# Patient Record
Sex: Female | Born: 1950 | Race: White | Hispanic: No | Marital: Single | Smoking: Never smoker
Health system: Southern US, Community
[De-identification: ages and names within clinical notes are randomized; demographics above are authoritative.]

## PROBLEM LIST (undated history)

## (undated) DIAGNOSIS — I2089 Other forms of angina pectoris: Secondary | ICD-10-CM

## (undated) DIAGNOSIS — R1900 Intra-abdominal and pelvic swelling, mass and lump, unspecified site: Secondary | ICD-10-CM

## (undated) DIAGNOSIS — M5030 Other cervical disc degeneration, unspecified cervical region: Secondary | ICD-10-CM

## (undated) DIAGNOSIS — Z9289 Personal history of other medical treatment: Secondary | ICD-10-CM

## (undated) DIAGNOSIS — N189 Chronic kidney disease, unspecified: Secondary | ICD-10-CM

## (undated) DIAGNOSIS — F419 Anxiety disorder, unspecified: Secondary | ICD-10-CM

## (undated) DIAGNOSIS — J189 Pneumonia, unspecified organism: Secondary | ICD-10-CM

## (undated) DIAGNOSIS — Z8673 Personal history of transient ischemic attack (TIA), and cerebral infarction without residual deficits: Secondary | ICD-10-CM

## (undated) DIAGNOSIS — J45909 Unspecified asthma, uncomplicated: Secondary | ICD-10-CM

## (undated) DIAGNOSIS — R0600 Dyspnea, unspecified: Secondary | ICD-10-CM

## (undated) DIAGNOSIS — K219 Gastro-esophageal reflux disease without esophagitis: Secondary | ICD-10-CM

## (undated) DIAGNOSIS — I209 Angina pectoris, unspecified: Secondary | ICD-10-CM

## (undated) DIAGNOSIS — R112 Nausea with vomiting, unspecified: Secondary | ICD-10-CM

## (undated) DIAGNOSIS — K449 Diaphragmatic hernia without obstruction or gangrene: Secondary | ICD-10-CM

## (undated) DIAGNOSIS — F32A Depression, unspecified: Secondary | ICD-10-CM

## (undated) DIAGNOSIS — I10 Essential (primary) hypertension: Secondary | ICD-10-CM

## (undated) DIAGNOSIS — J302 Other seasonal allergic rhinitis: Secondary | ICD-10-CM

## (undated) DIAGNOSIS — J329 Chronic sinusitis, unspecified: Secondary | ICD-10-CM

## (undated) DIAGNOSIS — F329 Major depressive disorder, single episode, unspecified: Secondary | ICD-10-CM

## (undated) DIAGNOSIS — M109 Gout, unspecified: Secondary | ICD-10-CM

## (undated) DIAGNOSIS — G894 Chronic pain syndrome: Secondary | ICD-10-CM

## (undated) DIAGNOSIS — Z9889 Other specified postprocedural states: Secondary | ICD-10-CM

## (undated) DIAGNOSIS — I639 Cerebral infarction, unspecified: Secondary | ICD-10-CM

## (undated) DIAGNOSIS — T7840XA Allergy, unspecified, initial encounter: Secondary | ICD-10-CM

## (undated) DIAGNOSIS — I208 Other forms of angina pectoris: Secondary | ICD-10-CM

## (undated) DIAGNOSIS — N289 Disorder of kidney and ureter, unspecified: Secondary | ICD-10-CM

## (undated) HISTORY — PX: TUBAL LIGATION: SHX77

## (undated) HISTORY — DX: Gout, unspecified: M10.9

## (undated) HISTORY — DX: Chronic sinusitis, unspecified: J32.9

## (undated) HISTORY — DX: Depression, unspecified: F32.A

## (undated) HISTORY — DX: Gastro-esophageal reflux disease without esophagitis: K21.9

## (undated) HISTORY — PX: BREAST SURGERY: SHX581

## (undated) HISTORY — DX: Chronic kidney disease, unspecified: N18.9

## (undated) HISTORY — PX: TONSILLECTOMY: SUR1361

## (undated) HISTORY — DX: Other seasonal allergic rhinitis: J30.2

## (undated) HISTORY — DX: Allergy, unspecified, initial encounter: T78.40XA

## (undated) HISTORY — DX: Other cervical disc degeneration, unspecified cervical region: M50.30

## (undated) HISTORY — PX: TONSILLECTOMY AND ADENOIDECTOMY: SUR1326

## (undated) HISTORY — DX: Chronic pain syndrome: G89.4

## (undated) HISTORY — DX: Anxiety disorder, unspecified: F41.9

## (undated) HISTORY — DX: Unspecified asthma, uncomplicated: J45.909

## (undated) HISTORY — PX: ARM DEBRIDEMENT: SHX890

## (undated) HISTORY — DX: Essential (primary) hypertension: I10

## (undated) HISTORY — DX: Personal history of transient ischemic attack (TIA), and cerebral infarction without residual deficits: Z86.73

## (undated) HISTORY — PX: APPENDECTOMY: SHX54

## (undated) HISTORY — DX: Major depressive disorder, single episode, unspecified: F32.9

---

## 1996-05-08 DIAGNOSIS — I10 Essential (primary) hypertension: Secondary | ICD-10-CM

## 1996-05-08 DIAGNOSIS — E119 Type 2 diabetes mellitus without complications: Secondary | ICD-10-CM

## 1996-05-08 HISTORY — DX: Essential (primary) hypertension: I10

## 1996-05-08 HISTORY — DX: Type 2 diabetes mellitus without complications: E11.9

## 1999-03-21 ENCOUNTER — Other Ambulatory Visit: Admission: RE | Admit: 1999-03-21 | Discharge: 1999-03-21 | Payer: Self-pay | Admitting: Family Medicine

## 1999-04-18 ENCOUNTER — Encounter: Payer: Self-pay | Admitting: Family Medicine

## 1999-04-18 ENCOUNTER — Ambulatory Visit (HOSPITAL_COMMUNITY): Admission: RE | Admit: 1999-04-18 | Discharge: 1999-04-18 | Payer: Self-pay | Admitting: Family Medicine

## 1999-11-03 ENCOUNTER — Encounter: Payer: Self-pay | Admitting: Cardiology

## 1999-11-03 ENCOUNTER — Ambulatory Visit (HOSPITAL_COMMUNITY): Admission: RE | Admit: 1999-11-03 | Discharge: 1999-11-03 | Payer: Self-pay | Admitting: Cardiology

## 2000-08-14 ENCOUNTER — Encounter: Admission: RE | Admit: 2000-08-14 | Discharge: 2000-11-12 | Payer: Self-pay | Admitting: Cardiology

## 2000-10-12 ENCOUNTER — Encounter: Payer: Self-pay | Admitting: Cardiology

## 2000-10-12 ENCOUNTER — Encounter: Admission: RE | Admit: 2000-10-12 | Discharge: 2000-10-12 | Payer: Self-pay | Admitting: Cardiology

## 2001-01-04 ENCOUNTER — Other Ambulatory Visit: Admission: RE | Admit: 2001-01-04 | Discharge: 2001-01-04 | Payer: Self-pay | Admitting: Obstetrics and Gynecology

## 2001-07-07 ENCOUNTER — Emergency Department (HOSPITAL_COMMUNITY): Admission: EM | Admit: 2001-07-07 | Discharge: 2001-07-07 | Payer: Self-pay | Admitting: Emergency Medicine

## 2001-07-07 ENCOUNTER — Encounter: Payer: Self-pay | Admitting: Emergency Medicine

## 2002-10-17 ENCOUNTER — Emergency Department (HOSPITAL_COMMUNITY): Admission: EM | Admit: 2002-10-17 | Discharge: 2002-10-17 | Payer: Self-pay | Admitting: Emergency Medicine

## 2002-11-11 ENCOUNTER — Emergency Department (HOSPITAL_COMMUNITY): Admission: EM | Admit: 2002-11-11 | Discharge: 2002-11-11 | Payer: Self-pay | Admitting: Emergency Medicine

## 2003-07-27 ENCOUNTER — Emergency Department (HOSPITAL_COMMUNITY): Admission: AD | Admit: 2003-07-27 | Discharge: 2003-07-27 | Payer: Self-pay | Admitting: Family Medicine

## 2003-12-20 ENCOUNTER — Emergency Department (HOSPITAL_COMMUNITY): Admission: EM | Admit: 2003-12-20 | Discharge: 2003-12-20 | Payer: Self-pay | Admitting: *Deleted

## 2005-05-29 ENCOUNTER — Inpatient Hospital Stay (HOSPITAL_COMMUNITY): Admission: EM | Admit: 2005-05-29 | Discharge: 2005-06-06 | Payer: Self-pay | Admitting: Emergency Medicine

## 2005-06-08 DIAGNOSIS — Z8719 Personal history of other diseases of the digestive system: Secondary | ICD-10-CM

## 2005-06-08 HISTORY — DX: Personal history of other diseases of the digestive system: Z87.19

## 2005-06-21 ENCOUNTER — Ambulatory Visit (HOSPITAL_COMMUNITY): Admission: RE | Admit: 2005-06-21 | Discharge: 2005-06-21 | Payer: Self-pay | Admitting: Cardiology

## 2005-07-06 ENCOUNTER — Ambulatory Visit: Payer: Self-pay | Admitting: Internal Medicine

## 2005-07-07 ENCOUNTER — Ambulatory Visit: Payer: Self-pay | Admitting: *Deleted

## 2005-08-21 ENCOUNTER — Encounter: Payer: Self-pay | Admitting: Internal Medicine

## 2005-08-21 ENCOUNTER — Encounter (INDEPENDENT_AMBULATORY_CARE_PROVIDER_SITE_OTHER): Payer: Self-pay | Admitting: Internal Medicine

## 2005-08-21 ENCOUNTER — Ambulatory Visit: Payer: Self-pay | Admitting: Internal Medicine

## 2005-08-22 ENCOUNTER — Encounter (INDEPENDENT_AMBULATORY_CARE_PROVIDER_SITE_OTHER): Payer: Self-pay | Admitting: Internal Medicine

## 2005-08-22 LAB — CONVERTED CEMR LAB
BUN: 42 mg/dL
Creatinine, Ser: 2 mg/dL

## 2005-08-29 ENCOUNTER — Ambulatory Visit (HOSPITAL_COMMUNITY): Admission: RE | Admit: 2005-08-29 | Discharge: 2005-08-29 | Payer: Self-pay | Admitting: Family Medicine

## 2005-10-04 ENCOUNTER — Ambulatory Visit (HOSPITAL_COMMUNITY): Admission: RE | Admit: 2005-10-04 | Discharge: 2005-10-04 | Payer: Self-pay | Admitting: Internal Medicine

## 2005-10-04 ENCOUNTER — Ambulatory Visit: Payer: Self-pay | Admitting: Internal Medicine

## 2005-10-13 ENCOUNTER — Ambulatory Visit: Payer: Self-pay | Admitting: Internal Medicine

## 2006-02-21 ENCOUNTER — Encounter (INDEPENDENT_AMBULATORY_CARE_PROVIDER_SITE_OTHER): Payer: Self-pay | Admitting: Internal Medicine

## 2006-02-21 ENCOUNTER — Ambulatory Visit: Payer: Self-pay | Admitting: Family Medicine

## 2006-02-21 DIAGNOSIS — E1149 Type 2 diabetes mellitus with other diabetic neurological complication: Secondary | ICD-10-CM | POA: Insufficient documentation

## 2006-04-12 ENCOUNTER — Ambulatory Visit: Payer: Self-pay | Admitting: Internal Medicine

## 2006-04-23 ENCOUNTER — Ambulatory Visit: Payer: Self-pay | Admitting: Internal Medicine

## 2006-08-09 ENCOUNTER — Ambulatory Visit: Payer: Self-pay | Admitting: Internal Medicine

## 2006-09-07 ENCOUNTER — Ambulatory Visit: Payer: Self-pay | Admitting: Internal Medicine

## 2006-09-18 ENCOUNTER — Ambulatory Visit: Payer: Self-pay | Admitting: Internal Medicine

## 2006-09-25 ENCOUNTER — Ambulatory Visit: Payer: Self-pay | Admitting: Internal Medicine

## 2006-10-12 ENCOUNTER — Ambulatory Visit: Payer: Self-pay | Admitting: Internal Medicine

## 2006-10-15 ENCOUNTER — Ambulatory Visit: Payer: Self-pay | Admitting: Internal Medicine

## 2006-10-16 ENCOUNTER — Ambulatory Visit: Payer: Self-pay | Admitting: Internal Medicine

## 2006-10-26 ENCOUNTER — Ambulatory Visit: Payer: Self-pay | Admitting: Internal Medicine

## 2006-11-02 ENCOUNTER — Ambulatory Visit (HOSPITAL_COMMUNITY): Admission: RE | Admit: 2006-11-02 | Discharge: 2006-11-02 | Payer: Self-pay | Admitting: Internal Medicine

## 2006-11-20 ENCOUNTER — Encounter (INDEPENDENT_AMBULATORY_CARE_PROVIDER_SITE_OTHER): Payer: Self-pay | Admitting: Internal Medicine

## 2006-11-20 DIAGNOSIS — E119 Type 2 diabetes mellitus without complications: Secondary | ICD-10-CM | POA: Insufficient documentation

## 2006-11-20 DIAGNOSIS — J309 Allergic rhinitis, unspecified: Secondary | ICD-10-CM | POA: Insufficient documentation

## 2006-11-20 DIAGNOSIS — E785 Hyperlipidemia, unspecified: Secondary | ICD-10-CM | POA: Insufficient documentation

## 2006-11-20 DIAGNOSIS — M1990 Unspecified osteoarthritis, unspecified site: Secondary | ICD-10-CM | POA: Insufficient documentation

## 2006-11-20 DIAGNOSIS — F411 Generalized anxiety disorder: Secondary | ICD-10-CM | POA: Insufficient documentation

## 2006-11-20 DIAGNOSIS — M1710 Unilateral primary osteoarthritis, unspecified knee: Secondary | ICD-10-CM

## 2006-11-20 DIAGNOSIS — N189 Chronic kidney disease, unspecified: Secondary | ICD-10-CM | POA: Insufficient documentation

## 2006-11-20 LAB — CONVERTED CEMR LAB: Cholesterol: 195 mg/dL

## 2006-11-27 ENCOUNTER — Ambulatory Visit: Payer: Self-pay | Admitting: Internal Medicine

## 2006-11-27 LAB — CONVERTED CEMR LAB: Hgb A1c MFr Bld: 6.4 %

## 2006-12-19 ENCOUNTER — Ambulatory Visit: Payer: Self-pay | Admitting: Family Medicine

## 2007-01-08 ENCOUNTER — Ambulatory Visit: Payer: Self-pay | Admitting: Internal Medicine

## 2007-01-08 LAB — CONVERTED CEMR LAB
ALT: 14 units/L (ref 0–35)
AST: 17 units/L (ref 0–37)
Albumin: 4.9 g/dL (ref 3.5–5.2)
Alkaline Phosphatase: 48 units/L (ref 39–117)
Cholesterol: 185 mg/dL (ref 0–200)
HDL: 50 mg/dL (ref >39)
Total Protein: 7.8 g/dL (ref 6.0–8.3)
Triglycerides: 114 mg/dL (ref <150)

## 2007-01-15 ENCOUNTER — Encounter (INDEPENDENT_AMBULATORY_CARE_PROVIDER_SITE_OTHER): Payer: Self-pay | Admitting: Internal Medicine

## 2007-01-23 ENCOUNTER — Telehealth (INDEPENDENT_AMBULATORY_CARE_PROVIDER_SITE_OTHER): Payer: Self-pay | Admitting: *Deleted

## 2007-01-23 ENCOUNTER — Encounter (INDEPENDENT_AMBULATORY_CARE_PROVIDER_SITE_OTHER): Payer: Self-pay | Admitting: *Deleted

## 2007-02-04 ENCOUNTER — Telehealth (INDEPENDENT_AMBULATORY_CARE_PROVIDER_SITE_OTHER): Payer: Self-pay | Admitting: Internal Medicine

## 2007-02-05 ENCOUNTER — Emergency Department (HOSPITAL_COMMUNITY): Admission: EM | Admit: 2007-02-05 | Discharge: 2007-02-05 | Payer: Self-pay | Admitting: Emergency Medicine

## 2007-03-04 ENCOUNTER — Telehealth (INDEPENDENT_AMBULATORY_CARE_PROVIDER_SITE_OTHER): Payer: Self-pay | Admitting: *Deleted

## 2007-03-05 ENCOUNTER — Ambulatory Visit: Payer: Self-pay | Admitting: Internal Medicine

## 2007-03-05 LAB — CONVERTED CEMR LAB: Blood Glucose, Fingerstick: 92

## 2007-03-17 ENCOUNTER — Emergency Department (HOSPITAL_COMMUNITY): Admission: EM | Admit: 2007-03-17 | Discharge: 2007-03-17 | Payer: Self-pay | Admitting: Emergency Medicine

## 2007-04-16 ENCOUNTER — Ambulatory Visit: Payer: Self-pay | Admitting: Internal Medicine

## 2007-04-16 DIAGNOSIS — N390 Urinary tract infection, site not specified: Secondary | ICD-10-CM | POA: Insufficient documentation

## 2007-04-16 LAB — CONVERTED CEMR LAB
Bilirubin Urine: NEGATIVE
Blood in Urine, dipstick: NEGATIVE
Glucose, Urine, Semiquant: NEGATIVE
Ketones, urine, test strip: NEGATIVE
Urobilinogen, UA: NEGATIVE
pH: 5.5

## 2007-04-17 ENCOUNTER — Encounter (INDEPENDENT_AMBULATORY_CARE_PROVIDER_SITE_OTHER): Payer: Self-pay | Admitting: Internal Medicine

## 2007-04-24 ENCOUNTER — Encounter: Admission: RE | Admit: 2007-04-24 | Discharge: 2007-04-25 | Payer: Self-pay | Admitting: Occupational Medicine

## 2007-04-25 ENCOUNTER — Ambulatory Visit: Payer: Self-pay | Admitting: Internal Medicine

## 2007-04-25 LAB — CONVERTED CEMR LAB: Blood Glucose, Fingerstick: 112

## 2007-05-10 ENCOUNTER — Encounter: Admission: RE | Admit: 2007-05-10 | Discharge: 2007-07-09 | Payer: Self-pay | Admitting: Occupational Medicine

## 2007-05-13 ENCOUNTER — Telehealth (INDEPENDENT_AMBULATORY_CARE_PROVIDER_SITE_OTHER): Payer: Self-pay | Admitting: Nurse Practitioner

## 2007-05-17 ENCOUNTER — Encounter (INDEPENDENT_AMBULATORY_CARE_PROVIDER_SITE_OTHER): Payer: Self-pay | Admitting: Internal Medicine

## 2007-05-30 ENCOUNTER — Telehealth (INDEPENDENT_AMBULATORY_CARE_PROVIDER_SITE_OTHER): Payer: Self-pay | Admitting: Internal Medicine

## 2007-06-13 ENCOUNTER — Ambulatory Visit: Payer: Self-pay | Admitting: Internal Medicine

## 2007-06-13 DIAGNOSIS — M5380 Other specified dorsopathies, site unspecified: Secondary | ICD-10-CM | POA: Insufficient documentation

## 2007-06-13 LAB — CONVERTED CEMR LAB
Bilirubin Urine: NEGATIVE
Blood Glucose, Fingerstick: 107
Glucose, Urine, Semiquant: NEGATIVE
Hgb A1c MFr Bld: 6.1 %
WBC Urine, dipstick: NEGATIVE
pH: 5

## 2007-07-26 ENCOUNTER — Encounter (INDEPENDENT_AMBULATORY_CARE_PROVIDER_SITE_OTHER): Payer: Self-pay | Admitting: Internal Medicine

## 2007-07-26 ENCOUNTER — Ambulatory Visit: Payer: Self-pay | Admitting: Internal Medicine

## 2007-07-26 DIAGNOSIS — R5383 Other fatigue: Secondary | ICD-10-CM

## 2007-07-26 DIAGNOSIS — I10 Essential (primary) hypertension: Secondary | ICD-10-CM | POA: Insufficient documentation

## 2007-07-26 DIAGNOSIS — R5381 Other malaise: Secondary | ICD-10-CM | POA: Insufficient documentation

## 2007-07-26 LAB — CONVERTED CEMR LAB
Blood Glucose, Fingerstick: 157
Nitrite: NEGATIVE
Specific Gravity, Urine: 1.02
Urobilinogen, UA: NEGATIVE
pH: 5

## 2007-07-27 ENCOUNTER — Encounter (INDEPENDENT_AMBULATORY_CARE_PROVIDER_SITE_OTHER): Payer: Self-pay | Admitting: Internal Medicine

## 2007-07-30 LAB — CONVERTED CEMR LAB
ALT: 17 units/L (ref 0–35)
Albumin: 4.5 g/dL (ref 3.5–5.2)
Alkaline Phosphatase: 45 units/L (ref 39–117)
Calcium: 9.6 mg/dL (ref 8.4–10.5)
Chloride: 105 meq/L (ref 96–112)
Eosinophils Absolute: 0.2 10*3/uL (ref 0.0–0.7)
Lymphocytes Relative: 33 % (ref 12–46)
Lymphs Abs: 2 10*3/uL (ref 0.7–4.0)
Monocytes Relative: 8 % (ref 3–12)
Neutrophils Relative %: 56 % (ref 43–77)
Platelets: 278 10*3/uL (ref 150–400)
Potassium: 6.1 meq/L — ABNORMAL HIGH (ref 3.5–5.3)
RBC: 4.5 M/uL (ref 3.87–5.11)
RDW: 13.8 % (ref 11.5–15.5)
Total Bilirubin: 0.4 mg/dL (ref 0.3–1.2)
Total Protein: 7 g/dL (ref 6.0–8.3)

## 2007-07-31 ENCOUNTER — Ambulatory Visit: Payer: Self-pay | Admitting: Internal Medicine

## 2007-08-01 ENCOUNTER — Encounter (INDEPENDENT_AMBULATORY_CARE_PROVIDER_SITE_OTHER): Payer: Self-pay | Admitting: Internal Medicine

## 2007-08-01 LAB — CONVERTED CEMR LAB: Potassium: 5.6 meq/L — ABNORMAL HIGH (ref 3.5–5.3)

## 2007-08-05 ENCOUNTER — Encounter
Admission: RE | Admit: 2007-08-05 | Discharge: 2007-08-05 | Payer: Self-pay | Admitting: Physical Medicine and Rehabilitation

## 2007-09-17 ENCOUNTER — Ambulatory Visit: Payer: Self-pay | Admitting: Internal Medicine

## 2007-11-22 ENCOUNTER — Ambulatory Visit: Payer: Self-pay | Admitting: Internal Medicine

## 2007-11-22 DIAGNOSIS — K029 Dental caries, unspecified: Secondary | ICD-10-CM | POA: Insufficient documentation

## 2007-11-22 LAB — CONVERTED CEMR LAB: Blood Glucose, Fingerstick: 97

## 2007-12-04 ENCOUNTER — Encounter (INDEPENDENT_AMBULATORY_CARE_PROVIDER_SITE_OTHER): Payer: Self-pay | Admitting: Internal Medicine

## 2007-12-09 ENCOUNTER — Encounter (INDEPENDENT_AMBULATORY_CARE_PROVIDER_SITE_OTHER): Payer: Self-pay | Admitting: Internal Medicine

## 2008-01-29 ENCOUNTER — Ambulatory Visit (HOSPITAL_COMMUNITY): Admission: RE | Admit: 2008-01-29 | Discharge: 2008-01-29 | Payer: Self-pay | Admitting: Internal Medicine

## 2008-03-09 ENCOUNTER — Ambulatory Visit: Payer: Self-pay | Admitting: Women's Health

## 2008-08-20 ENCOUNTER — Emergency Department (HOSPITAL_COMMUNITY): Admission: EM | Admit: 2008-08-20 | Discharge: 2008-08-20 | Payer: Self-pay | Admitting: Emergency Medicine

## 2008-08-21 ENCOUNTER — Inpatient Hospital Stay (HOSPITAL_COMMUNITY): Admission: EM | Admit: 2008-08-21 | Discharge: 2008-08-26 | Payer: Self-pay | Admitting: Emergency Medicine

## 2008-08-21 ENCOUNTER — Emergency Department (HOSPITAL_COMMUNITY): Admission: EM | Admit: 2008-08-21 | Discharge: 2008-08-21 | Payer: Self-pay | Admitting: Emergency Medicine

## 2008-08-22 ENCOUNTER — Ambulatory Visit: Payer: Self-pay | Admitting: Infectious Diseases

## 2008-08-22 ENCOUNTER — Encounter (INDEPENDENT_AMBULATORY_CARE_PROVIDER_SITE_OTHER): Payer: Self-pay | Admitting: Cardiovascular Disease

## 2008-08-24 ENCOUNTER — Ambulatory Visit: Payer: Self-pay | Admitting: *Deleted

## 2008-08-24 ENCOUNTER — Encounter (INDEPENDENT_AMBULATORY_CARE_PROVIDER_SITE_OTHER): Payer: Self-pay | Admitting: Cardiovascular Disease

## 2008-10-02 ENCOUNTER — Other Ambulatory Visit: Admission: RE | Admit: 2008-10-02 | Discharge: 2008-10-02 | Payer: Self-pay | Admitting: Gynecology

## 2008-10-02 ENCOUNTER — Ambulatory Visit: Payer: Self-pay | Admitting: Women's Health

## 2008-10-02 ENCOUNTER — Encounter: Payer: Self-pay | Admitting: Women's Health

## 2009-06-28 ENCOUNTER — Ambulatory Visit: Payer: Self-pay | Admitting: Women's Health

## 2009-08-02 ENCOUNTER — Emergency Department (HOSPITAL_COMMUNITY): Admission: EM | Admit: 2009-08-02 | Discharge: 2009-08-02 | Payer: Self-pay | Admitting: Family Medicine

## 2009-08-30 ENCOUNTER — Encounter: Admission: RE | Admit: 2009-08-30 | Discharge: 2009-08-30 | Payer: Self-pay | Admitting: Gynecology

## 2009-09-03 ENCOUNTER — Encounter: Admission: RE | Admit: 2009-09-03 | Discharge: 2009-09-03 | Payer: Self-pay | Admitting: Gynecology

## 2009-10-15 ENCOUNTER — Other Ambulatory Visit: Admission: RE | Admit: 2009-10-15 | Discharge: 2009-10-15 | Payer: Self-pay | Admitting: Gynecology

## 2009-10-15 ENCOUNTER — Ambulatory Visit: Payer: Self-pay | Admitting: Women's Health

## 2010-01-17 ENCOUNTER — Ambulatory Visit: Payer: Self-pay | Admitting: Gynecology

## 2010-01-24 ENCOUNTER — Ambulatory Visit: Payer: Self-pay | Admitting: Gynecology

## 2010-03-07 ENCOUNTER — Ambulatory Visit: Payer: Self-pay | Admitting: Gynecology

## 2010-05-30 ENCOUNTER — Encounter: Payer: Self-pay | Admitting: Internal Medicine

## 2010-08-17 LAB — POCT I-STAT, CHEM 8
BUN: 21 mg/dL (ref 6–23)
Chloride: 111 mEq/L (ref 96–112)
Sodium: 139 mEq/L (ref 135–145)

## 2010-08-17 LAB — URINALYSIS, ROUTINE W REFLEX MICROSCOPIC
Bilirubin Urine: NEGATIVE
Bilirubin Urine: NEGATIVE
Hgb urine dipstick: NEGATIVE
Ketones, ur: NEGATIVE mg/dL
Nitrite: NEGATIVE
Nitrite: NEGATIVE
Specific Gravity, Urine: 1.016 (ref 1.005–1.030)
Urobilinogen, UA: 0.2 mg/dL (ref 0.0–1.0)
pH: 6.5 (ref 5.0–8.0)

## 2010-08-17 LAB — GLUCOSE, CAPILLARY
Glucose-Capillary: 113 mg/dL — ABNORMAL HIGH (ref 70–99)
Glucose-Capillary: 123 mg/dL — ABNORMAL HIGH (ref 70–99)
Glucose-Capillary: 96 mg/dL (ref 70–99)

## 2010-08-17 LAB — DIFFERENTIAL
Eosinophils Absolute: 0 10*3/uL (ref 0.0–0.7)
Eosinophils Relative: 5 % (ref 0–5)
Eosinophils Relative: 1 % (ref 0–5)
Lymphocytes Relative: 31 % (ref 12–46)
Lymphocytes Relative: 17 % (ref 12–46)
Lymphs Abs: 1.3 10*3/uL (ref 0.7–4.0)
Lymphs Abs: 1.5 10*3/uL (ref 0.7–4.0)
Lymphs Abs: 2.5 10*3/uL (ref 0.7–4.0)
Lymphs Abs: 1.8 10*3/uL (ref 0.7–4.0)
Monocytes Absolute: 0.7 10*3/uL (ref 0.1–1.0)
Monocytes Absolute: 0.8 10*3/uL (ref 0.1–1.0)
Monocytes Absolute: 0.6 10*3/uL (ref 0.1–1.0)
Monocytes Relative: 7 % (ref 3–12)
Monocytes Relative: 9 % (ref 3–12)
Neutro Abs: 9 10*3/uL — ABNORMAL HIGH (ref 1.7–7.7)
Neutro Abs: 9.4 10*3/uL — ABNORMAL HIGH (ref 1.7–7.7)
Neutro Abs: 8 10*3/uL — ABNORMAL HIGH (ref 1.7–7.7)
Neutrophils Relative %: 79 % — ABNORMAL HIGH (ref 43–77)
Neutrophils Relative %: 80 % — ABNORMAL HIGH (ref 43–77)

## 2010-08-17 LAB — COMPREHENSIVE METABOLIC PANEL
ALT: 21 U/L (ref 0–35)
AST: 22 U/L (ref 0–37)
BUN: 21 mg/dL (ref 6–23)
BUN: 27 mg/dL — ABNORMAL HIGH (ref 6–23)
CO2: 24 mEq/L (ref 19–32)
CO2: 26 mEq/L (ref 19–32)
Calcium: 9.3 mg/dL (ref 8.4–10.5)
Calcium: 9.1 mg/dL (ref 8.4–10.5)
Chloride: 110 mEq/L (ref 96–112)
Creatinine, Ser: 1.43 mg/dL — ABNORMAL HIGH (ref 0.4–1.2)
Creatinine, Ser: 1.49 mg/dL — ABNORMAL HIGH (ref 0.4–1.2)
GFR calc Af Amer: 44 mL/min — ABNORMAL LOW (ref >60)
GFR calc non Af Amer: 38 mL/min — ABNORMAL LOW (ref >60)
GFR calc non Af Amer: 36 mL/min — ABNORMAL LOW (ref >60)
Glucose, Bld: 131 mg/dL — ABNORMAL HIGH (ref 70–99)
Sodium: 140 mEq/L (ref 135–145)
Total Bilirubin: 0.6 mg/dL (ref 0.3–1.2)
Total Protein: 6.6 g/dL (ref 6.0–8.3)

## 2010-08-17 LAB — CBC
HCT: 36.6 % (ref 36.0–46.0)
HCT: 37.4 % (ref 36.0–46.0)
HCT: 37.9 % (ref 36.0–46.0)
Hemoglobin: 12.2 g/dL (ref 12.0–15.0)
Hemoglobin: 12.3 g/dL (ref 12.0–15.0)
Hemoglobin: 12.4 g/dL (ref 12.0–15.0)
Hemoglobin: 12.7 g/dL (ref 12.0–15.0)
MCHC: 32.2 g/dL (ref 30.0–36.0)
MCHC: 34.8 g/dL (ref 30.0–36.0)
MCV: 84.7 fL (ref 78.0–100.0)
MCV: 85 fL (ref 78.0–100.0)
MCV: 85.1 fL (ref 78.0–100.0)
MCV: 85.2 fL (ref 78.0–100.0)
MCV: 84.1 fL (ref 78.0–100.0)
Platelets: 263 10*3/uL (ref 150–400)
Platelets: 264 10*3/uL (ref 150–400)
RBC: 4.27 MIL/uL (ref 3.87–5.11)
RBC: 4.4 MIL/uL (ref 3.87–5.11)
RBC: 4.35 MIL/uL (ref 3.87–5.11)
RDW: 12.8 % (ref 11.5–15.5)
RDW: 13.1 % (ref 11.5–15.5)
WBC: 11.3 10*3/uL — ABNORMAL HIGH (ref 4.0–10.5)
WBC: 8 10*3/uL (ref 4.0–10.5)
WBC: 9.6 10*3/uL (ref 4.0–10.5)
WBC: 10.6 10*3/uL — ABNORMAL HIGH (ref 4.0–10.5)

## 2010-08-17 LAB — HEMOGLOBIN A1C
Hgb A1c MFr Bld: 5.8 % (ref 4.6–6.1)
Hgb A1c MFr Bld: 6.1 % (ref 4.6–6.1)

## 2010-08-17 LAB — BASIC METABOLIC PANEL
BUN: 20 mg/dL (ref 6–23)
BUN: 20 mg/dL (ref 6–23)
Calcium: 8.7 mg/dL (ref 8.4–10.5)
Chloride: 103 mEq/L (ref 96–112)
Creatinine, Ser: 1.2 mg/dL (ref 0.4–1.2)
Creatinine, Ser: 1.49 mg/dL — ABNORMAL HIGH (ref 0.4–1.2)
GFR calc non Af Amer: 36 mL/min — ABNORMAL LOW (ref >60)
GFR calc non Af Amer: 47 mL/min — ABNORMAL LOW (ref >60)
Glucose, Bld: 133 mg/dL — ABNORMAL HIGH (ref 70–99)

## 2010-08-17 LAB — URINE CULTURE: Colony Count: >=100000

## 2010-08-17 LAB — CULTURE, ROUTINE-ABSCESS

## 2010-08-17 LAB — LIPID PANEL
LDL Cholesterol: 96 mg/dL (ref 0–99)
VLDL: 17 mg/dL (ref 0–40)

## 2010-08-17 LAB — ANAEROBIC CULTURE

## 2010-09-20 NOTE — Op Note (Signed)
NAMEDONDI, BURANDT                ACCOUNT NO.:  [id number]   MEDICAL RECORD NO.:  [id number]          PATIENT TYPE:  INP   LOCATION:  5528                         FACILITY:  [facility]   PHYSICIAN:  Ardeth Sportsman, MD     DATE OF BIRTH:  08/17/1954   DATE OF PROCEDURE:  08/25/2008  DATE OF DISCHARGE:                               OPERATIVE REPORT   PRIMARY CARE PHYSICIAN:  Osvaldo Shipper. Spruill, MD   SURGEON:  Ardeth Sportsman, MD   INFECTIOUS DISEASE:  Lacretia Leigh. Ninetta Lights, MD   PREOPERATIVE DIAGNOSIS:  Left upper extremity cellulitis with fluctuance  and probable abscess.   POSTOPERATIVE DIAGNOSIS:  Left proximal arm subcutaneous abscess with  cellulitis.   PROCEDURE PERFORMED:  Incision and drainage of the left upper arm  abscess.   ANESTHESIA:  1. Moderate to deep sedation with laryngeal mask airway.  2. Local anesthetic and a field block.   SPECIMEN:  Left arm abscess for aerobic and anaerobic culture.   DRAINS:  None.   ESTIMATED BLOOD LOSS:  Less than 10 mL.   COMPLICATIONS:  None apparent.   INDICATIONS:  Ms. Prickett is a 60 year old diabetic female who was  admitted 4 days ago with left arm cellulitis and pain.  She was  initially started on IV vancomycin and Zosyn, given the prevalence of  MRSA in the community.  She initially had some __________ in the  cellulitis began to have a more fluctuant mass on the left proximal arm.  There was some question of an insect bite and though this was not well  documented.  Given the fact that despite the improvement in cellulitis,  the mass has increased in size.   Pathophysiology of abscess was discussed.  Differential diagnosis was  discussed.  Options was discussed.  Recommendations was made for  incision and drainage.  She is exquisitely tender and thus not able to  be done at the bedside, therefore we will do this with moderate to deep  sedation in the operating room.  Risks, benefits, and alternatives were  discussed.   Questions were answered.  She agreed to proceed.   OPERATIVE FINDINGS:  She had about 10 mL of frank pus and encountered a  cavity that was about 3 x 4 x 5 cm in size but no definite cystic mass.  After debridement, the wound was cleaned.  Cellulitis had mostly  resolved.  There did not seem to be any connecting pockets, seemed to be  large one single pocket.   DESCRIPTION OF THE PROCEDURE:  Informed consent was confirmed.  The  patient continued on IV vancomycin.  She underwent a deep sedation with  laryngeal mask airway without any difficulty.  Her left axilla, chest,  and proximal arm were prepped and draped in a sterile fashion.   I used an 18 gauge needle and aspirated several milliliters of frank pus  and this was sent for culture.  An incision was made over the most  fluctuant area about 1 cm and released a large pocket of pus.  I  extended the incision to about  3 cm to have a good open cavity.  Copious  irrigation was done with several 100 mL of saline with clear return.  Some necrotic fat was debrided until there was healthy tissue around.  They did not really encounter a good cyst or tumor or any other concern.  I went ahead and packed the wound with a lightly Betadine-soaked 4 x 4  gauze to fill the cavity.  Sterile dressings applied.  The patient was  extubated and sent to recovery room in stable condition.   I am about to explain the findings to the patient's better half.      Ardeth Sportsman, MD  Electronically Signed     SCG/MEDQ  D:  08/25/2008  T:  08/26/2008  Job:  [id number]   cc:   Osvaldo Shipper. Spruill, M.D.

## 2010-09-20 NOTE — Consult Note (Signed)
Dana Romero, Dana Romero                ACCOUNT NO.:  [id number]   MEDICAL RECORD NO.:  [id number]          PATIENT TYPE:  INP   LOCATION:  5528                         FACILITY:  [facility]   PHYSICIAN:  Maisie Fus A. Cornett, M.D.DATE OF BIRTH:  08/17/1954   DATE OF CONSULTATION:  08/23/2008  DATE OF DISCHARGE:                                 CONSULTATION   REQUESTING PHYSICIAN:  Dr. Rowland Lathe.   REASON FOR CONSULTATION:  Left arm cellulitis.   HISTORY OF PRESENT ILLNESS:  The patient is a 60 year old female  admitted on August 21, 2008, due to the left arm fullness and redness.  She is being worked up for gallstones but developed left arm discomfort.  She was examined, she had a significant area of cellulitis involving the  inner aspect of her left upper arm and what appeared to be a small  bite.  She is not aware of being bit by anything but it is what it  looks like, she stated.  She is admitted for IV vancomycin and Rocephin  and has had dramatic improvement of her cellulitis over the last 24-48  hours.  She has a small central area where the bite was and had some  mild fluctuance.  We were asked to see her at the request of Dr. Rowland Lathe  in consultation to see if incision and drainage would be warranted.  Denies any fever chills, nausea, vomiting or abdominal pain.  The pain  is now much improved on antibiotics, actually the discomfort she was  feeling earlier is much better.   PAST MEDICAL HISTORY:  Type 2 diabetes mellitus and hypertension.   PAST SURGICAL HISTORY:  Appendectomy.   MEDICATIONS:  1. Actos 30 mg a day.  2. Hyzaar 100/25 daily.  3. Potassium chloride 10 mEq daily.  4. Xanax for sleep.  5. Nexium 40 mg daily.   ALLERGIES:  None.   FAMILY HISTORY:  Noncontributory.   REVIEW OF SYSTEMS:  Denies fever, chills, nausea, and vomiting.  Complains of left upper arm discomfort and tenderness.  Otherwise, 15-  point review of systems is negative.   PHYSICAL EXAMINATION:  VITAL  SIGNS:  Her temperature is 96, pulse 76,  blood pressure is 170/76.  GENERAL APPEARANCE:  This is a pleasant female, in no apparent distress.  HEENT:  No evidence of jaundice.  Extraocular movements appear intact.  Oropharynx is moist.  EXTREMITIES:  Left upper extremity, in the inner aspect of her left arm,  there is a 0.2 cm area which is mildly fluctuant.  It is not tense, but  it is mildly tender and erythematous.  Around this is an infarct  extending out for about 15 cm.  There is no significant erythema in this  area.  This was demarcated where her erythema was at admission, 24 hours  ago.  She is not having lymphadenitis.  NEUROLOGIC:  She is alert and oriented.   IMPRESSION:  Left arm cellulitis with small 1-2 cm area of mild  fluctuance.   PLAN:  I would not I and D this at this point.  We will reassess  her  tomorrow to see if this area is going to continue to resolve.  If not,  she may require incision and drainage .      Thomas A. Cornett, M.D.  Electronically Signed     TAC/MEDQ  D:  08/23/2008  T:  08/24/2008  Job:  [id number]   cc:   ________

## 2010-09-23 NOTE — Discharge Summary (Signed)
NAMECARROLYN, HILMES                ACCOUNT NO.:  [id number]   MEDICAL RECORD NO.:  [id number]          PATIENT TYPE:  INP   LOCATION:  6743                         FACILITY:  [facility]   PHYSICIAN:  Osvaldo Shipper. Spruill, M.D.DATE OF BIRTH:  08/17/1954   DATE OF ADMISSION:  05/29/2005  DATE OF DISCHARGE:  06/06/2005                                 DISCHARGE SUMMARY   DISCHARGE DIAGNOSES:  1.  Acute pancreatitis.  2.  Alcohol abuse.  3.  Alcohol withdrawal.  4.  Thrombocytopenia.  5.  Non-insulin-dependent diabetes mellitus.  6.  Hypertension.  7.  Anxiety.  8.  Esophageal reflux.   Ms. Orellana is a 60 year old patient who presented initially to the emergency  department at [facility] with a complaint of abdominal pain.  The  patient was brought to the emergency department by EMS with a complaint of  abdominal pain that was getting progressively worse with vomiting.  The  patient admits to alcohol usage 2-3 days ago.  She is now unable to keep  anything down.  Evaluation in the emergency department revealed evidence of  pancreatitis.  The patient subsequently admitted for aggressive treatment of  this problem.  On May 31, 2005, the patient was still having problems  with vomiting.  This was improved with antiemetics.  The patient was noted  to be anemic and received a unit of packed cells with this improving the  hemoglobin up to 9.  On June 04, 2005, the activity was able to be  increased.  The lipase and amylase were gradually improving.  And, on  June 06, 2005, it was the opinion that the patient had received maximum  benefit from this hospitalization and could be discharged home.   DISCHARGE MEDICATIONS:  1.  Hyzaar 100/12.5 mg.  2.  Potassium one daily.  3.  Actos daily.  4.  Protonix daily.  5.  Thiamine 100 mg daily.  6.  Restoril 30 mg at bedtime.  7.  Xanax 1 mg three times a day.  8.  Claritin 10 mg daily.   Plans were also made for the patient to be  evaluated by alcohol rehab  program as an outpatient.      [name], P.A.      Osvaldo Shipper. Spruill, M.D.  Electronically Signed    HB/MEDQ  D:  07/18/2005  T:  07/19/2005  Job:  [id number]

## 2010-09-23 NOTE — Discharge Summary (Signed)
NAMEYAMIL, Dana Romero                ACCOUNT NO.:  [id number]   MEDICAL RECORD NO.:  [id number]          Romero TYPE:  INP   LOCATION:  5528                         FACILITY:  [facility]   PHYSICIAN:  Osvaldo Shipper. Spruill, M.D.DATE OF BIRTH:  08/17/1954   DATE OF ADMISSION:  08/21/2008  DATE OF DISCHARGE:  08/26/2008                               DISCHARGE SUMMARY   DISCHARGE DIAGNOSES:  1. Cellulitis of Dana arm.  2. Methicillin-resistant staph aureus.  3. Hypertension.  4. Type 2 diabetes mellitus.  5. Esophageal reflux disease.   HISTORY OF PRESENT ILLNESS:  Dana Romero is a 60 year old Romero who  presented initially to [facility] emergency department of [facility] after noting that a spider bite was expanding.  Dana Romero  complained of feeling that her arm felt heavy and her chest felt a  little tight, however, she admitted that she may have had an anxiety  attack due to Dana area of pain and Dana increase in Dana size.  Dana  Romero was evaluated and subsequently admitted.   Dana admission history and physical was completed by Dr. Algie Coffer.  Dana  daily visits were completed by Dr. Donia Guiles.  Dana Romero was  treated with IV Rocephin and a Infectious Disease consultation was  obtained.  It was their opinion that Dana Romero should be isolated and  that vancomycin should be used.   A surgical consultation was obtained with Dr. Luisa Hart.  It was his  opinion to continue IV antibiotics.  He did note, however, that 85-90%  of her erythema was improved and suggested a wait-and-see approach at  this time.   Dana Romero had left upper extremity venous and arterial duplex study.  This was negative for DVT and had a triphasic waveform.   On August 25, 2008, Dana Romero's cellulitis had decreased but now there  was some evidence of abscess and plans were made for incision and  drainage.   On August 25, 2008, Dana Romero underwent an incision and drainage with  culture and  sensitivity by Dana surgical team.  Dana Romero tolerated Dana  procedure without problem.   Plans were made in for Dana Romero to be discharged home.  Dana Romero's  antibiotic was changed to clindamycin as she was sensitive to Bactrim.  She is to followup in Dana office.  She is to change her packing and  dressing change daily.  She is to see Dr. Michaell Cowing at [facility].  She is to call for an appointment.  She is to be seen by Dr.  Shana Chute in 1 week.  She is placed on Actos 30 mg daily, Hyzaar 150/25  daily, potassium 10 mEq daily, Xanax 1 mg 3 times a day, Zetia 20 mg  daily, Nexium 40 mg daily, Restoril 30 mg at bedtime, and clindamycin  300 mg t.i.d.  Dana Romero is to notify Dana physician immediately if any  changes, problems, or concerns.      [name], P.A.      Osvaldo Shipper. Spruill, M.D.  Electronically Signed    HB/MEDQ  D:  10/25/2008  T:  10/26/2008  Job:  [id number]

## 2010-10-17 ENCOUNTER — Encounter (INDEPENDENT_AMBULATORY_CARE_PROVIDER_SITE_OTHER): Payer: 59 | Admitting: Women's Health

## 2010-10-17 ENCOUNTER — Other Ambulatory Visit (HOSPITAL_COMMUNITY)
Admission: RE | Admit: 2010-10-17 | Discharge: 2010-10-17 | Disposition: A | Discharge status: Home / Self Care | Payer: 59 | Source: Ambulatory Visit | Attending: Gynecology | Admitting: Gynecology

## 2010-10-17 ENCOUNTER — Other Ambulatory Visit: Payer: Self-pay | Admitting: Women's Health

## 2010-10-17 DIAGNOSIS — Z124 Encounter for screening for malignant neoplasm of cervix: Secondary | ICD-10-CM | POA: Insufficient documentation

## 2010-10-17 DIAGNOSIS — Z01419 Encounter for gynecological examination (general) (routine) without abnormal findings: Secondary | ICD-10-CM

## 2010-10-17 DIAGNOSIS — M810 Age-related osteoporosis without current pathological fracture: Secondary | ICD-10-CM

## 2010-12-19 ENCOUNTER — Telehealth: Payer: Self-pay | Admitting: *Deleted

## 2010-12-19 NOTE — Telephone Encounter (Signed)
Patient wanted to talk to you regarding ? Prolapse. Asks if you can call her. Thanks

## 2010-12-21 ENCOUNTER — Telehealth: Payer: Self-pay | Admitting: Women's Health

## 2010-12-22 ENCOUNTER — Telehealth: Payer: Self-pay | Admitting: Women's Health

## 2010-12-22 NOTE — Telephone Encounter (Signed)
Numerous phone calls unable to reach by phone, instructed to call if continued problems.Marland Kitchen

## 2010-12-22 NOTE — Telephone Encounter (Signed)
Left message to call.

## 2010-12-27 NOTE — Telephone Encounter (Signed)
Harriett Sine left several messages pt never returned call. She other telephone encounters

## 2011-01-11 ENCOUNTER — Inpatient Hospital Stay: Admission: RE | Admit: 2011-01-11 | Payer: Self-pay | Source: Ambulatory Visit

## 2011-01-11 ENCOUNTER — Ambulatory Visit (INDEPENDENT_AMBULATORY_CARE_PROVIDER_SITE_OTHER): Payer: 59 | Admitting: Gynecology

## 2011-01-11 DIAGNOSIS — M899 Disorder of bone, unspecified: Secondary | ICD-10-CM

## 2011-01-11 DIAGNOSIS — N958 Other specified menopausal and perimenopausal disorders: Secondary | ICD-10-CM

## 2011-01-11 DIAGNOSIS — M949 Disorder of cartilage, unspecified: Secondary | ICD-10-CM

## 2011-01-29 ENCOUNTER — Emergency Department (HOSPITAL_COMMUNITY): Payer: 59

## 2011-01-29 ENCOUNTER — Inpatient Hospital Stay (HOSPITAL_COMMUNITY)
Admission: EM | Admit: 2011-01-29 | Discharge: 2011-01-31 | DRG: 313 | Disposition: A | Discharge status: Home / Self Care | Payer: 59 | Attending: Cardiology | Admitting: Cardiology

## 2011-01-29 DIAGNOSIS — F411 Generalized anxiety disorder: Secondary | ICD-10-CM | POA: Diagnosis present

## 2011-01-29 DIAGNOSIS — K219 Gastro-esophageal reflux disease without esophagitis: Secondary | ICD-10-CM | POA: Diagnosis present

## 2011-01-29 DIAGNOSIS — F3289 Other specified depressive episodes: Secondary | ICD-10-CM | POA: Diagnosis present

## 2011-01-29 DIAGNOSIS — E119 Type 2 diabetes mellitus without complications: Secondary | ICD-10-CM | POA: Diagnosis present

## 2011-01-29 DIAGNOSIS — I129 Hypertensive chronic kidney disease with stage 1 through stage 4 chronic kidney disease, or unspecified chronic kidney disease: Secondary | ICD-10-CM | POA: Diagnosis present

## 2011-01-29 DIAGNOSIS — D638 Anemia in other chronic diseases classified elsewhere: Secondary | ICD-10-CM | POA: Diagnosis present

## 2011-01-29 DIAGNOSIS — F329 Major depressive disorder, single episode, unspecified: Secondary | ICD-10-CM | POA: Diagnosis present

## 2011-01-29 DIAGNOSIS — N189 Chronic kidney disease, unspecified: Secondary | ICD-10-CM | POA: Diagnosis present

## 2011-01-29 DIAGNOSIS — Z7982 Long term (current) use of aspirin: Secondary | ICD-10-CM

## 2011-01-29 DIAGNOSIS — I959 Hypotension, unspecified: Secondary | ICD-10-CM | POA: Diagnosis present

## 2011-01-29 DIAGNOSIS — R079 Chest pain, unspecified: Principal | ICD-10-CM | POA: Diagnosis present

## 2011-01-29 DIAGNOSIS — E7800 Pure hypercholesterolemia, unspecified: Secondary | ICD-10-CM | POA: Diagnosis present

## 2011-01-29 LAB — GLUCOSE, CAPILLARY
Glucose-Capillary: 126 mg/dL — ABNORMAL HIGH (ref 70–99)
Glucose-Capillary: 95 mg/dL (ref 70–99)

## 2011-01-29 LAB — HEMOGLOBIN A1C
Hgb A1c MFr Bld: 6.4 % — ABNORMAL HIGH (ref <5.7)
Mean Plasma Glucose: 137 mg/dL — ABNORMAL HIGH (ref <117)

## 2011-01-29 LAB — POCT I-STAT, CHEM 8
BUN: 43 mg/dL — ABNORMAL HIGH (ref 6–23)
Calcium, Ion: 1.15 mmol/L (ref 1.12–1.32)
Chloride: 103 mEq/L (ref 96–112)
Creatinine, Ser: 1.8 mg/dL — ABNORMAL HIGH (ref 0.50–1.10)
Glucose, Bld: 149 mg/dL — ABNORMAL HIGH (ref 70–99)
HCT: 37 % (ref 36.0–46.0)
Potassium: 4.6 mEq/L (ref 3.5–5.1)

## 2011-01-29 LAB — POCT I-STAT TROPONIN I: Troponin i, poc: 0 ng/mL (ref 0.00–0.08)

## 2011-01-29 LAB — APTT: aPTT: 89 seconds — ABNORMAL HIGH (ref 24–37)

## 2011-01-29 LAB — DIFFERENTIAL
Basophils Absolute: 0 10*3/uL (ref 0.0–0.1)
Eosinophils Relative: 3 % (ref 0–5)
Lymphocytes Relative: 19 % (ref 12–46)
Lymphs Abs: 1.9 10*3/uL (ref 0.7–4.0)
Neutro Abs: 6.9 10*3/uL (ref 1.7–7.7)

## 2011-01-29 LAB — PROTIME-INR
INR: 1.18 (ref 0.00–1.49)
Prothrombin Time: 15.2 seconds (ref 11.6–15.2)

## 2011-01-29 LAB — COMPREHENSIVE METABOLIC PANEL
Alkaline Phosphatase: 40 U/L (ref 39–117)
BUN: 37 mg/dL — ABNORMAL HIGH (ref 6–23)
Chloride: 102 mEq/L (ref 96–112)
Creatinine, Ser: 1.51 mg/dL — ABNORMAL HIGH (ref 0.50–1.10)
GFR calc Af Amer: 43 mL/min — ABNORMAL LOW (ref >60)
Glucose, Bld: 171 mg/dL — ABNORMAL HIGH (ref 70–99)
Potassium: 4 mEq/L (ref 3.5–5.1)
Total Bilirubin: 0.3 mg/dL (ref 0.3–1.2)
Total Protein: 6.4 g/dL (ref 6.0–8.3)

## 2011-01-29 LAB — CBC
HCT: 35 % — ABNORMAL LOW (ref 36.0–46.0)
HCT: 35.1 % — ABNORMAL LOW (ref 36.0–46.0)
Hemoglobin: 11.6 g/dL — ABNORMAL LOW (ref 12.0–15.0)
Hemoglobin: 11.7 g/dL — ABNORMAL LOW (ref 12.0–15.0)
MCHC: 33.3 g/dL (ref 30.0–36.0)
MCV: 83.5 fL (ref 78.0–100.0)
RBC: 4.19 MIL/uL (ref 3.87–5.11)
WBC: 9.8 10*3/uL (ref 4.0–10.5)
WBC: 9.2 10*3/uL (ref 4.0–10.5)

## 2011-01-29 LAB — IRON AND TIBC: Iron: 51 ug/dL (ref 42–135)

## 2011-01-29 LAB — HEPARIN LEVEL (UNFRACTIONATED): Heparin Unfractionated: 0.29 IU/mL — ABNORMAL LOW (ref 0.30–0.70)

## 2011-01-29 LAB — CK TOTAL AND CKMB (NOT AT ARMC): CK, MB: 6 ng/mL — ABNORMAL HIGH (ref 0.3–4.0)

## 2011-01-29 LAB — FERRITIN: Ferritin: 113 ng/mL (ref 10–291)

## 2011-01-30 ENCOUNTER — Inpatient Hospital Stay (HOSPITAL_COMMUNITY): Payer: 59

## 2011-01-30 LAB — BASIC METABOLIC PANEL
BUN: 38 mg/dL — ABNORMAL HIGH (ref 6–23)
Calcium: 8.9 mg/dL (ref 8.4–10.5)
GFR calc non Af Amer: 36 mL/min — ABNORMAL LOW (ref >60)
Glucose, Bld: 125 mg/dL — ABNORMAL HIGH (ref 70–99)
Sodium: 137 mEq/L (ref 135–145)

## 2011-01-30 LAB — LIPID PANEL
Cholesterol: 157 mg/dL (ref 0–200)
Total CHOL/HDL Ratio: 2.7 RATIO
Triglycerides: 62 mg/dL (ref <150)
VLDL: 12 mg/dL (ref 0–40)

## 2011-01-30 LAB — CBC
HCT: 33.6 % — ABNORMAL LOW (ref 36.0–46.0)
MCHC: 33.3 g/dL (ref 30.0–36.0)
Platelets: 211 10*3/uL (ref 150–400)
RDW: 13 % (ref 11.5–15.5)

## 2011-01-30 LAB — CARDIAC PANEL(CRET KIN+CKTOT+MB+TROPI)
CK, MB: 4.6 ng/mL — ABNORMAL HIGH (ref 0.3–4.0)
Relative Index: INVALID (ref 0.0–2.5)
Total CK: 97 U/L (ref 7–177)
Total CK: 157 U/L (ref 7–177)
Troponin I: <0.3 ng/mL (ref <0.30)

## 2011-01-30 LAB — HEPARIN LEVEL (UNFRACTIONATED): Heparin Unfractionated: <0.1 IU/mL — ABNORMAL LOW (ref 0.30–0.70)

## 2011-01-30 LAB — GLUCOSE, CAPILLARY
Glucose-Capillary: 120 mg/dL — ABNORMAL HIGH (ref 70–99)
Glucose-Capillary: 132 mg/dL — ABNORMAL HIGH (ref 70–99)
Glucose-Capillary: 154 mg/dL — ABNORMAL HIGH (ref 70–99)

## 2011-01-30 MED ORDER — TECHNETIUM TC 99M TETROFOSMIN IV KIT
10.0000 | PACK | Freq: Once | INTRAVENOUS | Status: AC | PRN
  Administered 2011-01-30: 10 via INTRAVENOUS

## 2011-01-30 MED ORDER — TECHNETIUM TC 99M TETROFOSMIN IV KIT
30.0000 | PACK | Freq: Once | INTRAVENOUS | Status: AC | PRN
  Administered 2011-01-30: 30 via INTRAVENOUS

## 2011-01-31 LAB — GLUCOSE, CAPILLARY: Glucose-Capillary: 106 mg/dL — ABNORMAL HIGH (ref 70–99)

## 2011-02-09 ENCOUNTER — Encounter: Payer: Self-pay | Admitting: Gynecology

## 2011-02-15 NOTE — Cardiovascular Report (Signed)
  NAMESHAWNIA, VIZCARRONDO                ACCOUNT NO.:  [id number]  MEDICAL RECORD NO.:  [id number]  LOCATION:  3735                         FACILITY:  [facility]  PHYSICIAN:  Eduardo Osier. Sharyn Lull, M.D. DATE OF BIRTH:  08/17/1954  DATE OF PROCEDURE: DATE OF DISCHARGE:  01/31/2011                           CARDIAC CATHETERIZATION   ADMITTING DIAGNOSES:  Chest pain, rule out myocardial infarction, hypertension, non-insulin-dependent diabetes mellitus controlled by diet, hypercholesteremia, chronic kidney disease, depression, anxiety disorder, anemia of chronic disease, mild prerenal azotemia.  DISCHARGE DIAGNOSES:  Status post chest pain, myocardial infarction ruled out, negative Lexiscan Myoview, hypertension, non-insulin- dependent diabetes mellitus controlled by diet, hypercholesteremia, chronic kidney disease improved status post mild prerenal azotemia, depression, anxiety disorder, anemia of chronic disease.  DISCHARGE HOME MEDICATIONS: 1. Enteric-coated aspirin 81 mg 1 tablet daily. 2. Cozaar 100 mg 1 tablet daily. 3. Metoprolol 25 mg 1 tablet twice daily. 4. Allegra 1 tablet daily as needed. 5. Amitriptyline 25 mg 1 tablet every evening. 6. Benadryl 25 mg 1 tablet daily at bedtime. 7. Calcium 1/2 tablet twice daily. 8. Lovaza 1 gram 1 capsule daily. 9. Magnesium oxide 1/2 tablet 4 times daily as before. 10.Multivitamin 1/2 tablet twice daily. 11.Nexium 40 mg 1 capsule daily. 12.Vitamin B complex 1 tablet daily. 13.Xanax 1 mg daily at night as before. 14.Zetia 10 mg 1 tablet every evening. 15.Zoloft 100 mg 1 tablet every day as before. 16.The patient has been advised to stop Tekturna and her Hyzaar has     been changed to Cozaar.  DIET:  Low-salt, low cholesterol, 1800 calories ADA.     Eduardo Osier. Sharyn Lull, M.D.     MNH/MEDQ  D:  01/31/2011  T:  01/31/2011  Job:  [id number]  Electronically Signed by Rinaldo Cloud M.D. on 02/15/2011 07:51:35 PM

## 2011-02-15 NOTE — Discharge Summary (Signed)
Dana Romero, Dana Romero                ACCOUNT NO.:  [id number]  MEDICAL RECORD NO.:  [id number]  LOCATION:  3735                         FACILITY:  [facility]  PHYSICIAN:  Eduardo Osier. Sharyn Lull, M.D. DATE OF BIRTH:  08/17/1954  DATE OF ADMISSION:  01/29/2011 DATE OF DISCHARGE:  01/31/2011                              DISCHARGE SUMMARY   ADMITTING DIAGNOSES: 1. Chest pain rule out myocardial infarction. 2. Hypertension. 3. Non-insulin-dependent diabetes mellitus, controlled by diet. 4. Hypercholesteremia. 5. Chronic kidney disease. 6. Mild prerenal azotemia. 7. Depression/anxiety disorder. 8. Anemia of chronic disease.  FINAL DIAGNOSES: 1. Status post chest pain, myocardial infarction ruled out, negative     Lexiscan Myoview. 2. Hypertension. 3. Non-insulin-dependent diabetes mellitus, controlled by diet. 4. Hypercholesteremia. 5. Chronic kidney disease, improved. 6. Status post mild prerenal azotemia. 7. Anemia of chronic disease. 8. Depression/anxiety disorder.  DISCHARGE HOME MEDICATIONS: 1. Enteric-coated aspirin 81 mg 1 tablet daily. 2. Cozaar 100 mg 1 tablet daily. 3. Metoprolol 25 mg 1 tablet twice daily. 4. Allegra 1 tablet daily as before. 5. Amitriptyline 25 mg 1 tablet every evening. 6. Benadryl 25 mg 1 tablet daily at bedtime. 7. Calcium over the counter 1/2 tablet twice daily as before. 8. Lovaza 1 gram 1 capsule daily. 9. Magnesium oxide 1/2 tablet 4 times daily as before. 10.Multivitamin 1/2 tablet twice daily as before. 11.Nexium 40 mg 1 capsule every morning. 12.Vitamin B complex 1 tablet daily. 13.Xanax 1 mg daily at night. 14.Zetia 10 mg 1 tablet daily. 15.Zoloft 100 mg 1 tablet daily as before.  DIET:  Low salt, low cholesterol, 1800 calories ADA diet.  The patient has been advised to monitor blood pressure and blood sugar daily.  ACTIVITY:  Increase activity slowly as tolerated.  The patient has been advised to stop Hyzaar and Tekturna.  Followup with  me in 1 week.  CONDITION AT DISCHARGE:  Stable.  BRIEF HISTORY AND HOSPITAL COURSE:  Ms. Loseke is a 60 year old white female with past medical history significant for hypertension, non- insulin-dependent diabetes mellitus controlled by diet, hypercholesteremia, chronic kidney disease stage III, depression/anxiety disorder.  She came to the ER via EMS, complaining of retrosternal chest pain, described as sharp and heaviness, radiating to jaw and grade 10/10, associated with diaphoresis.  The patient received 4 baby aspirin and 2 sublingual nitro with relief while at work.  Also, complains of occasional musculoskeletal back pain.  Denies any palpitation, lightheadedness, or syncope.  Denies shortness of breath.  Denies PND, orthopnea, or leg swelling.  Denies exertional chest pain.  Denies any recent cardiac workup.  Denies relation of chest pain to food, breathing, or movement.  PAST MEDICAL HISTORY:  As above.  PAST SURGICAL HISTORY:  She had right breast surgery x2 in the past, had appendectomy in the past, had tonsillectomy in the past, also had exploratory laparotomy for questionable tubal pregnancy in the past.  ALLERGIES:  She is allergic to STATINS.  SOCIAL HISTORY:  She is married, 2 children.  Worked as Public house manager at [employer].  No history of smoking or alcohol abuse.  FAMILY HISTORY:  Positive for coronary artery disease.  Her grandmother had MI in her 52s.  Uncle  had coronary artery disease requiring CABG in his 86s.  One brother is hypertensive.  Mother is alive.  She has atrial fibrillation.  Father died, the cause not known.  MEDICATION AT HOME:  She was on Tekturna, Hyzaar, potassium, Zetia, Nexium, amitriptyline, Xanax, Zoloft, Allegra, Benadryl, vitamin B complex, magnesium and calcium.  PHYSICAL EXAMINATION:  GENERAL:  She was alert and oriented x3 in no acute distress. VITAL SIGNS:  Blood pressure was 154/72, pulse was 84, regular. HEENT:   Conjunctivae was pink. NECK:  Supple.  No JVD, no bruit. LUNGS:  Clear to auscultation without rhonchi or rales. CARDIOVASCULAR:  S1 and S2 was normal.  There was soft systolic murmur. There was no S3 gallop. ABDOMEN:  Soft.  Bowel sounds were present, nontender. EXTREMITIES:  There is no clubbing, cyanosis or edema.  LABORATORY DATA:  Hemoglobin was 11.6, hematocrit 35, white count of 9.8 with no shift to the left.  Her BUN was 43, creatinine 1.80, glucose was 149.  Two sets of cardiac enzymes were negative.  Hemoglobin A1c was 6.4.  Repeat electrolytes, fasting blood sugar 125, BUN 38, creatinine 1.49, magnesium was 1.5.  Cholesterol was 157, LDL 86, HDL 59.  Chest x- ray showed no acute cardiopulmonary disease.  Lexiscan Myoview showed no significant reversibility, normal function with estimated ejection fraction of 71% normal wall motion and contractility.  BRIEF HOSPITAL COURSE:  The patient was admitted to telemetry unit.  MI was ruled out by serial enzymes and EKG.  The patient subsequently underwent Lexiscan Myoview which showed no evidence of reversible ischemia.  The patient had episode of hypotension due to medication. Her Nitropaste was discontinued and Lopressor dose was held last night. The patient remained asymptomatic.  Her blood pressure is stable.  The patient has been ambulating in hallway without any problems.  The patient will be discharged home today on above medications and will be followed up in my office in 1 week.     Eduardo Osier. Sharyn Lull, M.D.     MNH/MEDQ  D:  01/31/2011  T:  01/31/2011  Job:  [id number]  Electronically Signed by Rinaldo Cloud M.D. on 02/15/2011 07:51:30 PM

## 2011-02-28 ENCOUNTER — Ambulatory Visit (INDEPENDENT_AMBULATORY_CARE_PROVIDER_SITE_OTHER): Payer: 59 | Admitting: Gynecology

## 2011-02-28 ENCOUNTER — Encounter: Payer: Self-pay | Admitting: Gynecology

## 2011-02-28 VITALS — BP 140/90

## 2011-02-28 DIAGNOSIS — N8111 Cystocele, midline: Secondary | ICD-10-CM

## 2011-02-28 DIAGNOSIS — N814 Uterovaginal prolapse, unspecified: Secondary | ICD-10-CM

## 2011-02-28 NOTE — Progress Notes (Signed)
Patient presents complaining of feeling a bulge from her vagina with pressure symptoms pulling in her low back and feeling something treatment of her vagina in the shower. She does have a history of a cystocele first degree when she saw [place] in June and was monitoring her symptoms said that she feels it is getting worse. She's not having any overt urinary symptoms. Has occasional dribbling but no overt incontinence or stress type symptoms.  Exam External BUS vagina with second-degree cystocele with straining and first to second degree uterine prolapse no significant rectocele. Cervix grossly normal. Uterus normal size midline mobile adnexa without masses or tenderness rectovaginal exam confirms above  Assessment and plan: cystocele, uterine prolapse symptomatic to the patient. I discussed options with patient to include expected management, pessary, surgery to include attempted [facility] anterior repair. She's not having overt incontinence that I would suggest sling or other surgery at this time. She is being followed for hypertension borderline diabetes and renal failure that according to her they are not sure what the etiology is. Of note she just recently was hospitalized a month ago with chest pain she said that they did not think it was cardiac that she was put through a battery of tests but she is on nitroglycerin when necessary which makes me suspicious as far as a cardiac origin. I reviewed the issues of surgery and the risks given her medical issues encouraged her to consider pessary but she is not interested in trying that at this point. I told her that I want to talk to her cardiologist to get some records to get a better understanding of her underlying medical condition before pursuing any discussions for her surgery is concerned. Patient comfortable with this and she'll follow up with me once I get medical records pertaining to her underlying medical status.

## 2011-03-23 ENCOUNTER — Telehealth: Payer: Self-pay

## 2011-03-23 NOTE — Telephone Encounter (Signed)
I received note from Dr. Velvet Bathe saying "Dana Romero, tell patient we reviewed her medical records that if she is still interested in proceeding with surgery including [facility] BSO anterior repair we can start arranging this and then she would need to have medical clearance from her primary MDs. Let me know and I will thought to scheduling paperwork."     I called patient and left message for her to call me.

## 2011-03-23 NOTE — Telephone Encounter (Signed)
Pt. Called back.  She is to see her Primary Care MD today and wants to talk with him first about surgery and see what he thinks. She will let him know she needs medical clearance from him as well.  She said she will call me after talking with him and let me know what she wants to do. I gave her the direct line for the surgery scheduling office and will wait to hear from her.

## 2011-03-27 ENCOUNTER — Other Ambulatory Visit: Payer: Self-pay | Admitting: *Deleted

## 2011-03-27 NOTE — Telephone Encounter (Signed)
Left message to call in regards to  Rx requests.

## 2011-04-03 NOTE — Telephone Encounter (Signed)
Left message to call in regards to problem.

## 2011-04-06 NOTE — Telephone Encounter (Signed)
Message left to call office

## 2011-04-17 MED ORDER — ESTRADIOL 0.0375 MG/24HR TD PTTW: 1.0000 | MEDICATED_PATCH | TRANSDERMAL | Status: DC

## 2011-04-25 ENCOUNTER — Ambulatory Visit: Payer: Self-pay | Admitting: Family Medicine

## 2011-05-05 ENCOUNTER — Ambulatory Visit: Payer: Self-pay | Admitting: Family Medicine

## 2011-05-11 ENCOUNTER — Ambulatory Visit: Payer: Self-pay | Admitting: Family Medicine

## 2011-05-30 ENCOUNTER — Other Ambulatory Visit: Payer: Self-pay | Admitting: Cardiology

## 2011-06-06 ENCOUNTER — Encounter: Payer: Self-pay | Admitting: Family Medicine

## 2011-06-06 ENCOUNTER — Ambulatory Visit (INDEPENDENT_AMBULATORY_CARE_PROVIDER_SITE_OTHER): Payer: 59 | Admitting: Family Medicine

## 2011-06-06 DIAGNOSIS — F32A Depression, unspecified: Secondary | ICD-10-CM

## 2011-06-06 DIAGNOSIS — F419 Anxiety disorder, unspecified: Secondary | ICD-10-CM

## 2011-06-06 DIAGNOSIS — F411 Generalized anxiety disorder: Secondary | ICD-10-CM

## 2011-06-06 DIAGNOSIS — I10 Essential (primary) hypertension: Secondary | ICD-10-CM

## 2011-06-06 DIAGNOSIS — F329 Major depressive disorder, single episode, unspecified: Secondary | ICD-10-CM

## 2011-06-06 MED ORDER — LOSARTAN POTASSIUM 100 MG PO TABS: 100.0000 mg | ORAL_TABLET | Freq: Every day | ORAL | Status: DC

## 2011-06-06 NOTE — Progress Notes (Signed)
  Subjective:    Patient ID: Dana Romero, female    DOB: 08/17/1954, 61 y.o.   MRN: [id number]  HPI  Ms. Bartus is here today for HTN f/u. She has a history of chronic anxiety and often feels overwhelmed by "life". Has been working full- time and has to care for  Her husband who recently suffered a stroke. Has some chest discomfort with dyspnea and palpitations. Previous hospitalization In October 2012 found this to be related to anxiety.Also had to be seen at [facility] on Christmas Day for evaluation of dyspnea. She was diagnose with Pneumonia. She is compliant with medications. Significant stress related to care of husband and his medication costs. Also, GYN wants pt .to have a Hysterectomy because of Uterine Prolapse.  Ms. Molina has 4 adults children but none of them live in [place]. Two children live in [place].    Review of Systems  Constitutional: Negative.   HENT: Negative.   Respiratory: Positive for chest tightness.   Cardiovascular: Positive for palpitations.  Psychiatric/Behavioral: Positive for sleep disturbance and decreased concentration. Negative for suicidal ideas. The patient is nervous/anxious.        Objective:   Physical Exam  Constitutional: She is oriented to person, place, and time. She appears well-developed and well-nourished.  HENT:  Head: Normocephalic and atraumatic.  Cardiovascular: Normal rate, regular rhythm and normal heart sounds.  Exam reveals no friction rub.   No murmur heard. Pulmonary/Chest: Effort normal and breath sounds normal. No respiratory distress. She has no rales.  Neurological: She is alert and oriented to person, place, and time.  Skin: Skin is warm and dry.  Psychiatric: Judgment and thought content normal.       Tearful at times when discussing issues associated with husband's health.          Assessment & Plan:   1. HYPERTENSION   2. Depression   3. Chronic anxiety    Uncontrolled HTN exacerbated by  Chronic Anxiety and Depression.Pt will continue current medications and return for re-assessment in 4 weeks. Pt will see Dr. Allena Katz in February, get husband's Medicare issues set up and discuss husband's psychiatric issues/possible medication changes with his PCP.

## 2011-06-06 NOTE — Patient Instructions (Signed)
Continue current meds and return  in 1 month.

## 2011-06-07 ENCOUNTER — Encounter: Payer: Self-pay | Admitting: Family Medicine

## 2011-07-04 ENCOUNTER — Encounter: Payer: Self-pay | Admitting: Family Medicine

## 2011-07-04 ENCOUNTER — Ambulatory Visit (INDEPENDENT_AMBULATORY_CARE_PROVIDER_SITE_OTHER): Payer: 59 | Admitting: Family Medicine

## 2011-07-04 VITALS — BP 173/91 | HR 84 | Temp 97.7°F | Resp 16 | Ht 64.0 in | Wt 167.8 lb

## 2011-07-04 DIAGNOSIS — F341 Dysthymic disorder: Secondary | ICD-10-CM

## 2011-07-04 DIAGNOSIS — Z636 Dependent relative needing care at home: Secondary | ICD-10-CM | POA: Insufficient documentation

## 2011-07-04 DIAGNOSIS — Z6379 Other stressful life events affecting family and household: Secondary | ICD-10-CM

## 2011-07-04 DIAGNOSIS — F419 Anxiety disorder, unspecified: Secondary | ICD-10-CM

## 2011-07-04 DIAGNOSIS — Z7189 Other specified counseling: Secondary | ICD-10-CM

## 2011-07-04 DIAGNOSIS — F32A Depression, unspecified: Secondary | ICD-10-CM

## 2011-07-04 HISTORY — DX: Dependent relative needing care at home: Z63.6

## 2011-07-04 MED ORDER — EZETIMIBE 10 MG PO TABS: 10.0000 mg | ORAL_TABLET | Freq: Every day | ORAL | Status: DC

## 2011-07-04 MED ORDER — ESOMEPRAZOLE MAGNESIUM 40 MG PO CPDR: 40.0000 mg | DELAYED_RELEASE_CAPSULE | Freq: Every day | ORAL | Status: DC

## 2011-07-04 MED ORDER — OMEGA-3-ACID ETHYL ESTERS 1 G PO CAPS: 2.0000 g | ORAL_CAPSULE | Freq: Two times a day (BID) | ORAL | Status: DC

## 2011-07-04 NOTE — Patient Instructions (Signed)
Canker Sores  Canker sores are painful, open sores on the inside of the mouth and cheek. They may be white or yellow. The sores usually heal in 1 to 2 weeks. Women are more likely than men to have recurrent canker sores. CAUSES The cause of canker sores is not well understood. More than one cause is likely. Canker sores do not appear to be caused by certain types of germs (viruses or bacteria). Canker sores may be caused by:  An allergic reaction to certain foods.   Digestive problems.   Not having enough vitamin B12, folic acid, and iron.   Female sex hormones. Sores may come only during certain phases of a menstrual cycle. Often, there is improvement during pregnancy.   Genetics. Some people seem to inherit canker sore problems.  Emotional stress and injuries to the mouth may trigger outbreaks, but not cause them.  DIAGNOSIS Canker sores are diagnosed by exam.  TREATMENT  Patients who have frequent bouts of canker sores may have cultures taken of the sores, blood tests, or allergy tests. This helps determine if their sores are caused by a poor diet, an allergy, or some other preventable or treatable disease.   Vitamins may prevent recurrences or reduce the severity of canker sores in people with poor nutrition.   Numbing ointments can relieve pain. These are available in drug stores without a prescription.   Anti-inflammatory steroid mouth rinses or gels may be prescribed by your caregiver for severe sores.   Oral steroids may be prescribed if you have severe, recurrent canker sores. These strong medicines can cause many side effects and should be used only under the close direction of a dentist or physician.   Mouth rinses containing the antibiotic medicine may be prescribed. They may lessen symptoms and speed healing.  Healing usually happens in about 1 or 2 weeks with or without treatment. Certain antibiotic mouth rinses given to pregnant women and young children can permanently  stain teeth. Talk to your caregiver about your treatment. HOME CARE INSTRUCTIONS   Avoid foods that cause canker sores for you.   Avoid citrus juices, spicy or salty foods, and coffee until the sores are healed.   Use a soft-bristled toothbrush.   Chew your food carefully to avoid biting your cheek.   Apply topical numbing medicine to the sore to help relieve pain.   Apply a thin paste of baking soda and water to the sore to help heal the sore.   Only use mouth rinses or medicines for pain or discomfort as directed by your caregiver.  SEEK MEDICAL CARE IF:   Your symptoms are not better in 1 week.   Your sores are still present after 2 weeks.   Your sores are very painful.   You have trouble breathing or swallowing.   Your sores come back frequently.  Document Released: 08/19/2010 Document Revised: 01/04/2011 Document Reviewed: 08/19/2010 [employer] Patient Information 2012 [employer]. 

## 2011-07-04 NOTE — Progress Notes (Signed)
Subjective:    Patient ID: Dana Romero, female    DOB: 08/17/1954, 61 y.o.   MRN: [id number]  HPI   This lovely 61 y.o woman returns for f/u re: Chronic Anxiety and Depression which is stable;   she is under tremendous stress at home as primary caregiver for husband who had CVA several  months ago and has cognitive problems/personality changes as a result of it. His physician recently  started him on Zoloft and she has some information about resources in the community that could be helpful. She is seeing Dr. Allena Katz who has changed BP medication to  get BP better controlled. She c/o episodes of  lightheadedness and low blood sugar especially when she has not eaten for more than 4-5 hours. 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    Review of Systems  As per HPI     Objective:   Physical Exam  Nursing note reviewed. Constitutional: She is oriented to person, place, and time. She appears well-developed and well-nourished.  HENT:  Head: Normocephalic and atraumatic.  Eyes: EOM are normal. No scleral icterus.  Cardiovascular: Normal rate.   Pulmonary/Chest: Effort normal. No respiratory distress.  Neurological: She is alert and oriented to person, place, and time. No cranial nerve deficit.  Psychiatric: She has a normal mood and affect. Her behavior is normal. Judgment and thought content normal.       Pt is visibly anxious but this lessens as the encounter progresses    (Duration of visit: Face-to face time- 45 minutes)      Assessment & Plan:   1. Anxiety and depression         Pt will continue to explore what community resources are                                                              available to assist her in the care of her  husband (he has appt                                                       with his PCP next month)                                          2. Caregiver stress                        As above; no medication changes for the pt at this time.  3. Encounter for medication review and counseling        Medication RFs: Esomeprazole, Ezetimibe, and  Generic Lovaza  (90 days with 3 RFs)

## 2011-07-06 ENCOUNTER — Other Ambulatory Visit: Payer: Self-pay | Admitting: Physician Assistant

## 2011-07-17 ENCOUNTER — Other Ambulatory Visit: Payer: Self-pay | Admitting: Family Medicine

## 2011-08-18 ENCOUNTER — Other Ambulatory Visit: Payer: Self-pay | Admitting: Physician Assistant

## 2011-08-22 ENCOUNTER — Telehealth: Payer: Self-pay

## 2011-08-22 NOTE — Telephone Encounter (Signed)
?    Looks like patient is on Xanax?  Pls advise.

## 2011-08-22 NOTE — Telephone Encounter (Signed)
[facility] is requestiong a rx refill on pt pt clonzapam if questions please contact [phone]

## 2011-08-23 NOTE — Telephone Encounter (Signed)
Chart pulled to [place] 

## 2011-08-23 NOTE — Telephone Encounter (Signed)
Pt has active prescription for Alprazolam 1 mg BID so deny the clonazepam please.

## 2011-08-24 NOTE — Telephone Encounter (Signed)
Spoke with pharmacy and informed them that rx for clonazepam was denied.  Pharmacy stated that they asked for refill on tonazepam 7.5mg  not clonazepam.

## 2011-08-24 NOTE — Telephone Encounter (Signed)
Please pull chart. I do not see either of these medications ordered under epic.  Hriday Stai

## 2011-08-25 NOTE — Telephone Encounter (Signed)
Chart pulled to [place] 

## 2011-08-26 MED ORDER — TEMAZEPAM 30 MG PO CAPS: 30.0000 mg | ORAL_CAPSULE | Freq: Every evening | ORAL | Status: DC | PRN

## 2011-08-26 NOTE — Telephone Encounter (Signed)
Faxed in RX

## 2011-08-26 NOTE — Telephone Encounter (Signed)
Please fax in Rx for temazepam as ordered.

## 2011-09-16 ENCOUNTER — Other Ambulatory Visit: Payer: Self-pay | Admitting: Family Medicine

## 2011-09-26 ENCOUNTER — Other Ambulatory Visit: Payer: Self-pay | Admitting: Physician Assistant

## 2011-09-28 ENCOUNTER — Other Ambulatory Visit: Payer: Self-pay | Admitting: Physician Assistant

## 2011-10-11 ENCOUNTER — Other Ambulatory Visit: Payer: Self-pay | Admitting: Physician Assistant

## 2011-10-12 ENCOUNTER — Other Ambulatory Visit: Payer: Self-pay | Admitting: Women's Health

## 2011-10-16 ENCOUNTER — Other Ambulatory Visit: Payer: Self-pay | Admitting: Physician Assistant

## 2011-10-18 ENCOUNTER — Encounter: Payer: Self-pay | Admitting: Women's Health

## 2011-10-18 ENCOUNTER — Ambulatory Visit (INDEPENDENT_AMBULATORY_CARE_PROVIDER_SITE_OTHER): Payer: 59 | Admitting: Women's Health

## 2011-10-18 VITALS — BP 170/90 | Ht 65.5 in | Wt 167.0 lb

## 2011-10-18 DIAGNOSIS — Z79890 Hormone replacement therapy (postmenopausal): Secondary | ICD-10-CM

## 2011-10-18 DIAGNOSIS — Z01419 Encounter for gynecological examination (general) (routine) without abnormal findings: Secondary | ICD-10-CM

## 2011-10-18 MED ORDER — MEDROXYPROGESTERONE ACETATE 2.5 MG PO TABS: 2.5000 mg | ORAL_TABLET | Freq: Every day | ORAL | Status: DC

## 2011-10-18 MED ORDER — ESTRADIOL 0.06 MG/24HR TD PTWK: 1.0000 | MEDICATED_PATCH | TRANSDERMAL | Status: DC

## 2011-10-18 NOTE — Progress Notes (Signed)
Dana Romero 08/17/1954 [id number]    History:    The patient presents for annual exam. Postmenopausal on HRT Climara 0.06 patch weekly and Provera 2.5 daily. Has occasional spotting 1-2 days one time per year. History of normal mammograms, history of a benign breast cyst in 2012. History of normal Paps. Normal DEXA in 2012 T score -1.0 at left femoral neck. Has not had a colonoscopy, primary care does not want her to have.   Past medical history, past surgical history, family history and social history were all reviewed and documented in the EPIC chart. Diabetes, hypertension, diagnosed  89. Chronic kidney disease. Works as an Public house manager at [employer]. Husband disabled-stroke.   ROS:  A  ROS was performed and pertinent positives and negatives are included in the history.  Exam:  Filed Vitals:   10/18/11 0951  BP: 170/90    General appearance:  Normal Head/Neck:  Normal, without cervical or supraclavicular adenopathy. Thyroid:  Symmetrical, normal in size, without palpable masses or nodularity. Respiratory  Effort:  Normal  Auscultation:  Clear without wheezing or rhonchi Cardiovascular  Auscultation:  Regular rate, without rubs, murmurs or gallops  Edema/varicosities:  Not grossly evident Abdominal  Soft,nontender, without masses, guarding or rebound.  Liver/spleen:  No organomegaly noted  Hernia:  None appreciated  Skin  Inspection:  Grossly normal  Palpation:  Grossly normal Neurologic/psychiatric  Orientation:  Normal with appropriate conversation.  Mood/affect:  Normal  Genitourinary    Breasts: Examined lying and sitting.     Right: Without masses, retractions, discharge or axillary adenopathy.     Left: Without masses, retractions, discharge or axillary adenopathy.   Inguinal/mons:  Normal without inguinal adenopathy  External genitalia:  Normal  BUS/Urethra/Skene's glands:  Normal  Bladder:  Normal  Vagina:  +2 cystocele   Cervix:  Normal  Uterus:  +2 cystocele ,   Midline and mobile  Adnexa/parametria:     Rt: Without masses or tenderness.   Lt: Without masses or tenderness.  Anus and perineum: Normal  Digital rectal exam: Normal sphincter tone without palpated masses or tenderness  Assessment/Plan:  61 y.o. M. WF G4 P2 for annual exam.   Postmenopausal on HRT + 2 cystocele/symptomatic Diabetes/hypertension/chronic kidney disease/environmental allergies primary care labs and meds Normal bone density 2012 T score -1 and femoral neck  Plan: Home Hemoccult card given. No Pap done new screening guidelines reviewed. Has discussed hysterectomy with repair with Dr. Audie Box and is ready to schedule. Will get scheduled in September, has had approval from cardiologist, we'll get renewed. Appointment with Dr. Audie Box prior to surgery. States at times feels that she does not empty her bladder thoroughly, and has occasional incontinence. HRT reviewed, reviewed slight risk for blood clots, strokes, breast cancer. States when she has come off she does not feel well nor does she sleep well. Prescription for Climara 0.06 patch weekly and Provera 2.5 daily reviewed. Instructed to call if any spotting. SBE's, continue annual mammogram, increase exercise, calcium rich diet, vitamin D 1000 daily encouraged. Encouraged to discuss Pneumovax vaccine with primary care.    Harrington Challenger WHNP, 1:30 PM 10/18/2011

## 2011-10-18 NOTE — Patient Instructions (Signed)
Prolapse  Prolapse means the falling down, bulging, dropping, or drooping of a body part. Organs that commonly prolapse include the rectum, small intestine, bladder, urethra, vagina (birth canal), uterus (womb), and cervix. Prolapse occurs when the ligaments and muscle tissue around the rectum, bladder, and uterus are damaged or weakened.  CAUSES  This happens especially with:  Childbirth. Some women feel pelvic pressure or have trouble holding their urine right after childbirth, because of stretching and tearing of pelvic tissues. This generally gets better with time and the feeling usually goes away, but it may return with aging.   Chronic heavy lifting.   Aging.   Menopause, with loss of estrogen production weakening the pelvic ligaments and muscles.   Past pelvic surgery.   Obesity.   Chronic constipation.   Chronic cough.  Prolapse may affect a single organ, or several organs may prolapse at the same time. The front wall of the vagina holds up the bladder. The back wall holds up part of the lower intestine, or rectum. The uterus fills a spot in the middle. All these organs can be involved when the ligaments and muscles around the vagina relax too much. This often gets worse when women stop producing estrogen (menopause). SYMPTOMS  Uncontrolled loss of urine (incontinence) with cough, sneeze, straining, and exercise.   More force may be required to have a bowel movement, due to trapping of the stool.   When part of an organ bulges through the opening of the vagina, there is sometimes a feeling of heaviness or pressure. It may feel as though something is falling out. This sensation increases with coughing or bearing down.   If the organs protrude through the opening of the vagina and rub against the clothing, there may be soreness, ulcers, infection, pain, and bleeding.   Lower back pain.   Pushing in the upper or lower part of the vagina, to pass urine or have a bowel movement.     Problems having sexual intercourse.   Being unable to insert a tampon or applicator.  DIAGNOSIS  Usually, a physical exam is all that is needed to identify the problem. During the examination, you may be asked to cough and strain while lying down, sitting up, and standing up. Your caregiver will determine if more testing is required, such as bladder function tests. Some diagnoses are:  Cystocele: Bulging and falling of the bladder into the top of the vagina.   Rectocele: Part of the rectum bulging into the vagina.   Prolapse of the uterus: The uterus falls or drops into the vagina.   Enterocele: Bulging of the top of the vagina, after a hysterectomy (uterus removal), with the small intestine bulging into the vagina. A hernia in the top of the vagina.   Urethrocele: The urethra (urine carrying tube) bulging into the vagina.  TREATMENT  In most cases, prolapse needs to be treated only if it produces symptoms. If the symptoms are interfering with your usual daily or sexual activities, treatment may be necessary. The following are some measures that may be used to treat prolapse.  Estrogen may help elderly women with mild prolapse.   Kegel exercises may help mild cases of prolapse, by strengthening and tightening the muscles of the pelvic floor.   Pessaries are used in women who choose not to, or are unable to, have surgery. A pessary is a doughnut-shaped piece of plastic or rubber that is put into the vagina to keep the organs in place. This device must   be fitted by your caregiver. Your caregiver will also explain how to care for yourself with the pessary. If it works well for you, this may be the only treatment required.   Surgery is often the only form of treatment for more severe prolapses. There are different types of surgery available. You should discuss what the best procedure is for you. If the uterus is prolapsed, it may be removed (hysterectomy) as part of the surgical treatment.  Your caregiver will discuss the risks and benefits with you.   Uterine-vaginal suspension (surgery to hold up the organs) may be used, especially if you want to maintain your fertility.  No form of treatment is guaranteed to correct the prolapse or relieve the symptoms. HOME CARE INSTRUCTIONS   Wear a sanitary pad or absorbent product if you have incontinence of urine.   Avoid heavy lifting and straining with exercise and work.   Take over-the-counter pain medicine for minor discomfort.   Try taking estrogen or using estrogen vaginal cream.   Try Kegel exercises or use a pessary, before deciding to have surgery.   Do Kegel exercises after having a baby.  SEEK MEDICAL CARE IF:   Your symptoms interfere with your daily activities.   You need medicine to help with the discomfort.   You need to be fitted with a pessary.   You notice bleeding from the vagina.   You think you have ulcers or you notice ulcers on the cervix.   You have an oral temperature above 102 F (38.9 C).   You develop pain or blood with urination.   You have bleeding with a bowel movement.   The symptoms are interfering with your sex life.   You have urinary incontinence that interferes with your daily activities.   You lose urine with sexual intercourse.   You have a chronic cough.   You have chronic constipation.  Document Released: 10/29/2002 Document Revised: 04/13/2011 Document Reviewed: 05/09/2009 [facility] Patient Information 2012 [place], [place].Health Recommendations for Postmenopausal Women Based on the Results of the Women's Health Initiative [facility]) and Other Studies The WHI is a major 15-year research program to address the most common causes of death, disability and poor quality of life in postmenopausal women. Some of these causes are heart disease, cancer, bone loss (osteoporosis) and others. Taking into account all of the findings from [facility] and other studies, here are bottom-line health  recommendations for women: CARDIOVASCULAR DISEASE Heart Disease: A heart attack is a medical emergency. Know the signs and symptoms of a heart attack. Hormone therapy should not be used to prevent heart disease. In women with heart disease, hormone therapy should not be used to prevent further disease. Hormone therapy increases the risk of blood clots. Below are things women can do to reduce their risk for heart disease.   Do not smoke. If you smoke, quit. Women who smoke are 2 to 6 times more likely to suffer a heart attack than non-smoking women.   Aim for a healthy weight. Being overweight causes many preventable deaths. Eat a healthy and balanced diet and drink an adequate amount of liquids.   Get moving. Make a commitment to be more physically active. Aim for 30 minutes of activity on most, if not all days of the week.   Eat for heart health. Choose a diet that is low in saturated fat, trans fat, and cholesterol. Include whole grains, vegetables, and fruits. Read the labels on the food container before buying it.   Know  your numbers. Ask your caregiver to check your blood pressure, cholesterol (total, HDL, LDL, triglycerides) and blood glucose. Work with your caregiver to improve any numbers that are not normal.   High blood pressure. Limit or stop your table salt intake (try salt substitute and food seasonings), avoid salty foods and drinks. Read the labels on the food container before buying it. Avoid becoming overweight by eating well and exercising.  STROKE  Stroke is a medical emergency. Stroke can be the result of a blood clot in the blood vessel in the brain or by a brain hemorrhage (bleeding). Know the signs and symptoms of a stroke. To lower the risk of developing a stroke:  Avoid fatty foods.   Quit smoking.   Control your diabetes, blood pressure, and irregular heart rate.  THROMBOPHLIBITIS (BLOOD CLOT) OF THE LEG  Hormone treatment is a big cause of developing blood clots in  the leg. Becoming overweight and leading a stationary lifestyle also may contribute to developing blood clots. Controlling your diet and exercising will help lower the risk of developing blood clots. CANCER SCREENING  Breast Cancer: Women should take steps to reduce their risk of breast cancer. This includes having regular mammograms, monthly self breast exams and regular breast exams by your caregiver. Have a mammogram every one to two years if you are 50 to 62 years old. Have a mammogram annually if you are 12 years old or older depending on your risk factors. Women who are high risk for breast cancer may need more frequent mammograms. There are tests available (testing the genes in your body) if you have family history of breast cancer called BRCA 1 and 2. These tests can help determine the risks of developing breast cancer.   Intestinal or Stomach Cancer: Women should talk to their caregiver about when to start screening, what tests and how often they should be done, and the benefits and risks of doing these tests. Tests to consider are a rectal exam, fecal occult blood, sigmoidoscopy, colononoscoby, barium enema and upper GI series of the stomach. Depending on the age, you may want to get a medical and family history of colon cancer. Women who are high risk may need to be screened at an earlier age and more often.   Cervical Cancer: A Pap test of the cervix should be done every year and every 3 years when there has been three straight years of a normal Pap test. Women with an abnormal Pap test should be screened more often or have a cervical biopsy depending on your caregiver's recommendation.   Uterine Cancer: If you have vaginal bleeding after you are in the menopause, it should be evaluated by your caregiver.   Ovarian cancer: There are no reliable tests available to screen for ovarian cancer at this time except for yearly pelvic exams.   Lung Cancer: Yearly chest X-rays can detect lung cancer  and should be done on high risk women, such as cigarette smokers and women with chronic lung disease (emphysemia).   Skin Cancer: A complete body skin exam should be done at your yearly examination. Avoid overexposure to the sun and ultraviolet light lamps. Use a strong sun block cream when in the sun. All of these things are important in lowering the risk of skin cancer.  MENOPAUSE Menopause Symptoms: Hormone therapy products are effective for treating symptoms associated with menopause:  Moderate to severe hot flashes.   Night sweats.   Mood swings.   Headaches.   Tiredness.  Loss of sex drive.   Insomnia.   Other symptoms.  However, hormone therapy products carry serious risks, especially in older women. Women who use or are thinking about using estrogen or estrogen with progestin treatments should discuss that with their caregiver. Your caregiver will know if the benefits outweigh the risks. The Food and Drug Administration (FDA) has concluded that hormone therapy should be used only at the lowest doses and for the shortest amount of time to reach treatment goals. It is not known at what doses there may be less risk of serious side effects. There are other treatments such as herbal medication (not controlled or regulated by the FDA), group therapy, counseling and acupuncture that may be helpful. OSTEOPOROSIS Protecting Against Bone Loss and Preventing Fracture: If hormone therapy is used for prevention of bone loss (osteoporosis), the risks for bone loss must outweigh the risk of the therapy. Women considering taking hormone therapy for bone loss should ask their health care providers about other medications (fosamax and boniva) that are considered safe and effective for preventing bone loss and bone fractures. To guard against bone loss or fractures, it is recommended that women should take at least 1000-1500 mg of calcium and 400-800 IU of vitamin D daily in divided doses. Smoking and  excessive alcohol intake increases the risk of osteoporosis. Eat foods rich in calcium and vitamin D and do weight bearing exercises several times a week as your caregiver suggests. DIABETES Diabetes Melitus: Women with Type I or Type 2 diabetes should keep their diabetes in control with diet, exercise and medication. Avoid too many sweets, starchy and fatty foods. Being overweight can affect your diabetes. COGNITION AND MEMORY Cognition and Memory: Menopausal hormone therapy is not recommended for the prevention of cognitive disorders such as Alzheimer's disease or memory loss. WHI found that women treated with hormone therapy have a greater risk of developing dementia.  DEPRESSION  Depression may occur at any age, but is common in elderly women. The reasons may be because of physical, medical, social (loneliness), financial and/or economic problems and needs. Becoming involved with church, volunteer or social groups, seeking treatment for any physical or medical problems is recommended. Also, look into getting professional advice for any economic or financial problems. ACCIDENTS  Accidents are common and can be serious in the elderly woman. Prepare your house to prevent accidents. Eliminate throw rugs, use hip protectors, place hand bars in the bath, shower and toilet areas. Avoid wearing high heel shoes and walking on wet, snowy and icy areas. Stop driving if you have vision, hearing problems or are unsteady with you movements and reflexes. RHEUMATOID ARTHRITIS Rheumatoid arthritis causes pain, swelling and stiffness of your bone joints. It can limit many of your activities. Over-the-counter medications may help, but prescription medications may be necessary. Talk with your caregiver about this. Exercise (walking, water aerobics), good posture, using splints on painful joints, warm baths or applying warm compresses to stiff joints and cold compresses to painful joints may be helpful. Smoking and  excessive drinking may worsen the symptoms of arthritis. Seek help from a physical therapist if the arthritis is becoming a problem with your daily activities. IMMUNIZATIONS  Several immunizations are important to have during your senior years, including:   Tetanus and a diptheria shot booster every 10 years.   Influenza every year before the flu season begins.   Pneumonia vaccine.   Shingles vaccine.   Others as indicated (example: H1N1 vaccine).  Document Released: 06/16/2005 Document Revised: 04/13/2011  Document Reviewed: 02/10/2008 [facility] Patient Information 2012 [place], [place].

## 2011-10-19 ENCOUNTER — Telehealth: Payer: Self-pay | Admitting: Gynecology

## 2011-10-19 NOTE — Telephone Encounter (Signed)
Patient informed that I received order for surgery for her but also see that she requests mid Sept. I do not have that schedule yet but should have it mid July and she can expect call from me then.  I told her I will go ahead and check on her insurance benefits and send her a financial letter to give her an idea regarding benefits and if any pre-payment amount due to [facility].

## 2011-10-22 ENCOUNTER — Other Ambulatory Visit: Payer: Self-pay | Admitting: Family Medicine

## 2011-10-23 ENCOUNTER — Encounter: Payer: Self-pay | Admitting: Gynecology

## 2011-10-29 ENCOUNTER — Other Ambulatory Visit: Payer: Self-pay | Admitting: Women's Health

## 2011-10-30 ENCOUNTER — Telehealth: Payer: Self-pay | Admitting: Gynecology

## 2011-10-30 NOTE — Telephone Encounter (Signed)
Patient left me a message that she has decided she cannot wait until Sept for surgery as she originally planned. There are some changes occurring where she is employed and she has concerns regarding insurance changing.    I called her back and left message for her to call me and let me know when she would like to schedule. I can probably schedule her in the next few weeks.

## 2011-10-30 NOTE — Telephone Encounter (Signed)
Patient called me back.  She is ready to schedule surgery as soon as I can and knows that will be around July 15 or later. I reminded her that Dr. Audie Box wants her to get medical clearance from her primary care MD.  She should go ahead and call and see what they will need her to do to obtain clearance.  I spoke with her about insurance benefits and I am going to check with administration regarding possible financial arrangements for her.  I will call her back tomorrow.

## 2011-11-01 ENCOUNTER — Encounter: Payer: Self-pay | Admitting: Family Medicine

## 2011-11-01 ENCOUNTER — Ambulatory Visit (INDEPENDENT_AMBULATORY_CARE_PROVIDER_SITE_OTHER): Payer: 59 | Admitting: Family Medicine

## 2011-11-01 VITALS — BP 157/71 | HR 71 | Temp 97.8°F | Resp 16 | Ht 64.5 in | Wt 165.6 lb

## 2011-11-01 DIAGNOSIS — Z13220 Encounter for screening for lipoid disorders: Secondary | ICD-10-CM

## 2011-11-01 DIAGNOSIS — D649 Anemia, unspecified: Secondary | ICD-10-CM

## 2011-11-01 DIAGNOSIS — R002 Palpitations: Secondary | ICD-10-CM

## 2011-11-01 DIAGNOSIS — Z0000 Encounter for general adult medical examination without abnormal findings: Secondary | ICD-10-CM

## 2011-11-01 DIAGNOSIS — J019 Acute sinusitis, unspecified: Secondary | ICD-10-CM

## 2011-11-01 LAB — CBC WITH DIFFERENTIAL/PLATELET
Basophils Relative: 0 % (ref 0–1)
Eosinophils Absolute: 0.3 10*3/uL (ref 0.0–0.7)
Eosinophils Relative: 4 % (ref 0–5)
HCT: 35.9 % — ABNORMAL LOW (ref 36.0–46.0)
Hemoglobin: 11.8 g/dL — ABNORMAL LOW (ref 12.0–15.0)
MCH: 28.1 pg (ref 26.0–34.0)
MCHC: 32.9 g/dL (ref 30.0–36.0)
MCV: 85.5 fL (ref 78.0–100.0)
Monocytes Absolute: 0.6 10*3/uL (ref 0.1–1.0)
Monocytes Relative: 8 % (ref 3–12)
Neutro Abs: 4.9 10*3/uL (ref 1.7–7.7)

## 2011-11-01 LAB — POCT URINALYSIS DIPSTICK
Bilirubin, UA: NEGATIVE
Blood, UA: NEGATIVE
Ketones, UA: NEGATIVE
Protein, UA: NEGATIVE
pH, UA: 5.5

## 2011-11-01 LAB — COMPREHENSIVE METABOLIC PANEL
BUN: 26 mg/dL — ABNORMAL HIGH (ref 6–23)
CO2: 28 mEq/L (ref 19–32)
Calcium: 9 mg/dL (ref 8.4–10.5)
Chloride: 102 mEq/L (ref 96–112)
Creat: 1.29 mg/dL — ABNORMAL HIGH (ref 0.50–1.10)

## 2011-11-01 LAB — LIPID PANEL
Cholesterol: 205 mg/dL — ABNORMAL HIGH (ref 0–200)
HDL: 57 mg/dL (ref >39)
Total CHOL/HDL Ratio: 3.6 Ratio
Triglycerides: 85 mg/dL (ref <150)

## 2011-11-01 MED ORDER — CEFUROXIME AXETIL 500 MG PO TABS: 500.0000 mg | ORAL_TABLET | Freq: Two times a day (BID) | ORAL | Status: AC

## 2011-11-01 NOTE — Patient Instructions (Addendum)
Sinusitis Sinuses are air pockets within the bones of your face. The growth of bacteria within a sinus leads to infection. The infection prevents the sinuses from draining. This infection is called sinusitis. SYMPTOMS  There will be different areas of pain depending on which sinuses have become infected.  The maxillary sinuses often produce pain beneath the eyes.   Frontal sinusitis may cause pain in the middle of the forehead and above the eyes.  Other problems (symptoms) include:  Toothaches.   Colored, pus-like (purulent) drainage from the nose.   Swelling, warmth, and tenderness over the sinus areas may be signs of infection.  TREATMENT  Sinusitis is most often determined by an exam.X-rays may be taken. If x-rays have been taken, make sure you obtain your results or find out how you are to obtain them. Your caregiver may give you medications (antibiotics). These are medications that will help kill the bacteria causing the infection. You may also be given a medication (decongestant) that helps to reduce sinus swelling.  HOME CARE INSTRUCTIONS   Only take over-the-counter or prescription medicines for pain, discomfort, or fever as directed by your caregiver.   Drink extra fluids. Fluids help thin the mucus so your sinuses can drain more easily.   Applying either moist heat or ice packs to the sinus areas may help relieve discomfort.   Use saline nasal sprays to help moisten your sinuses. The sprays can be found at your local drugstore.  SEEK IMMEDIATE MEDICAL CARE IF:  You have a fever.   You have increasing pain, severe headaches, or toothache.   You have nausea, vomiting, or drowsiness.   You develop unusual swelling around the face or trouble seeing.  MAKE SURE YOU:   Understand these instructions.   Will watch your condition.   Will get help right away if you are not doing well or get worse.  Document Released: 04/24/2005 Document Revised: 04/13/2011 Document Reviewed:  11/21/2006 [facility] Patient Information 2012 [place], [place]    .Keeping You Healthy  Get These Tests  Blood Pressure- Have your blood pressure checked by your healthcare provider at least once a year.  Normal blood pressure is 120/80.  Weight- Have your body mass index (BMI) calculated to screen for obesity.  BMI is a measure of body fat based on height and weight.  You can calculate your own BMI at https://www.west-esparza.[email or web address]  Cholesterol- Have your cholesterol checked every year.  Diabetes- Have your blood sugar checked every year if you have high blood pressure, high cholesterol, a family history of diabetes or if you are overweight.  Pap Smear- Have a pap smear every 1 to 3 years if you have been sexually active.  If you are older than 65 and recent pap smears have been normal you may not need additional pap smears.  In addition, if you have had a hysterectomy  For benign disease additional pap smears are not necessary.  Mammogram-Yearly mammograms are essential for early detection of breast cancer  Screening for Colon Cancer- Colonoscopy starting at age 75. Screening may begin sooner depending on your family history and other health conditions.  Follow up colonoscopy as directed by your Gastroenterologist.  Screening for Osteoporosis- Screening begins at age 62 with bone density scanning, sooner if you are at higher risk for developing Osteoporosis.  Get these medicines  Calcium with Vitamin D- Your body requires 1200-1500 mg of Calcium a day and [id number] IU of Vitamin D a day.  You can only absorb 500 mg of  Calcium at a time therefore Calcium must be taken in 2 or 3 separate doses throughout the day.  Hormones- Hormone therapy has been associated with increased risk for certain cancers and heart disease.  Talk to your healthcare provider about if you need relief from menopausal symptoms.  Aspirin- Ask your healthcare provider about taking Aspirin to prevent Heart Disease and  Stroke.  Get these Immuniztions  Flu shot- Every fall  Pneumonia shot- Once after the age of 80; if you are younger ask your healthcare provider if you need a pneumonia shot.  Tetanus- Every ten years.  Zostavax- Once after the age of 40 to prevent shingles.  Take these steps  Don't smoke- Your healthcare provider can help you quit. For tips on how to quit, ask your healthcare provider or go to www.smokefree.gov or call 1-800 QUIT-NOW.  Be physically active- Exercise 5 days a week for a minimum of 30 minutes.  If you are not already physically active, start slow and gradually work up to 30 minutes of moderate physical activity.  Try walking, dancing, bike riding, swimming, etc.  Eat a healthy diet- Eat a variety of healthy foods such as fruits, vegetables, whole grains, low fat milk, low fat cheeses, yogurt, lean meats, chicken, fish, eggs, dried beans, tofu, etc.  For more information go to www.thenutritionsource.org  Dental visit- Brush and floss teeth twice daily; visit your dentist twice a year.  Eye exam- Visit your Optometrist or Ophthalmologist yearly.  Drink alcohol in moderation- Limit alcohol intake to one drink or less a day.  Never drink and drive.  Depression- Your emotional health is as important as your physical health.  If you're feeling down or losing interest in things you normally enjoy, please talk to your healthcare provider.  Seat Belts- can save your life; always wear one  Smoke/Carbon Monoxide detectors- These detectors need to be installed on the appropriate level of your home.  Replace batteries at least once a year.  Violence- If anyone is threatening or hurting you, please tell your healthcare provider.  Living Will/ Health care power of attorney- Discuss with your healthcare provider and family.

## 2011-11-01 NOTE — Progress Notes (Signed)
Subjective:    Patient ID: Dana Romero, female    DOB: 08/17/1954, 61 y.o.   MRN: [id number]  HPI  This 61 y.o. Cauc female is here for CPE but also pre-operative clearance for planned TAH with  repair of cystocele by GYN (date unknown). Pt is also under care of Dr. Allena Katz (Nephrologist)- last visit  April 2013 with next visit in August.  She has sweating and chills and thinks she has sinus infection with  green nasal discharge. Evaluation in past at [facility]; pt was advised to start allergy  injections but that is on hold. She has chronic Anxiety and Depression , currently on medications.    Last Romero/ GYN exam:  June 2013 (normal)   Last MMG: April 2012 (abnormal)   DEXA: per pt- done/ normal   Colonoscopy: per pt- "Dr. Does not want me to have one because of kidney function")    Review of Systems  Constitutional: Positive for chills, diaphoresis and fatigue.  HENT: Positive for ear pain, congestion, facial swelling, rhinorrhea, mouth sores, dental problem, voice change, sinus pressure and tinnitus.   Eyes: Positive for photophobia, redness and itching. Negative for visual disturbance.  Respiratory: Positive for cough and wheezing. Negative for chest tightness and shortness of breath.   Cardiovascular: Positive for palpitations and leg swelling. Negative for chest pain.  Gastrointestinal: Positive for nausea and constipation. Negative for vomiting, abdominal pain and blood in stool.  Genitourinary:       Reviewed at GYN  Musculoskeletal: Positive for myalgias, joint swelling and arthralgias. Negative for back pain and gait problem.  Skin: Negative.   Neurological: Positive for dizziness, syncope, light-headedness and headaches.  Hematological: Bruises/bleeds easily.  Psychiatric/Behavioral: Positive for dysphoric mood, decreased concentration and agitation. The patient is nervous/anxious.        Objective:   Physical Exam  Nursing note and vitals  reviewed. Constitutional: She is oriented to person, place, and time. She appears well-developed and well-nourished. No distress.  HENT:  Head: Normocephalic and atraumatic.  Right Ear: External ear normal.  Left Ear: External ear normal.  Nose: Nose normal.  Mouth/Throat: Oropharynx is clear and moist.  Eyes: Conjunctivae and EOM are normal. Pupils are equal, round, and reactive to light. No scleral icterus.  Neck: Normal range of motion. Neck supple. No JVD present.  Cardiovascular: Normal rate, regular rhythm, normal heart sounds and intact distal pulses.  Exam reveals no gallop and no friction rub.   No murmur heard. Pulmonary/Chest: Effort normal and breath sounds normal. No respiratory distress. She has no wheezes.  Abdominal: Soft. Bowel sounds are normal. She exhibits no mass. There is no tenderness. There is no guarding.  Musculoskeletal: Normal range of motion. She exhibits no edema.  Lymphadenopathy:    She has no cervical adenopathy.  Neurological: She is alert and oriented to person, place, and time. She has normal reflexes. No cranial nerve deficit. She exhibits normal muscle tone. Coordination normal.  Skin: Skin is warm. No rash noted. No erythema.  Psychiatric: Her behavior is normal. Thought content normal.       Flat affect    ECG: NSR; no ST-TW changes, no ectopy  Results for orders placed in visit on 11/01/11  POCT URINALYSIS DIPSTICK      Component Value Range   Color, UA yellow     Clarity, UA clear     Glucose, UA neg     Bilirubin, UA neg     Ketones, UA neg  Spec Grav, UA 1.020     Blood, UA neg     pH, UA 5.5     Protein, UA neg     Urobilinogen, UA 0.2     Nitrite, UA neg     Leukocytes, UA Trace          Assessment & Plan:   1. Routine general medical examination at a health care facility  EKG 12-Lead, Comprehensive metabolic panel  2. Palpitations  TSH, T4, Free  3. Sinusitis, acute  RX: Cefuroxime 500 mg 1 tablet bid x 10 days  4.  Anemia - chronic and due to kidney disease CBC with Differential Follow-up with Dr. Allena Katz for further adjustment of BP meds  5. Screening for hyperlipidemia  Lipid panel   Pt was hospitalized in Sept 2012 by Dr. Sharyn Lull for Chest Pain rule out MI; enzymes and ECG were negative. Lexiscan Myoview showed no evidence of reversible ischemia.

## 2011-11-07 NOTE — Progress Notes (Signed)
Quick Note:  Please call pt and advise that the following labs are abnormal... Your blood sugar is elevated and has been for several years. Nutrition modifications will help improve this number so that you can remain off medication in an attempt to control your Diabetes. Reducing portion sizes and increasing physical activity can help. Also Cinnamon has been shown to help with glucose metabolism. Kidney function is stable even slightly better than in September.  You are to be congratulated for losing 40 pounds; another 10 pounds could make a tremendous difference in your overall health and the labs numbers. Lipids are still above goal on current medications (Zetia and Lovaza). Again, look at changes that you can make in your nutrition. Thyroid tests are normal. Your blood counts are stable, about the same as 9 months ago.  Copy to pt. ______

## 2011-11-11 ENCOUNTER — Other Ambulatory Visit: Payer: Self-pay | Admitting: Women's Health

## 2011-11-13 ENCOUNTER — Telehealth: Payer: Self-pay | Admitting: Gynecology

## 2011-11-13 NOTE — Telephone Encounter (Deleted)
Patient said she only uses the Vivelle Dot .[phone] occasionally. She supplements her Climara 0.06 patch when symptoms get really bad. Usually only a couple of times a month.

## 2011-11-13 NOTE — Telephone Encounter (Signed)
Patient said she uses the Vivelle Dot 0.375 mg patch a few times a month to supplement her Climara 0.06 mg patch when symptoms get bad.

## 2011-11-13 NOTE — Telephone Encounter (Addendum)
Patient had asked [place] to work with her finances as she cannot afford her surgery pre-payment.  Patient had said that her insurance company would reimburse her up to the amount of her deductible after she turns in the bills.  Patient had been asked earlier to get this in writing for [place].  However, she tells me that HR said that they have never done that before because they cannot guarantee [place] payment as once the check is issued to her she is responsible for paying her bill.  I told patient that we did understand that all that is being requested is a confirmation of this benefit in writing.  She will go back and talk with them and get it in writing. Said she will call me back.

## 2011-11-13 NOTE — Telephone Encounter (Signed)
Patient's chart shows her on Climara 0.6 mg patch once weekly. Refill above was requested. OK?

## 2011-11-13 NOTE — Telephone Encounter (Signed)
Please call and check to see if she is on Climara 0.06 patch weekly. Okay for refill on that she had her annual exam in June. If she prefers the Vivelle Dot 0.0375 twice weekly, that's fine if prefers over Climara. Marland Kitchen

## 2011-11-13 NOTE — Telephone Encounter (Signed)
Okay to refill, please call patient and say best to stay with only one product.

## 2011-11-20 ENCOUNTER — Other Ambulatory Visit: Payer: Self-pay | Admitting: Family Medicine

## 2011-11-23 ENCOUNTER — Other Ambulatory Visit: Payer: Self-pay | Admitting: Physician Assistant

## 2011-11-24 ENCOUNTER — Other Ambulatory Visit: Payer: Self-pay

## 2011-11-24 ENCOUNTER — Other Ambulatory Visit: Payer: Self-pay | Admitting: *Deleted

## 2011-11-24 MED ORDER — LOSARTAN POTASSIUM 100 MG PO TABS: 100.0000 mg | ORAL_TABLET | Freq: Every day | ORAL | Status: DC

## 2011-12-03 ENCOUNTER — Other Ambulatory Visit: Payer: Self-pay | Admitting: Physician Assistant

## 2011-12-04 ENCOUNTER — Other Ambulatory Visit: Payer: Self-pay | Admitting: *Deleted

## 2011-12-13 ENCOUNTER — Other Ambulatory Visit: Payer: Self-pay

## 2011-12-13 MED ORDER — SERTRALINE HCL 100 MG PO TABS: 100.0000 mg | ORAL_TABLET | Freq: Every day | ORAL | Status: DC

## 2011-12-19 ENCOUNTER — Other Ambulatory Visit: Payer: Self-pay | Admitting: Physician Assistant

## 2011-12-19 NOTE — Telephone Encounter (Signed)
Ok x 4 months   

## 2011-12-28 ENCOUNTER — Other Ambulatory Visit: Payer: Self-pay | Admitting: Physician Assistant

## 2011-12-30 ENCOUNTER — Other Ambulatory Visit: Payer: Self-pay

## 2011-12-30 MED ORDER — METHOCARBAMOL 500 MG PO TABS: 500.0000 mg | ORAL_TABLET | ORAL | Status: DC

## 2012-01-12 ENCOUNTER — Encounter: Payer: Self-pay | Admitting: Family Medicine

## 2012-01-12 ENCOUNTER — Ambulatory Visit (INDEPENDENT_AMBULATORY_CARE_PROVIDER_SITE_OTHER): Payer: 59 | Admitting: Physician Assistant

## 2012-01-12 VITALS — BP 138/82 | HR 69 | Temp 97.7°F | Resp 16 | Ht 64.0 in | Wt 167.0 lb

## 2012-01-12 DIAGNOSIS — Z111 Encounter for screening for respiratory tuberculosis: Secondary | ICD-10-CM

## 2012-01-12 NOTE — Progress Notes (Signed)
Patient ID: Dana Romero MRN: [id number], DOB: 08/17/1954, 61 y.o. Date of Encounter: 01/12/2012, 5:24 PM  Primary Physician: Dow Adolph, MD  Chief Complaint: PPD placement  HPI: 61 y.o. year old female here for PPD. No prior positive PPD. Asymptomatic. Needs for work. Works for [employer]. Going to transitioning into home care. Asymptomatic.    Has had a PPD before. Not had one in the past 6 weeks. Never had a positive reaction. Never had BCG vaccine. Never had tuberculosis. Never been in contact with someone with tuberculosis. Has not been advised by a doctor to not have a TB skin test.  Past Medical History  Diagnosis Date  . Hypertension 1998  . Chronic kidney disease     PER PT  . Depression   . Sinusitis, chronic   . Anxiety   . Diabetes mellitus 1998    off meds since 2009 or 2010     Home Meds: Prior to Admission medications   Medication Sig Start Date End Date Taking? Authorizing Provider  ALPRAZolam Prudy Feeler) 1 MG tablet TAKE ONE TABLET BY MOUTH IN THE MORNING AND ONE-HALF TABLET IN THE EVENING AS NEEDED FOR ANXIETY 12/28/11  Yes Heather M Marte, PA-C  amitriptyline (ELAVIL) 25 MG tablet Take 25 mg by mouth at bedtime.     Yes Historical Provider, MD  carvedilol (COREG) 25 MG tablet Take 25 mg by mouth 2 (two) times daily with a meal.   Yes Historical Provider, MD  colchicine 0.6 MG tablet Take 0.6 mg by mouth daily.   Yes Historical Provider, MD  diphenhydrAMINE (BENADRYL) 25 mg capsule Take 25 mg by mouth at bedtime.   Yes Historical Provider, MD  EPINEPHrine (EPI-PEN) 0.3 mg/0.3 mL DEVI Inject 0.3 mg into the muscle once.     Yes Historical Provider, MD  esomeprazole (NEXIUM) 40 MG capsule Take 1 capsule (40 mg total) by mouth daily before breakfast. 07/04/11  Yes Maurice March, MD  estradiol (CLIMARA) 0.06 MG/24HR Place 1 patch onto the skin once a week. 10/18/11  Yes Harrington Challenger, NP  fexofenadine-pseudoephedrine (ALLEGRA-D) 60-120 MG per tablet  Take 1 tablet by mouth daily.   Yes Historical Provider, MD  furosemide (LASIX) 20 MG tablet Take 20 mg by mouth 2 (two) times daily.   Yes Historical Provider, MD  hydrALAZINE (APRESOLINE) 50 MG tablet Take 50 mg by mouth 3 (three) times daily.   Yes Historical Provider, MD  losartan (COZAAR) 100 MG tablet TAKE ONE TABLET BY MOUTH EVERY DAY 12/19/11  Yes Morrell Riddle, PA-C  Losartan Potassium-HCTZ (HYZAAR PO) Take by mouth daily.     Yes Historical Provider, MD  medroxyPROGESTERone (PROVERA) 2.5 MG tablet Take 1 tablet (2.5 mg total) by mouth daily. 10/18/11  Yes Harrington Challenger, NP  omega-3 acid ethyl esters (LOVAZA) 1 G capsule Take 2 capsules (2 g total) by mouth 2 (two) times daily. 07/04/11  Yes Maurice March, MD  OVER THE COUNTER MEDICATION Garlic 3000mg  q am, 2000mg  q pm, 2000mg  qhs    Yes Historical Provider, MD  promethazine (PHENERGAN) 25 MG tablet Take 25 mg by mouth every 6 (six) hours as needed.   Yes Historical Provider, MD  sertraline (ZOLOFT) 100 MG tablet Take 1 tablet (100 mg total) by mouth daily. 12/13/11  Yes Chelle S Jeffery, PA-C  temazepam (RESTORIL) 30 MG capsule TAKE ONE CAPSULE BY MOUTH AT BEDTIME AS NEEDED. 12/03/11  Yes Morrell Riddle, PA-C  VIVELLE-DOT 0.0375 MG/24HR APPLY ONE [facility]  TOPICALLY TWICE A WEEK 11/11/11  Yes Harrington Challenger, NP  ZETONNA 37 MCG/ACT AERS USE ONCE DAILY PER INSTRUCTIONS. 12/19/11  Yes Tawnya Pujol M Keedan Sample, PA-C  ezetimibe (ZETIA) 10 MG tablet Take 1 tablet (10 mg total) by mouth daily. 07/04/11   Maurice March, MD  fluticasone [facility]) 50 MCG/ACT nasal spray Place 2 sprays into the nose daily.    Historical Provider, MD  methocarbamol (ROBAXIN) 500 MG tablet Take 1 tablet (500 mg total) by mouth 1 day or 1 dose. 12/30/11   Chelle S Jeffery, PA-C  methocarbamol (ROBAXIN) 750 MG tablet Take 750 mg by mouth daily.    Historical Provider, MD    Allergies:  Allergies  Allergen Reactions  . Amlodipine Besylate     REACTION: intolerance  . Atorvastatin       REACTION: intolerance  . Ezetimibe-Simvastatin     REACTION: intolerance  . Losartan Potassium-Hctz   . Niacin     REACTION: intolerance  . Rosuvastatin     REACTION: intolerance  . Toprol Xl (Metoprolol Succinate) Other (See Comments)    Makes her feel weak  . Latex Rash    History   Social History  . Marital Status: Divorced    Spouse Name: N/A    Number of Children: N/A  . Years of Education: N/A   Occupational History  . nurse tech    Social History Main Topics  . Smoking status: Never Smoker   . Smokeless tobacco: Never Used  . Alcohol Use: No  . Drug Use: No  . Sexually Active: Not Currently -- Female partner(s)   Other Topics Concern  . Not on file   Social History Narrative  . No narrative on file     Review of Systems: Constitutional: negative for chills, fever, night sweats, weight changes, or fatigue  HEENT: negative for vision changes, hearing loss, or epistaxis Cardiovascular: negative for chest pain or palpitations Respiratory: negative for hemoptysis, wheezing, shortness of breath, or cough Abdominal: negative for abdominal pain, nausea, vomiting, diarrhea, or constipation Dermatological: negative for rash Neurologic: negative for headache, dizziness, or syncope All other systems reviewed and are otherwise negative with the exception to those above and in the HPI.   Physical Exam: Blood pressure 138/82, pulse 69, temperature 97.7 F (36.5 C), temperature source Oral, resp. rate 16, height 5\' 4"  (1.626 m), weight 167 lb (75.751 kg), SpO2 98.00%., Body mass index is 28.67 kg/(m^2). General: Well developed, well nourished, in no acute distress. Head: Normocephalic, atraumatic, eyes without discharge, sclera non-icteric, nares are without discharge.   Neck: Supple. No thyromegaly. Full ROM. No lymphadenopathy. Lungs: Clear bilaterally to auscultation without wheezes, rales, or rhonchi. Breathing is unlabored. Heart: RRR with S1 S2. No murmurs,  rubs, or gallops appreciated. Msk:  Strength and tone normal for age. Extremities/Skin: Warm and dry. No clubbing or cyanosis. No edema. No rashes or suspicious lesions. Neuro: Alert and oriented X 3. Moves all extremities spontaneously. Gait is normal. CNII-XII grossly in tact. Psych:  Responds to questions appropriately with a normal affect.     ASSESSMENT AND PLAN:  61 y.o. year old female here for PPD. -PPD placed -RTC 48-72 hours for reading  Signed, Eula Listen, PA-C 01/12/2012 5:24 PM

## 2012-01-12 NOTE — Progress Notes (Signed)
PPD Placement note  Dana Romero, 61 y.o. female is here today for placement of PPD test  Reason for PPD test: employment   1. Has the patient ever had a TB Skin Test?: yes  2. Has the patient had a TB Skin Test in the past 6 weeks? no  If so, Date of Test September never had test done  3. Has the patient ever had a positive reaction? no  If yes, were you treated with INH? no   4. Has the patient ever had a BCG vaccine? no  5. Has the patient ever had Tuberculosis? no  6. Has the patient been in recent contact with anyone known or suspected of having     active TB disease?: no      Date of exposure (if applicable):n/a      Name of person they were exposed to (if applicable): n/a  Patient's Country of origin?: n/a  7. Has the patient ever been advised by a healthcare provider not to have a TB skin test? no   Verified in allergy area and with patient that the patient is not allergic to the products PPD is made of (Phenol or Tween). No:   Is patient taking any oral or IV steroid medication now or have they taken it in the last month? no  PPD placed on 01/12/2012 at 5:30 pm.  Patient advised to return for reading within 48-72 hours.

## 2012-01-15 ENCOUNTER — Encounter (INDEPENDENT_AMBULATORY_CARE_PROVIDER_SITE_OTHER): Payer: 59 | Admitting: Internal Medicine

## 2012-01-15 ENCOUNTER — Ambulatory Visit: Payer: 59 | Admitting: Gynecology

## 2012-01-15 DIAGNOSIS — Z111 Encounter for screening for respiratory tuberculosis: Secondary | ICD-10-CM

## 2012-01-15 LAB — TB SKIN TEST: TB Skin Test: NEGATIVE

## 2012-01-17 ENCOUNTER — Telehealth: Payer: Self-pay | Admitting: Gynecology

## 2012-01-17 ENCOUNTER — Ambulatory Visit (INDEPENDENT_AMBULATORY_CARE_PROVIDER_SITE_OTHER): Payer: 59 | Admitting: Family Medicine

## 2012-01-17 ENCOUNTER — Encounter: Payer: Self-pay | Admitting: Family Medicine

## 2012-01-17 VITALS — BP 140/90 | HR 83 | Temp 98.1°F | Resp 16 | Ht 64.5 in | Wt 167.2 lb

## 2012-01-17 DIAGNOSIS — F411 Generalized anxiety disorder: Secondary | ICD-10-CM

## 2012-01-17 DIAGNOSIS — F419 Anxiety disorder, unspecified: Secondary | ICD-10-CM

## 2012-01-17 DIAGNOSIS — E119 Type 2 diabetes mellitus without complications: Secondary | ICD-10-CM

## 2012-01-17 DIAGNOSIS — Z23 Encounter for immunization: Secondary | ICD-10-CM

## 2012-01-17 DIAGNOSIS — I10 Essential (primary) hypertension: Secondary | ICD-10-CM

## 2012-01-17 DIAGNOSIS — R410 Disorientation, unspecified: Secondary | ICD-10-CM

## 2012-01-17 LAB — POCT GLYCOSYLATED HEMOGLOBIN (HGB A1C): Hemoglobin A1C: 6.1

## 2012-01-17 NOTE — Progress Notes (Signed)
  Subjective:    Patient ID: Dana Romero, female    DOB: 08/17/1954, 61 y.o.   MRN: [id number]  HPI  This 61 y.o. Cauc female has HTN, DM-diet controlled, chronic renal insufficiency (sees Dr. Allena Katz)  and chronic anxiety and stress. She has concern today regarding mental and cognitive difficulties-  notes problems with concentration and organization in her home and work life. She notes declining   performance at work. She feels "overwhelmed". Taking meds for anxiety/ depression and insomnia.   She also has a headache over her left eye that has interrupted her sleep and has not fully resolved with   OTC analgesic. She attributes this to sinus problems; she is taking her meds as prescribed.  After seeing Dr. Allena Katz last month, she had to change her nutrition to correct some electrolyte   abnormalities (high K+ and low Magnesium). BP at home: 130-140/80s.   Re: DM (diet-controlled)- FSBS 110-140; has had symptomatic hypoglycemia with sugars down to 50s.   Pt also having menopausal symptoms which are being treated by GYN; she is using 2 different kinds of   patches as well as taking Provera tablet.   Review of Systems As per HPI>     Objective:   Physical Exam  Nursing note and vitals reviewed. Constitutional: She is oriented to person, place, and time. She appears well-developed and well-nourished. No distress.  HENT:  Head: Normocephalic and atraumatic.  Right Ear: Hearing, external ear and ear canal normal. Tympanic membrane is scarred. Tympanic membrane is not erythematous. No middle ear effusion.  Left Ear: Hearing, external ear and ear canal normal. Tympanic membrane is scarred. Tympanic membrane is not erythematous.  No middle ear effusion.  Nose: Nose normal. No nasal deformity or septal deviation. Right sinus exhibits no maxillary sinus tenderness and no frontal sinus tenderness. Left sinus exhibits no maxillary sinus tenderness and no frontal sinus tenderness.  Mouth/Throat:  Uvula is midline and mucous membranes are normal. No oral lesions. Posterior oropharyngeal erythema present. No oropharyngeal exudate.  Eyes: Conjunctivae normal and EOM are normal. Right eye exhibits no discharge. Left eye exhibits no discharge. No scleral icterus.  Neck: Neck supple. No thyromegaly present.  Cardiovascular: Normal rate and regular rhythm.   Pulmonary/Chest: Effort normal and breath sounds normal. No respiratory distress.  Lymphadenopathy:    She has no cervical adenopathy.  Neurological: She is alert and oriented to person, place, and time. No cranial nerve deficit.  Skin: Skin is warm and dry. No rash noted. No erythema.  Psychiatric: Her behavior is normal. Judgment and thought content normal.       Pt is mildly anxious.    Results for orders placed in visit on 01/17/12  POCT GLYCOSYLATED HEMOGLOBIN (HGB A1C)      Component Value Range   Hemoglobin A1C 6.1           Assessment & Plan:   1. Anxiety  Continue current medications Focus on stress reduction techniques and strategies.  2. Type II or unspecified type diabetes mellitus without mention of complication, not stated as uncontrolled  POCT glycosylated hemoglobin (Hb A1C)  3. Mental confusion -stress induced but consider medication side effects   4. Essential hypertension, benign  Flu vaccine greater than or equal to 3yo preservative free IM

## 2012-01-17 NOTE — Patient Instructions (Addendum)
Stress Management Stress is a state of physical or mental tension that often results from changes in your life or normal routine. Some common causes of stress are:  Death of a loved one.   Injuries or severe illnesses.   Getting fired or changing jobs.   Moving into a new home.  Other causes may be:  Sexual problems.   Business or financial losses.   Taking on a large debt.   Regular conflict with someone at home or at work.   Constant tiredness from lack of sleep.  It is not just bad things that are stressful. It may be stressful to:  Win the lottery.   Get married.   Buy a new car.  The amount of stress that can be easily tolerated varies from person to person. Changes generally cause stress, regardless of the types of change. Too much stress can affect your health. It may lead to physical or emotional problems. Too little stress (boredom) may also become stressful. SUGGESTIONS TO REDUCE STRESS:  Talk things over with your family and friends. It often is helpful to share your concerns and worries. If you feel your problem is serious, you may want to get help from a professional counselor.   Consider your problems one at a time instead of lumping them all together. Trying to take care of everything at once may seem impossible. List all the things you need to do and then start with the most important one. Set a goal to accomplish 2 or 3 things each day. If you expect to do too many in a single day you will naturally fail, causing you to feel even more stressed.   Do not use alcohol or drugs to relieve stress. Although you may feel better for a short time, they do not remove the problems that caused the stress. They can also be habit forming.   Exercise regularly - at least 3 times per week. Physical exercise can help to relieve that "uptight" feeling and will relax you.   The shortest distance between despair and hope is often a good night's sleep.   Go to bed and get up on  time allowing yourself time for appointments without being rushed.   Take a short "time-out" period from any stressful situation that occurs during the day. Close your eyes and take some deep breaths. Starting with the muscles in your face, tense them, hold it for a few seconds, then relax. Repeat this with the muscles in your neck, shoulders, hand, stomach, back and legs.   Take good care of yourself. Eat a balanced diet and get plenty of rest.   Schedule time for having fun. Take a break from your daily routine to relax.  HOME CARE INSTRUCTIONS   Call if you feel overwhelmed by your problems and feel you can no longer manage them on your own.   Return immediately if you feel like hurting yourself or someone else.  Document Released: 10/18/2000 Document Revised: 04/13/2011 Document Reviewed: 06/10/2007 [facility] Patient Information 2012 [place], [place].    Shingles Vaccine What You Need to Know WHAT IS SHINGLES?  Shingles is a painful skin rash, often with blisters. It is also called Herpes Zoster or just Zoster.   A shingles rash usually appears on one side of the face or body and lasts from 2 to 4 weeks. Its main symptom is pain, which can be quite severe. Other symptoms of shingles can include fever, headache, chills, and upset stomach. Very rarely, a  shingles infection can lead to pneumonia, hearing problems, blindness, brain inflammation (encephalitis), or death.   For about 1 person in 5, severe pain can continue even after the rash clears up. This is called post-herpetic neuralgia.   Shingles is caused by the Varicella Zoster virus. This is the same virus that causes chickenpox. Only someone who has had a case of chickenpox or rarely, has gotten chickenpox vaccine, can get shingles. The virus stays in your body. It can reappear many years later to cause a case of shingles.   You cannot catch shingles from another person with shingles. However, a person who has never had chickenpox  (or chickenpox vaccine) could get chickenpox from someone with shingles. This is not very common.   Shingles is far more common in people 75 and older than in younger people. It is also more common in people whose immune systems are weakened because of a disease such as cancer or drugs such as steroids or chemotherapy.   At least 1 million people get shingles per year in the [place].  SHINGLES VACCINE  A vaccine for shingles was licensed in 2006. In clinical trials, the vaccine reduced the risk of shingles by 50%. It can also reduce the pain in people who still get shingles after being vaccinated.   A single dose of shingles vaccine is recommended for adults 42 years of age and older.  SOME PEOPLE SHOULD NOT GET SHINGLES VACCINE OR SHOULD WAIT A person should not get shingles vaccine if he or she:  Has ever had a life-threatening allergic reaction to gelatin, the antibiotic neomycin, or any other component of shingles vaccine. Tell your caregiver if you have any severe allergies.   Has a weakened immune system because of current:   AIDS or another disease that affects the immune system.   Treatment with drugs that affect the immune system, such as prolonged use of high-dose steroids.   Cancer treatment, such as radiation or chemotherapy.   Cancer affecting the bone marrow or lymphatic system, such as leukemia or lymphoma.   Is pregnant, or might be pregnant. Women should not become pregnant until at least 4 weeks after getting shingles vaccine.  Someone with a minor illness, such as a cold, may be vaccinated. Anyone with a moderate or severe acute illness should usually wait until he or she recovers before getting the vaccine. This includes anyone with a temperature of 101.3 F (38 C) or higher. WHAT ARE THE RISKS FROM SHINGLES VACCINE?  A vaccine, like any medicine, could possibly cause serious problems, such as severe allergic reactions. However, the risk of a vaccine causing  serious harm, or death, is extremely small.   No serious problems have been identified with shingles vaccine.  Mild Problems  Redness, soreness, swelling, or itching at the site of the injection (about 1 person in 3).   Headache (about 1 person in 70).  Like all vaccines, shingles vaccine is being closely monitored for unusual or severe problems. WHAT IF THERE IS A MODERATE OR SEVERE REACTION? What should I look for? Any unusual condition, such as a severe allergic reaction or a high fever. If a severe allergic reaction occurred, it would be within a few minutes to an hour after the shot. Signs of a serious allergic reaction can include difficulty breathing, weakness, hoarseness or wheezing, a fast heartbeat, hives, dizziness, paleness, or swelling of the throat. What should I do?  Call your caregiver, or get the person to a caregiver right  away.   Tell the caregiver what happened, the date and time it happened, and when the vaccination was given.   Ask the caregiver to report the reaction by filing a Vaccine Adverse Event Reporting System (VAERS) form. Or, you can file this report through the VAERS web site at www.vaers.LAgents.no or by calling 1-(517)302-8991.  VAERS does not provide medical advice. HOW CAN I LEARN MORE?  Ask your caregiver. He or she can give you the vaccine package insert or suggest other sources of information.   Contact the Centers for Disease Control and Prevention (CDC):   Call (903)424-0899 (1-800-CDC-INFO).   Visit the CDC website at PicCapture.uy  CDC Shingles Vaccine VIS (02/11/08) Document Released: 02/19/2006 Document Revised: 04/13/2011 Document Reviewed: 02/11/2008 [facility] Patient Information 2012 [place], [place].

## 2012-01-18 NOTE — Telephone Encounter (Signed)
No encounter; error

## 2012-01-30 ENCOUNTER — Telehealth: Payer: Self-pay | Admitting: Gynecology

## 2012-01-30 ENCOUNTER — Ambulatory Visit: Payer: 59 | Admitting: Gynecology

## 2012-01-30 NOTE — Telephone Encounter (Signed)
I contacted patient today regarding no show for her scheduled appt at 12 00pm and to see if patient had any further financial questions regarding an elective surgery pending.ph

## 2012-02-01 ENCOUNTER — Other Ambulatory Visit: Payer: Self-pay

## 2012-02-01 ENCOUNTER — Other Ambulatory Visit: Payer: Self-pay | Admitting: Physician Assistant

## 2012-02-01 MED ORDER — ALPRAZOLAM 1 MG PO TABS: ORAL_TABLET | ORAL | Status: DC

## 2012-03-04 ENCOUNTER — Other Ambulatory Visit: Payer: Self-pay | Admitting: Physician Assistant

## 2012-03-04 ENCOUNTER — Other Ambulatory Visit: Payer: Self-pay | Admitting: *Deleted

## 2012-04-01 ENCOUNTER — Other Ambulatory Visit: Payer: Self-pay | Admitting: Physician Assistant

## 2012-04-07 ENCOUNTER — Other Ambulatory Visit: Payer: Self-pay | Admitting: Physician Assistant

## 2012-04-09 NOTE — Telephone Encounter (Signed)
I authorized refill via phone for Alprazolam 1 mg tab  #45 with 1 RF (sig is unchanged) to pt's pharmacy.

## 2012-05-02 ENCOUNTER — Other Ambulatory Visit: Payer: Self-pay | Admitting: Physician Assistant

## 2012-05-08 DIAGNOSIS — Z0271 Encounter for disability determination: Secondary | ICD-10-CM

## 2012-05-09 ENCOUNTER — Ambulatory Visit (INDEPENDENT_AMBULATORY_CARE_PROVIDER_SITE_OTHER): Payer: 59 | Admitting: Family Medicine

## 2012-05-09 ENCOUNTER — Encounter: Payer: Self-pay | Admitting: Family Medicine

## 2012-05-09 VITALS — BP 173/89 | HR 86 | Temp 97.8°F | Resp 18 | Ht 65.0 in | Wt 162.0 lb

## 2012-05-09 DIAGNOSIS — J3489 Other specified disorders of nose and nasal sinuses: Secondary | ICD-10-CM

## 2012-05-09 DIAGNOSIS — R42 Dizziness and giddiness: Secondary | ICD-10-CM

## 2012-05-09 DIAGNOSIS — F419 Anxiety disorder, unspecified: Secondary | ICD-10-CM

## 2012-05-09 DIAGNOSIS — F411 Generalized anxiety disorder: Secondary | ICD-10-CM

## 2012-05-09 DIAGNOSIS — E119 Type 2 diabetes mellitus without complications: Secondary | ICD-10-CM

## 2012-05-09 DIAGNOSIS — E7800 Pure hypercholesterolemia, unspecified: Secondary | ICD-10-CM

## 2012-05-09 DIAGNOSIS — J309 Allergic rhinitis, unspecified: Secondary | ICD-10-CM | POA: Insufficient documentation

## 2012-05-09 DIAGNOSIS — I10 Essential (primary) hypertension: Secondary | ICD-10-CM

## 2012-05-09 LAB — BASIC METABOLIC PANEL
CO2: 31 mEq/L (ref 19–32)
Calcium: 9.2 mg/dL (ref 8.4–10.5)
Chloride: 103 mEq/L (ref 96–112)
Glucose, Bld: 117 mg/dL — ABNORMAL HIGH (ref 70–99)
Potassium: 5.3 mEq/L (ref 3.5–5.3)
Sodium: 144 mEq/L (ref 135–145)

## 2012-05-09 LAB — POCT GLYCOSYLATED HEMOGLOBIN (HGB A1C): Hemoglobin A1C: 6.4

## 2012-05-09 MED ORDER — HYDRALAZINE HCL 50 MG PO TABS: 50.0000 mg | ORAL_TABLET | Freq: Two times a day (BID) | ORAL | Status: DC

## 2012-05-09 MED ORDER — FUROSEMIDE 20 MG PO TABS: 20.0000 mg | ORAL_TABLET | Freq: Two times a day (BID) | ORAL | Status: DC

## 2012-05-09 MED ORDER — TEMAZEPAM 30 MG PO CAPS: 30.0000 mg | ORAL_CAPSULE | Freq: Every evening | ORAL | Status: DC | PRN

## 2012-05-09 MED ORDER — CEFUROXIME AXETIL 500 MG PO TABS: 500.0000 mg | ORAL_TABLET | Freq: Two times a day (BID) | ORAL | Status: DC

## 2012-05-09 MED ORDER — PROMETHAZINE HCL 25 MG PO TABS: 25.0000 mg | ORAL_TABLET | Freq: Four times a day (QID) | ORAL | Status: DC | PRN

## 2012-05-09 MED ORDER — LOSARTAN POTASSIUM 100 MG PO TABS: 100.0000 mg | ORAL_TABLET | Freq: Every day | ORAL | Status: DC

## 2012-05-09 MED ORDER — SERTRALINE HCL 100 MG PO TABS: 100.0000 mg | ORAL_TABLET | Freq: Every day | ORAL | Status: DC

## 2012-05-09 MED ORDER — CARVEDILOL 25 MG PO TABS: 25.0000 mg | ORAL_TABLET | Freq: Two times a day (BID) | ORAL | Status: DC

## 2012-05-09 NOTE — Patient Instructions (Addendum)
I have refilled all BP medications; Cefuroxime 500 mg 1 tablet twice a day has been prescribed for sinusitis. If this problem persists,  CT of your sinuses is the next step. Try to make an appt with your dentist in the next few weeks, if possible. Diet control of Diabetes is still working; see if you can improve your diet and reduce stress.

## 2012-05-09 NOTE — Progress Notes (Signed)
S:  This 62 y.o. Cauc female is here today for follow-up and medication refills.  She "feels bad" and is having recurrent low-grade fevers. Pt reports resp infection 2 days before Thanksgiving. Seen in [place], [place] at medical facility day before Thanksgiving- treated w/ steroid injection. Now w/ HA, dizziness, green nasal d/c and R otalgia. Thinks that this problem maybe related to chronic dental issues involving teeth in maxilla. Amoxicillin is ineffective in clearing infections.  She has diet-controlled DM but has been over-indulging in good foods during the Holidays. She has been stressed with loss of job (under questionable circumstances which have been resolved) and birth of 1st grandchild. She went to [facility] at the time of his birth (he was 3 weeks premature) and stayed at the home while parents returned to work. 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  ROS: negative for chills, facial swelling visual disturbances, n/v/d, epistaxis, CP or tightness, SOB or DOE, palpitations, orthopnea, polyuria or polydipsia, numbness, weakness or syncope.   O:  Filed Vitals:   05/09/12 1320  BP: 173/89  Pulse: 86  Temp: 97.8 F (36.6 C)  Resp: 18   GEN: In NAD; WN,WD.  HEENT: [place]/AT. EOMI w/ clear conj/sclerae. EACs normal. TMs w/ good light reflex, no effusions. Post ph mildly erythematous w/o lesions. Frontal and maxillary sinuses tender w/ percussion. NECK: Supple w/o LAN or TMG. Tender R posterior neck muscles. COR: RRR. LUNGS: Normal resp rate and effort. SKIN: W7D; pale but this is pt's normal color. NEURO: A&O x 3; Cns intact. Nonfocal.  Results for orders placed in visit on 05/09/12  POCT GLYCOSYLATED HEMOGLOBIN (HGB A1C)      Component Value Range   Hemoglobin A1C 6.4      A/P: 1. Dizziness - suspect related to sinusitis   2. Sinus pain - sinusitis  + dental issues RX: Cefuroxime 500 mg 1 tab bid x 14 days. Recommend sch w/ Dentist soon. Consider CT (sinuses) if symptoms persist  3. HTN (hypertension) - continue current medications and reduce stress Basic metabolic panel, Magnesium, Vitamin D, 25-hydroxy  4. Type II or unspecified type diabetes mellitus without mention of complication, not stated as uncontrolled - stable POCT glycosylated hemoglobin (Hb A1C) Resume healthy nutrition and minimize stress  5. Chronic anxiety    6. Hypercholesteremia  LDL Cholesterol, Direct

## 2012-05-10 ENCOUNTER — Encounter: Payer: Self-pay | Admitting: Radiology

## 2012-05-10 NOTE — Progress Notes (Signed)
Quick Note:  Please contact pt and advise that the following labs are abnormal...  Kidney function show shows a slight decline from Creatinine= 1.29 in June 2013 to Creatinine= 1.39 on 1/2/ 2014. You can share these results with Dr. Allena Katz at your next visit with him. LDL ("bad") cholesterol is still above normal but this is not a fasting value. Vitamin D level is good.  Magnesium is a little low so continue Magnesium tablets.  Copy to pt. ______

## 2012-05-30 ENCOUNTER — Other Ambulatory Visit: Payer: Self-pay | Admitting: Family Medicine

## 2012-06-10 ENCOUNTER — Other Ambulatory Visit: Payer: Self-pay | Admitting: Physician Assistant

## 2012-06-10 NOTE — Telephone Encounter (Signed)
Please pull paper chart.  

## 2012-06-11 NOTE — Telephone Encounter (Signed)
Chart pulled to PA pool at nurses station [phone]

## 2012-07-02 ENCOUNTER — Other Ambulatory Visit: Payer: Self-pay | Admitting: Physician Assistant

## 2012-07-03 ENCOUNTER — Other Ambulatory Visit: Payer: Self-pay | Admitting: Radiology

## 2012-07-08 ENCOUNTER — Other Ambulatory Visit: Payer: Self-pay | Admitting: Physician Assistant

## 2012-07-08 ENCOUNTER — Other Ambulatory Visit: Payer: Self-pay | Admitting: Family Medicine

## 2012-07-20 ENCOUNTER — Telehealth: Payer: Self-pay | Admitting: *Deleted

## 2012-07-20 NOTE — Telephone Encounter (Signed)
[facility] ON [address] REQUESTING REFILLS ON MAG OXIDE 400MG  1 BID LAST FILLED 03/26/12 AND KLOR-CON M10 1 QD LAST FILLED 02/01/12.

## 2012-07-21 MED ORDER — MAGNESIUM OXIDE 400 MG PO TABS: 400.0000 mg | ORAL_TABLET | Freq: Two times a day (BID) | ORAL | Status: DC

## 2012-07-21 MED ORDER — POTASSIUM CHLORIDE ER 10 MEQ PO TBCR: EXTENDED_RELEASE_TABLET | ORAL | Status: DC

## 2012-07-21 NOTE — Telephone Encounter (Signed)
Requested meds e-prescribed to [facility]. Message to pharmacy- "pt needs office visit".

## 2012-07-22 ENCOUNTER — Other Ambulatory Visit: Payer: Self-pay | Admitting: Physician Assistant

## 2012-08-08 ENCOUNTER — Ambulatory Visit: Payer: 59 | Admitting: Family Medicine

## 2012-08-10 ENCOUNTER — Other Ambulatory Visit: Payer: Self-pay | Admitting: Family Medicine

## 2012-08-10 ENCOUNTER — Other Ambulatory Visit: Payer: Self-pay | Admitting: Physician Assistant

## 2012-08-10 NOTE — Telephone Encounter (Signed)
Needs OV per last RFs from Dr. Audria Nine

## 2012-08-11 ENCOUNTER — Other Ambulatory Visit: Payer: Self-pay | Admitting: Family Medicine

## 2012-08-29 ENCOUNTER — Other Ambulatory Visit: Payer: Self-pay | Admitting: Family Medicine

## 2012-09-03 ENCOUNTER — Ambulatory Visit: Payer: Self-pay | Admitting: Family Medicine

## 2012-09-03 ENCOUNTER — Encounter: Payer: Self-pay | Admitting: Family Medicine

## 2012-09-03 VITALS — BP 132/80 | HR 92 | Temp 97.8°F | Resp 16 | Ht 65.0 in | Wt 172.0 lb

## 2012-09-03 DIAGNOSIS — F418 Other specified anxiety disorders: Secondary | ICD-10-CM

## 2012-09-03 DIAGNOSIS — Z760 Encounter for issue of repeat prescription: Secondary | ICD-10-CM

## 2012-09-03 DIAGNOSIS — I10 Essential (primary) hypertension: Secondary | ICD-10-CM

## 2012-09-03 DIAGNOSIS — F341 Dysthymic disorder: Secondary | ICD-10-CM

## 2012-09-03 MED ORDER — METHOCARBAMOL 500 MG PO TABS: 500.0000 mg | ORAL_TABLET | Freq: Every evening | ORAL | Status: DC | PRN

## 2012-09-03 MED ORDER — SERTRALINE HCL 100 MG PO TABS: ORAL_TABLET | ORAL | Status: DC

## 2012-09-03 MED ORDER — TRIAMCINOLONE ACETONIDE(NASAL) 55 MCG/ACT NA INHA: 2.0000 | Freq: Every day | NASAL | Status: DC

## 2012-09-03 MED ORDER — PROMETHAZINE HCL 25 MG PO TABS: 25.0000 mg | ORAL_TABLET | Freq: Four times a day (QID) | ORAL | Status: DC | PRN

## 2012-09-03 MED ORDER — ALPRAZOLAM 1 MG PO TABS: ORAL_TABLET | ORAL | Status: DC

## 2012-09-03 NOTE — Progress Notes (Signed)
S:  This 62 y.o. Cauc female has chronic depression w/ anxiety and insomnia. She has been severely stressed since December 2013 due to job-related issues. She is currently unemployed and facing financial difficulties,having to withdraw funds  from her retirement account; unemployment benefits were limited to  19 weeks. She is now seeking new employment but struggling w/ lack of motivation as well as fatigue. She is pursuing some legal recourse but feels like brain is "cluttered" and concentration and focus are lacking. She has changed how she is taking Alprazolam; she takes 1 tablet at midday and 1 tab in evening to try to improve sleep hygiene. She takes Temazepam as needed 2-3 times a week if at all. She does take Amitriptyline 25 mg hs but still sleeps only a few hours. Pt does not voices suicidal ideation.  Re: HTN- since leaving her prior job, her BP is much improved. She admits reducing Carvedilol and Hydralazine to 1 tablet each daily. Losartan dose is maintained at 1 tablet daily; pt continues to see Dr. Allena Katz routinely.  Pt denies diaphoresis, vision changes, CP or tightness, palpitations, SOB or DOE, edema, change of skin or hair, muscle cramps, HA, dizziness, weakness or syncope.  ROS: As per HPI; otherwise noncontributory.  O:  Filed Vitals:   09/03/12 1321  BP: 132/80  Pulse: 92  Temp: 97.8 F (36.6 C)  Resp: 16   GEN: In NAD; WN,WD. HENT; [place]/AT; EOMI w/ clear conj and sclerae. EACs/ nose and oroph clear. COR: RRR. LUNGS: Normal resp rate and effort. SKIN: W&D; mild pallor. NEURO: A&O x 3; CNs intact. Mentation- calm demeanor w/ flat affect; good eye contact, coherent speech and thought pattern. Judgement is normal.  A/P: Depression with anxiety- Increase Sertraline to 150 mg daily; continue Alprazolam 1 mg twice a day, Amitriptyline 25 mg hs and Temazepam prn.  HTN (hypertension)- continue to monitor and follow-up w/ Dr. Allena Katz as scheduled.  Issue of repeat  prescriptions  Meds ordered this encounter  Medications  . triamcinolone (NASACORT) 55 MCG/ACT nasal inhaler    Sig: Place 2 sprays into the nose daily.  . methocarbamol (ROBAXIN) 500 MG tablet    Sig: Take 1 tablet (500 mg total) by mouth at bedtime as needed.    Dispense:  30 tablet    Refill:  1  . sertraline (ZOLOFT) 100 MG tablet    Sig: Take 1 1/2 tablets every day as directed.    Dispense:  45 tablet    Refill:  3  . promethazine (PHENERGAN) 25 MG tablet    Sig: Take 1 tablet (25 mg total) by mouth every 6 (six) hours as needed for nausea.    Dispense:  30 tablet    Refill:  1  . ALPRAZolam (XANAX) 1 MG tablet    Sig: Take 1 tablet at midday and 1 tablet in the evening.    Dispense:  60 tablet    Refill:  1     RTC in 6 weeks; contact office sooner with any questions or concerns.

## 2012-09-03 NOTE — Patient Instructions (Addendum)
I have increased Sertraline 100 mg to 1 1/2 tablets every day for a total daily dose = 150 mg. Continue other medications as we discussed and I will see you again in 6 weeks.

## 2012-09-04 ENCOUNTER — Telehealth: Payer: Self-pay

## 2012-09-04 NOTE — Telephone Encounter (Signed)
PT STATES SHE HAD SEEN DR [facility] AND FORGOT TO ASK FOR A WRITTEN PRESCRIPTION FOR HER NEXIUM WITH A 90 DAY SUPPLY AND 3 REFILLS. THAT IS THE WAY HER INSURANCE WILL COVER IT. PLEASE CALL [phone] WHEN READY AND SHE WILL PICK UP

## 2012-09-04 NOTE — Telephone Encounter (Signed)
It was sent in for #90 with refills in Feb, tried to call her, no answer will try again.

## 2012-10-17 ENCOUNTER — Ambulatory Visit: Payer: Self-pay | Admitting: Family Medicine

## 2012-10-17 ENCOUNTER — Other Ambulatory Visit: Payer: Self-pay

## 2012-10-17 MED ORDER — POTASSIUM CHLORIDE ER 10 MEQ PO TBCR: EXTENDED_RELEASE_TABLET | ORAL | Status: DC

## 2012-10-22 ENCOUNTER — Ambulatory Visit: Payer: Self-pay | Admitting: Family Medicine

## 2012-11-04 ENCOUNTER — Other Ambulatory Visit: Payer: Self-pay

## 2012-11-04 DIAGNOSIS — Z79890 Hormone replacement therapy (postmenopausal): Secondary | ICD-10-CM

## 2012-11-04 MED ORDER — ESTRADIOL 0.06 MG/24HR TD PTWK: 1.0000 | MEDICATED_PATCH | TRANSDERMAL | Status: DC

## 2012-11-08 ENCOUNTER — Other Ambulatory Visit: Payer: Self-pay | Admitting: Family Medicine

## 2012-11-12 ENCOUNTER — Telehealth: Payer: Self-pay

## 2012-11-12 ENCOUNTER — Other Ambulatory Visit: Payer: Self-pay | Admitting: Family Medicine

## 2012-11-12 MED ORDER — ALPRAZOLAM 1 MG PO TABS: ORAL_TABLET | ORAL | Status: DC

## 2012-11-12 MED ORDER — METHOCARBAMOL 500 MG PO TABS: 500.0000 mg | ORAL_TABLET | Freq: Every evening | ORAL | Status: DC | PRN

## 2012-11-12 NOTE — Telephone Encounter (Signed)
Pt would like a refill on alprazolam and mathocarbamol. She states that she has an appt on July 17th, 2014. [facility]acy: [facility] [address] [phone]

## 2012-11-12 NOTE — Telephone Encounter (Signed)
[facility] on [address] called to let us know that they cannot send Rx's electronically. They called requesting to get a verbal on the Rx's the patient requested. For: alprazolam and mathocarbamol  Please advise. \  [facility]: [phone]

## 2012-11-12 NOTE — Telephone Encounter (Signed)
Alprazolam and Methocarbamol phoned to pt's pharmacy.

## 2012-11-13 NOTE — Telephone Encounter (Signed)
Alprazolam phoned to pt's pharmacy. 

## 2012-11-14 ENCOUNTER — Encounter: Payer: Self-pay | Admitting: Women's Health

## 2012-11-19 ENCOUNTER — Ambulatory Visit: Payer: Self-pay | Admitting: Family Medicine

## 2012-11-21 ENCOUNTER — Ambulatory Visit: Payer: Self-pay | Admitting: Family Medicine

## 2012-12-02 ENCOUNTER — Telehealth: Payer: Self-pay

## 2012-12-02 NOTE — Telephone Encounter (Signed)
Pt states she needs estradol patch called to  [facility].   Best phone for pt is [phone]

## 2012-12-02 NOTE — Telephone Encounter (Signed)
Does not look like we have been providing this, do you want to prescribe?

## 2012-12-04 ENCOUNTER — Other Ambulatory Visit: Payer: Self-pay | Admitting: Family Medicine

## 2012-12-04 DIAGNOSIS — Z79890 Hormone replacement therapy (postmenopausal): Secondary | ICD-10-CM

## 2012-12-04 MED ORDER — ESTRADIOL 0.06 MG/24HR TD PTWK: 1.0000 | MEDICATED_PATCH | TRANSDERMAL | Status: DC

## 2012-12-04 NOTE — Telephone Encounter (Signed)
Patient has appt with you on Friday, using last patch now. Please advise.

## 2012-12-04 NOTE — Telephone Encounter (Signed)
Pt has been getting this from Maryelizabeth Rowan, NP who did authorize refills for pt in June 2014. Pt was advised by that provider that she needed to sch annual exam.

## 2012-12-04 NOTE — Telephone Encounter (Signed)
I refilled patches; pt will still need to schedule GYN appt for exam.

## 2012-12-04 NOTE — Telephone Encounter (Signed)
Patient advised.

## 2012-12-06 ENCOUNTER — Ambulatory Visit: Payer: Self-pay | Admitting: Family Medicine

## 2012-12-09 ENCOUNTER — Other Ambulatory Visit: Payer: Self-pay

## 2012-12-09 MED ORDER — PROMETHAZINE HCL 25 MG PO TABS: 25.0000 mg | ORAL_TABLET | Freq: Four times a day (QID) | ORAL | Status: DC | PRN

## 2012-12-11 NOTE — Telephone Encounter (Signed)
LMOM to advise pt

## 2012-12-17 ENCOUNTER — Encounter: Payer: Self-pay | Admitting: Family Medicine

## 2012-12-17 ENCOUNTER — Ambulatory Visit (INDEPENDENT_AMBULATORY_CARE_PROVIDER_SITE_OTHER): Payer: Self-pay | Admitting: Family Medicine

## 2012-12-17 VITALS — BP 130/76 | HR 68 | Temp 98.2°F | Resp 16 | Ht 64.0 in | Wt 171.6 lb

## 2012-12-17 DIAGNOSIS — Z8742 Personal history of other diseases of the female genital tract: Secondary | ICD-10-CM

## 2012-12-17 DIAGNOSIS — Z79890 Hormone replacement therapy (postmenopausal): Secondary | ICD-10-CM

## 2012-12-17 DIAGNOSIS — I10 Essential (primary) hypertension: Secondary | ICD-10-CM

## 2012-12-17 DIAGNOSIS — E663 Overweight: Secondary | ICD-10-CM

## 2012-12-17 DIAGNOSIS — Z760 Encounter for issue of repeat prescription: Secondary | ICD-10-CM

## 2012-12-17 DIAGNOSIS — J309 Allergic rhinitis, unspecified: Secondary | ICD-10-CM

## 2012-12-17 MED ORDER — ESOMEPRAZOLE MAGNESIUM 40 MG PO CPDR: 40.0000 mg | DELAYED_RELEASE_CAPSULE | Freq: Every day | ORAL | Status: DC

## 2012-12-17 MED ORDER — AMITRIPTYLINE HCL 25 MG PO TABS: 25.0000 mg | ORAL_TABLET | Freq: Every day | ORAL | Status: DC

## 2012-12-17 MED ORDER — TEMAZEPAM 30 MG PO CAPS: 30.0000 mg | ORAL_CAPSULE | Freq: Every evening | ORAL | Status: DC | PRN

## 2012-12-17 MED ORDER — ALPRAZOLAM 1 MG PO TABS: ORAL_TABLET | ORAL | Status: DC

## 2012-12-17 MED ORDER — ESTRADIOL 0.06 MG/24HR TD PTWK: 1.0000 | MEDICATED_PATCH | TRANSDERMAL | Status: DC

## 2012-12-17 MED ORDER — OMEGA-3-ACID ETHYL ESTERS 1 G PO CAPS: 2.0000 g | ORAL_CAPSULE | Freq: Two times a day (BID) | ORAL | Status: DC

## 2012-12-17 MED ORDER — SERTRALINE HCL 100 MG PO TABS: ORAL_TABLET | ORAL | Status: DC

## 2012-12-17 MED ORDER — LOSARTAN POTASSIUM 100 MG PO TABS: 100.0000 mg | ORAL_TABLET | Freq: Every day | ORAL | Status: DC

## 2012-12-17 MED ORDER — FEXOFENADINE-PSEUDOEPHED ER 60-120 MG PO TB12: 1.0000 | ORAL_TABLET | Freq: Every day | ORAL | Status: DC

## 2012-12-17 MED ORDER — MEDROXYPROGESTERONE ACETATE 2.5 MG PO TABS: 2.5000 mg | ORAL_TABLET | Freq: Every day | ORAL | Status: DC

## 2012-12-17 NOTE — Progress Notes (Signed)
S:  This 62 y.o. Cauc female has HTN, Type II DM- diet controlled and lipid disorder (suspect Metabolic Syndrome), chronic anxiety and depression and allergic rhinitis. She has missed several appts but did explain today that she has started a new job at end of June 2014 and did not want to be absent from work so soon after starting. 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 She thinks her health has improved overall with this change; her BP is more stable and controlled.  Pt was seeing another physician for weight loss medication (Phentermine). She states that he wanted her to switch her primary care to his office so she will not be returning to his care though the medication did seem to help curb appetite. She reports no adverse effects w/ this medication. (I advised her that I do not prescribe this medication for weight loss).  Pt needs hormones refilled; she was previously advised to sch GYN appt; however, she would like to referred to another GYN for second opinion. GYwho diagnosed uterine prolapse has mentioned surgery but is not willing to proceed with surgical procedure. Pt would like a definitive procedure instead of temporizing with medication; she states that symptoms are worsening. She would like to be referred to different GYN early next year but will continue current medications for now.  Chronic anxiety and depression are stable; husband chronic health issues continue to be a concern. Pt has severe degenerative hip disease and needs THR but this is not in plans for near future given his hx of CVA and overall disabled condition. He is fairly independent at home so that pt can work full time. Her sleep hygiene is fair. She awakens several times at night and glances at clock to check the time. She can get back to sleep w/o much difficulty.  PMHx, Soc Hx and Problem List reviewed.  ROS: As per HPI; otherwise  noncontributory.  O: Filed Vitals:   12/17/12 1622  BP: 130/76  Pulse: 68  Temp: 98.2 F (36.8 C)  Resp: 16   GEN: In NAD; WN,WD. Appears tired. HENT: [place]/AT; EOMI w/ clear conj/ sclerae. Otherwise unremarkable. COR: RRR. No edema. LUNGS: Normal resp rate and effort. SKIN: W&D; erythema w/o pallor. MS: MAEs; no c/c/e. NEURO: A&Ox 3; CNs intact, Nonfocal. PSYCH: Flat affect; pleasant and calm w/o agitation or lability. Speech pattern and thought content normal. Judgement sound.   A/P: HTN (hypertension)  Postmenopausal HRT (hormone replacement therapy) - Plan: estradiol (CLIMARA) 0.06 MG/24HR, medroxyPROGESTERone (PROVERA) 2.5 MG tablet  Hx of uterine prolapse- Will refer to GYN in early 2015 for second opinion and definitive treatment.  Allergic rhinitis, cause unspecified- Continue current medications.  Overweight (BMI 25.0-29.9)- Pt to discontinue use of Phentemine.  Medication refill  Meds ordered this encounter  Medications  . DISCONTD: phentermine 37.5 MG capsule    Sig: Take 37.5 mg by mouth every morning.  Marland Kitchen estradiol (CLIMARA) 0.06 MG/24HR    Sig: Place 1 patch onto the skin once a week.    Dispense:  4 patch    Refill:  3  . fexofenadine-pseudoephedrine (ALLEGRA-D) 60-120 MG per tablet    Sig: Take 1 tablet by mouth daily.    Dispense:  30 tablet    Refill:  5  . amitriptyline (ELAVIL) 25 MG tablet    Sig: Take 1 tablet (25 mg total) by mouth at bedtime.    Dispense:  30 tablet  Refill:  5  . esomeprazole (NEXIUM) 40 MG capsule    Sig: Take 1 capsule (40 mg total) by mouth daily before breakfast.    Dispense:  90 capsule    Refill:  3  . ALPRAZolam (XANAX) 1 MG tablet    Sig: TAKE ONE TABLET BY MOUTH AT MID-DAY, AND ONE TABLET IN THE EVENING    Dispense:  60 tablet    Refill:  1    Do not fill before Sept 9, 2014.  . omega-3 acid ethyl esters (LOVAZA) 1 G capsule    Sig: Take 2 capsules (2 g total) by mouth 2 (two) times daily.    Dispense:  360  capsule    Refill:  3  . losartan (COZAAR) 100 MG tablet    Sig: Take 1 tablet (100 mg total) by mouth daily.    Dispense:  30 tablet    Refill:  5  . medroxyPROGESTERone (PROVERA) 2.5 MG tablet    Sig: Take 1 tablet (2.5 mg total) by mouth daily.    Dispense:  90 tablet    Refill:  1  . temazepam (RESTORIL) 30 MG capsule    Sig: Take 1 capsule (30 mg total) by mouth at bedtime as needed for sleep.    Dispense:  30 capsule    Refill:  1  . sertraline (ZOLOFT) 100 MG tablet    Sig: Take 1 1/2 tablets every day as directed.    Dispense:  45 tablet    Refill:  5

## 2013-01-12 ENCOUNTER — Other Ambulatory Visit: Payer: Self-pay | Admitting: Family Medicine

## 2013-01-13 NOTE — Telephone Encounter (Signed)
I called the pharmacy and advised that pt was given printed RX for Alprazolam last month with note that it could not be filled before 01/14/13. Pharmacy will call pt and remind her that she has printed RX.

## 2013-02-22 ENCOUNTER — Other Ambulatory Visit: Payer: Self-pay | Admitting: Family Medicine

## 2013-03-06 ENCOUNTER — Other Ambulatory Visit: Payer: Self-pay | Admitting: Family Medicine

## 2013-03-08 NOTE — Telephone Encounter (Signed)
Alprazolam refill phoned to pt's pharmacy (#60 w/ 1 refill).

## 2013-03-09 ENCOUNTER — Other Ambulatory Visit: Payer: Self-pay | Admitting: Family Medicine

## 2013-03-11 ENCOUNTER — Ambulatory Visit: Payer: Self-pay

## 2013-03-13 ENCOUNTER — Other Ambulatory Visit: Payer: Self-pay

## 2013-03-18 NOTE — Telephone Encounter (Signed)
Alprazolam was phoned to pt's pharmacy on 03/06/13.

## 2013-04-01 ENCOUNTER — Other Ambulatory Visit: Payer: Self-pay | Admitting: Family Medicine

## 2013-04-01 NOTE — Telephone Encounter (Signed)
I don't see a diagnosis in the record for this medication.  Please get details.

## 2013-04-02 NOTE — Telephone Encounter (Signed)
Pt called back and stated that the phenergan was first Rxd by Dr Allena Katz, her kidney specialist, for her chronic kidney disease which causes her to be nauseous quite often. Dr Allena Katz prefers that her medications all be Rxd by the same MD and this is why Dr Audria Nine Rxd it for her at her last OV. Pt has f/up appt scheduled for 05/13/13.

## 2013-04-02 NOTE — Telephone Encounter (Signed)
LMOM to CB to get details.

## 2013-04-08 ENCOUNTER — Other Ambulatory Visit: Payer: Self-pay | Admitting: Family Medicine

## 2013-05-05 ENCOUNTER — Other Ambulatory Visit: Payer: Self-pay | Admitting: Physician Assistant

## 2013-05-05 ENCOUNTER — Other Ambulatory Visit: Payer: Self-pay | Admitting: Family Medicine

## 2013-05-06 NOTE — Telephone Encounter (Signed)
Pt has appt sch for 05/13/13.

## 2013-05-06 NOTE — Telephone Encounter (Signed)
Alprazolam refill (#60 w/ no refills) phoned to pt's pharmacy. 

## 2013-05-12 ENCOUNTER — Other Ambulatory Visit: Payer: Self-pay | Admitting: Family Medicine

## 2013-05-13 ENCOUNTER — Ambulatory Visit: Payer: Self-pay | Admitting: Family Medicine

## 2013-05-13 ENCOUNTER — Encounter: Payer: Self-pay | Admitting: Family Medicine

## 2013-05-13 VITALS — BP 130/90 | HR 86 | Temp 98.9°F | Resp 16 | Ht 64.5 in | Wt 174.0 lb

## 2013-05-13 DIAGNOSIS — H9209 Otalgia, unspecified ear: Secondary | ICD-10-CM

## 2013-05-13 DIAGNOSIS — E785 Hyperlipidemia, unspecified: Secondary | ICD-10-CM

## 2013-05-13 DIAGNOSIS — N189 Chronic kidney disease, unspecified: Secondary | ICD-10-CM

## 2013-05-13 DIAGNOSIS — F411 Generalized anxiety disorder: Secondary | ICD-10-CM

## 2013-05-13 DIAGNOSIS — H9201 Otalgia, right ear: Secondary | ICD-10-CM

## 2013-05-13 DIAGNOSIS — E119 Type 2 diabetes mellitus without complications: Secondary | ICD-10-CM

## 2013-05-13 DIAGNOSIS — I10 Essential (primary) hypertension: Secondary | ICD-10-CM

## 2013-05-13 LAB — POCT GLYCOSYLATED HEMOGLOBIN (HGB A1C): Hemoglobin A1C: 5.9

## 2013-05-13 MED ORDER — PROMETHAZINE HCL 25 MG PO TABS: 25.0000 mg | ORAL_TABLET | Freq: Four times a day (QID) | ORAL | Status: DC | PRN

## 2013-05-13 MED ORDER — ESOMEPRAZOLE MAGNESIUM 40 MG PO CPDR: 40.0000 mg | DELAYED_RELEASE_CAPSULE | Freq: Every day | ORAL | Status: DC

## 2013-05-13 MED ORDER — CEFUROXIME AXETIL 500 MG PO TABS: 500.0000 mg | ORAL_TABLET | Freq: Two times a day (BID) | ORAL | Status: DC

## 2013-05-13 MED ORDER — SERTRALINE HCL 100 MG PO TABS: ORAL_TABLET | ORAL | Status: DC

## 2013-05-13 MED ORDER — OMEGA-3-ACID ETHYL ESTERS 1 G PO CAPS: 2.0000 g | ORAL_CAPSULE | Freq: Two times a day (BID) | ORAL | Status: DC

## 2013-05-13 MED ORDER — ALPRAZOLAM 1 MG PO TABS: ORAL_TABLET | ORAL | Status: DC

## 2013-05-14 ENCOUNTER — Encounter: Payer: Self-pay | Admitting: Family Medicine

## 2013-05-14 LAB — COMPLETE METABOLIC PANEL WITH GFR
ALK PHOS: 45 U/L (ref 39–117)
ALT: 14 U/L (ref 0–35)
AST: 17 U/L (ref 0–37)
Albumin: 4.3 g/dL (ref 3.5–5.2)
BILIRUBIN TOTAL: 0.3 mg/dL (ref 0.3–1.2)
BUN: 18 mg/dL (ref 6–23)
CO2: 28 mEq/L (ref 19–32)
Calcium: 9.5 mg/dL (ref 8.4–10.5)
Chloride: 102 mEq/L (ref 96–112)
Creat: 1.02 mg/dL (ref 0.50–1.10)
GFR, EST NON AFRICAN AMERICAN: 60 mL/min
GFR, Est African American: 70 mL/min
Glucose, Bld: 106 mg/dL — ABNORMAL HIGH (ref 70–99)
Potassium: 5 mEq/L (ref 3.5–5.3)
Sodium: 139 mEq/L (ref 135–145)
Total Protein: 7 g/dL (ref 6.0–8.3)

## 2013-05-14 LAB — LIPID PANEL
CHOL/HDL RATIO: 4.4 ratio
Cholesterol: 212 mg/dL — ABNORMAL HIGH (ref 0–200)
HDL: 48 mg/dL (ref >39)
LDL Cholesterol: 113 mg/dL — ABNORMAL HIGH (ref 0–99)
Triglycerides: 255 mg/dL — ABNORMAL HIGH (ref <150)
VLDL: 51 mg/dL — ABNORMAL HIGH (ref 0–40)

## 2013-05-14 NOTE — Progress Notes (Addendum)
S:  This 63 y.o. Cauc female is here for DM follow-up; this medical problem is diet-controlled. She does not do FSBS. Weight is up 2 lbs since April 2014. Appetite is fair; pt denies diaphoresis, polydipsia or polyphagia. Renal impairment related to this problem as well as chronic HTN.  Medication compliance with antihypertensives is excellent. Pt denies CP or tightness, palpitations, SOB or DOE, cough, numbness, weakness, HA, dizziness or syncope.  Pt is still stressed w/ work issues and financial stressors. Alprazolam enables her to rest well at night. No reports of oversedation, somnulence, agitation or behavior changes. Pt has hx of chronic depression but is not in counseling. She reports being "really down" in Oct into November. She had reduced Sertraline dose to stretch medication. She is back on full dose now.   Pt c/o R ear pain, swelling and redness, especially behind ear. Bone behind ear is tender and neck feels slightly swollen. Mild sore throat on right side w/ low grade fever but no chills. No rhinorrhea, epistaxis, dysphagia or cough. Limited exposure to ill persons.  Patient Active Problem List   Diagnosis Date Noted  . Allergic rhinitis 05/09/2012  . Caregiver stress 07/04/2011  . DENTAL CARIES 11/22/2007  . HYPERTENSION 07/26/2007  . FATIGUE 07/26/2007  . Diabetes Mellitus- diet controlled ( 40 lbs. weight loss) 11/20/2006    Class: History of  . HYPERLIPIDEMIA 11/20/2006  . ANXIETY STATE NOS 11/20/2006  . RENAL INSUFFICIENCY, CHRONIC 11/20/2006  . 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 DIABETIC PERIPHERAL NEUROPATHY 02/21/2006  . PANCREATITIS, HX OF 06/08/2005   PMHx, Soc and Fam Hx reviewed.  Medications reconciled.  ROS: As per HPI.  O: Filed Vitals:   05/13/13 1628  BP: 130/90  Pulse: 86  Temp: 98.9 F (37.2 C)  Resp: 16   GEN: In NAD: WN,WD. HEENT: [place]/AT; EOMI w/ clear conj/sclerae. Nares clear. Post ph erythematous- R>L w/o exudate, lesions or  masses. L ear- normal canal w/ dull TM. R ear- ext ear ertyhematous and warm to touch; mild erythema with dull TM.  NECK: R posterior node immediately behind ear slightly enlarged and tender. No anterior cervical or occipital nodes palpable. LUNGS: CTA; no wheezes or rhonchi. COR: RRR w/ normal S1 and S2. SKIN: W&D; intact w/o diaphoresis. Pallor noted.  See DM Foot exam. NEURO: A&O x 3; CNs intact. Nonfocal. PSYCH: Anxious and talkatve. No inappropriate behavior.  A1c= 5.9%   A/P: Type II or unspecified type diabetes mellitus without mention of complication, not stated as uncontrolled -  Diet controlled. No changes in management. Plan: HM Diabetes Foot Exam, POCT glycosylated hemoglobin (Hb A1C)  RENAL INSUFFICIENCY, CHRONIC - Plan: COMPLETE METABOLIC PANEL WITH GFR  HYPERTENSION - Stable; continue current medications. Plan: COMPLETE METABOLIC PANEL WITH GFR  HYPERLIPIDEMIA - Plan: Lipid panel  ANXIETY STATE NOS- Continue Sertraline 150 mg daily and Alprazolam hs prn for sleep.  Otalgia of right ear- RX: Cefuroxime 500 mg 1 tablet bid x 10 days.  Meds ordered this encounter  Medications  . omega-3 acid ethyl esters (LOVAZA) 1 G capsule    Sig: Take 2 capsules (2 g total) by mouth 2 (two) times daily.    Dispense:  360 capsule    Refill:  3  . esomeprazole (NEXIUM) 40 MG capsule    Sig: Take 1 capsule (40 mg total) by mouth daily before breakfast.    Dispense:  90 capsule    Refill:  3  . sertraline (ZOLOFT) 100 MG tablet  Sig: TAKE ONE & ONE-HALF TABLETS BY MOUTH ONCE DAILY AS DIRECTED    Dispense:  45 tablet    Refill:  5  . promethazine (PHENERGAN) 25 MG tablet    Sig: Take 1 tablet (25 mg total) by mouth every 6 (six) hours as needed.    Dispense:  30 tablet    Refill:  2  . cefUROXime (CEFTIN) 500 MG tablet    Sig: Take 1 tablet (500 mg total) by mouth 2 (two) times daily with a meal.    Dispense:  20 tablet    Refill:  0  . ALPRAZolam (XANAX) 1 MG tablet     Sig: TAKE 1 TABLET BY MOUTH AT MID-DAY AND 1 TABLET IN THE EVENING    Dispense:  60 tablet    Refill:  1    Do not fill before Jun 06, 2013; 60 tabs needs to last 30 days.

## 2013-05-29 ENCOUNTER — Telehealth: Payer: Self-pay

## 2013-05-29 ENCOUNTER — Other Ambulatory Visit: Payer: Self-pay | Admitting: Physician Assistant

## 2013-05-29 NOTE — Telephone Encounter (Signed)
Dr. Audria NineMcPherson:  Patient called to let you know that she finished her antibiotics and she thinks she needs something else because se is not feeling better.  Please advise [phone]

## 2013-05-30 MED ORDER — CEFUROXIME AXETIL 500 MG PO TABS: 500.0000 mg | ORAL_TABLET | Freq: Two times a day (BID) | ORAL | Status: DC

## 2013-05-30 NOTE — Telephone Encounter (Signed)
I have refilled the same antibiotic for an additional week. If not better after taking it, make appt to see me. If sudden worsing, she needs to be seen at [facility] this weekend.

## 2013-05-30 NOTE — Telephone Encounter (Signed)
Climara refilled until CPE visit.

## 2013-05-30 NOTE — Telephone Encounter (Signed)
Called her left detailed message asking her to call me back, if she has questions.

## 2013-05-30 NOTE — Telephone Encounter (Signed)
Can we RF this until you asked pt to RTC for CPE in 4 mos?

## 2013-07-02 ENCOUNTER — Other Ambulatory Visit: Payer: Self-pay | Admitting: Family Medicine

## 2013-07-02 NOTE — Telephone Encounter (Signed)
Temazepam refill #30 w/ 1 RF phoned to pt's pharmacy.

## 2013-07-11 ENCOUNTER — Telehealth: Payer: Self-pay | Admitting: Radiology

## 2013-07-11 DIAGNOSIS — Z8719 Personal history of other diseases of the digestive system: Secondary | ICD-10-CM

## 2013-07-11 NOTE — Telephone Encounter (Signed)
I have placed a referral as requested by pt; please advise her.

## 2013-07-11 NOTE — Telephone Encounter (Signed)
Patient states her teeth are chipping/breaking off  and her gums are bleeding she [facility] the number there is [phone] , pt call back number is  [phone]. Please advise, patient states she must have the referral before she goes there.

## 2013-07-14 NOTE — Telephone Encounter (Signed)
Advised pt

## 2013-07-23 ENCOUNTER — Other Ambulatory Visit: Payer: Self-pay | Admitting: Family Medicine

## 2013-07-31 ENCOUNTER — Ambulatory Visit (INDEPENDENT_AMBULATORY_CARE_PROVIDER_SITE_OTHER): Payer: Self-pay | Admitting: Family Medicine

## 2013-07-31 VITALS — BP 160/95 | HR 85 | Temp 97.8°F | Resp 18 | Ht 64.5 in | Wt 174.6 lb

## 2013-07-31 DIAGNOSIS — K0889 Other specified disorders of teeth and supporting structures: Secondary | ICD-10-CM

## 2013-07-31 DIAGNOSIS — I10 Essential (primary) hypertension: Secondary | ICD-10-CM

## 2013-07-31 DIAGNOSIS — K029 Dental caries, unspecified: Secondary | ICD-10-CM

## 2013-07-31 DIAGNOSIS — K089 Disorder of teeth and supporting structures, unspecified: Secondary | ICD-10-CM

## 2013-07-31 MED ORDER — PENICILLIN V POTASSIUM 500 MG PO TABS: 500.0000 mg | ORAL_TABLET | Freq: Three times a day (TID) | ORAL | Status: DC

## 2013-07-31 NOTE — Progress Notes (Signed)
Chief Complaint:  Chief Complaint  Patient presents with  . Dental Pain    Top set of teeth have "crumbled" over the last 6 weeks, feel like she might have some type of infection    HPI: Dana Romero is a 63 y.o. female who is here for dental pain and possible infection. She has had low grade fevers and chills. She does not take tylenol and has been taking ibuprofen. She was started in January and/or February on a round of antibiotics and improved, then called back and it seemed all the infection was coming back into her gums.  T 99-100. Her "teeth are crumbling" She was not able to get in to see Dr Audria NineMcPherson for re-eval  She is in the process of getting referral to dental school for dentures and she and her have been working on this  She is diabetic, has HTN and chronic kidney disease.   Past Medical History  Diagnosis Date  . Hypertension 1998  . Chronic kidney disease     PER PT  . Depression   . Sinusitis, chronic   . Anxiety   . Diabetes mellitus 1998    off meds since 2009 or 2010   Past Surgical History  Procedure Laterality Date  . Appendectomy    . Breast surgery      breast cyst  . Tonsillectomy    . Tubal ligation      post partum  . Tonsillectomy and adenoidectomy     History   Social History  . Marital Status: Divorced    Spouse Name: N/A    Number of Children: N/A  . Years of Education: N/A   Occupational History  . nurse tech    Social History Main Topics  . Smoking status: Never Smoker   . Smokeless tobacco: Never Used  . Alcohol Use: No  . Drug Use: No  . Sexual Activity: Not Currently    Partners: Male   Other Topics Concern  . None   Social History Narrative  . None   Family History  Problem Relation Age of Onset  . Hypertension Mother   . Depression Mother   . Diabetes Maternal Aunt   . Breast cancer Maternal Aunt   . Diabetes Maternal Uncle   . Hypertension Maternal Uncle   . Diabetes Maternal Grandmother   .  Hypertension Maternal Grandmother   . Breast cancer Maternal Grandmother   . Heart attack Maternal Grandmother   . Hypertension Maternal Uncle   . Hypertension Brother   . Stroke Other    Allergies  Allergen Reactions  . Amlodipine Besylate     REACTION: intolerance  . Atorvastatin     REACTION: intolerance  . Ezetimibe-Simvastatin     REACTION: intolerance  . Losartan Potassium-Hctz   . Niacin     REACTION: intolerance  . Rosuvastatin     REACTION: intolerance  . Toprol Xl [Metoprolol Succinate] Other (See Comments)    Makes her feel weak  . Latex Rash   Prior to Admission medications   Medication Sig Start Date End Date Taking? Authorizing Provider  ALPRAZolam Prudy Feeler(XANAX) 1 MG tablet TAKE 1 TABLET BY MOUTH AT MID-DAY AND 1 TABLET IN THE EVENING 05/13/13  Yes Maurice MarchBarbara B McPherson, MD  amitriptyline (ELAVIL) 25 MG tablet Take 1 tablet (25 mg total) by mouth at bedtime. 12/17/12  Yes Maurice MarchBarbara B McPherson, MD  carvedilol (COREG) 25 MG tablet TAKE ONE TABLET BY MOUTH TWICE DAILY WITH  A MEAL 07/23/13  Yes Maurice March, MD  colchicine 0.6 MG tablet Take 0.6 mg by mouth daily.   Yes Historical Provider, MD  diphenhydrAMINE (BENADRYL) 25 mg capsule Take 25 mg by mouth at bedtime.   Yes Historical Provider, MD  EPINEPHrine (EPI-PEN) 0.3 mg/0.3 mL DEVI Inject 0.3 mg into the muscle once.     Yes Historical Provider, MD  esomeprazole (NEXIUM) 40 MG capsule Take 1 capsule (40 mg total) by mouth daily before breakfast. 05/13/13  Yes Maurice March, MD  estradiol [facility]) 0.06 MG/24HR APPLY ONE PATCH TOPICALLY ONCE A WEEK 05/29/13  Yes Maurice March, MD  fexofenadine-pseudoephedrine (ALLEGRA-D) 60-120 MG per tablet Take 1 tablet by mouth daily. 12/17/12  Yes Maurice March, MD  furosemide (LASIX) 20 MG tablet Take 1 tablet (20 mg total) by mouth 2 (two) times daily. 05/09/12  Yes Maurice March, MD  losartan (COZAAR) 100 MG tablet Take 1 tablet (100 mg total) by mouth daily.  12/17/12  Yes Maurice March, MD  magnesium oxide (MAG-OX) 400 MG tablet Take 1 tablet (400 mg total) by mouth 2 (two) times daily. 07/21/12  Yes Maurice March, MD  medroxyPROGESTERone (PROVERA) 2.5 MG tablet Take 1 tablet (2.5 mg total) by mouth daily. 12/17/12  Yes Maurice March, MD  methocarbamol (ROBAXIN) 500 MG tablet TAKE ONE TABLET BY MOUTH AT BEDTIME AS NEEDED 07/23/13  Yes Maurice March, MD  omega-3 acid ethyl esters (LOVAZA) 1 G capsule Take 2 capsules (2 g total) by mouth 2 (two) times daily. 05/13/13  Yes Maurice March, MD  OVER THE COUNTER MEDICATION Garlic 3000mg  q am, 2000mg  q pm, 2000mg  qhs    Yes Historical Provider, MD  potassium chloride (KLOR-CON 10) 10 MEQ tablet Take 1 tablet by mouth daily. 10/17/12  Yes Maurice March, MD  promethazine (PHENERGAN) 25 MG tablet Take 1 tablet (25 mg total) by mouth every 6 (six) hours as needed. 05/13/13  Yes Maurice March, MD  sertraline (ZOLOFT) 100 MG tablet TAKE ONE & ONE-HALF TABLETS BY MOUTH ONCE DAILY AS DIRECTED 05/13/13  Yes Maurice March, MD  temazepam (RESTORIL) 30 MG capsule TAKE ONE CAPSULE BY MOUTH AT BEDTIME AS NEEDED FOR SLEEP 07/02/13  Yes Maurice March, MD  triamcinolone (NASACORT) 55 MCG/ACT nasal inhaler Place 2 sprays into the nose daily. 09/03/12  Yes Maurice March, MD     ROS: The patient denies night sweats, unintentional weight loss, chest pain, palpitations, wheezing, dyspnea on exertion, nausea, vomiting, abdominal pain, dysuria, hematuria, melena, numbness, weakness, or tingling.   All other systems have been reviewed and were otherwise negative with the exception of those mentioned in the HPI and as above.    PHYSICAL EXAM: Filed Vitals:   07/31/13 1838  BP: 160/95  Pulse: 85  Temp: 97.8 F (36.6 C)  Resp: 18   Filed Vitals:   07/31/13 1838  Height: 5' 4.5" (1.638 m)  Weight: 174 lb 9.6 oz (79.198 kg)   Body mass index is 29.52 kg/(m^2).  General: Alert,  no acute distress, anxious caucasian female HEENT:  Normocephalic, atraumatic, oropharynx patent. EOMI, PERRLA + dental caries. + erythematous upper gums, minimal gingivitis in appearance.TM nl Cardiovascular:  Regular rate and rhythm, no rubs murmurs or gallops.  No Carotid bruits, radial pulse intact. No pedal edema.  Respiratory: Clear to auscultation bilaterally.  No wheezes, rales, or rhonchi.  No cyanosis, no use of accessory musculature GI: No organomegaly, abdomen  is soft and non-tender, positive bowel sounds.  No masses. Skin: No rashes. Neurologic: Facial musculature symmetric. Psychiatric: Patient is appropriate throughout our interaction. Lymphatic: No cervical lymphadenopathy Musculoskeletal: Gait intact.   LABS: Results for orders placed in visit on 05/13/13  COMPLETE METABOLIC PANEL WITH GFR      Result Value Ref Range   Sodium 139  135 - 145 mEq/L   Potassium 5.0  3.5 - 5.3 mEq/L   Chloride 102  96 - 112 mEq/L   CO2 28  19 - 32 mEq/L   Glucose, Bld 106 (*) 70 - 99 mg/dL   BUN 18  6 - 23 mg/dL   Creat 5.28  4.13 - 2.44 mg/dL   Total Bilirubin 0.3  0.3 - 1.2 mg/dL   Alkaline Phosphatase 45  39 - 117 U/L   AST 17  0 - 37 U/L   ALT 14  0 - 35 U/L   Total Protein 7.0  6.0 - 8.3 g/dL   Albumin 4.3  3.5 - 5.2 g/dL   Calcium 9.5  8.4 - 01.0 mg/dL   GFR, Est African American 70     GFR, Est Non African American 60    LIPID PANEL      Result Value Ref Range   Cholesterol 212 (*) 0 - 200 mg/dL   Triglycerides 272 (*) <150 mg/dL   HDL 48  >53 mg/dL   Total CHOL/HDL Ratio 4.4     VLDL 51 (*) 0 - 40 mg/dL   LDL Cholesterol 664 (*) 0 - 99 mg/dL  POCT GLYCOSYLATED HEMOGLOBIN (HGB A1C)      Result Value Ref Range   Hemoglobin A1C 5.9       EKG/XRAY:   Primary read interpreted by Dr. Conley Rolls at [facility].   ASSESSMENT/PLAN: Encounter Diagnoses  Name Primary?  . Dental caries Yes  . Tooth pain   . HTN (hypertension)    Go home and take BP meds since she has not had her  evening dose Rx PCN 500 mg TID for possible gum infection F/u prn  Gross sideeffects, risk and benefits, and alternatives of medications d/w patient. Patient is aware that all medications have potential sideeffects and we are unable to predict every sideeffect or drug-drug interaction that may occur.  Hamilton Capri PHUONG, DO 07/31/2013 7:47 PM

## 2013-08-05 ENCOUNTER — Other Ambulatory Visit: Payer: Self-pay | Admitting: Family Medicine

## 2013-08-05 NOTE — Telephone Encounter (Signed)
Alprazolam refill phoned to pt's pharmacy. She has an OV scheduled at end of April.

## 2013-08-11 ENCOUNTER — Other Ambulatory Visit: Payer: Self-pay | Admitting: Physician Assistant

## 2013-08-14 ENCOUNTER — Other Ambulatory Visit: Payer: Self-pay

## 2013-09-04 ENCOUNTER — Ambulatory Visit (INDEPENDENT_AMBULATORY_CARE_PROVIDER_SITE_OTHER): Payer: Self-pay | Admitting: Family Medicine

## 2013-09-04 ENCOUNTER — Ambulatory Visit: Payer: Self-pay

## 2013-09-04 ENCOUNTER — Encounter: Payer: Self-pay | Admitting: Family Medicine

## 2013-09-04 VITALS — BP 150/98 | HR 85 | Temp 98.2°F | Resp 18 | Ht 64.5 in | Wt 176.0 lb

## 2013-09-04 DIAGNOSIS — I10 Essential (primary) hypertension: Secondary | ICD-10-CM

## 2013-09-04 DIAGNOSIS — R0989 Other specified symptoms and signs involving the circulatory and respiratory systems: Secondary | ICD-10-CM

## 2013-09-04 DIAGNOSIS — R05 Cough: Secondary | ICD-10-CM

## 2013-09-04 DIAGNOSIS — K029 Dental caries, unspecified: Secondary | ICD-10-CM

## 2013-09-04 DIAGNOSIS — R0600 Dyspnea, unspecified: Secondary | ICD-10-CM

## 2013-09-04 DIAGNOSIS — R059 Cough, unspecified: Secondary | ICD-10-CM

## 2013-09-04 DIAGNOSIS — R0609 Other forms of dyspnea: Secondary | ICD-10-CM

## 2013-09-04 MED ORDER — LOSARTAN POTASSIUM 100 MG PO TABS: 100.0000 mg | ORAL_TABLET | Freq: Every day | ORAL | Status: DC

## 2013-09-04 MED ORDER — CEFTRIAXONE SODIUM 1 G IJ SOLR
500.0000 mg | Freq: Once | INTRAMUSCULAR | Status: AC
  Administered 2013-09-04: 500 mg via INTRAMUSCULAR

## 2013-09-04 MED ORDER — EPINEPHRINE 0.3 MG/0.3ML IJ SOAJ: 0.3000 mg | Freq: Once | INTRAMUSCULAR | Status: DC

## 2013-09-04 MED ORDER — METHYLPREDNISOLONE ACETATE 80 MG/ML IJ SUSP
120.0000 mg | Freq: Once | INTRAMUSCULAR | Status: AC
  Administered 2013-09-04: 120 mg via INTRAMUSCULAR

## 2013-09-04 MED ORDER — CEFUROXIME AXETIL 500 MG PO TABS: 500.0000 mg | ORAL_TABLET | Freq: Two times a day (BID) | ORAL | Status: DC

## 2013-09-04 NOTE — Progress Notes (Signed)
Subjective:    Patient ID: Dana Romero, female    DOB: 08/17/1954, 63 y.o.   MRN: [id number]  Diabetes Hypoglycemia symptoms include dizziness, headaches and nervousness/anxiousness. Associated symptoms include fatigue. Pertinent negatives for diabetes include no chest pain and no weakness.  Cough Associated symptoms include chills, a fever, headaches, a sore throat and shortness of breath. Pertinent negatives include no chest pain or wheezing.   S:  This 63 y.o. Cauc female is here for DM follow-up; She takes no oral agents but controls disease with nutrition modification. Dr. Allena KatzPatel , her nephrologist, has advised her that she probably has more hypoglycemic episodes related to abnormal glucose metabolism. Recently her sugars have been low due to poor intake (dental caries and broken teeth). She is consuming soups and smoothies.  She c/o fever to 101, NP cough w/ dyspnea and pain in L flank. Onset 2 nights ago. Also has R ear pain, sore throat and fatigue. Ibuprofen relieved back pain. She reports last time she felt like this, she had pneumonia.  HTN- Pt is compliant w/ medication w/o adverse effects. She reports mild ankle edema; it is not present today. Prior to 2 days ago, she had no CP or tightness, palpitations, SOB or cough, orthopnea, HA, dizziness or syncope.  Patient Active Problem List   Diagnosis Date Noted  . Allergic rhinitis 05/09/2012  . Caregiver stress 07/04/2011  . DENTAL CARIES 11/22/2007  . HYPERTENSION 07/26/2007  . FATIGUE 07/26/2007  . Diabetes Mellitus- diet controlled ( 40 lbs. weight loss) 11/20/2006    Class: History of  . HYPERLIPIDEMIA 11/20/2006  . ANXIETY STATE NOS 11/20/2006  . RENAL INSUFFICIENCY, CHRONIC 11/20/2006  . 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 DIABETIC PERIPHERAL NEUROPATHY 02/21/2006  . PANCREATITIS, HX OF 06/08/2005    PMHX, Surg Hx, Soc and Fam HX reviewed.  MEDICATIONS reconciled.   Review of Systems    Constitutional: Positive for fever, chills, appetite change and fatigue. Negative for diaphoresis.  HENT: Positive for dental problem, sore throat and voice change. Negative for trouble swallowing.   Eyes: Negative.   Respiratory: Positive for cough, chest tightness and shortness of breath. Negative for wheezing.   Cardiovascular: Negative for chest pain and palpitations.  Gastrointestinal: Positive for nausea. Negative for vomiting and abdominal pain.  Skin: Negative.   Neurological: Positive for dizziness and headaches. Negative for syncope, weakness and numbness.  Psychiatric/Behavioral: The patient is nervous/anxious.        Tearful when discussing problem with dentition and no app[facility] (has no insurance)      Objective:   Physical Exam  Constitutional: She is oriented to person, place, and time. She appears well-developed and well-nourished. She does not appear ill.  HENT:  Head: Normocephalic and atraumatic.  Right Ear: Hearing, external ear and ear canal normal. Tympanic membrane is not erythematous and not retracted. A middle ear effusion is present.  Left Ear: Hearing and external ear normal. Tympanic membrane is scarred.  Nose: Nose normal. No nasal deformity or septal deviation.  Mouth/Throat: Uvula is midline. Mucous membranes are not pale, dry and not cyanotic. No oral lesions. Abnormal dentition. Dental caries present. Posterior oropharyngeal erythema present. No oropharyngeal exudate.  Eyes: EOM and lids are normal. Right conjunctiva is injected. Left conjunctiva is injected. No scleral icterus.  Pulmonary/Chest: No respiratory distress. She has decreased breath sounds in the right lower field and the left lower field. She has no wheezes. She has no rhonchi. She has no  rales.  Neurological: She is alert and oriented to person, place, and time. No cranial nerve deficit or sensory deficit. She exhibits normal muscle tone. Coordination and gait normal.  Skin:  Skin is warm, dry and intact. No ecchymosis and no rash noted. She is not diaphoretic. There is erythema. No cyanosis. No pallor.  Psychiatric: Her speech is normal and behavior is normal. Judgment and thought content normal. Her mood appears anxious. Her affect is not inappropriate. Cognition and memory are normal. She exhibits a depressed mood.   [facility] reading (PRIMARY) by  Dr. Audria NineMcPherson: CXR- Heart size is normal. Possible patchy infiltrate behind heart. No pleural effusion, mass or adenopathy.     Assessment & Plan:  Cough - Pt has hx of bronchitis (possibly asthmatic bronchitis).Plan: DG Chest 2 View, cefTRIAXone (ROCEPHIN) injection 500 mg, methylPREDNISolone acetate (DEPO-MEDROL) injection 120 mg  Dyspnea - Plan: DG Chest 2 View, cefTRIAXone (ROCEPHIN) injection 500 mg, methylPREDNISolone acetate (DEPO-MEDROL) injection 120 mg  HYPERTENSION- Stable; pt reports normal readings w/ ambulatory monitoring.  DENTAL CARIES- Pt [facility]; I will send a note to our referral coordinator to see if she can follow-up on this issue.  Meds ordered this encounter  Medications  . cefTRIAXone (ROCEPHIN) injection 500 mg    Sig:     Order Specific Question:  Antibiotic Indication:    Answer:  Other Indication (list below)    Order Specific Question:  Other Indication:    Answer:  Flank pain with fever, NP cough and SOB.  Marland Kitchen. methylPREDNISolone acetate (DEPO-MEDROL) injection 120 mg    Sig:   . EPINEPHrine (EPI-PEN) 0.3 mg/0.3 mL SOAJ injection    Sig: Inject 0.3 mLs (0.3 mg total) into the muscle once.    Dispense:  1 Device    Refill:  3  . cefUROXime (CEFTIN) 500 MG tablet    Sig: Take 1 tablet (500 mg total) by mouth 2 (two) times daily with a meal.    Dispense:  14 tablet    Refill:  0  . losartan (COZAAR) 100 MG tablet    Sig: Take 1 tablet (100 mg total) by mouth daily.    Dispense:  30 tablet    Refill:  5   Pt instructed to go to ED if symptoms worsen  overnight. Otherwise, she will return here tomorrow for re-evaluation. She understands.

## 2013-09-05 ENCOUNTER — Ambulatory Visit (INDEPENDENT_AMBULATORY_CARE_PROVIDER_SITE_OTHER): Payer: Self-pay | Admitting: Family Medicine

## 2013-09-05 ENCOUNTER — Encounter: Payer: Self-pay | Admitting: Family Medicine

## 2013-09-05 VITALS — BP 120/80 | HR 82 | Temp 98.1°F | Resp 16 | Ht 64.5 in | Wt 172.5 lb

## 2013-09-05 DIAGNOSIS — R059 Cough, unspecified: Secondary | ICD-10-CM

## 2013-09-05 DIAGNOSIS — J309 Allergic rhinitis, unspecified: Secondary | ICD-10-CM

## 2013-09-05 DIAGNOSIS — R05 Cough: Secondary | ICD-10-CM

## 2013-09-05 DIAGNOSIS — J302 Other seasonal allergic rhinitis: Secondary | ICD-10-CM

## 2013-09-05 DIAGNOSIS — J9801 Acute bronchospasm: Secondary | ICD-10-CM

## 2013-09-05 MED ORDER — ALBUTEROL SULFATE HFA 108 (90 BASE) MCG/ACT IN AERS: 2.0000 | INHALATION_SPRAY | Freq: Four times a day (QID) | RESPIRATORY_TRACT | Status: DC | PRN

## 2013-09-05 MED ORDER — ALBUTEROL SULFATE (2.5 MG/3ML) 0.083% IN NEBU
2.5000 mg | INHALATION_SOLUTION | Freq: Once | RESPIRATORY_TRACT | Status: AC
  Administered 2014-05-15: 2.5 mg via RESPIRATORY_TRACT

## 2013-09-05 NOTE — Patient Instructions (Signed)
Shortness of Breath Shortness of breath means you have trouble breathing. Shortness of breath may indicate that you have a medical problem. You should seek immediate medical care for shortness of breath. CAUSES   Not enough oxygen in the air (as with high altitudes or a smoke-filled room).  Short-term (acute) lung disease, including:  Infections, such as pneumonia.  Fluid in the lungs, such as heart failure.  A blood clot in the lungs (pulmonary embolism).  Long-term (chronic) lung diseases.  Heart disease (heart attack, angina, heart failure, and others).  Low red blood cells (anemia).  Poor physical fitness. This can cause shortness of breath when you exercise.  Chest or back injuries or stiffness.  Being overweight.  Smoking.  Anxiety. This can make you feel like you are not getting enough air. DIAGNOSIS  Serious medical problems can usually be found during your physical exam. Tests may also be done to determine why you are having shortness of breath. Tests may include:  Chest X-rays.  Lung function tests.  Blood tests.  Electrocardiography.  Exercise testing.  Echocardiography.  Imaging scans. Your caregiver may not be able to find a cause for your shortness of breath after your exam. In this case, it is important to have a follow-up exam with your caregiver as directed.  TREATMENT  Treatment for shortness of breath depends on the cause of your symptoms and can vary greatly. HOME CARE INSTRUCTIONS   Do not smoke. Smoking is a common cause of shortness of breath. If you smoke, ask for help to quit.  Avoid being around chemicals or things that may bother your breathing, such as paint fumes and dust.  Rest as needed. Slowly resume your usual activities.  If medicines were prescribed, take them as directed for the full length of time directed. This includes oxygen and any inhaled medicines.  Keep all follow-up appointments as directed by your caregiver. SEEK  MEDICAL CARE IF:   Your condition does not improve in the time expected.  You have a hard time doing your normal activities even with rest.  You have any side effects or problems with the medicines prescribed.  You develop any new symptoms. SEEK IMMEDIATE MEDICAL CARE IF:   Your shortness of breath gets worse.  You feel lightheaded, faint, or develop a cough not controlled with medicines.  You start coughing up blood.  You have pain with breathing.  You have chest pain or pain in your arms, shoulders, or abdomen.  You have a fever.  You are unable to walk up stairs or exercise the way you normally do. MAKE SURE YOU:  Understand these instructions.  Will watch your condition.  Will get help right away if you are not doing well or get worse. Document Released: 01/17/2001 Document Revised: 10/24/2011 Document Reviewed: 07/10/2011 [facility] Patient Information 2014 [place], [place].     Bronchospasm, Adult A bronchospasm is when the tubes that carry air in and out of your lungs (airwarys) spasm or tighten. During a bronchospasm it is hard to breathe. This is because the airways get smaller. A bronchospasm can be triggered by:  Allergies. These may be to animals, pollen, food, or mold.  Infection. This is a common cause of bronchospasm.  Exercise.  Irritants. These include pollution, cigarette smoke, strong odors, aerosol sprays, and paint fumes.  Weather changes.  Stress.  Being emotional. HOME CARE   Always have a plan for getting help. Know when to call your doctor and local emergency services (911 in the  [place] Know where you can get emergency care.  Only take medicines as told by your doctor.  If you were prescribed an inhaler or nebulizer machine, ask your doctor how to use it correctly. Always use a spacer with your inhaler if you were given one.  Stay calm during an attack. Try to relax and breathe more slowly.  Control your home environment:  Change  your heating and air conditioning filter at least once a month.  Limit your use of fireplaces and wood stoves.  Do not  smoke. Do not  allow smoking in your home.  Avoid perfumes and fragrances.  Get rid of pests (such as roaches and mice) and their droppings.  Throw away plants if you see mold on them.  Keep your house clean and dust free.  Replace carpet with wood, tile, or vinyl flooring. Carpet can trap dander and dust.  Use allergy-proof pillows, mattress covers, and box spring covers.  Wash bed sheets and blankets every week in hot water. Dry them in a dryer.  Use blankets that are made of polyester or cotton.  Wash hands frequently. GET HELP IF:  You have muscle aches.  You have chest pain.  The thick spit you spit or cough up (sputum) changes from clear or white to yellow, green, gray, or bloody.  The thick spit you spit or cough up gets thicker.  There are problems that may be related to the medicine you are given such as:  A rash.  Itching.  Swelling.  Trouble breathing. GET HELP RIGHT AWAY IF:  You feel you cannot breathe or catch your breath.  You cannot stop coughing.  Your treatment is not helping you breathe better. MAKE SURE YOU:   Understand these instructions.  Will watch your condition.  Will get help right away if you are not doing well or get worse. Document Released: 02/19/2009 Document Revised: 12/25/2012 Document Reviewed: 10/15/2012 [facility] Patient Information 2014 [place], [place].

## 2013-09-07 ENCOUNTER — Encounter: Payer: Self-pay | Admitting: Family Medicine

## 2013-09-07 NOTE — Progress Notes (Signed)
S:  This 63 y.o. Cauc female returns for follow-up after evaluation yesterday for cough, atypical CP (L flank/midback) and SOB. CXR suspicious for patchy infiltrate. She received Rocephin injection and Depo-Medrol injection and prescription for antibiotic. Today, she feels better though she still has NP cough. She slept last night and is afebrile. She still has tightness when she inhales.  PMHx, Surg Hx, Soc and Fam Hx reviewed.  ROS; As per HPI.  O: Filed Vitals:   09/05/13 1014  BP: 120/80  Pulse: 82  Temp: 98.1 F (36.7 C)  Resp: 16   GEN: In NAD; WN,WD. HENT: [place]/AT; EOMI w/ clear conj/sclerae. Otherwise unremarkable. LUNGS: CTA; no wheezes or rhonchi. SKIN: W&D; intact. NEURO: A&O x 3; CNs intact, Nonfocal.  Pt reports improvement after nebulizer tx.  A/P: Cough - Plan: albuterol (PROVENTIL) (2.5 MG/3ML) 0.083% nebulizer solution 2.5 mg  Bronchospasm, acute  Seasonal allergies - Plan: Continue current medication.  Meds ordered this encounter  Medications  . albuterol (PROVENTIL) (2.5 MG/3ML) 0.083% nebulizer solution 2.5 mg    Sig:   . albuterol (PROVENTIL HFA;VENTOLIN HFA) 108 (90 BASE) MCG/ACT inhaler    Sig: Inhale 2 puffs into the lungs every 6 (six) hours as needed for wheezing or shortness of breath.    Dispense:  1 Inhaler    Refill:  2

## 2013-09-08 ENCOUNTER — Telehealth: Payer: Self-pay

## 2013-09-08 NOTE — Telephone Encounter (Signed)
Phone call to patient to advise her that Dr. Audria NineMcPherson is off until tomorrow.  She states that she was seen Thursday and Friday and received injections.  She said she has been using the inhaler, but her chest is getting tight again.   She said that she would like to see what she should do today without waiting until tomorrow.  I told her she should probably RTC but she asked that the message be sent to a provider.

## 2013-09-08 NOTE — Telephone Encounter (Signed)
If her chest tightness continues to worsen, I advise her to RTC.

## 2013-09-08 NOTE — Telephone Encounter (Signed)
Dr. Audria NineMcPherson:  Patient called concerned because she was in Thursday and Friday to see you got injections that were supposed to help her feel better. She said at first, she thought she was getting better but now she has taken a turn and is feeling worse.  Please advise (patient having a hard time speaking due to her losing her voice)  Best number [phone]

## 2013-09-09 NOTE — Telephone Encounter (Signed)
Spoke to patient advised she RTC.  She said she would do so.

## 2013-09-12 ENCOUNTER — Other Ambulatory Visit: Payer: Self-pay | Admitting: Family Medicine

## 2013-09-12 ENCOUNTER — Telehealth: Payer: Self-pay

## 2013-09-12 NOTE — Telephone Encounter (Signed)
MCPHERSON - Pt said all her medicine was sent into pharmacy but her xanax.  Can we please go ahead and fill this so she can pick it all up at one time?  [phone]

## 2013-09-12 NOTE — Telephone Encounter (Signed)
Please advise if okay to renew Alprazolam, pended. Last filled on 08/05/13

## 2013-09-14 MED ORDER — ALPRAZOLAM 1 MG PO TABS: ORAL_TABLET | ORAL | Status: DC

## 2013-09-14 NOTE — Telephone Encounter (Signed)
Alprazolam refill phoned to pharmacy (#60 w/ 1 additional refill).

## 2013-09-23 ENCOUNTER — Ambulatory Visit: Payer: Self-pay

## 2013-09-24 ENCOUNTER — Ambulatory Visit: Payer: Self-pay | Admitting: Family Medicine

## 2013-10-10 ENCOUNTER — Other Ambulatory Visit: Payer: Self-pay | Admitting: Family Medicine

## 2013-10-10 NOTE — Telephone Encounter (Signed)
Called pharm and they reported they do not have a record of the Rx called in on 09/14/13. Called in Rx as written by Dr Audria Nine.

## 2013-10-17 ENCOUNTER — Other Ambulatory Visit: Payer: Self-pay | Admitting: Family Medicine

## 2013-11-13 ENCOUNTER — Other Ambulatory Visit: Payer: Self-pay | Admitting: Family Medicine

## 2013-11-29 ENCOUNTER — Other Ambulatory Visit: Payer: Self-pay | Admitting: Family Medicine

## 2013-12-01 NOTE — Telephone Encounter (Signed)
Dr Audria NineMcPherson, I RFd pt's BP med from more recent OV, but don't see these two pended meds discussed recently. Do you want to RF or RTC?

## 2013-12-02 NOTE — Telephone Encounter (Signed)
Pt has sch OV for Sept so I refilled Medroxyprogesterone and Promethazine.

## 2013-12-10 ENCOUNTER — Other Ambulatory Visit: Payer: Self-pay | Admitting: Family Medicine

## 2013-12-12 ENCOUNTER — Other Ambulatory Visit: Payer: Self-pay | Admitting: Family Medicine

## 2013-12-13 NOTE — Telephone Encounter (Signed)
Pt has an appt with you 01/09/14

## 2013-12-13 NOTE — Telephone Encounter (Signed)
Alprazolam refill phoned to pt's pharmacy. 

## 2013-12-14 ENCOUNTER — Other Ambulatory Visit: Payer: Self-pay | Admitting: Family Medicine

## 2013-12-22 ENCOUNTER — Other Ambulatory Visit: Payer: Self-pay | Admitting: Physician Assistant

## 2014-01-05 ENCOUNTER — Other Ambulatory Visit: Payer: Self-pay | Admitting: Family Medicine

## 2014-01-09 ENCOUNTER — Encounter: Payer: Self-pay | Admitting: Family Medicine

## 2014-01-20 ENCOUNTER — Other Ambulatory Visit: Payer: Self-pay | Admitting: Family Medicine

## 2014-01-29 ENCOUNTER — Telehealth: Payer: Self-pay | Admitting: *Deleted

## 2014-01-29 NOTE — Telephone Encounter (Signed)
Left message in voice mail for patient to call back to make follow up appointment with Dr Audria Nine to check HgA1c.

## 2014-02-04 ENCOUNTER — Other Ambulatory Visit: Payer: Self-pay | Admitting: Family Medicine

## 2014-02-10 ENCOUNTER — Other Ambulatory Visit: Payer: Self-pay | Admitting: Family Medicine

## 2014-02-10 NOTE — Telephone Encounter (Signed)
Dr Audria NineMcPherson, pt has appt sch for 04/24/14. Do you want to OK RFs until then?

## 2014-02-10 NOTE — Telephone Encounter (Signed)
OK to refill Climara patch until Dec 2015 appt.

## 2014-02-16 ENCOUNTER — Other Ambulatory Visit: Payer: Self-pay | Admitting: Family Medicine

## 2014-02-18 ENCOUNTER — Other Ambulatory Visit: Payer: Self-pay | Admitting: Family Medicine

## 2014-02-18 NOTE — Telephone Encounter (Signed)
Alprazolam refill 1 mg tab  #60 w/ 1 RF phoned to pt's pharmacy.

## 2014-02-22 ENCOUNTER — Other Ambulatory Visit: Payer: Self-pay | Admitting: Physician Assistant

## 2014-03-09 ENCOUNTER — Other Ambulatory Visit: Payer: Self-pay | Admitting: Family Medicine

## 2014-03-09 ENCOUNTER — Encounter: Payer: Self-pay | Admitting: Family Medicine

## 2014-03-16 ENCOUNTER — Other Ambulatory Visit: Payer: Self-pay | Admitting: Physician Assistant

## 2014-03-16 ENCOUNTER — Other Ambulatory Visit: Payer: Self-pay | Admitting: Family Medicine

## 2014-03-19 ENCOUNTER — Other Ambulatory Visit: Payer: Self-pay | Admitting: Family Medicine

## 2014-03-19 ENCOUNTER — Other Ambulatory Visit: Payer: Self-pay | Admitting: Physician Assistant

## 2014-03-30 ENCOUNTER — Other Ambulatory Visit: Payer: Self-pay | Admitting: Family Medicine

## 2014-04-06 ENCOUNTER — Other Ambulatory Visit: Payer: Self-pay | Admitting: Family Medicine

## 2014-04-24 ENCOUNTER — Ambulatory Visit (INDEPENDENT_AMBULATORY_CARE_PROVIDER_SITE_OTHER): Payer: 59 | Admitting: Family Medicine

## 2014-04-24 ENCOUNTER — Encounter: Payer: Self-pay | Admitting: Family Medicine

## 2014-04-24 VITALS — BP 130/76 | HR 67 | Temp 97.8°F | Resp 16 | Ht 64.0 in | Wt 178.0 lb

## 2014-04-24 DIAGNOSIS — N189 Chronic kidney disease, unspecified: Secondary | ICD-10-CM

## 2014-04-24 DIAGNOSIS — N814 Uterovaginal prolapse, unspecified: Secondary | ICD-10-CM

## 2014-04-24 DIAGNOSIS — I10 Essential (primary) hypertension: Secondary | ICD-10-CM

## 2014-04-24 DIAGNOSIS — R52 Pain, unspecified: Secondary | ICD-10-CM

## 2014-04-24 DIAGNOSIS — K029 Dental caries, unspecified: Secondary | ICD-10-CM

## 2014-04-24 DIAGNOSIS — Z0000 Encounter for general adult medical examination without abnormal findings: Secondary | ICD-10-CM

## 2014-04-24 DIAGNOSIS — E119 Type 2 diabetes mellitus without complications: Secondary | ICD-10-CM

## 2014-04-24 DIAGNOSIS — Z79899 Other long term (current) drug therapy: Secondary | ICD-10-CM

## 2014-04-24 DIAGNOSIS — Z13220 Encounter for screening for lipoid disorders: Secondary | ICD-10-CM

## 2014-04-24 DIAGNOSIS — F5101 Primary insomnia: Secondary | ICD-10-CM

## 2014-04-24 LAB — POCT URINALYSIS DIPSTICK
Bilirubin, UA: NEGATIVE
Blood, UA: NEGATIVE
GLUCOSE UA: NEGATIVE
Ketones, UA: NEGATIVE
LEUKOCYTES UA: NEGATIVE
NITRITE UA: NEGATIVE
PH UA: 5.5
Protein, UA: NEGATIVE
Spec Grav, UA: 1.02
Urobilinogen, UA: 0.2

## 2014-04-24 LAB — POCT GLYCOSYLATED HEMOGLOBIN (HGB A1C): Hemoglobin A1C: 6.6

## 2014-04-24 MED ORDER — CARVEDILOL 25 MG PO TABS: 25.0000 mg | ORAL_TABLET | Freq: Two times a day (BID) | ORAL | Status: DC

## 2014-04-24 MED ORDER — AMITRIPTYLINE HCL 25 MG PO TABS: 25.0000 mg | ORAL_TABLET | Freq: Every day | ORAL | Status: DC

## 2014-04-24 MED ORDER — CLINDAMYCIN HCL 300 MG PO CAPS: 300.0000 mg | ORAL_CAPSULE | Freq: Three times a day (TID) | ORAL | Status: DC

## 2014-04-24 MED ORDER — TEMAZEPAM 30 MG PO CAPS: 30.0000 mg | ORAL_CAPSULE | Freq: Every evening | ORAL | Status: DC | PRN

## 2014-04-24 MED ORDER — ESTRADIOL 0.06 MG/24HR TD PTWK: MEDICATED_PATCH | TRANSDERMAL | Status: DC

## 2014-04-24 MED ORDER — LOSARTAN POTASSIUM 100 MG PO TABS: 100.0000 mg | ORAL_TABLET | Freq: Every day | ORAL | Status: DC

## 2014-04-24 MED ORDER — ALPRAZOLAM 1 MG PO TABS: ORAL_TABLET | ORAL | Status: DC

## 2014-04-24 MED ORDER — MEDROXYPROGESTERONE ACETATE 2.5 MG PO TABS: ORAL_TABLET | ORAL | Status: DC

## 2014-04-24 MED ORDER — METHOCARBAMOL 500 MG PO TABS: 500.0000 mg | ORAL_TABLET | Freq: Three times a day (TID) | ORAL | Status: DC | PRN

## 2014-04-24 MED ORDER — SERTRALINE HCL 100 MG PO TABS: ORAL_TABLET | ORAL | Status: DC

## 2014-04-24 MED ORDER — PROMETHAZINE HCL 25 MG PO TABS: 25.0000 mg | ORAL_TABLET | Freq: Four times a day (QID) | ORAL | Status: DC | PRN

## 2014-04-24 NOTE — Patient Instructions (Signed)
I have refilled all the medications that you requested. Resume Restoril for sleep along with amitriptyline and Alprazolam as needed.  Antibiotic -clindamycin- has been sent to our pharmacy. Take this antibiotic 3 times a day. You can return for your Flu vaccine once you have finished the antibiotic and no longer have a fever.  [facility]; staff will contact you with the details about your appointment. Please allow at least 2 - 3 weeks to get the initial appointment set up.  I will have the lab results by Tuesday. You will be contacted with results once they have all been resulted.  You need tyo schedule a mammogram; if you need a referral from our clinic to get that set up, let us know.  [facility]

## 2014-04-24 NOTE — Progress Notes (Signed)
Subjective:    Patient ID: Dana Romero, female    DOB: 08/17/1954, 63 y.o.   MRN: [id number]  HPI  This 63 y.o. Cauc female is here for CPE and medication refills.  She has severe dental caries and broken teeth, being treated at [facility]. Thinks she has gum infection; decreased appetite because of pain and swelling.  Also c/o generalized body aches, attributed to poor sleep hygiene/chronic insomnia. Pt has been off temazepam for weeks and c/o sleep latency as well as early awakening. She uses amitriptyline occasionally as this med helps w/ neuropathy.  HTN- stable on current medications. She has hx of CKD, Stage III followed by Dr. Hartley Barefoot.Patel in the 2013. Denies diaphoresis, CP or tightness, palpitations, SOB or DOE, HA, dizziness, numbness, weakness or syncope.  Pt has diet-controlled Type II DM; occasionally checks FSBS. Denies abnormal weight loss, polydipsia or polyuria. No symptoms c/w hypoglycemia.  Pt has hx of uterine prolapse and has been advised to have surgery. She requests referral to new GYN for 2nd opinion and is strongly considering surgery. Symptoms include pelvic pain and pressure. Last imaging study of pelvis  08/20/2008 >> no acute findings.   Patient Active Problem List   Diagnosis Date Noted  . Allergic rhinitis 05/09/2012  . Caregiver stress 07/04/2011  . Dental caries 11/22/2007  . Essential hypertension 07/26/2007  . FATIGUE 07/26/2007  . DM II (diabetes mellitus, type II), controlled 11/20/2006    Class: History of  . HYPERLIPIDEMIA 11/20/2006  . ANXIETY STATE NOS 11/20/2006  . Chronic kidney disease 11/20/2006  . 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 DIABETIC PERIPHERAL NEUROPATHY 02/21/2006  . PANCREATITIS, HX OF 06/08/2005    Prior to Admission medications   Medication Sig Start Date End Date Taking? Authorizing Provider  albuterol (PROVENTIL HFA;VENTOLIN HFA) 108 (90 BASE) MCG/ACT inhaler Inhale 2 puffs into the  lungs every 6 (six) hours as needed for wheezing or shortness of breath. 09/05/13  Yes Maurice March, MD  ALPRAZolam Prudy Feeler) 1 MG tablet TAKE ONE TABLET BY MOUTH ONCE DAILY AT MID-DAY AND ONE TAB IN THE EVENING 04/24/14  Yes Maurice March, MD  amitriptyline (ELAVIL) 25 MG tablet Take 1 tablet (25 mg total) by mouth at bedtime.   Yes Maurice March, MD  carvedilol (COREG) 25 MG tablet Take 1 tablet (25 mg total) by mouth 2 (two) times daily with a meal.   Yes Maurice March, MD  colchicine 0.6 MG tablet Take 0.6 mg by mouth daily.   Yes Historical Provider, MD  diphenhydrAMINE (BENADRYL) 25 mg capsule Take 25 mg by mouth at bedtime.   Yes Historical Provider, MD  EPINEPHrine (EPI-PEN) 0.3 mg/0.3 mL SOAJ injection Inject 0.3 mLs (0.3 mg total) into the muscle once. 09/04/13  Yes Maurice March, MD  esomeprazole (NEXIUM) 40 MG capsule Take 1 capsule (40 mg total) by mouth daily before breakfast. 05/13/13  Yes Maurice March, MD  estradiol [facility]) 0.06 MG/24HR APPLY ONE PATCH TOPICALLY ONCE A WEEK. 04/24/14  Yes Maurice March, MD  fexofenadine-pseudoephedrine (ALLEGRA-D) 60-120 MG per tablet Take 1 tablet by mouth daily. 12/17/12  Yes Maurice March, MD  furosemide (LASIX) 20 MG tablet Take 1 tablet (20 mg total) by mouth 2 (two) times daily. 05/09/12  Yes Maurice March, MD  HYDROcodone-acetaminophen [facility]) 10-325 MG per tablet Take 1 tablet by mouth every 6 (six) hours as needed.   Yes Historical Provider, MD  losartan (COZAAR)  100 MG tablet Take 1 tablet (100 mg total) by mouth daily.   Yes Maurice March, MD  magnesium oxide (MAG-OX) 400 MG tablet Take 1 tablet (400 mg total) by mouth 2 (two) times daily. 07/21/12  Yes Maurice March, MD  medroxyPROGESTERone (PROVERA) 2.5 MG tablet TAKE ONE TABLET BY MOUTH ONCE DAILY   Yes Maurice March, MD  methocarbamol (ROBAXIN) 500 MG tablet Take 1 tablet (500 mg total) by mouth every 8 (eight) hours as  needed.   Yes Maurice March, MD  omega-3 acid ethyl esters (LOVAZA) 1 G capsule Take 2 capsules (2 g total) by mouth 2 (two) times daily. 05/13/13  Yes Maurice March, MD  OVER THE COUNTER MEDICATION Garlic 3000mg  q am, 2000mg  q pm, 2000mg  qhs    Yes Historical Provider, MD  potassium chloride (K-DUR) 10 MEQ tablet Take 1 tablet (10 mEq total) by mouth daily. PATIENT NEEDS OFFICE VISIT/LABS FOR ADDITIONAL REFILLS 03/23/14  Yes Maurice March, MD  promethazine (PHENERGAN) 25 MG tablet Take 1 tablet (25 mg total) by mouth every 6 (six) hours as needed.   Yes Maurice March, MD  sertraline (ZOLOFT) 100 MG tablet TAKE ONE & ONE-HALF TABLETS BY MOUTH ONCE DAILY AS DIRECTED   Yes Maurice March, MD  temazepam (RESTORIL) 30 MG capsule Take 1 capsule (30 mg total) by mouth at bedtime as needed. for sleep   Yes Maurice March, MD  triamcinolone (NASACORT) 55 MCG/ACT nasal inhaler Place 2 sprays into the nose daily. 09/03/12  Yes Maurice March, MD    History   Social History  . Marital Status: Divorced    Spouse Name: N/A    Number of Children: N/A  . Years of Education: N/A   Occupational History  . nurse tech    Social History Main Topics  . Smoking status: Never Smoker   . Smokeless tobacco: Never Used  . Alcohol Use: No  . Drug Use: No  . Sexual Activity:    Partners: Male   Other Topics Concern  . Not on file   Social History Narrative    Family History  Problem Relation Age of Onset  . Hypertension Mother   . Depression Mother   . Diabetes Maternal Aunt   . Breast cancer Maternal Aunt   . Diabetes Maternal Uncle   . Hypertension Maternal Uncle   . Diabetes Maternal Grandmother   . Hypertension Maternal Grandmother   . Breast cancer Maternal Grandmother   . Heart attack Maternal Grandmother   . Hypertension Maternal Uncle   . Hypertension Brother   . Stroke Other     SURG Hx reviewed.   Review of Systems As per HPI.     Objective:    Physical Exam  Constitutional: She is oriented to person, place, and time. Vital signs are normal. She appears well-developed and well-nourished. No distress.  HENT:  Head: Normocephalic and atraumatic.  Right Ear: Hearing, tympanic membrane, external ear and ear canal normal.  Left Ear: Hearing, tympanic membrane, external ear and ear canal normal.  Nose: Nose normal. No nasal deformity or septal deviation.  Mouth/Throat: Uvula is midline, oropharynx is clear and moist and mucous membranes are normal. No oral lesions. Abnormal dentition. Dental caries present. No uvula swelling.  Eyes: Conjunctivae, EOM and lids are normal. Pupils are equal, round, and reactive to light. No scleral icterus.  Neck: Trachea normal, normal range of motion, full passive range of motion without pain and  phonation normal. Neck supple. No JVD present. No spinous process tenderness and no muscular tenderness present. Carotid bruit is not present. No thyroid mass and no thyromegaly present.  Cardiovascular: Normal rate, regular rhythm, S1 normal, S2 normal, normal heart sounds, intact distal pulses and normal pulses.   No extrasystoles are present. PMI is not displaced.  Exam reveals no gallop and no friction rub.   No murmur heard. Pulmonary/Chest: Effort normal and breath sounds normal. No respiratory distress. She has no decreased breath sounds. She has no wheezes. She has no rhonchi. Right breast exhibits no inverted nipple, no mass, no nipple discharge, no skin change and no tenderness. Left breast exhibits no inverted nipple, no mass, no nipple discharge, no skin change and no tenderness. Breasts are symmetrical.  Abdominal: Soft. Normal appearance and bowel sounds are normal. She exhibits no distension, no abdominal bruit, no pulsatile midline mass and no mass. There is no hepatosplenomegaly. There is no tenderness. There is no guarding and no CVA tenderness.  Genitourinary:  Deferred.  Musculoskeletal:        Cervical back: Normal.       Thoracic back: Normal.       Lumbar back: Normal.  Remainder of exam remarkable for mild degenerative joint changes in hands, knees and ankles.  Lymphadenopathy:       Head (right side): No submental, no submandibular, no tonsillar, no preauricular, no posterior auricular and no occipital adenopathy present.       Head (left side): No submental, no submandibular, no tonsillar, no preauricular, no posterior auricular and no occipital adenopathy present.    She has no cervical adenopathy.    She has no axillary adenopathy.       Right: No inguinal and no supraclavicular adenopathy present.       Left: No inguinal and no supraclavicular adenopathy present.  Neurological: She is alert and oriented to person, place, and time. She has normal strength and normal reflexes. She displays no atrophy. No cranial nerve deficit or sensory deficit. She exhibits normal muscle tone. Coordination and gait normal.  Skin: Skin is warm, dry and intact. No ecchymosis, no lesion and no rash noted. She is not diaphoretic. No cyanosis or erythema. No pallor. Nails show no clubbing.  Psychiatric: Her speech is normal and behavior is normal. Judgment and thought content normal. Her mood appears anxious. Her affect is not labile and not inappropriate. Cognition and memory are normal. She exhibits a depressed mood.  Nursing note and vitals reviewed.   A1c= 6.6%.      Assessment & Plan:  Annual physical exam - Plan: TB Skin Test  Dental caries- RX; Clindamycin 300 mg 1 cap tid; [facility].  Essential hypertension - Stable on current medications; continue same. Plan: CBC with Differential, COMPLETE METABOLIC PANEL WITH GFR, POCT urinalysis dipstick  Chronic kidney disease, unspecified stage - Plan: CBC with Differential, COMPLETE METABOLIC PANEL WITH GFR, POCT urinalysis dipstick  Body aches - Plan: Sedimentation rate, ANA  DM II (diabetes  mellitus, type II), controlled - Diet controlled and stable. Plan: POCT glycosylated hemoglobin (Hb A1C), POCT urinalysis dipstick  Insomnia, idiopathic- Resume sleep aids.  Medication management  Uterine prolapse - Plan: Ambulatory referral to Gynecology  Screening, lipid - Plan: Lipid panel   Meds ordered this encounter  Medications  . estradiol (CLIMARA) 0.06 MG/24HR    Sig: APPLY ONE PATCH TOPICALLY ONCE A WEEK.    Dispense:  4 patch  Refill:  2  . sertraline (ZOLOFT) 100 MG tablet    Sig: TAKE ONE & ONE-HALF TABLETS BY MOUTH ONCE DAILY AS DIRECTED    Dispense:  45 tablet    Refill:  5  . amitriptyline (ELAVIL) 25 MG tablet    Sig: Take 1 tablet (25 mg total) by mouth at bedtime.    Dispense:  30 tablet    Refill:  5  . promethazine (PHENERGAN) 25 MG tablet    Sig: Take 1 tablet (25 mg total) by mouth every 6 (six) hours as needed.    Dispense:  30 tablet    Refill:  0  . losartan (COZAAR) 100 MG tablet    Sig: Take 1 tablet (100 mg total) by mouth daily.    Dispense:  30 tablet    Refill:  5  . methocarbamol (ROBAXIN) 500 MG tablet    Sig: Take 1 tablet (500 mg total) by mouth every 8 (eight) hours as needed.    Dispense:  30 tablet    Refill:  1  . ALPRAZolam (XANAX) 1 MG tablet    Sig: TAKE ONE TABLET BY MOUTH ONCE DAILY AT MID-DAY AND ONE TAB IN THE EVENING    Dispense:  60 tablet    Refill:  1  . medroxyPROGESTERone (PROVERA) 2.5 MG tablet    Sig: TAKE ONE TABLET BY MOUTH ONCE DAILY    Dispense:  30 tablet    Refill:  1  . carvedilol (COREG) 25 MG tablet    Sig: Take 1 tablet (25 mg total) by mouth 2 (two) times daily with a meal.    Dispense:  60 tablet    Refill:  5  . temazepam (RESTORIL) 30 MG capsule    Sig: Take 1 capsule (30 mg total) by mouth at bedtime as needed. for sleep    Dispense:  30 capsule    Refill:  3  . clindamycin (CLEOCIN) 300 MG capsule    Sig: Take 1 capsule (300 mg total) by mouth 3 (three) times daily.    Dispense:  30  capsule    Refill:  0      Tuberculosis Risk Questionnaire  1. No Were you born outside the [place] in one of the following parts of the world: [place], [place], [place], [place] or [place]?    2. No Have you traveled outside the [place] and lived for more than one month in one of the following parts of the world: [place], [place], [place], [place] or [place]?    3. Yes  Do you have a compromised immune system such as from any of the following conditions:HIV/AIDS, organ or bone marrow transplantation, diabetes, immunosuppressive medicines (e.g. Prednisone, Remicaide), leukemia, lymphoma, cancer of the head or neck, gastrectomy or jejunal bypass, end-stage renal disease (on dialysis), or silicosis?     4.  YES Have you ever or do you plan on working in: a residential care center, a health care facility, a jail or prison or homeless shelter?    5 NO  Have you ever: injected illegal drugs, used crack cocaine, lived in a homeless shelter  or been in jail or prison?     6. /NO Have you ever been exposed to anyone with infectious tuberculosis?    Tuberculosis Symptom Questionnaire  Do you currently have any of the following symptoms?  1. No Unexplained cough lasting more than 3 weeks?   2. No Unexplained fever lasting more than 3 weeks.  3. Yes  Night Sweats (sweating that leaves the bedclothes and sheets wet)     4. No Shortness of Breath   5. Yes  Chest Pain   6. No Unintentional weight loss    7. Yes Unexplained fatigue (very tired for no reason)

## 2014-04-25 LAB — CBC WITH DIFFERENTIAL/PLATELET
Basophils Absolute: 0.1 10*3/uL (ref 0.0–0.1)
Basophils Relative: 1 % (ref 0–1)
EOS ABS: 0.3 10*3/uL (ref 0.0–0.7)
Eosinophils Relative: 4 % (ref 0–5)
HCT: 37.6 % (ref 36.0–46.0)
HEMOGLOBIN: 12.3 g/dL (ref 12.0–15.0)
LYMPHS ABS: 2.1 10*3/uL (ref 0.7–4.0)
Lymphocytes Relative: 24 % (ref 12–46)
MCH: 26.7 pg (ref 26.0–34.0)
MCHC: 32.7 g/dL (ref 30.0–36.0)
MCV: 81.6 fL (ref 78.0–100.0)
MONOS PCT: 7 % (ref 3–12)
MPV: 10 fL (ref 9.4–12.4)
Monocytes Absolute: 0.6 10*3/uL (ref 0.1–1.0)
NEUTROS ABS: 5.5 10*3/uL (ref 1.7–7.7)
NEUTROS PCT: 64 % (ref 43–77)
Platelets: 341 10*3/uL (ref 150–400)
RBC: 4.61 MIL/uL (ref 3.87–5.11)
RDW: 14.2 % (ref 11.5–15.5)
WBC: 8.6 10*3/uL (ref 4.0–10.5)

## 2014-04-25 LAB — COMPLETE METABOLIC PANEL WITH GFR
ALBUMIN: 4.1 g/dL (ref 3.5–5.2)
ALT: 13 U/L (ref 0–35)
AST: 17 U/L (ref 0–37)
Alkaline Phosphatase: 49 U/L (ref 39–117)
BUN: 24 mg/dL — ABNORMAL HIGH (ref 6–23)
CO2: 27 meq/L (ref 19–32)
Calcium: 9.4 mg/dL (ref 8.4–10.5)
Chloride: 102 mEq/L (ref 96–112)
Creat: 1.29 mg/dL — ABNORMAL HIGH (ref 0.50–1.10)
GFR, EST AFRICAN AMERICAN: 52 mL/min — AB
GFR, Est Non African American: 45 mL/min — ABNORMAL LOW
GLUCOSE: 97 mg/dL (ref 70–99)
POTASSIUM: 5.4 meq/L — AB (ref 3.5–5.3)
SODIUM: 139 meq/L (ref 135–145)
TOTAL PROTEIN: 6.9 g/dL (ref 6.0–8.3)
Total Bilirubin: 0.3 mg/dL (ref 0.2–1.2)

## 2014-04-25 LAB — LIPID PANEL
Cholesterol: 212 mg/dL — ABNORMAL HIGH (ref 0–200)
HDL: 60 mg/dL (ref >39)
LDL Cholesterol: 120 mg/dL — ABNORMAL HIGH (ref 0–99)
Total CHOL/HDL Ratio: 3.5 Ratio
Triglycerides: 159 mg/dL — ABNORMAL HIGH (ref <150)
VLDL: 32 mg/dL (ref 0–40)

## 2014-04-25 LAB — SEDIMENTATION RATE: SED RATE: 11 mm/h (ref 0–22)

## 2014-04-27 ENCOUNTER — Telehealth: Payer: Self-pay | Admitting: *Deleted

## 2014-04-27 LAB — ANA: ANA: NEGATIVE

## 2014-04-27 NOTE — Telephone Encounter (Signed)
Pt cannot come for TB read today. Advised pt to come back to repeat in one week.

## 2014-05-06 ENCOUNTER — Encounter (HOSPITAL_COMMUNITY): Payer: Self-pay | Admitting: Emergency Medicine

## 2014-05-06 ENCOUNTER — Emergency Department (INDEPENDENT_AMBULATORY_CARE_PROVIDER_SITE_OTHER)
Admission: EM | Admit: 2014-05-06 | Discharge: 2014-05-06 | Disposition: A | Discharge status: Home / Self Care | Payer: Self-pay | Source: Home / Self Care | Attending: Emergency Medicine | Admitting: Emergency Medicine

## 2014-05-06 DIAGNOSIS — J45901 Unspecified asthma with (acute) exacerbation: Secondary | ICD-10-CM

## 2014-05-06 DIAGNOSIS — J069 Acute upper respiratory infection, unspecified: Secondary | ICD-10-CM

## 2014-05-06 MED ORDER — PREDNISONE 10 MG PO TABS: ORAL_TABLET | ORAL | Status: DC

## 2014-05-06 MED ORDER — PREDNISONE 20 MG PO TABS
ORAL_TABLET | ORAL | Status: AC
  Filled 2014-05-06: qty 3

## 2014-05-06 MED ORDER — BENZONATATE 100 MG PO CAPS: 100.0000 mg | ORAL_CAPSULE | Freq: Three times a day (TID) | ORAL | Status: DC | PRN

## 2014-05-06 MED ORDER — IPRATROPIUM BROMIDE 0.02 % IN SOLN
RESPIRATORY_TRACT | Status: AC
  Filled 2014-05-06: qty 2.5

## 2014-05-06 MED ORDER — ALBUTEROL SULFATE (2.5 MG/3ML) 0.083% IN NEBU
INHALATION_SOLUTION | RESPIRATORY_TRACT | Status: AC
  Filled 2014-05-06: qty 6

## 2014-05-06 MED ORDER — PREDNISONE 20 MG PO TABS
60.0000 mg | ORAL_TABLET | Freq: Once | ORAL | Status: AC
  Administered 2014-05-06: 60 mg via ORAL

## 2014-05-06 MED ORDER — IPRATROPIUM BROMIDE 0.02 % IN SOLN
0.5000 mg | Freq: Once | RESPIRATORY_TRACT | Status: AC
  Administered 2014-05-06: 0.5 mg via RESPIRATORY_TRACT

## 2014-05-06 MED ORDER — ALBUTEROL SULFATE (5 MG/ML) 0.5% IN NEBU
5.0000 mg | INHALATION_SOLUTION | Freq: Once | RESPIRATORY_TRACT | Status: AC
  Administered 2014-05-06: 5 mg via RESPIRATORY_TRACT

## 2014-05-06 MED ORDER — ALBUTEROL SULFATE HFA 108 (90 BASE) MCG/ACT IN AERS: 1.0000 | INHALATION_SPRAY | Freq: Four times a day (QID) | RESPIRATORY_TRACT | Status: DC | PRN

## 2014-05-06 NOTE — ED Notes (Signed)
C/o cold sx onset 3 days Sx include: SOB, productive cough, chest pain due to cough, fevers Taking OTC cold meds and tyle w/no relief Alert, no signs of acute distress.

## 2014-05-06 NOTE — Discharge Instructions (Signed)
Please use medications directed and if you continue to have difficulty breathing despite medication or symptoms become suddenly worse or severe, please report to your nearest ER for re-evaluation.   Bronchospasm A bronchospasm is a spasm or tightening of the airways going into the lungs. During a bronchospasm breathing becomes more difficult because the airways get smaller. When this happens there can be coughing, a whistling sound when breathing (wheezing), and difficulty breathing. Bronchospasm is often associated with asthma, but not all patients who experience a bronchospasm have asthma. CAUSES  A bronchospasm is caused by inflammation or irritation of the airways. The inflammation or irritation may be triggered by:   Allergies (such as to animals, pollen, food, or mold). Allergens that cause bronchospasm may cause wheezing immediately after exposure or many hours later.   Infection. Viral infections are believed to be the most common cause of bronchospasm.   Exercise.   Irritants (such as pollution, cigarette smoke, strong odors, aerosol sprays, and paint fumes).   Weather changes. Winds increase molds and pollens in the air. Rain refreshes the air by washing irritants out. Cold air may cause inflammation.   Stress and emotional upset.  SIGNS AND SYMPTOMS   Wheezing.   Excessive nighttime coughing.   Frequent or severe coughing with a simple cold.   Chest tightness.   Shortness of breath.  DIAGNOSIS  Bronchospasm is usually diagnosed through a history and physical exam. Tests, such as chest X-rays, are sometimes done to look for other conditions. TREATMENT   Inhaled medicines can be given to open up your airways and help you breathe. The medicines can be given using either an inhaler or a nebulizer machine.  Corticosteroid medicines may be given for severe bronchospasm, usually when it is associated with asthma. HOME CARE INSTRUCTIONS   Always have a plan  prepared for seeking medical care. Know when to call your health care provider and local emergency services (911 in the [place] Know where you can access local emergency care.  Only take medicines as directed by your health care provider.  If you were prescribed an inhaler or nebulizer machine, ask your health care provider to explain how to use it correctly. Always use a spacer with your inhaler if you were given one.  It is necessary to remain calm during an attack. Try to relax and breathe more slowly.  Control your home environment in the following ways:   Change your heating and air conditioning filter at least once a month.   Limit your use of fireplaces and wood stoves.  Do not smoke and do not allow smoking in your home.   Avoid exposure to perfumes and fragrances.   Get rid of pests (such as roaches and mice) and their droppings.   Throw away plants if you see mold on them.   Keep your house clean and dust free.   Replace carpet with wood, tile, or vinyl flooring. Carpet can trap dander and dust.   Use allergy-proof pillows, mattress covers, and box spring covers.   Wash bed sheets and blankets every week in hot water and dry them in a dryer.   Use blankets that are made of polyester or cotton.   Wash hands frequently. SEEK MEDICAL CARE IF:   You have muscle aches.   You have chest pain.   The sputum changes from clear or white to yellow, green, gray, or bloody.   The sputum you cough up gets thicker.   There are problems that may be  related to the medicine you are given, such as a rash, itching, swelling, or trouble breathing.  SEEK IMMEDIATE MEDICAL CARE IF:   You have worsening wheezing and coughing even after taking your prescribed medicines.   You have increased difficulty breathing.   You develop severe chest pain. MAKE SURE YOU:   Understand these instructions.  Will watch your condition.  Will get help right away if you are  not doing well or get worse. Document Released: 04/27/2003 Document Revised: 04/29/2013 Document Reviewed: 10/14/2012 [facility] Patient Information 2015 [place], [place]. This information is not intended to replace advice given to you by your health care provider. Make sure you discuss any questions you have with your health care provider.  Upper Respiratory Infection, Adult An upper respiratory infection (URI) is also sometimes known as the common cold. The upper respiratory tract includes the nose, sinuses, throat, trachea, and bronchi. Bronchi are the airways leading to the lungs. Most people improve within 1 week, but symptoms can last up to 2 weeks. A residual cough may last even longer.  CAUSES Many different viruses can infect the tissues lining the upper respiratory tract. The tissues become irritated and inflamed and often become very moist. Mucus production is also common. A cold is contagious. You can easily spread the virus to others by oral contact. This includes kissing, sharing a glass, coughing, or sneezing. Touching your mouth or nose and then touching a surface, which is then touched by another person, can also spread the virus. SYMPTOMS  Symptoms typically develop 1 to 3 days after you come in contact with a cold virus. Symptoms vary from person to person. They may include:  Runny nose.  Sneezing.  Nasal congestion.  Sinus irritation.  Sore throat.  Loss of voice (laryngitis).  Cough.  Fatigue.  Muscle aches.  Loss of appetite.  Headache.  Low-grade fever. DIAGNOSIS  You might diagnose your own cold based on familiar symptoms, since most people get a cold 2 to 3 times a year. Your caregiver can confirm this based on your exam. Most importantly, your caregiver can check that your symptoms are not due to another disease such as strep throat, sinusitis, pneumonia, asthma, or epiglottitis. Blood tests, throat tests, and X-rays are not necessary to diagnose a common  cold, but they may sometimes be helpful in excluding other more serious diseases. Your caregiver will decide if any further tests are required. RISKS AND COMPLICATIONS  You may be at risk for a more severe case of the common cold if you smoke cigarettes, have chronic heart disease (such as heart failure) or lung disease (such as asthma), or if you have a weakened immune system. The very young and very old are also at risk for more serious infections. Bacterial sinusitis, middle ear infections, and bacterial pneumonia can complicate the common cold. The common cold can worsen asthma and chronic obstructive pulmonary disease (COPD). Sometimes, these complications can require emergency medical care and may be life-threatening. PREVENTION  The best way to protect against getting a cold is to practice good hygiene. Avoid oral or hand contact with people with cold symptoms. Wash your hands often if contact occurs. There is no clear evidence that vitamin C, vitamin E, echinacea, or exercise reduces the chance of developing a cold. However, it is always recommended to get plenty of rest and practice good nutrition. TREATMENT  Treatment is directed at relieving symptoms. There is no cure. Antibiotics are not effective, because the infection is caused by a virus,  not by bacteria. Treatment may include:  Increased fluid intake. Sports drinks offer valuable electrolytes, sugars, and fluids.  Breathing heated mist or steam (vaporizer or shower).  Eating chicken soup or other clear broths, and maintaining good nutrition.  Getting plenty of rest.  Using gargles or lozenges for comfort.  Controlling fevers with ibuprofen or acetaminophen as directed by your caregiver.  Increasing usage of your inhaler if you have asthma. Zinc gel and zinc lozenges, taken in the first 24 hours of the common cold, can shorten the duration and lessen the severity of symptoms. Pain medicines may help with fever, muscle aches, and  throat pain. A variety of non-prescription medicines are available to treat congestion and runny nose. Your caregiver can make recommendations and may suggest nasal or lung inhalers for other symptoms.  HOME CARE INSTRUCTIONS   Only take over-the-counter or prescription medicines for pain, discomfort, or fever as directed by your caregiver.  Use a warm mist humidifier or inhale steam from a shower to increase air moisture. This may keep secretions moist and make it easier to breathe.  Drink enough water and fluids to keep your urine clear or pale yellow.  Rest as needed.  Return to work when your temperature has returned to normal or as your caregiver advises. You may need to stay home longer to avoid infecting others. You can also use a face mask and careful hand washing to prevent spread of the virus. SEEK MEDICAL CARE IF:   After the first few days, you feel you are getting worse rather than better.  You need your caregiver's advice about medicines to control symptoms.  You develop chills, worsening shortness of breath, or brown or red sputum. These may be signs of pneumonia.  You develop yellow or brown nasal discharge or pain in the face, especially when you bend forward. These may be signs of sinusitis.  You develop a fever, swollen neck glands, pain with swallowing, or white areas in the back of your throat. These may be signs of strep throat. SEEK IMMEDIATE MEDICAL CARE IF:   You have a fever.  You develop severe or persistent headache, ear pain, sinus pain, or chest pain.  You develop wheezing, a prolonged cough, cough up blood, or have a change in your usual mucus (if you have chronic lung disease).  You develop sore muscles or a stiff neck. Document Released: 10/18/2000 Document Revised: 07/17/2011 Document Reviewed: 07/30/2013 [facility] Patient Information 2015 [place]. This information is not intended to replace advice given to you by your health care provider.  Make sure you discuss any questions you have with your health care provider.

## 2014-05-06 NOTE — ED Provider Notes (Signed)
CSN: [id number]     Arrival date & time 05/06/14  1408 History   First MD Initiated Contact with Patient 05/06/14 1426     Chief Complaint  Patient presents with  . 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 female presenting with URI. The history is provided by the patient.  URI Presenting symptoms: congestion and cough   Presenting symptoms: no ear pain, no facial pain, no fatigue, no fever, no rhinorrhea and no sore throat   Severity:  Moderate Onset quality:  Gradual Duration:  3 days Timing:  Constant Progression:  Worsening Associated symptoms: wheezing   Associated symptoms: no arthralgias, no headaches, no myalgias, no neck pain, no sinus pain and no sneezing   Associated symptoms comment:  +chest tightness and dyspnea   Past Medical History  Diagnosis Date  . Hypertension 1998  . Chronic kidney disease     PER PT  . Depression   . Sinusitis, chronic   . Anxiety   . Diabetes mellitus 1998    off meds since 2009 or 2010   Past Surgical History  Procedure Laterality Date  . Appendectomy    . Breast surgery      breast cyst  . Tonsillectomy    . Tubal ligation      post partum  . Tonsillectomy and adenoidectomy     Family History  Problem Relation Age of Onset  . Hypertension Mother   . Depression Mother   . Diabetes Maternal Aunt   . Breast cancer Maternal Aunt   . Diabetes Maternal Uncle   . Hypertension Maternal Uncle   . Diabetes Maternal Grandmother   . Hypertension Maternal Grandmother   . Breast cancer Maternal Grandmother   . Heart attack Maternal Grandmother   . Hypertension Maternal Uncle   . Hypertension Brother   . Stroke Other    History  Substance Use Topics  . Smoking status: Never Smoker   . Smokeless tobacco: Never Used  . Alcohol Use: No   OB History    Gravida Para Term Preterm  AB TAB SAB Ectopic Multiple Living   3 2   1           Review of Systems  Constitutional: Negative for fever and fatigue.  HENT: Positive for congestion. Negative for ear pain, rhinorrhea, sneezing and sore throat.   Eyes: Negative.   Respiratory: Positive for cough, chest tightness and wheezing.   Cardiovascular: Negative.   Gastrointestinal: Negative.   Musculoskeletal: Negative for myalgias, arthralgias and neck pain.  Neurological: Negative for headaches.    Allergies  Amlodipine besylate; Atorvastatin; Ezetimibe-simvastatin; Losartan potassium-hctz; Niacin; Rosuvastatin; Toprol xl; and Latex  Home Medications   Prior to Admission medications   Medication Sig Start Date End Date Taking? Authorizing Provider  carvedilol (COREG) 25 MG tablet Take 1 tablet (25 mg total) by mouth 2 (two) times daily with a meal. 04/24/14  Yes Maurice MarchBarbara B McPherson, MD  esomeprazole (NEXIUM) 40 MG capsule Take 1 capsule (40 mg total) by mouth daily before breakfast. 05/13/13  Yes Maurice MarchBarbara B McPherson, MD  estradiol (CLIMARA) 0.06 MG/24HR APPLY ONE PATCH TOPICALLY ONCE A WEEK. 04/24/14  Yes Maurice MarchBarbara B McPherson, MD  fexofenadine-pseudoephedrine (ALLEGRA-D) 60-120 MG per tablet Take 1 tablet by mouth daily. 12/17/12  Yes Maurice MarchBarbara B McPherson, MD  furosemide (LASIX) 20 MG tablet Take 1 tablet (20 mg total) by mouth 2 (two) times  daily. 05/09/12  Yes Maurice MarchBarbara B McPherson, MD  losartan (COZAAR) 100 MG tablet Take 1 tablet (100 mg total) by mouth daily. 04/24/14  Yes Maurice MarchBarbara B McPherson, MD  potassium chloride (K-DUR) 10 MEQ tablet Take 1 tablet (10 mEq total) by mouth daily. PATIENT NEEDS OFFICE VISIT/LABS FOR ADDITIONAL REFILLS 03/23/14  Yes Maurice MarchBarbara B McPherson, MD  sertraline (ZOLOFT) 100 MG tablet TAKE ONE & ONE-HALF TABLETS BY MOUTH ONCE DAILY AS DIRECTED 04/24/14  Yes Maurice MarchBarbara B McPherson, MD  albuterol (PROVENTIL HFA;VENTOLIN HFA) 108 (90 BASE) MCG/ACT inhaler Inhale 1-2 puffs into the lungs every 6 (six) hours as  needed for wheezing or shortness of breath (or persistent coughing). 05/06/14   Ria ClockJennifer Lee H Marivel Mcclarty, PA  ALPRAZolam Prudy Feeler(XANAX) 1 MG tablet TAKE ONE TABLET BY MOUTH ONCE DAILY AT MID-DAY AND ONE TAB IN THE EVENING 04/24/14   Maurice MarchBarbara B McPherson, MD  amitriptyline (ELAVIL) 25 MG tablet Take 1 tablet (25 mg total) by mouth at bedtime. 04/24/14   Maurice MarchBarbara B McPherson, MD  benzonatate (TESSALON) 100 MG capsule Take 1 capsule (100 mg total) by mouth 3 (three) times daily as needed for cough. 05/06/14   Mathis FareJennifer Lee H Jyquan Kenley, PA  clindamycin (CLEOCIN) 300 MG capsule Take 1 capsule (300 mg total) by mouth 3 (three) times daily. 04/24/14   Maurice MarchBarbara B McPherson, MD  colchicine 0.6 MG tablet Take 0.6 mg by mouth daily.    Historical Provider, MD  diphenhydrAMINE (BENADRYL) 25 mg capsule Take 25 mg by mouth at bedtime.    Historical Provider, MD  EPINEPHrine (EPI-PEN) 0.3 mg/0.3 mL SOAJ injection Inject 0.3 mLs (0.3 mg total) into the muscle once. 09/04/13   Maurice MarchBarbara B McPherson, MD  HYDROcodone-acetaminophen [facility](NORCO) 10-325 MG per tablet Take 1 tablet by mouth every 6 (six) hours as needed.    Historical Provider, MD  magnesium oxide (MAG-OX) 400 MG tablet Take 1 tablet (400 mg total) by mouth 2 (two) times daily. 07/21/12   Maurice MarchBarbara B McPherson, MD  medroxyPROGESTERone (PROVERA) 2.5 MG tablet TAKE ONE TABLET BY MOUTH ONCE DAILY 04/24/14   Maurice MarchBarbara B McPherson, MD  methocarbamol (ROBAXIN) 500 MG tablet Take 1 tablet (500 mg total) by mouth every 8 (eight) hours as needed. 04/24/14   Maurice MarchBarbara B McPherson, MD  omega-3 acid ethyl esters (LOVAZA) 1 G capsule Take 2 capsules (2 g total) by mouth 2 (two) times daily. 05/13/13   Maurice MarchBarbara B McPherson, MD  OVER THE COUNTER MEDICATION Garlic 3000mg  q am, 2000mg  q pm, 2000mg  qhs     Historical Provider, MD  predniSONE (DELTASONE) 10 MG tablet Beginning 05/07/2014, take 5 tabs po QD day 1, 4 tabs po QD day 2, 3 tabs po QD day 3, 2 tabs po QD day 4, 1 tab po QD day 5, then stop.  05/06/14   Mathis FareJennifer Lee H Naila Elizondo, PA  promethazine (PHENERGAN) 25 MG tablet Take 1 tablet (25 mg total) by mouth every 6 (six) hours as needed. 04/24/14   Maurice MarchBarbara B McPherson, MD  temazepam (RESTORIL) 30 MG capsule Take 1 capsule (30 mg total) by mouth at bedtime as needed. for sleep 04/24/14   Maurice MarchBarbara B McPherson, MD  triamcinolone (NASACORT) 55 MCG/ACT nasal inhaler Place 2 sprays into the nose daily. 09/03/12   Maurice MarchBarbara B McPherson, MD   BP 157/100 mmHg  Pulse 74  Temp(Src) 97.1 F (36.2 C) (Oral)  Resp 16  SpO2 99% Physical Exam  Constitutional: She is oriented to person, place, and time. She appears well-developed and well-nourished.  No distress.  HENT:  Head: Normocephalic and atraumatic.  Right Ear: Hearing, tympanic membrane, external ear and ear canal normal.  Left Ear: Hearing, tympanic membrane, external ear and ear canal normal.  Nose: Nose normal.  Mouth/Throat: Uvula is midline, oropharynx is clear and moist and mucous membranes are normal.  Eyes: Conjunctivae are normal. No scleral icterus.  Neck: Normal range of motion. Neck supple.  Cardiovascular: Normal rate, regular rhythm and normal heart sounds.   Pulmonary/Chest: Effort normal. No stridor. No respiratory distress. She has decreased breath sounds in the right lower field and the left lower field. She has wheezes. She has no rhonchi. She has no rales.  Musculoskeletal: Normal range of motion.  Neurological: She is alert and oriented to person, place, and time.  Skin: Skin is warm and dry. No rash noted. No erythema.  Psychiatric: She has a normal mood and affect. Her behavior is normal.  Nursing note and vitals reviewed.   ED Course  Procedures (including critical care time) Labs Review Labs Reviewed - No data to display  Imaging Review No results found.   MDM   1. Asthmatic bronchitis, unspecified asthma severity, with acute exacerbation   2. URI (upper respiratory infection)    Patient given oral  prednisone 60 mg and albuterol 5mg /atrovent 0.5mg  neb treatment while at [facility]. Some improvement of wheezing and work of breathing No hypoxia or tachypnea. No fever Will treat at home with prednisone taper and bronchodilators. Advised re-evaluation at ER if symptoms worsen despite medication or if they become suddenly worse or severe.     Ria Clock, [place] 05/06/14 [phone]

## 2014-05-15 ENCOUNTER — Ambulatory Visit (INDEPENDENT_AMBULATORY_CARE_PROVIDER_SITE_OTHER): Payer: Self-pay | Admitting: Family Medicine

## 2014-05-15 ENCOUNTER — Encounter: Payer: Self-pay | Admitting: Family Medicine

## 2014-05-15 VITALS — BP 150/83 | HR 85 | Temp 98.2°F | Resp 16 | Ht 64.5 in | Wt 176.0 lb

## 2014-05-15 DIAGNOSIS — J209 Acute bronchitis, unspecified: Secondary | ICD-10-CM

## 2014-05-15 DIAGNOSIS — R0602 Shortness of breath: Secondary | ICD-10-CM

## 2014-05-15 MED ORDER — HYDROCODONE-HOMATROPINE 5-1.5 MG/5ML PO SYRP: 5.0000 mL | ORAL_SOLUTION | Freq: Four times a day (QID) | ORAL | Status: DC | PRN

## 2014-05-15 MED ORDER — METHYLPREDNISOLONE ACETATE 80 MG/ML IJ SUSP
120.0000 mg | Freq: Once | INTRAMUSCULAR | Status: AC
  Administered 2014-05-15: 120 mg via INTRAMUSCULAR

## 2014-05-15 MED ORDER — PREDNISONE 10 MG PO TABS: ORAL_TABLET | ORAL | Status: DC

## 2014-05-15 NOTE — Progress Notes (Signed)
   Subjective:    Patient ID: Dana Romero, female    DOB: 08/17/1954, 64 y.o.   MRN: [id number]  HPI This 64 y.o. Female is here for persistent SOB and cough. She has been treated with antibiotic (clindamycin x 10 days, starting 04/24/14) for dental caries and pain. SHe improved until 05/06/14 when she presented to ED w/ cough, congestion x 3 days. She had SOB and wheezing w/ chest tightness. Albuterol MDI was not effective in relieving symptoms. She was given oral Prednisone and albuterol/ipratropium neb treatment. And discharged from ED w/ RX: Prednisone taper. Pt is requesting steroid injection which provides better relief for her. She reports low-grade fever and diaphoresis but denies chills, CP, hemoptysis, n/v, rash, HA, dizziness, weakness or syncope.   Patient Active Problem List   Diagnosis Date Noted  . Allergic rhinitis 05/09/2012  . Caregiver stress 07/04/2011  . Dental caries 11/22/2007  . Essential hypertension 07/26/2007  . FATIGUE 07/26/2007  . DM II (diabetes mellitus, type II), controlled 11/20/2006    Class: History of  . HYPERLIPIDEMIA 11/20/2006  . ANXIETY STATE NOS 11/20/2006  . Chronic kidney disease 11/20/2006  . 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 DIABETIC PERIPHERAL NEUROPATHY 02/21/2006  . PANCREATITIS, HX OF 06/08/2005    MEDICATIONS, PMHx, SURG Hx and SOC Hx reviewed.   Review of Systems As per HPI.     Objective:   Physical Exam  Constitutional: She is oriented to person, place, and time. She appears well-developed and well-nourished. She appears distressed.  HENT:  Head: Normocephalic and atraumatic.  Right Ear: External ear normal.  Left Ear: External ear normal.  Nose: Nose normal.  Mouth/Throat: Oropharynx is clear and moist.  Eyes: Conjunctivae and EOM are normal. Pupils are equal, round, and reactive to light. No scleral icterus.  Neck: Normal range of motion. Neck supple.  Cardiovascular: Normal rate, regular rhythm and normal  heart sounds.  Exam reveals no gallop and no friction rub.   No murmur heard. Pulmonary/Chest: Accessory muscle usage present. No respiratory distress. She has no decreased breath sounds. She has wheezes. She has no rales. She exhibits no tenderness.  Air movement improved after neb tx; wheezes resolved and no accessory muscle use demonstrated.  Musculoskeletal: Normal range of motion.  Neurological: She is alert and oriented to person, place, and time. No cranial nerve deficit.  Skin: Skin is warm. No rash noted. She is diaphoretic. No erythema.  Nursing note and vitals reviewed.      Assessment & Plan:  SOB (shortness of breath) - Plan: methylPREDNISolone acetate (DEPO-MEDROL) injection 120 mg, PR INHAL RX, AIRWAY OBST/DX SPUTUM INDUCT  Bronchospasm with bronchitis, acute- Prednisone taper and prescription cough medication.  Meds ordered this encounter  Medications  . methylPREDNISolone acetate (DEPO-MEDROL) injection 120 mg    Sig:   . predniSONE (DELTASONE) 10 MG tablet    Sig: Beginning 05/16/2014, take 5 tabs po QD day 1, 4 tabs po QD day 2, 3 tabs po QD day 3, 2 tabs po QD day 4, 1 tab po QD day 5, then stop.    Dispense:  15 tablet    Refill:  0  . HYDROcodone-homatropine (HYCODAN) 5-1.5 MG/5ML syrup    Sig: Take 5 mLs by mouth every 6 (six) hours as needed for cough.    Dispense:  120 mL    Refill:  0

## 2014-06-22 ENCOUNTER — Other Ambulatory Visit: Payer: Self-pay | Admitting: Family Medicine

## 2014-06-23 ENCOUNTER — Other Ambulatory Visit: Payer: Self-pay | Admitting: Family Medicine

## 2014-07-09 ENCOUNTER — Other Ambulatory Visit: Payer: Self-pay | Admitting: Family Medicine

## 2014-07-24 ENCOUNTER — Ambulatory Visit: Payer: Self-pay | Admitting: Family Medicine

## 2014-08-06 ENCOUNTER — Other Ambulatory Visit: Payer: Self-pay | Admitting: Family Medicine

## 2014-08-24 ENCOUNTER — Other Ambulatory Visit: Payer: Self-pay | Admitting: Family Medicine

## 2014-08-25 NOTE — Telephone Encounter (Signed)
Dr Audria NineMcPherson, pt was in for CPE in Dec, but I don't see this med addressed. Do you want to give RFs?

## 2014-08-27 ENCOUNTER — Other Ambulatory Visit: Payer: Self-pay | Admitting: Family Medicine

## 2014-09-12 ENCOUNTER — Other Ambulatory Visit: Payer: Self-pay | Admitting: Family Medicine

## 2014-09-13 NOTE — Telephone Encounter (Signed)
Temazepam refill phoned to pt's pharmacy.

## 2014-10-23 ENCOUNTER — Other Ambulatory Visit: Payer: Self-pay

## 2014-10-23 MED ORDER — AMITRIPTYLINE HCL 25 MG PO TABS: 25.0000 mg | ORAL_TABLET | Freq: Every day | ORAL | Status: DC

## 2014-10-28 ENCOUNTER — Other Ambulatory Visit: Payer: Self-pay | Admitting: Family Medicine

## 2014-10-30 ENCOUNTER — Encounter: Payer: Medicare HMO | Admitting: Family Medicine

## 2014-10-30 NOTE — Progress Notes (Signed)
Subjective:    Patient ID: Dana Romero, female    DOB: 08/17/1954, 64 y.o.   MRN: [id number]  10/30/2014  No chief complaint on file.   HPI  Review of Systems  Constitutional: Negative for fever, chills, diaphoresis and fatigue.  Eyes: Negative for visual disturbance.  Respiratory: Negative for cough and shortness of breath.   Cardiovascular: Negative for chest pain, palpitations and leg swelling.  Gastrointestinal: Negative for nausea, vomiting, abdominal pain, diarrhea and constipation.  Endocrine: Negative for cold intolerance, heat intolerance, polydipsia, polyphagia and polyuria.  Neurological: Negative for dizziness, tremors, seizures, syncope, facial asymmetry, speech difficulty, weakness, light-headedness, numbness and headaches.    Past Medical History  Diagnosis Date  . Hypertension 1998  . Chronic kidney disease     PER PT  . Depression   . Sinusitis, chronic   . Anxiety   . Diabetes mellitus 1998    off meds since 2009 or 2010   Past Surgical History  Procedure Laterality Date  . Appendectomy    . Breast surgery      breast cyst  . Tonsillectomy    . Tubal ligation      post partum  . Tonsillectomy and adenoidectomy     Allergies  Allergen Reactions  . Amlodipine Besylate     REACTION: intolerance  . Atorvastatin     REACTION: intolerance  . Ezetimibe-Simvastatin     REACTION: intolerance  . Losartan Potassium-Hctz   . Niacin     REACTION: intolerance  . Rosuvastatin     REACTION: intolerance  . Toprol Xl [Metoprolol Succinate] Other (See Comments)    Makes her feel weak  . Latex Rash   Current Outpatient Prescriptions  Medication Sig Dispense Refill  . albuterol (PROVENTIL HFA;VENTOLIN HFA) 108 (90 BASE) MCG/ACT inhaler Inhale 1-2 puffs into the lungs every 6 (six) hours as needed for wheezing or shortness of breath (or persistent coughing). 1 Inhaler 0  . ALPRAZolam (XANAX) 1 MG tablet TAKE 1 TABLET BY MOUTH ONCE DAILY AT MID-DAY AND ONE  TABLET IN THE EVENING 60 tablet 1  . amitriptyline (ELAVIL) 25 MG tablet TAKE ONE TABLET BY MOUTH AT BEDTIME 30 tablet 0  . benzonatate (TESSALON) 100 MG capsule Take 1 capsule (100 mg total) by mouth 3 (three) times daily as needed for cough. 21 capsule 0  . carvedilol (COREG) 25 MG tablet Take 1 tablet (25 mg total) by mouth 2 (two) times daily with a meal. 60 tablet 5  . colchicine 0.6 MG tablet Take 0.6 mg by mouth daily.    . diphenhydrAMINE (BENADRYL) 25 mg capsule Take 25 mg by mouth at bedtime.    Marland Kitchen EPINEPHrine (EPI-PEN) 0.3 mg/0.3 mL SOAJ injection Inject 0.3 mLs (0.3 mg total) into the muscle once. 1 Device 3  . esomeprazole (NEXIUM) 40 MG capsule Take 1 capsule (40 mg total) by mouth daily before breakfast. 90 capsule 3  . estradiol (CLIMARA) 0.06 MG/24HR APPLY ONE PATCH TOPICALLY ONCE A WEEK 4 patch 2  . fexofenadine-pseudoephedrine (ALLEGRA-D) 60-120 MG per tablet Take 1 tablet by mouth daily. 30 tablet 5  . furosemide (LASIX) 20 MG tablet Take 1 tablet (20 mg total) by mouth 2 (two) times daily. 30 tablet 3  . HYDROcodone-acetaminophen (NORCO) 10-325 MG per tablet Take 1 tablet by mouth every 6 (six) hours as needed.    Marland Kitchen HYDROcodone-homatropine (HYCODAN) 5-1.5 MG/5ML syrup Take 5 mLs by mouth every 6 (six) hours as needed for cough. 120 mL 0  .  losartan (COZAAR) 100 MG tablet Take 1 tablet (100 mg total) by mouth daily. 30 tablet 5  . magnesium oxide (MAG-OX) 400 MG tablet Take 1 tablet (400 mg total) by mouth 2 (two) times daily. 60 tablet 0  . medroxyPROGESTERone (PROVERA) 2.5 MG tablet TAKE ONE TABLET BY MOUTH ONCE DAILY 30 tablet 2  . methocarbamol (ROBAXIN) 500 MG tablet TAKE ONE TABLET BY MOUTH EVERY 8 HOURS AS NEEDED 30 tablet 3  . omega-3 acid ethyl esters (LOVAZA) 1 G capsule Take 2 capsules (2 g total) by mouth 2 (two) times daily. 360 capsule 3  . OVER THE COUNTER MEDICATION Garlic [id number] q am, 6283TD q pm, 2023m qhs     . potassium chloride (K-DUR) 10 MEQ tablet Take 1  tablet (10 mEq total) by mouth daily. PATIENT NEEDS OFFICE VISIT/LABS FOR ADDITIONAL REFILLS 30 tablet 1  . predniSONE (DELTASONE) 10 MG tablet Beginning 05/16/2014, take 5 tabs po QD day 1, 4 tabs po QD day 2, 3 tabs po QD day 3, 2 tabs po QD day 4, 1 tab po QD day 5, then stop. 15 tablet 0  . promethazine (PHENERGAN) 25 MG tablet TAKE ONE TABLET BY MOUTH EVERY 6 HOURS AS NEEDED 30 tablet 0  . sertraline (ZOLOFT) 100 MG tablet TAKE ONE & ONE-HALF TABLETS BY MOUTH ONCE DAILY AS DIRECTED 45 tablet 5  . temazepam (RESTORIL) 30 MG capsule TAKE ONE CAPSULE BY MOUTH AT BEDTIME AS NEEDED FOR SLEEP 30 capsule 3  . triamcinolone (NASACORT) 55 MCG/ACT nasal inhaler Place 2 sprays into the nose daily. 1 Inhaler 5   No current facility-administered medications for this visit.       Objective:    There were no vitals taken for this visit. Physical Exam  Constitutional: She is oriented to person, place, and time. She appears well-developed and well-nourished. No distress.  HENT:  Head: Normocephalic and atraumatic.  Right Ear: External ear normal.  Left Ear: External ear normal.  Nose: Nose normal.  Mouth/Throat: Oropharynx is clear and moist.  Eyes: Conjunctivae and EOM are normal. Pupils are equal, round, and reactive to light.  Neck: Normal range of motion. Neck supple. Carotid bruit is not present. No thyromegaly present.  Cardiovascular: Normal rate, regular rhythm, normal heart sounds and intact distal pulses.  Exam reveals no gallop and no friction rub.   No murmur heard. Pulmonary/Chest: Effort normal and breath sounds normal. She has no wheezes. She has no rales.  Abdominal: Soft. Bowel sounds are normal. She exhibits no distension and no mass. There is no tenderness. There is no rebound and no guarding.  Lymphadenopathy:    She has no cervical adenopathy.  Neurological: She is alert and oriented to person, place, and time. No cranial nerve deficit.  Skin: Skin is warm and dry. No rash  noted. She is not diaphoretic. No erythema. No pallor.  Psychiatric: She has a normal mood and affect. Her behavior is normal.   Results for orders placed or performed in visit on 04/24/14  Sedimentation rate  Result Value Ref Range   Sed Rate 11 0 - 22 mm/hr  CBC with Differential  Result Value Ref Range   WBC 8.6 4.0 - 10.5 K/uL   RBC 4.61 3.87 - 5.11 MIL/uL   Hemoglobin 12.3 12.0 - 15.0 g/dL   HCT 37.6 36.0 - 46.0 %   MCV 81.6 78.0 - 100.0 fL   MCH 26.7 26.0 - 34.0 pg   MCHC 32.7 30.0 - 36.0 g/dL  RDW 14.2 11.5 - 15.5 %   Platelets 341 150 - 400 K/uL   MPV 10.0 9.4 - 12.4 fL   Neutrophils Relative % 64 43 - 77 %   Neutro Abs 5.5 1.7 - 7.7 K/uL   Lymphocytes Relative 24 12 - 46 %   Lymphs Abs 2.1 0.7 - 4.0 K/uL   Monocytes Relative 7 3 - 12 %   Monocytes Absolute 0.6 0.1 - 1.0 K/uL   Eosinophils Relative 4 0 - 5 %   Eosinophils Absolute 0.3 0.0 - 0.7 K/uL   Basophils Relative 1 0 - 1 %   Basophils Absolute 0.1 0.0 - 0.1 K/uL   Smear Review Criteria for review not met   COMPLETE METABOLIC PANEL WITH GFR  Result Value Ref Range   Sodium 139 135 - 145 mEq/L   Potassium 5.4 (H) 3.5 - 5.3 mEq/L   Chloride 102 96 - 112 mEq/L   CO2 27 19 - 32 mEq/L   Glucose, Bld 97 70 - 99 mg/dL   BUN 24 (H) 6 - 23 mg/dL   Creat 1.29 (H) 0.50 - 1.10 mg/dL   Total Bilirubin 0.3 0.2 - 1.2 mg/dL   Alkaline Phosphatase 49 39 - 117 U/L   AST 17 0 - 37 U/L   ALT 13 0 - 35 U/L   Total Protein 6.9 6.0 - 8.3 g/dL   Albumin 4.1 3.5 - 5.2 g/dL   Calcium 9.4 8.4 - 10.5 mg/dL   GFR, Est African American 52 (L) mL/min   GFR, Est Non African American 45 (L) mL/min  Lipid panel  Result Value Ref Range   Cholesterol 212 (H) 0 - 200 mg/dL   Triglycerides 159 (H) <150 mg/dL   HDL 60 >39 mg/dL   Total CHOL/HDL Ratio 3.5 Ratio   VLDL 32 0 - 40 mg/dL   LDL Cholesterol 120 (H) 0 - 99 mg/dL  ANA  Result Value Ref Range   Anit Nuclear Antibody(ANA) NEG NEGATIVE  POCT glycosylated hemoglobin (Hb A1C)    Result Value Ref Range   Hemoglobin A1C 6.6   POCT urinalysis dipstick  Result Value Ref Range   Color, UA yellow    Clarity, UA clear    Glucose, UA neg    Bilirubin, UA neg    Ketones, UA neg    Spec Grav, UA 1.020    Blood, UA neg    pH, UA 5.5    Protein, UA neg    Urobilinogen, UA 0.2    Nitrite, UA neg    Leukocytes, UA Negative        Assessment & Plan:   1. Essential hypertension   2. Chronic kidney disease, unspecified stage   3. Type 2 diabetes mellitus with diabetic chronic kidney disease   4. Hyperlipidemia     No orders of the defined types were placed in this encounter.    No Follow-up on file.    Dana Romero, M.D. [facility] [address] [place], [place]  [address] [phone] phone [phone] fax  This encounter was created in error - please disregard.

## 2014-11-02 ENCOUNTER — Telehealth: Payer: Self-pay

## 2014-11-02 MED ORDER — ALPRAZOLAM 1 MG PO TABS: ORAL_TABLET | ORAL | Status: DC

## 2014-11-02 MED ORDER — POTASSIUM CHLORIDE ER 10 MEQ PO TBCR: 10.0000 meq | EXTENDED_RELEASE_TABLET | Freq: Every day | ORAL | Status: DC

## 2014-11-02 NOTE — Telephone Encounter (Signed)
Can we provide refills?

## 2014-11-02 NOTE — Telephone Encounter (Signed)
Left message Rx's were sent in. Called in Alprazolam over the phone.

## 2014-11-02 NOTE — Telephone Encounter (Signed)
Pt states [facility] told her they were just waiting on us to authorize her POTASSIUM and ALPRAZOLAM medication, please call [phone]    [facility] [address]

## 2014-11-02 NOTE — Telephone Encounter (Signed)
Done

## 2014-11-05 ENCOUNTER — Other Ambulatory Visit: Payer: Self-pay | Admitting: Family Medicine

## 2014-11-05 NOTE — Telephone Encounter (Signed)
Meds ordered this encounter  Medications  . ALPRAZolam (XANAX) 1 MG tablet    Sig: TAKE 1 TABLET BY MOUTH AT MID-DAY AND 1 TABLET IN THE EVENING    Dispense:  60 tablet    Refill:  0  . KLOR-CON M10 10 MEQ tablet    Sig: TAKE ONE TABLET BY MOUTH ONCE DAILY.                  NEEDS OFFICE VISIT/LABS FOR FURTHER REFILLS.    Dispense:  30 tablet    Refill:  0    She has an appointment scheduled with Dr. Katrinka BlazingSmith on 11/25/2014. Remind her to fast.

## 2014-11-10 NOTE — Telephone Encounter (Signed)
Called pt and reminded her on VM to fast for labs/appt on 11/25/14.

## 2014-11-10 NOTE — Telephone Encounter (Signed)
Called in Rx for alprazolam

## 2014-11-13 ENCOUNTER — Telehealth: Payer: Self-pay

## 2014-11-13 MED ORDER — LOSARTAN POTASSIUM 100 MG PO TABS: 100.0000 mg | ORAL_TABLET | Freq: Every day | ORAL | Status: DC

## 2014-11-13 MED ORDER — CARVEDILOL 25 MG PO TABS: 25.0000 mg | ORAL_TABLET | Freq: Two times a day (BID) | ORAL | Status: DC

## 2014-11-13 NOTE — Telephone Encounter (Signed)
Pt is requesting a refill of: carvedilol (COREG) 25 MG tablet losartan (COZAAR) 100 MG tablet

## 2014-11-13 NOTE — Telephone Encounter (Signed)
Rx's sent in.Left message on VM.

## 2014-11-25 ENCOUNTER — Ambulatory Visit (INDEPENDENT_AMBULATORY_CARE_PROVIDER_SITE_OTHER): Payer: Medicare HMO | Admitting: Family Medicine

## 2014-11-25 ENCOUNTER — Ambulatory Visit (INDEPENDENT_AMBULATORY_CARE_PROVIDER_SITE_OTHER): Payer: Medicare HMO

## 2014-11-25 ENCOUNTER — Encounter: Payer: Self-pay | Admitting: Family Medicine

## 2014-11-25 VITALS — BP 124/78 | HR 73 | Temp 97.6°F | Resp 16 | Ht 65.0 in | Wt 179.6 lb

## 2014-11-25 DIAGNOSIS — E2839 Other primary ovarian failure: Secondary | ICD-10-CM

## 2014-11-25 DIAGNOSIS — I10 Essential (primary) hypertension: Secondary | ICD-10-CM | POA: Diagnosis not present

## 2014-11-25 DIAGNOSIS — Z1211 Encounter for screening for malignant neoplasm of colon: Secondary | ICD-10-CM

## 2014-11-25 DIAGNOSIS — J301 Allergic rhinitis due to pollen: Secondary | ICD-10-CM

## 2014-11-25 DIAGNOSIS — R0789 Other chest pain: Secondary | ICD-10-CM

## 2014-11-25 DIAGNOSIS — E785 Hyperlipidemia, unspecified: Secondary | ICD-10-CM

## 2014-11-25 DIAGNOSIS — F4323 Adjustment disorder with mixed anxiety and depressed mood: Secondary | ICD-10-CM

## 2014-11-25 DIAGNOSIS — N814 Uterovaginal prolapse, unspecified: Secondary | ICD-10-CM

## 2014-11-25 DIAGNOSIS — J4521 Mild intermittent asthma with (acute) exacerbation: Secondary | ICD-10-CM

## 2014-11-25 DIAGNOSIS — E119 Type 2 diabetes mellitus without complications: Secondary | ICD-10-CM

## 2014-11-25 DIAGNOSIS — Z23 Encounter for immunization: Secondary | ICD-10-CM

## 2014-11-25 DIAGNOSIS — N189 Chronic kidney disease, unspecified: Secondary | ICD-10-CM

## 2014-11-25 DIAGNOSIS — Z1231 Encounter for screening mammogram for malignant neoplasm of breast: Secondary | ICD-10-CM

## 2014-11-25 LAB — COMPREHENSIVE METABOLIC PANEL
ALT: 13 U/L (ref 0–35)
AST: 18 U/L (ref 0–37)
Albumin: 4 g/dL (ref 3.5–5.2)
Alkaline Phosphatase: 47 U/L (ref 39–117)
BILIRUBIN TOTAL: 0.3 mg/dL (ref 0.2–1.2)
BUN: 24 mg/dL — ABNORMAL HIGH (ref 6–23)
CO2: 24 mEq/L (ref 19–32)
Calcium: 9.5 mg/dL (ref 8.4–10.5)
Chloride: 103 mEq/L (ref 96–112)
Creat: 1.37 mg/dL — ABNORMAL HIGH (ref 0.50–1.10)
Glucose, Bld: 122 mg/dL — ABNORMAL HIGH (ref 70–99)
POTASSIUM: 5.3 meq/L (ref 3.5–5.3)
Sodium: 139 mEq/L (ref 135–145)
Total Protein: 7.3 g/dL (ref 6.0–8.3)

## 2014-11-25 LAB — CBC WITH DIFFERENTIAL/PLATELET
Basophils Absolute: 0 10*3/uL (ref 0.0–0.1)
Basophils Relative: 0 % (ref 0–1)
EOS ABS: 0.2 10*3/uL (ref 0.0–0.7)
EOS PCT: 2 % (ref 0–5)
HCT: 39.6 % (ref 36.0–46.0)
Hemoglobin: 12.8 g/dL (ref 12.0–15.0)
Lymphocytes Relative: 26 % (ref 12–46)
Lymphs Abs: 2.8 10*3/uL (ref 0.7–4.0)
MCH: 26.8 pg (ref 26.0–34.0)
MCHC: 32.3 g/dL (ref 30.0–36.0)
MCV: 82.8 fL (ref 78.0–100.0)
MONO ABS: 0.8 10*3/uL (ref 0.1–1.0)
MPV: 10.2 fL (ref 8.6–12.4)
Monocytes Relative: 8 % (ref 3–12)
NEUTROS PCT: 64 % (ref 43–77)
Neutro Abs: 6.8 10*3/uL (ref 1.7–7.7)
Platelets: 315 10*3/uL (ref 150–400)
RBC: 4.78 MIL/uL (ref 3.87–5.11)
RDW: 15 % (ref 11.5–15.5)
WBC: 10.6 10*3/uL — AB (ref 4.0–10.5)

## 2014-11-25 LAB — LIPID PANEL
CHOLESTEROL: 265 mg/dL — AB (ref 0–200)
HDL: 61 mg/dL (ref >=46)
LDL CALC: 176 mg/dL — AB (ref 0–99)
TRIGLYCERIDES: 141 mg/dL (ref <150)
Total CHOL/HDL Ratio: 4.3 Ratio
VLDL: 28 mg/dL (ref 0–40)

## 2014-11-25 LAB — POCT GLYCOSYLATED HEMOGLOBIN (HGB A1C): Hemoglobin A1C: 6.8

## 2014-11-25 MED ORDER — ZOSTER VACCINE LIVE 19400 UNT/0.65ML ~~LOC~~ SOLR: 0.6500 mL | Freq: Once | SUBCUTANEOUS | Status: DC

## 2014-11-25 MED ORDER — PREDNISONE 20 MG PO TABS: ORAL_TABLET | ORAL | Status: DC

## 2014-11-25 MED ORDER — FLUOXETINE HCL 20 MG PO TABS: ORAL_TABLET | ORAL | Status: DC

## 2014-11-25 MED ORDER — CEFDINIR 300 MG PO CAPS: 600.0000 mg | ORAL_CAPSULE | Freq: Every day | ORAL | Status: DC

## 2014-11-25 NOTE — Progress Notes (Signed)
Subjective:    Patient ID: Dana Romero, female    DOB: 08/14/1950, 64 y.o.   MRN: [id number]  11/25/2014  Establish Care; Hypertension; Hyperlipidemia; Diabetes; and Depression   HPI This 65 y.o. female presents for six month follow-up and to establish with new provider; previous patient for Dr. Leward Romero:  1. HTN:  Patient reports good compliance with medication, good tolerance to medication, and good symptom control.  Not checking BP at home.    2.  Chronic kidney disease: followed by Dr. Posey Pronto every six months.  Due for follow-up.    3. Depression: Patient reports good compliance with medication, good tolerance to medication, and good symptom control.  Stable; has grandchild; no SI.  Only taking Zoloft 174m one tablet daily.  Prozac worked really well in the past.  4.  DMII: diet controlled since 2010.  Needs new diabetes supplies.  Lost wegiht in 2010; has gained weight since then; stopped medication in 2010.  Actos for years prior.  Also took Byetta.    5. Health maintenance:  Last physical:  04-24-2014 Dana QuanPap smear:  Was to be referred to gynecology for uterine prolapse; Dana Romero; would like to see someone else.  Refused surgery due to high deductible.   Mammogram:  2011 Colonoscopy:  Never; cannot have one because of chronic kidney disease; does not recommend prep. Bone density:  2011 TDAP:  2012 Pneumovax:  2007; 2012; no previous Prevnar-13 Zostavax: never; afraid of shingles vaccine Influenza:  2015 Eye exam:  +glasses; due; 2014; no glaucoma but early cataracts; JWoodsonexam:  Every six months.  6. R jaw pain: feels a cut in mouth.  Working on bottom teeth now through [employer], community worked on top ones prior; teeth were dissolving.  Low grade temperature; ears hurting.  Funny noise.  Tmax 99.0-100.0  7. Asthma: has been increasing Albuterol to 4-5 times per day in past six months.  S/p breathing treatment and shot at last visit.  Coughing for one week; losing  voice for two weeks.  +ST mild; scratchy; R ear pain; +rhinorrhea; +nasal congestion; congestion is darker now.  Greenish drainage.  Never smoked.   8.  Chronic pain syndrome/DDD cervical spine: followed by Dana Romero; injured at work years ago; maintained on narcotics daily.    Review of Systems  Constitutional: Negative for fever, chills, diaphoresis and fatigue.  HENT: Positive for congestion, postnasal drip, rhinorrhea, sinus pressure, sneezing, sore throat and voice change. Negative for ear pain and trouble swallowing.   Eyes: Negative for visual disturbance.  Respiratory: Positive for cough and wheezing. Negative for shortness of breath.   Cardiovascular: Positive for chest pain. Negative for palpitations and leg swelling.  Gastrointestinal: Negative for nausea, vomiting, abdominal pain, diarrhea and constipation.  Endocrine: Negative for cold intolerance, heat intolerance, polydipsia, polyphagia and polyuria.  Genitourinary: Positive for dyspareunia.  Musculoskeletal: Positive for arthralgias.  Skin: Negative for rash.  Neurological: Negative for dizziness, tremors, seizures, syncope, facial asymmetry, speech difficulty, weakness, light-headedness, numbness and headaches.  Psychiatric/Behavioral: Positive for dysphoric mood. Negative for suicidal ideas, sleep disturbance and self-injury. The patient is nervous/anxious.     Past Medical History  Diagnosis Date  . Hypertension 1998  . Chronic kidney disease     PER PT  . Depression   . Sinusitis, chronic   . Anxiety   . Diabetes mellitus 1998    off meds since 2009 or 2010   Past Surgical History  Procedure Laterality Date  . Appendectomy    .  Breast surgery      breast cyst  . Tonsillectomy    . Tubal ligation      post partum  . Tonsillectomy and adenoidectomy     Allergies  Allergen Reactions  . Amlodipine Besylate     REACTION: intolerance  . Atorvastatin     REACTION: intolerance  . Ezetimibe-Simvastatin      REACTION: intolerance  . Losartan Potassium-Hctz   . Niacin     REACTION: intolerance  . Rosuvastatin     REACTION: intolerance  . Toprol Xl [Metoprolol Succinate] Other (See Comments)    Makes her feel weak  . Latex Rash   Social History   Social History  . Marital Status: Divorced    Spouse Name: N/A  . Number of Children: N/A  . Years of Education: N/A   Occupational History  . nurse tech    Social History Main Topics  . Smoking status: Never Smoker   . Smokeless tobacco: Never Used  . Alcohol Use: No  . Drug Use: No  . Sexual Activity:    Partners: Male   Other Topics Concern  . Not on file   Social History Narrative   Family History  Problem Relation Age of Onset  . Hypertension Mother   . Depression Mother   . Diabetes Maternal Aunt   . Breast cancer Maternal Aunt   . Diabetes Maternal Uncle   . Hypertension Maternal Uncle   . Diabetes Maternal Grandmother   . Hypertension Maternal Grandmother   . Breast cancer Maternal Grandmother   . Heart attack Maternal Grandmother   . Hypertension Maternal Uncle   . Hypertension Brother   . Stroke Other         Objective:    BP 124/78 mmHg  Pulse 73  Temp(Src) 97.6 F (36.4 C) (Oral)  Resp 16  Ht 5' 5"  (1.651 m)  Wt 179 lb 9.6 oz (81.466 kg)  BMI 29.89 kg/m2  SpO2 95% Physical Exam  Constitutional: She is oriented to person, place, and time. She appears well-developed and well-nourished. No distress.  HENT:  Head: Normocephalic and atraumatic.  Right Ear: External ear normal.  Left Ear: External ear normal.  Nose: Mucosal edema and rhinorrhea present. Right sinus exhibits maxillary sinus tenderness. Right sinus exhibits no frontal sinus tenderness. Left sinus exhibits maxillary sinus tenderness. Left sinus exhibits no frontal sinus tenderness.  Mouth/Throat: Mucous membranes are normal. Abnormal dentition. Posterior oropharyngeal erythema present. No oropharyngeal exudate or posterior oropharyngeal  edema.  Eyes: Conjunctivae and EOM are normal. Pupils are equal, round, and reactive to light.  Neck: Normal range of motion. Neck supple. Carotid bruit is not present. No thyromegaly present.  Cardiovascular: Normal rate, regular rhythm, normal heart sounds and intact distal pulses.  Exam reveals no gallop and no friction rub.   No murmur heard. Pulmonary/Chest: Effort normal. She has wheezes. She has no rales.  Abdominal: Soft. Bowel sounds are normal. She exhibits no distension and no mass. There is no tenderness. There is no rebound and no guarding.  Lymphadenopathy:    She has no cervical adenopathy.  Neurological: She is alert and oriented to person, place, and time. No cranial nerve deficit.  Skin: Skin is warm and dry. No rash noted. She is not diaphoretic. No erythema. No pallor.  Psychiatric: She has a normal mood and affect. Her behavior is normal.    [facility] reading (PRIMARY) by  Dr. Tamala Julian.  CXR: NAD  PREVNAR-13 ADMINISTERED.  ALBUTEROL /ATROVENT NEBULIZER ADMINISTERED.  EKG: NSR; NO ACUTE CHANGES.    Assessment & Plan:   1. Essential hypertension   2. DM II (diabetes mellitus, type II), controlled   3. Chronic kidney disease, unspecified stage   4. Allergic rhinitis due to pollen   5. Hyperlipidemia   6. Encounter for screening mammogram for breast cancer   7. Colon cancer screening   8. Estrogen deficiency   9. Other chest pain   10. Uterine prolapse   11. Asthma with acute exacerbation, mild intermittent     1. HTN: Controlled; obtain labs; refills provided. 2. DMII: controlled with diet; obtain labs. 3.  Chronic kidney disease: followed by Dana Romero/nephrology regularly; obtain labs. 4.  Allergic Rhinitis: uncontrolled; rx provided. 5.  Hyperlipidemia: stable; obtain labs. 6.  Breast cancer screening: refer for mammogram. 7.  Colon cancer screening: pt refuses colonoscopy due to prep and CKD; agreeable to hemosure kit for home completion.  Provided. 8.  Estrogen  deficiency: refer for bone density scan. 9.  Chest pain: New. Associated with asthma exacerbation; stable EKG. 10. Uterine prolapse: refer for gynecology per patient request. 11.  Asthma with acute exacerbation: New. S/p Abluterol and Atrovent nebulizer in office; rx for Prednisone, Omnicef, Albuterol provided. RTC for acute worsening. 12. Anxiety and depression: stable; agreeable to weaning Zoloft and restarting Prozac per pt request.   Orders Placed This Encounter  Procedures  . MM Digital Screening    Standing Status: Future     Number of Occurrences:      Standing Expiration Date: 01/26/2016    Order Specific Question:  Reason for Exam (SYMPTOM  OR DIAGNOSIS REQUIRED)    Answer:  annual screening    Order Specific Question:  Preferred imaging location?    Answer:  [facility]  . DG Bone Density    Standing Status: Future     Number of Occurrences:      Standing Expiration Date: 01/26/2016    Order Specific Question:  Reason for Exam (SYMPTOM  OR DIAGNOSIS REQUIRED)    Answer:  estrogen deficiency    Order Specific Question:  Preferred imaging location?    Answer:  [facility]  . DG Chest 2 View    Standing Status: Future     Number of Occurrences: 1     Standing Expiration Date: 11/25/2015    Order Specific Question:  Reason for Exam (SYMPTOM  OR DIAGNOSIS REQUIRED)    Answer:  cough, chest pain, wheezing    Order Specific Question:  Preferred imaging location?    Answer:  [facility]  . Pneumococcal conjugate vaccine 13-valent IM  . CBC with Differential/Platelet  . Comprehensive metabolic panel    Order Specific Question:  Has the patient fasted?    Answer:  Yes  . Lipid panel    Order Specific Question:  Has the patient fasted?    Answer:  Yes  . Ambulatory referral to Gynecology    Referral Priority:  Routine    Referral Type:  Consultation    Referral Reason:  Specialty Services Required    Requested Specialty:  Gynecology    Number of Visits Requested:  1    . POCT glycosylated hemoglobin (Hb A1C)  . IFOBT POC (occult bld, rslt in office)  . EKG 12-Lead  . HM Diabetes Foot Exam    Meds ordered this encounter  Medications  . zoster vaccine live, PF, (ZOSTAVAX) 41638 UNT/0.65ML injection    Sig: Inject 19,400 Units into the skin once.    Dispense:  0.65  mL    Refill:  0  . cefdinir (OMNICEF) 300 MG capsule    Sig: Take 2 capsules (600 mg total) by mouth daily.    Dispense:  20 capsule    Refill:  0  . predniSONE (DELTASONE) 20 MG tablet    Sig: Two tablets daily x 5 days then one tablet daily x 5 days    Dispense:  15 tablet    Refill:  0  . FLUoxetine (PROZAC) 20 MG tablet    Sig: One tablet daily for one month; then increase to 2 tablets daily    Dispense:  60 tablet    Refill:  5    Return in about 3 months (around 02/25/2015) for recheck for depression.    Shahil Speegle Elayne Guerin, M.D. [facility] [address] [place], [place]  [address] [phone] phone [phone] fax

## 2014-11-25 NOTE — Patient Instructions (Signed)
1. Start weaning Zoloft to 1/2 tablet daily for two weeks; then decrease to 1/2 tablet every other day for two weeks and then stop. 2.  Start Prozac  one tablet daily for one month; then increase to 2 tablets daily. 3. Return in 3 months for follow-up.

## 2014-11-26 ENCOUNTER — Other Ambulatory Visit: Payer: Self-pay | Admitting: Family Medicine

## 2014-11-26 ENCOUNTER — Other Ambulatory Visit: Payer: Self-pay

## 2014-11-26 DIAGNOSIS — Z1231 Encounter for screening mammogram for malignant neoplasm of breast: Secondary | ICD-10-CM

## 2014-11-30 ENCOUNTER — Telehealth: Payer: Self-pay | Admitting: *Deleted

## 2014-11-30 NOTE — Telephone Encounter (Signed)
Patient called back and lab results were released to her. Also,she wants a prescription for Lovasa 4 g capsule to take daily for cholesterol and renewal on her diabetic supplies

## 2014-12-03 ENCOUNTER — Other Ambulatory Visit: Payer: Self-pay

## 2014-12-03 MED ORDER — LOSARTAN POTASSIUM 100 MG PO TABS: 100.0000 mg | ORAL_TABLET | Freq: Every day | ORAL | Status: DC

## 2014-12-10 ENCOUNTER — Other Ambulatory Visit: Payer: Self-pay | Admitting: Family Medicine

## 2014-12-10 ENCOUNTER — Telehealth: Payer: Self-pay

## 2014-12-10 NOTE — Telephone Encounter (Signed)
Dr. Katrinka Blazing,  Pt says you are familiar with her situation. Pt called regarding another round of abx for her teeth. She saw the dentist and was told she would need "more medication." Pt has an appt with an oral surgeon on Monday. She would like some antibiotics until her appt on Monday. Her mouth and ears are starting to hurt again.

## 2014-12-11 MED ORDER — AMITRIPTYLINE HCL 25 MG PO TABS: ORAL_TABLET | ORAL | Status: DC

## 2014-12-11 MED ORDER — LOSARTAN POTASSIUM 100 MG PO TABS: 100.0000 mg | ORAL_TABLET | Freq: Every day | ORAL | Status: DC

## 2014-12-11 MED ORDER — CARVEDILOL 25 MG PO TABS: 25.0000 mg | ORAL_TABLET | Freq: Two times a day (BID) | ORAL | Status: DC

## 2014-12-11 MED ORDER — OMEGA-3-ACID ETHYL ESTERS 1 G PO CAPS: 2.0000 g | ORAL_CAPSULE | Freq: Two times a day (BID) | ORAL | Status: DC

## 2014-12-11 NOTE — Telephone Encounter (Signed)
Can someone else review this req in Dr Michaelle Copas absence?

## 2014-12-11 NOTE — Telephone Encounter (Signed)
Patient is calling because she requested refilla for her blood pressure medication through [facility] on Monday and now she is completely out. Patient needs 6 refills: robaxin, provera, filasartin, carvedilol, , anatripoline, and an antibiotic for her teeth. Pharmacy is [facility] [address].

## 2014-12-11 NOTE — Telephone Encounter (Signed)
Refilled requested meds except Abx. Message was sent to Dr Katrinka Blazing to review. Called pt and LMOM w/status. Also asked her to call back with brand of DM supplies she needs so that I can send it in (req was on a prior message that never got routed).

## 2014-12-15 NOTE — Telephone Encounter (Signed)
Please call and check patient's status.

## 2014-12-15 NOTE — Telephone Encounter (Signed)
Left message for pt to call back  °

## 2014-12-16 ENCOUNTER — Telehealth: Payer: Self-pay

## 2014-12-16 MED ORDER — ALBUTEROL SULFATE HFA 108 (90 BASE) MCG/ACT IN AERS: 1.0000 | INHALATION_SPRAY | Freq: Four times a day (QID) | RESPIRATORY_TRACT | Status: DC | PRN

## 2014-12-16 NOTE — Telephone Encounter (Signed)
Rx sent 

## 2014-12-16 NOTE — Telephone Encounter (Signed)
Pt forgot to put in a request for her inhaler

## 2014-12-29 ENCOUNTER — Telehealth: Payer: Self-pay

## 2014-12-29 NOTE — Telephone Encounter (Signed)
PA completed for estradiol on covermymeds. Pending.

## 2014-12-30 ENCOUNTER — Other Ambulatory Visit: Payer: Self-pay | Admitting: Physician Assistant

## 2014-12-30 NOTE — Telephone Encounter (Signed)
Please call in refill of Xanax as approved. 

## 2014-12-30 NOTE — Telephone Encounter (Signed)
PA approved through 05/08/15. Notified pharm. 

## 2014-12-31 ENCOUNTER — Other Ambulatory Visit: Payer: Self-pay | Admitting: Family Medicine

## 2014-12-31 MED ORDER — BLOOD GLUCOSE MONITOR KIT: PACK | Status: DC

## 2014-12-31 MED ORDER — LANCETS MISC: Status: DC

## 2014-12-31 MED ORDER — GLUCOSE BLOOD VI STRP: ORAL_STRIP | Status: DC

## 2014-12-31 NOTE — Telephone Encounter (Signed)
Called in.

## 2015-01-03 ENCOUNTER — Telehealth: Payer: Self-pay | Admitting: Family Medicine

## 2015-01-03 NOTE — Telephone Encounter (Signed)
lmom to call us back to see if she had gotten all her DM supplies plus her monitor please pass this to [facility] if she call back on Monday

## 2015-01-04 ENCOUNTER — Telehealth: Payer: Self-pay | Admitting: Physician Assistant

## 2015-01-04 ENCOUNTER — Emergency Department (HOSPITAL_COMMUNITY): Payer: Medicare HMO

## 2015-01-04 ENCOUNTER — Encounter (HOSPITAL_COMMUNITY): Payer: Self-pay

## 2015-01-04 ENCOUNTER — Emergency Department (HOSPITAL_COMMUNITY)
Admission: EM | Admit: 2015-01-04 | Discharge: 2015-01-04 | Disposition: A | Discharge status: Home / Self Care | Payer: Medicare HMO | Attending: Emergency Medicine | Admitting: Emergency Medicine

## 2015-01-04 DIAGNOSIS — Z8659 Personal history of other mental and behavioral disorders: Secondary | ICD-10-CM | POA: Diagnosis not present

## 2015-01-04 DIAGNOSIS — Z8739 Personal history of other diseases of the musculoskeletal system and connective tissue: Secondary | ICD-10-CM | POA: Insufficient documentation

## 2015-01-04 DIAGNOSIS — Y92410 Unspecified street and highway as the place of occurrence of the external cause: Secondary | ICD-10-CM | POA: Insufficient documentation

## 2015-01-04 DIAGNOSIS — G894 Chronic pain syndrome: Secondary | ICD-10-CM | POA: Diagnosis not present

## 2015-01-04 DIAGNOSIS — S39012A Strain of muscle, fascia and tendon of lower back, initial encounter: Secondary | ICD-10-CM | POA: Diagnosis not present

## 2015-01-04 DIAGNOSIS — N189 Chronic kidney disease, unspecified: Secondary | ICD-10-CM | POA: Insufficient documentation

## 2015-01-04 DIAGNOSIS — Z91040 Latex allergy status: Secondary | ICD-10-CM | POA: Diagnosis not present

## 2015-01-04 DIAGNOSIS — E119 Type 2 diabetes mellitus without complications: Secondary | ICD-10-CM | POA: Diagnosis not present

## 2015-01-04 DIAGNOSIS — Y9389 Activity, other specified: Secondary | ICD-10-CM | POA: Diagnosis not present

## 2015-01-04 DIAGNOSIS — Y998 Other external cause status: Secondary | ICD-10-CM | POA: Insufficient documentation

## 2015-01-04 DIAGNOSIS — I129 Hypertensive chronic kidney disease with stage 1 through stage 4 chronic kidney disease, or unspecified chronic kidney disease: Secondary | ICD-10-CM | POA: Diagnosis not present

## 2015-01-04 DIAGNOSIS — J45909 Unspecified asthma, uncomplicated: Secondary | ICD-10-CM | POA: Diagnosis not present

## 2015-01-04 DIAGNOSIS — S161XXA Strain of muscle, fascia and tendon at neck level, initial encounter: Secondary | ICD-10-CM | POA: Diagnosis not present

## 2015-01-04 DIAGNOSIS — Z8719 Personal history of other diseases of the digestive system: Secondary | ICD-10-CM | POA: Insufficient documentation

## 2015-01-04 DIAGNOSIS — S199XXA Unspecified injury of neck, initial encounter: Secondary | ICD-10-CM | POA: Diagnosis present

## 2015-01-04 MED ORDER — DIAZEPAM 5 MG PO TABS: 5.0000 mg | ORAL_TABLET | Freq: Three times a day (TID) | ORAL | Status: DC | PRN

## 2015-01-04 MED ORDER — DIAZEPAM 5 MG PO TABS
5.0000 mg | ORAL_TABLET | Freq: Once | ORAL | Status: AC
  Administered 2015-01-04: 5 mg via ORAL
  Filled 2015-01-04: qty 1

## 2015-01-04 MED ORDER — NAPROXEN 500 MG PO TABS: 500.0000 mg | ORAL_TABLET | Freq: Two times a day (BID) | ORAL | Status: DC

## 2015-01-04 MED ORDER — HYDROCODONE-ACETAMINOPHEN 5-325 MG PO TABS
1.0000 | ORAL_TABLET | Freq: Once | ORAL | Status: AC
  Administered 2015-01-04: 1 via ORAL
  Filled 2015-01-04: qty 1

## 2015-01-04 MED ORDER — HYDROCODONE-ACETAMINOPHEN 5-325 MG PO TABS: 1.0000 | ORAL_TABLET | Freq: Four times a day (QID) | ORAL | Status: DC | PRN

## 2015-01-04 NOTE — ED Notes (Signed)
Bed: WA02 Expected date:  Expected time:  Means of arrival:  Comments: Hold for ems

## 2015-01-04 NOTE — ED Notes (Signed)
Pt ambulates with on assist continues to c/o of dizziness.

## 2015-01-04 NOTE — ED Notes (Signed)
C collar removed per provider

## 2015-01-04 NOTE — Telephone Encounter (Signed)
Please call [facility].  Ask them for the most cost effective glucometer they carry. Ask them to dispense that one.

## 2015-01-04 NOTE — ED Notes (Signed)
Attempt to ambulate pt. Pt c/o dizziness on standing with sensation of "passing out" Provider aware.
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
## 2015-01-04 NOTE — ED Notes (Signed)
Pt alert  x4 respirations easy non labored Mae randomly

## 2015-01-04 NOTE — ED Notes (Signed)
Patient transported to X-ray 

## 2015-01-05 ENCOUNTER — Telehealth: Payer: Self-pay

## 2015-01-05 ENCOUNTER — Encounter: Payer: Self-pay | Admitting: Family Medicine

## 2015-01-05 MED ORDER — IPRATROPIUM BROMIDE 0.02 % IN SOLN: 0.5000 mg | Freq: Once | RESPIRATORY_TRACT | Status: DC

## 2015-01-05 MED ORDER — PROMETHAZINE HCL 25 MG PO TABS: 25.0000 mg | ORAL_TABLET | Freq: Four times a day (QID) | ORAL | Status: DC | PRN

## 2015-01-05 MED ORDER — ALBUTEROL SULFATE (2.5 MG/3ML) 0.083% IN NEBU: 2.5000 mg | INHALATION_SOLUTION | Freq: Once | RESPIRATORY_TRACT | Status: DC

## 2015-01-05 NOTE — Addendum Note (Signed)
Addended by: Ethelda Chick on: 01/05/2015 11:09 AM   Modules accepted: Orders

## 2015-01-05 NOTE — Telephone Encounter (Signed)
Rx sent. Pt notified on voicemail. 

## 2015-01-05 NOTE — Telephone Encounter (Signed)
Called [facility] was put on hold for 5 mins so I hung up.
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
## 2015-01-05 NOTE — Telephone Encounter (Signed)
Pt is needing a refill on her phenegan sent to [address]

## 2015-01-06 MED ORDER — FREESTYLE SYSTEM KIT: 1.0000 | PACK | Status: DC | PRN

## 2015-01-06 NOTE — Telephone Encounter (Signed)
Patient called to give an update on the pending order for diabetic supplies. She said [facility] on [address] is now saying that they do not have a record of any order placed for the meter, the strips, nothing.  She requests a call back.  Please advise.  Thank you.  CB#: [phone]

## 2015-01-06 NOTE — Telephone Encounter (Signed)
Left message for pt to call back  °

## 2015-01-06 NOTE — Telephone Encounter (Signed)
Spoke with pt, she states the pharmacy tod her its the [facility]. Will send in Rx to her pharmacy.

## 2015-01-07 ENCOUNTER — Encounter: Payer: Self-pay | Admitting: Family Medicine

## 2015-01-07 ENCOUNTER — Ambulatory Visit (INDEPENDENT_AMBULATORY_CARE_PROVIDER_SITE_OTHER): Payer: Medicare HMO | Admitting: Family Medicine

## 2015-01-07 ENCOUNTER — Ambulatory Visit (INDEPENDENT_AMBULATORY_CARE_PROVIDER_SITE_OTHER): Payer: Medicare HMO

## 2015-01-07 ENCOUNTER — Inpatient Hospital Stay (HOSPITAL_COMMUNITY): Admission: RE | Admit: 2015-01-07 | Payer: Self-pay | Source: Ambulatory Visit

## 2015-01-07 VITALS — BP 142/86 | HR 78 | Temp 98.5°F | Resp 16 | Ht 65.0 in | Wt 180.0 lb

## 2015-01-07 DIAGNOSIS — R112 Nausea with vomiting, unspecified: Secondary | ICD-10-CM

## 2015-01-07 DIAGNOSIS — M25512 Pain in left shoulder: Secondary | ICD-10-CM

## 2015-01-07 DIAGNOSIS — R42 Dizziness and giddiness: Secondary | ICD-10-CM

## 2015-01-07 DIAGNOSIS — S0093XA Contusion of unspecified part of head, initial encounter: Secondary | ICD-10-CM

## 2015-01-07 DIAGNOSIS — K219 Gastro-esophageal reflux disease without esophagitis: Secondary | ICD-10-CM

## 2015-01-07 DIAGNOSIS — S39012A Strain of muscle, fascia and tendon of lower back, initial encounter: Secondary | ICD-10-CM | POA: Diagnosis not present

## 2015-01-07 DIAGNOSIS — S161XXA Strain of muscle, fascia and tendon at neck level, initial encounter: Secondary | ICD-10-CM

## 2015-01-07 DIAGNOSIS — E875 Hyperkalemia: Secondary | ICD-10-CM

## 2015-01-07 LAB — POCT CBC
GRANULOCYTE PERCENT: 60.1 % (ref 37–80)
HEMATOCRIT: 36.7 % — AB (ref 37.7–47.9)
HEMOGLOBIN: 11.8 g/dL — AB (ref 12.2–16.2)
LYMPH, POC: 2.6 (ref 0.6–3.4)
MCH, POC: 25.8 pg — AB (ref 27–31.2)
MCHC: 32 g/dL (ref 31.8–35.4)
MCV: 80.6 fL (ref 80–97)
MID (cbc): 0.6 (ref 0–0.9)
MPV: 7.3 fL (ref 0–99.8)
PLATELET COUNT, POC: 342 10*3/uL (ref 142–424)
POC GRANULOCYTE: 4.8 (ref 2–6.9)
POC LYMPH %: 32.7 % (ref 10–50)
POC MID %: 7.2 %M (ref 0–12)
RBC: 4.55 M/uL (ref 4.04–5.48)
RDW, POC: 15 %
WBC: 8 10*3/uL (ref 4.6–10.2)

## 2015-01-07 LAB — GLUCOSE, POCT (MANUAL RESULT ENTRY): POC GLUCOSE: 84 mg/dL (ref 70–99)

## 2015-01-07 MED ORDER — ESOMEPRAZOLE MAGNESIUM 40 MG PO CPDR: 40.0000 mg | DELAYED_RELEASE_CAPSULE | Freq: Every day | ORAL | Status: DC

## 2015-01-07 MED ORDER — EPINEPHRINE 0.3 MG/0.3ML IJ SOAJ: 0.3000 mg | Freq: Once | INTRAMUSCULAR | Status: AC

## 2015-01-07 MED ORDER — POTASSIUM CHLORIDE CRYS ER 10 MEQ PO TBCR: 10.0000 meq | EXTENDED_RELEASE_TABLET | Freq: Once | ORAL | Status: DC

## 2015-01-07 MED ORDER — ALBUTEROL SULFATE (2.5 MG/3ML) 0.083% IN NEBU: 2.5000 mg | INHALATION_SOLUTION | Freq: Four times a day (QID) | RESPIRATORY_TRACT | Status: DC | PRN

## 2015-01-07 MED ORDER — PROMETHAZINE HCL 25 MG PO TABS: 25.0000 mg | ORAL_TABLET | Freq: Four times a day (QID) | ORAL | Status: DC | PRN

## 2015-01-07 NOTE — Patient Instructions (Signed)
Go to [facility] register at the Emergency Department for OUTPATIENT CT. DO NOT REGISTER AS ED PATIENT.
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
## 2015-01-08 ENCOUNTER — Inpatient Hospital Stay (HOSPITAL_COMMUNITY): Admission: RE | Admit: 2015-01-08 | Payer: Self-pay | Source: Ambulatory Visit

## 2015-01-08 ENCOUNTER — Ambulatory Visit (HOSPITAL_COMMUNITY)
Admission: RE | Admit: 2015-01-08 | Discharge: 2015-01-08 | Disposition: A | Discharge status: Home / Self Care | Payer: Medicare HMO | Source: Ambulatory Visit | Attending: Family Medicine | Admitting: Family Medicine

## 2015-01-08 DIAGNOSIS — R51 Headache: Secondary | ICD-10-CM | POA: Diagnosis not present

## 2015-01-08 DIAGNOSIS — R42 Dizziness and giddiness: Secondary | ICD-10-CM | POA: Diagnosis present

## 2015-01-08 DIAGNOSIS — I7389 Other specified peripheral vascular diseases: Secondary | ICD-10-CM | POA: Diagnosis not present

## 2015-01-08 DIAGNOSIS — S0093XA Contusion of unspecified part of head, initial encounter: Secondary | ICD-10-CM

## 2015-01-08 DIAGNOSIS — R112 Nausea with vomiting, unspecified: Secondary | ICD-10-CM

## 2015-01-08 LAB — COMPREHENSIVE METABOLIC PANEL
ALK PHOS: 46 U/L (ref 33–130)
ALT: 13 U/L (ref 6–29)
AST: 15 U/L (ref 10–35)
Albumin: 4.1 g/dL (ref 3.6–5.1)
BUN: 36 mg/dL — AB (ref 7–25)
CHLORIDE: 102 mmol/L (ref 98–110)
CO2: 21 mmol/L (ref 20–31)
CREATININE: 1.6 mg/dL — AB (ref 0.50–0.99)
Calcium: 9.4 mg/dL (ref 8.6–10.4)
GLUCOSE: 82 mg/dL (ref 65–99)
POTASSIUM: 5.8 mmol/L — AB (ref 3.5–5.3)
SODIUM: 140 mmol/L (ref 135–146)
Total Bilirubin: 0.2 mg/dL (ref 0.2–1.2)
Total Protein: 6.7 g/dL (ref 6.1–8.1)

## 2015-01-08 NOTE — Addendum Note (Signed)
Addended by: Eddie Candle on: 01/08/2015 11:21 AM   Modules accepted: Orders

## 2015-01-09 NOTE — Addendum Note (Signed)
Addended by: Ethelda Chick on: 01/09/2015 01:16 PM   Modules accepted: Orders

## 2015-01-12 ENCOUNTER — Other Ambulatory Visit: Payer: Self-pay

## 2015-01-12 MED ORDER — GLUCOSE BLOOD VI STRP: ORAL_STRIP | Status: DC

## 2015-01-12 MED ORDER — LANCETS MISC: Status: DC

## 2015-01-12 MED ORDER — FREESTYLE SYSTEM KIT: PACK | Status: DC

## 2015-02-03 ENCOUNTER — Telehealth: Payer: Self-pay

## 2015-02-03 NOTE — Telephone Encounter (Signed)
Waiting on payment of $18.75 from [facility] [employer]. Faxed letter over on 02/03/15.

## 2015-02-15 DIAGNOSIS — Z79891 Long term (current) use of opiate analgesic: Secondary | ICD-10-CM | POA: Diagnosis not present

## 2015-02-15 DIAGNOSIS — M50322 Other cervical disc degeneration at C5-C6 level: Secondary | ICD-10-CM | POA: Diagnosis not present

## 2015-02-15 DIAGNOSIS — M542 Cervicalgia: Secondary | ICD-10-CM | POA: Diagnosis not present

## 2015-02-15 DIAGNOSIS — G894 Chronic pain syndrome: Secondary | ICD-10-CM | POA: Diagnosis not present

## 2015-02-16 NOTE — Telephone Encounter (Signed)
RECEIVED PAYMENT FROM [employer] WILL FAX OVER RECORDS.

## 2015-02-18 ENCOUNTER — Other Ambulatory Visit: Payer: Self-pay | Admitting: Family Medicine

## 2015-02-18 NOTE — Telephone Encounter (Signed)
Please call in or fax in refill of Xanax as approved.

## 2015-02-19 NOTE — Telephone Encounter (Signed)
Faxed

## 2015-02-22 DIAGNOSIS — Z0271 Encounter for disability determination: Secondary | ICD-10-CM

## 2015-02-24 ENCOUNTER — Telehealth: Payer: Self-pay | Admitting: Family Medicine

## 2015-02-24 ENCOUNTER — Ambulatory Visit: Payer: Medicare HMO | Admitting: Family Medicine

## 2015-02-24 ENCOUNTER — Other Ambulatory Visit: Payer: Self-pay

## 2015-02-24 DIAGNOSIS — J4522 Mild intermittent asthma with status asthmaticus: Secondary | ICD-10-CM

## 2015-02-24 MED ORDER — ESTRADIOL 0.06 MG/24HR TD PTWK: MEDICATED_PATCH | TRANSDERMAL | Status: DC

## 2015-02-24 NOTE — Telephone Encounter (Signed)
Patient request for a nebulizer. Patient received the breathing treatment but not the machine. Patient stated the machine had to be mailed. Patient have not heard anything else about this. Please call patient [phone].

## 2015-02-25 NOTE — Telephone Encounter (Signed)
Yes. Please order a nebulizer machine with tubing for patient.

## 2015-02-25 NOTE — Telephone Encounter (Signed)
I do not see an order for this but i can handle this if you want to Rx.

## 2015-02-26 ENCOUNTER — Ambulatory Visit: Payer: Medicare HMO | Admitting: Family Medicine

## 2015-02-26 DIAGNOSIS — J4522 Mild intermittent asthma with status asthmaticus: Secondary | ICD-10-CM | POA: Diagnosis not present

## 2015-02-26 NOTE — Telephone Encounter (Signed)
Order placed and [employer] notified.

## 2015-03-04 ENCOUNTER — Telehealth: Payer: Self-pay

## 2015-03-04 NOTE — Telephone Encounter (Signed)
Patient is calling because the pharmacy never received a refill for xanax. I informed patient that we will contact the pharmacy.

## 2015-03-04 NOTE — Telephone Encounter (Signed)
Pt called and reported that pharm stated that we denied this. Had operator tell pt that it was faxed to pharm on 10/14, and that I will call it in again now. Done.

## 2015-03-05 NOTE — Telephone Encounter (Signed)
This was done, see notes under 10/13 message.

## 2015-03-09 NOTE — Telephone Encounter (Signed)
Received a fax for "Confirmation of Order" that Dr Katrinka BlazingSmith needs to sign. I will put it in her box.

## 2015-03-11 ENCOUNTER — Other Ambulatory Visit: Payer: Self-pay | Admitting: Family Medicine

## 2015-03-11 NOTE — Telephone Encounter (Signed)
Please call in refill of Xanax as approved. 

## 2015-03-12 NOTE — Telephone Encounter (Signed)
Faxed

## 2015-03-25 DIAGNOSIS — H43391 Other vitreous opacities, right eye: Secondary | ICD-10-CM | POA: Diagnosis not present

## 2015-03-25 DIAGNOSIS — H524 Presbyopia: Secondary | ICD-10-CM | POA: Diagnosis not present

## 2015-03-25 DIAGNOSIS — H52203 Unspecified astigmatism, bilateral: Secondary | ICD-10-CM | POA: Diagnosis not present

## 2015-03-25 DIAGNOSIS — H5203 Hypermetropia, bilateral: Secondary | ICD-10-CM | POA: Diagnosis not present

## 2015-03-25 DIAGNOSIS — H2513 Age-related nuclear cataract, bilateral: Secondary | ICD-10-CM | POA: Diagnosis not present

## 2015-03-25 NOTE — Telephone Encounter (Signed)
Form signed.

## 2015-03-29 DIAGNOSIS — J4522 Mild intermittent asthma with status asthmaticus: Secondary | ICD-10-CM | POA: Diagnosis not present

## 2015-03-31 ENCOUNTER — Telehealth: Payer: Self-pay

## 2015-03-31 MED ORDER — BLOOD GLUCOSE METER KIT: PACK | Status: DC

## 2015-03-31 NOTE — Telephone Encounter (Signed)
[facility] in [place] is calling for a new prescriptions request. Patient needs a prescription for a meter with lancetts and testing strips.    Pharmacy phone: [phone]

## 2015-03-31 NOTE — Telephone Encounter (Signed)
Meter sent

## 2015-04-05 ENCOUNTER — Telehealth: Payer: Self-pay

## 2015-04-05 NOTE — Telephone Encounter (Signed)
Christine from [facility] called agin to request a refill of blood glucose meter kit and supplies in behalf of this pt.   [place]

## 2015-04-06 MED ORDER — LANCETS MISC: Status: DC

## 2015-04-06 MED ORDER — GLUCOSE BLOOD VI STRP: ORAL_STRIP | Status: AC

## 2015-04-06 NOTE — Telephone Encounter (Signed)
The meter kit had been sent in previously, but not strips and lancets separately. I sent in Rxs for both.

## 2015-04-07 ENCOUNTER — Other Ambulatory Visit: Payer: Self-pay

## 2015-04-07 DIAGNOSIS — R69 Illness, unspecified: Secondary | ICD-10-CM | POA: Diagnosis not present

## 2015-04-07 MED ORDER — ALCOHOL SWABS PADS: MEDICATED_PAD | Status: DC

## 2015-04-07 MED ORDER — LANCING DEVICE MISC: Status: DC

## 2015-04-12 ENCOUNTER — Encounter: Payer: Self-pay | Admitting: Family Medicine

## 2015-04-12 ENCOUNTER — Ambulatory Visit (INDEPENDENT_AMBULATORY_CARE_PROVIDER_SITE_OTHER): Payer: Medicare HMO | Admitting: Family Medicine

## 2015-04-12 VITALS — BP 128/78 | HR 72 | Temp 97.6°F | Resp 16 | Ht 65.0 in | Wt 179.2 lb

## 2015-04-12 DIAGNOSIS — E785 Hyperlipidemia, unspecified: Secondary | ICD-10-CM

## 2015-04-12 DIAGNOSIS — E1142 Type 2 diabetes mellitus with diabetic polyneuropathy: Secondary | ICD-10-CM

## 2015-04-12 DIAGNOSIS — Z23 Encounter for immunization: Secondary | ICD-10-CM | POA: Diagnosis not present

## 2015-04-12 DIAGNOSIS — N182 Chronic kidney disease, stage 2 (mild): Secondary | ICD-10-CM

## 2015-04-12 DIAGNOSIS — I10 Essential (primary) hypertension: Secondary | ICD-10-CM | POA: Diagnosis not present

## 2015-04-12 DIAGNOSIS — R1011 Right upper quadrant pain: Secondary | ICD-10-CM

## 2015-04-12 DIAGNOSIS — Z111 Encounter for screening for respiratory tuberculosis: Secondary | ICD-10-CM

## 2015-04-12 DIAGNOSIS — R1112 Projectile vomiting: Secondary | ICD-10-CM | POA: Diagnosis not present

## 2015-04-12 LAB — COMPREHENSIVE METABOLIC PANEL
ALBUMIN: 4.1 g/dL (ref 3.6–5.1)
ALK PHOS: 45 U/L (ref 33–130)
ALT: 14 U/L (ref 6–29)
AST: 17 U/L (ref 10–35)
BILIRUBIN TOTAL: 0.3 mg/dL (ref 0.2–1.2)
BUN: 22 mg/dL (ref 7–25)
CALCIUM: 8.8 mg/dL (ref 8.6–10.4)
CO2: 28 mmol/L (ref 20–31)
Chloride: 104 mmol/L (ref 98–110)
Creat: 1.34 mg/dL — ABNORMAL HIGH (ref 0.50–0.99)
Glucose, Bld: 102 mg/dL — ABNORMAL HIGH (ref 65–99)
POTASSIUM: 5.6 mmol/L — AB (ref 3.5–5.3)
Sodium: 137 mmol/L (ref 135–146)
TOTAL PROTEIN: 6.5 g/dL (ref 6.1–8.1)

## 2015-04-12 LAB — LIPID PANEL
CHOLESTEROL: 228 mg/dL — AB (ref 125–200)
HDL: 50 mg/dL (ref >=46)
LDL Cholesterol: 138 mg/dL — ABNORMAL HIGH (ref <130)
TRIGLYCERIDES: 201 mg/dL — AB (ref <150)
Total CHOL/HDL Ratio: 4.6 Ratio (ref <=5.0)
VLDL: 40 mg/dL — AB (ref <30)

## 2015-04-12 LAB — CBC WITH DIFFERENTIAL/PLATELET
BASOS ABS: 0.1 10*3/uL (ref 0.0–0.1)
Basophils Relative: 1 % (ref 0–1)
Eosinophils Absolute: 0.3 10*3/uL (ref 0.0–0.7)
Eosinophils Relative: 5 % (ref 0–5)
HEMATOCRIT: 37 % (ref 36.0–46.0)
HEMOGLOBIN: 12.1 g/dL (ref 12.0–15.0)
Lymphocytes Relative: 36 % (ref 12–46)
Lymphs Abs: 2.1 10*3/uL (ref 0.7–4.0)
MCH: 26.7 pg (ref 26.0–34.0)
MCHC: 32.7 g/dL (ref 30.0–36.0)
MCV: 81.7 fL (ref 78.0–100.0)
MONO ABS: 0.5 10*3/uL (ref 0.1–1.0)
MONOS PCT: 9 % (ref 3–12)
MPV: 10 fL (ref 8.6–12.4)
NEUTROS PCT: 49 % (ref 43–77)
Neutro Abs: 2.9 10*3/uL (ref 1.7–7.7)
Platelets: 333 10*3/uL (ref 150–400)
RBC: 4.53 MIL/uL (ref 3.87–5.11)
RDW: 15.4 % (ref 11.5–15.5)
WBC: 5.9 10*3/uL (ref 4.0–10.5)

## 2015-04-12 LAB — HEMOGLOBIN A1C
HEMOGLOBIN A1C: 7.3 % — AB (ref <5.7)
MEAN PLASMA GLUCOSE: 163 mg/dL — AB (ref <117)

## 2015-04-12 MED ORDER — BUDESONIDE-FORMOTEROL FUMARATE 80-4.5 MCG/ACT IN AERO: 2.0000 | INHALATION_SPRAY | Freq: Two times a day (BID) | RESPIRATORY_TRACT | Status: DC

## 2015-04-12 MED ORDER — PROMETHAZINE HCL 12.5 MG PO TABS: 12.5000 mg | ORAL_TABLET | Freq: Three times a day (TID) | ORAL | Status: DC | PRN

## 2015-04-12 MED ORDER — FLUOXETINE HCL 20 MG PO TABS: 60.0000 mg | ORAL_TABLET | Freq: Every day | ORAL | Status: DC

## 2015-04-12 NOTE — Patient Instructions (Addendum)
1.  Start taking Allegra-D one tablet every monring 2.  Start taking Nasacort nasal spray once daily. 3.  Only use Methocarbamol/Robaxin muscle relaxer as needed and not daily. 4. Increase Prozac to three tablets daily.

## 2015-04-12 NOTE — Progress Notes (Signed)
  Tuberculosis Risk Questionnaire  1. No Were you born outside the [place] in one of the following parts of the world: [place], [place], [place], [place] or [place]?    2. No Have you traveled outside the [place] and lived for more than one month in one of the following parts of the world: [place], [place], [place], [place] or [place]?    3. Yes diabetes Do you have a compromised immune system such as from any of the following conditions:HIV/AIDS, organ or bone marrow transplantation, diabetes, immunosuppressive medicines (e.g. Prednisone, Remicaide), leukemia, lymphoma, cancer of the head or neck, gastrectomy or jejunal bypass, end-stage renal disease (on dialysis), or silicosis?     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    5. No Have you ever: injected illegal drugs, used crack cocaine, lived in a homeless shelter  or been in jail or prison?     6. No Have you ever been exposed to anyone with infectious tuberculosis?    Tuberculosis Symptom Questionnaire  Do you currently have any of the following symptoms?  1. No Unexplained cough lasting more than 3 weeks?   2. No Unexplained fever lasting more than 3 weeks.   3. No Night Sweats (sweating that leaves the bedclothes and sheets wet)     4. No Shortness of Breath   5. No Chest Pain   6. No Unintentional weight loss    7. No Unexplained fatigue (very tired for no reason)
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
## 2015-04-14 ENCOUNTER — Ambulatory Visit (INDEPENDENT_AMBULATORY_CARE_PROVIDER_SITE_OTHER): Payer: Medicare HMO

## 2015-04-14 ENCOUNTER — Telehealth: Payer: Self-pay

## 2015-04-14 DIAGNOSIS — Z111 Encounter for screening for respiratory tuberculosis: Secondary | ICD-10-CM

## 2015-04-14 LAB — TB SKIN TEST
Induration: 0 mm
TB SKIN TEST: NEGATIVE

## 2015-04-14 NOTE — Progress Notes (Signed)
   Subjective:    Patient ID: Dana Romero, female    DOB: 01-18-1951, 64 y.o.   MRN: [id number]  HPI Pt. Was here for a PPD reading. Results negative. Induration 0.200mm. Patient was given a copy of her results.     Review of Systems     Objective:   Physical Exam        Assessment & Plan:

## 2015-04-14 NOTE — Telephone Encounter (Signed)
Pt stopped in and she left a medication box for Dr Katrinka BlazingSmith. I put the box in Dr Boston ScientificSmith's mailbox. She can be reached @ [phone]. Thank you

## 2015-04-16 ENCOUNTER — Telehealth: Payer: Self-pay | Admitting: *Deleted

## 2015-04-16 ENCOUNTER — Other Ambulatory Visit: Payer: Self-pay

## 2015-04-16 MED ORDER — TEMAZEPAM 30 MG PO CAPS: ORAL_CAPSULE | ORAL | Status: DC

## 2015-04-16 NOTE — Telephone Encounter (Signed)
Please call in Temazepam as approved. 

## 2015-04-16 NOTE — Telephone Encounter (Signed)
Pharm reqs RF of temazepam.

## 2015-04-16 NOTE — Telephone Encounter (Signed)
Fax[facility]. Left message on patient's voice-mail.

## 2015-04-19 NOTE — Telephone Encounter (Signed)
Called in.

## 2015-04-28 ENCOUNTER — Other Ambulatory Visit: Payer: Self-pay | Admitting: Family Medicine

## 2015-04-28 ENCOUNTER — Ambulatory Visit
Admission: RE | Admit: 2015-04-28 | Discharge: 2015-04-28 | Disposition: A | Discharge status: Home / Self Care | Payer: Medicare HMO | Source: Ambulatory Visit | Attending: Family Medicine | Admitting: Family Medicine

## 2015-04-28 DIAGNOSIS — R1011 Right upper quadrant pain: Secondary | ICD-10-CM

## 2015-04-28 DIAGNOSIS — R1112 Projectile vomiting: Secondary | ICD-10-CM

## 2015-04-28 DIAGNOSIS — J4522 Mild intermittent asthma with status asthmaticus: Secondary | ICD-10-CM | POA: Diagnosis not present

## 2015-05-10 ENCOUNTER — Other Ambulatory Visit: Payer: Self-pay | Admitting: Family Medicine

## 2015-05-11 ENCOUNTER — Telehealth: Payer: Self-pay

## 2015-05-11 DIAGNOSIS — R1011 Right upper quadrant pain: Secondary | ICD-10-CM

## 2015-05-11 DIAGNOSIS — R1112 Projectile vomiting: Secondary | ICD-10-CM

## 2015-05-11 MED ORDER — SITAGLIPTIN PHOSPHATE 50 MG PO TABS: 50.0000 mg | ORAL_TABLET | Freq: Every day | ORAL | Status: DC

## 2015-05-11 NOTE — Telephone Encounter (Signed)
Pt states she had a Ultra Sound done and really need to know results. Please call [phone]

## 2015-05-11 NOTE — Telephone Encounter (Signed)
Called and lmom to cb.   Results are as follows: No gallstones but sonographer thought d/t pain on exam - could represent early gallbladder infection. Fatty infiltration of liver  Kidneys normal Aorta normal It was hard to see the pancreas but from what they could see, it was normal.  If she is still having problems with RUQ abdominal pain, would recommend sending to general surgeon to see if she needs to have her gallbladder removed.

## 2015-05-11 NOTE — Addendum Note (Signed)
Addended by: Ethelda ChickSMITH, Waylyn Tenbrink M on: 05/11/2015 03:51 PM   Modules accepted: Orders

## 2015-05-11 NOTE — Telephone Encounter (Signed)
How is her nausea how many of Promethazine is she taking?

## 2015-05-12 NOTE — Telephone Encounter (Signed)
Dr Katrinka BlazingSmith, it looks like you Rx this fairly regularly for pt, and discussed at last OV. Do you want to give RFs?

## 2015-05-13 NOTE — Telephone Encounter (Signed)
R11.12 (ICD-10-CM) - Projectile vomiting with nausea    R10.11 (ICD-10-CM) - Abdominal pain, RUQ       Is it possible to get her in as soon as possible.

## 2015-05-13 NOTE — Telephone Encounter (Signed)
Spoke to pt, she is more nauseated and her pain is worse. I gave her the results from [facility]. She agreed to go to the general surgeon for evaluation. She is taking one Promethazine per day.

## 2015-05-14 ENCOUNTER — Other Ambulatory Visit: Payer: Self-pay

## 2015-05-14 DIAGNOSIS — R1112 Projectile vomiting: Secondary | ICD-10-CM

## 2015-05-14 DIAGNOSIS — R1011 Right upper quadrant pain: Secondary | ICD-10-CM

## 2015-05-18 ENCOUNTER — Ambulatory Visit (HOSPITAL_COMMUNITY): Payer: Medicare HMO

## 2015-05-26 MED ORDER — ESTRADIOL 0.075 MG/24HR TD PTTW: 1.0000 | MEDICATED_PATCH | TRANSDERMAL | Status: DC

## 2015-05-26 NOTE — Telephone Encounter (Signed)
Call --- I sent in rx for [place] patch as patient requested.  Please remind patient to have her mammogram performed; this is very important if she is going to continue hormone replacement therapy.

## 2015-05-26 NOTE — Telephone Encounter (Signed)
Called pt and advised message from provider on their voicemail.  

## 2015-05-27 DIAGNOSIS — K828 Other specified diseases of gallbladder: Secondary | ICD-10-CM | POA: Diagnosis not present

## 2015-05-29 DIAGNOSIS — J4522 Mild intermittent asthma with status asthmaticus: Secondary | ICD-10-CM | POA: Diagnosis not present

## 2015-06-07 ENCOUNTER — Other Ambulatory Visit: Payer: Self-pay | Admitting: Family Medicine

## 2015-06-14 ENCOUNTER — Encounter (HOSPITAL_COMMUNITY)
Admission: RE | Admit: 2015-06-14 | Discharge: 2015-06-14 | Disposition: A | Discharge status: Home / Self Care | Payer: Medicare HMO | Source: Ambulatory Visit | Attending: Family Medicine | Admitting: Family Medicine

## 2015-06-14 DIAGNOSIS — R1112 Projectile vomiting: Secondary | ICD-10-CM | POA: Diagnosis not present

## 2015-06-14 DIAGNOSIS — R1011 Right upper quadrant pain: Secondary | ICD-10-CM | POA: Diagnosis not present

## 2015-06-14 MED ORDER — SINCALIDE 5 MCG IJ SOLR
0.0200 ug/kg | Freq: Once | INTRAMUSCULAR | Status: AC
  Administered 2015-06-14: 1.6 ug via INTRAVENOUS

## 2015-06-14 MED ORDER — TECHNETIUM TC 99M MEBROFENIN IV KIT
7.9000 | PACK | Freq: Once | INTRAVENOUS | Status: AC | PRN
Start: 2015-06-14 — End: 2015-06-14
  Administered 2015-06-14: 8 via INTRAVENOUS

## 2015-06-15 ENCOUNTER — Other Ambulatory Visit: Payer: Self-pay | Admitting: Family Medicine

## 2015-06-15 DIAGNOSIS — R112 Nausea with vomiting, unspecified: Secondary | ICD-10-CM

## 2015-06-21 ENCOUNTER — Telehealth: Payer: Self-pay

## 2015-06-21 NOTE — Telephone Encounter (Signed)
PA completed on covermymeds for promethazine. Pt has intermittent episodes of nausea/vomiting due to chronic kidney disease and has been using promethazine since 05/09/12 to control sxs. PA Pending.

## 2015-06-24 NOTE — Telephone Encounter (Signed)
PA approved through 05/07/16. Notified pharm.  

## 2015-06-25 DIAGNOSIS — Z79891 Long term (current) use of opiate analgesic: Secondary | ICD-10-CM | POA: Diagnosis not present

## 2015-06-25 DIAGNOSIS — R69 Illness, unspecified: Secondary | ICD-10-CM | POA: Diagnosis not present

## 2015-06-25 DIAGNOSIS — M542 Cervicalgia: Secondary | ICD-10-CM | POA: Diagnosis not present

## 2015-06-25 DIAGNOSIS — M50322 Other cervical disc degeneration at C5-C6 level: Secondary | ICD-10-CM | POA: Diagnosis not present

## 2015-06-25 DIAGNOSIS — G894 Chronic pain syndrome: Secondary | ICD-10-CM | POA: Diagnosis not present

## 2015-06-29 DIAGNOSIS — J4522 Mild intermittent asthma with status asthmaticus: Secondary | ICD-10-CM | POA: Diagnosis not present

## 2015-07-02 ENCOUNTER — Other Ambulatory Visit (HOSPITAL_COMMUNITY): Payer: Self-pay | Admitting: General Surgery

## 2015-07-02 DIAGNOSIS — K30 Functional dyspepsia: Secondary | ICD-10-CM | POA: Diagnosis not present

## 2015-07-02 DIAGNOSIS — K828 Other specified diseases of gallbladder: Secondary | ICD-10-CM | POA: Diagnosis not present

## 2015-07-12 ENCOUNTER — Other Ambulatory Visit: Payer: Self-pay | Admitting: Family Medicine

## 2015-07-19 ENCOUNTER — Encounter (HOSPITAL_COMMUNITY): Payer: Self-pay

## 2015-07-19 ENCOUNTER — Encounter: Payer: Medicare HMO | Admitting: Family Medicine

## 2015-07-19 ENCOUNTER — Ambulatory Visit (HOSPITAL_COMMUNITY)
Admission: RE | Admit: 2015-07-19 | Discharge: 2015-07-19 | Disposition: A | Discharge status: Home / Self Care | Payer: Medicare HMO | Source: Ambulatory Visit | Attending: General Surgery | Admitting: General Surgery

## 2015-07-19 DIAGNOSIS — K828 Other specified diseases of gallbladder: Secondary | ICD-10-CM | POA: Diagnosis not present

## 2015-07-19 DIAGNOSIS — R140 Abdominal distension (gaseous): Secondary | ICD-10-CM | POA: Diagnosis not present

## 2015-07-19 MED ORDER — TECHNETIUM TC 99M SULFUR COLLOID
2.0000 | Freq: Once | INTRAVENOUS | Status: AC | PRN
  Administered 2015-07-19: 2 via ORAL

## 2015-07-20 ENCOUNTER — Other Ambulatory Visit: Payer: Self-pay | Admitting: Family Medicine

## 2015-07-20 NOTE — Telephone Encounter (Signed)
1. Please call in refill of Xanax as approved.  2.  Refill of Phenergan not approved; I [facility] month due to nausea; has she seen the GI specialist yet regarding nausea?

## 2015-07-21 ENCOUNTER — Telehealth: Payer: Self-pay | Admitting: Family Medicine

## 2015-07-21 NOTE — Telephone Encounter (Signed)
Rx faxed

## 2015-07-21 NOTE — Telephone Encounter (Signed)
Faxed

## 2015-07-26 ENCOUNTER — Other Ambulatory Visit: Payer: Self-pay | Admitting: Family Medicine

## 2015-07-27 DIAGNOSIS — J4522 Mild intermittent asthma with status asthmaticus: Secondary | ICD-10-CM | POA: Diagnosis not present

## 2015-07-30 ENCOUNTER — Other Ambulatory Visit: Payer: Self-pay | Admitting: Family Medicine

## 2015-07-30 ENCOUNTER — Ambulatory Visit: Payer: Self-pay | Admitting: General Surgery

## 2015-07-30 NOTE — Telephone Encounter (Signed)
Please call in refill of Restoril/Temazepam as approved.

## 2015-07-30 NOTE — Telephone Encounter (Signed)
Called in.

## 2015-07-31 NOTE — Telephone Encounter (Signed)
What pharmacy would she like her prescription sent to?

## 2015-08-02 NOTE — Telephone Encounter (Signed)
This was already called in to the pharm who req'd the refill, below.

## 2015-08-03 ENCOUNTER — Encounter: Payer: Medicare HMO | Admitting: Family Medicine

## 2015-08-04 ENCOUNTER — Other Ambulatory Visit: Payer: Self-pay | Admitting: Family Medicine

## 2015-08-05 ENCOUNTER — Encounter (HOSPITAL_COMMUNITY)
Admission: RE | Admit: 2015-08-05 | Discharge: 2015-08-05 | Disposition: A | Discharge status: Home / Self Care | Payer: Medicare HMO | Source: Ambulatory Visit | Attending: General Surgery | Admitting: General Surgery

## 2015-08-05 ENCOUNTER — Encounter (HOSPITAL_COMMUNITY): Payer: Self-pay

## 2015-08-05 DIAGNOSIS — I10 Essential (primary) hypertension: Secondary | ICD-10-CM | POA: Insufficient documentation

## 2015-08-05 DIAGNOSIS — E119 Type 2 diabetes mellitus without complications: Secondary | ICD-10-CM | POA: Diagnosis not present

## 2015-08-05 DIAGNOSIS — Z01812 Encounter for preprocedural laboratory examination: Secondary | ICD-10-CM | POA: Insufficient documentation

## 2015-08-05 DIAGNOSIS — Z01810 Encounter for preprocedural cardiovascular examination: Secondary | ICD-10-CM | POA: Diagnosis not present

## 2015-08-05 HISTORY — DX: Personal history of other medical treatment: Z92.89

## 2015-08-05 HISTORY — DX: Angina pectoris, unspecified: I20.9

## 2015-08-05 LAB — BASIC METABOLIC PANEL
ANION GAP: 8 (ref 5–15)
BUN: 24 mg/dL — ABNORMAL HIGH (ref 6–20)
CALCIUM: 9.2 mg/dL (ref 8.9–10.3)
CO2: 27 mmol/L (ref 22–32)
Chloride: 104 mmol/L (ref 101–111)
Creatinine, Ser: 1.41 mg/dL — ABNORMAL HIGH (ref 0.44–1.00)
GFR calc non Af Amer: 38 mL/min — ABNORMAL LOW (ref >60)
GFR, EST AFRICAN AMERICAN: 44 mL/min — AB (ref >60)
GLUCOSE: 98 mg/dL (ref 65–99)
POTASSIUM: 5.1 mmol/L (ref 3.5–5.1)
SODIUM: 139 mmol/L (ref 135–145)

## 2015-08-05 LAB — CBC
HCT: 36.9 % (ref 36.0–46.0)
HEMOGLOBIN: 11.9 g/dL — AB (ref 12.0–15.0)
MCH: 26.4 pg (ref 26.0–34.0)
MCHC: 32.2 g/dL (ref 30.0–36.0)
MCV: 82 fL (ref 78.0–100.0)
Platelets: 313 10*3/uL (ref 150–400)
RBC: 4.5 MIL/uL (ref 3.87–5.11)
RDW: 14.9 % (ref 11.5–15.5)
WBC: 7.5 10*3/uL (ref 4.0–10.5)
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
## 2015-08-05 NOTE — Patient Instructions (Addendum)
Dana Romero  08/05/2015   Your procedure is scheduled on: 08-11-15  [facility]  [facility] 3rd floor to  Short Stay Center at 0830 AM.  Call this number if you have problems the morning of surgery [phone]   Remember: ONLY 1 PERSON MAY GO WITH YOU TO SHORT STAY TO GET  READY MORNING OF YOUR SURGERY.  Do not eat food or drink liquids :After Midnight.     Take these medicines the morning of surgery with A SIP OF WATER: Carvedilol. Nexium. Xanax-if need.Use/bring Inhalers,eye drops, Nasal spray. DO NOT TAKE ANY DIABETIC MEDICATIONS DAY OF YOUR SURGERY                               You may not have any metal on your body including hair pins and              piercings  Do not wear jewelry, make-up, lotions, powders or perfumes, deodorant             Do not wear nail polish.  Do not shave  48 hours prior to surgery.              Men may shave face and neck.   Do not bring valuables to the hospital. [facility] IS NOT             RESPONSIBLE   FOR VALUABLES.  Contacts, dentures or bridgework may not be worn into surgery.  Leave suitcase in the car. After surgery it may be brought to your room.     Patients discharged the day of surgery will not be allowed to drive home.  Name and phone number of your driver: Dana Romero-spouse [phone] cell  Special Instructions: N/A              Please read over the following fact sheets you were given: _____________________________________________________________________             [facility] - Preparing for [facility] Before surgery, you can play an important role.  Because skin is not sterile, your skin needs to be as free of germs as possible.  You can reduce the number of germs on your skin by washing with CHG (chlorahexidine gluconate) soap before surgery.  CHG is an antiseptic cleaner which kills germs and bonds with the skin to continue killing germs even after  washing. Please DO NOT use if you have an allergy to CHG or antibacterial soaps.  If your skin becomes reddened/irritated stop using the CHG and inform your nurse when you arrive at Short Stay. Do not shave (including legs and underarms) for at least 48 hours prior to the first CHG shower.  You may shave your face/neck. Please follow these instructions carefully:  1.  Shower with CHG Soap the night before surgery and the  morning of Surgery.  2.  If you choose to wash your hair, wash your hair first as usual with your  normal  shampoo.  3.  After you shampoo, rinse your hair and body thoroughly to remove the  shampoo.                           4.  Use CHG as you would any other liquid soap.  You  can apply chg directly  to the skin and wash                       Gently with a scrungie or clean washcloth.  5.  Apply the CHG Soap to your body ONLY FROM THE NECK DOWN.   Do not use on face/ open                           Wound or open sores. Avoid contact with eyes, ears mouth and genitals (private parts).                       Wash face,  Genitals (private parts) with your normal soap.             6.  Wash thoroughly, paying special attention to the area where your surgery  will be performed.  7.  Thoroughly rinse your body with warm water from the neck down.  8.  DO NOT shower/wash with your normal soap after using and rinsing off  the CHG Soap.                9.  Pat yourself dry with a clean towel.            10.  Wear clean pajamas.            11.  Place clean sheets on your bed the night of your first shower and do not  sleep with pets. Day of Surgery : Do not apply any lotions/deodorants the morning of surgery.  Please wear clean clothes to the hospital/surgery center.  FAILURE TO FOLLOW THESE INSTRUCTIONS MAY RESULT IN THE CANCELLATION OF YOUR SURGERY PATIENT SIGNATURE_________________________________  NURSE  SIGNATURE__________________________________  ________________________________________________________________________

## 2015-08-06 LAB — HEMOGLOBIN A1C
HEMOGLOBIN A1C: 7.3 % — AB (ref 4.8–5.6)
Mean Plasma Glucose: 163 mg/dL

## 2015-08-11 ENCOUNTER — Ambulatory Visit (HOSPITAL_COMMUNITY): Payer: Medicare HMO | Admitting: Anesthesiology

## 2015-08-11 ENCOUNTER — Encounter (HOSPITAL_COMMUNITY): Payer: Self-pay | Admitting: *Deleted

## 2015-08-11 ENCOUNTER — Ambulatory Visit (HOSPITAL_COMMUNITY)
Admission: RE | Admit: 2015-08-11 | Discharge: 2015-08-11 | Disposition: A | Discharge status: Home / Self Care | Payer: Medicare HMO | Source: Ambulatory Visit | Attending: General Surgery | Admitting: General Surgery

## 2015-08-11 ENCOUNTER — Encounter (HOSPITAL_COMMUNITY)
Admission: RE | Disposition: A | Discharge status: Home / Self Care | Payer: Self-pay | Source: Ambulatory Visit | Attending: General Surgery

## 2015-08-11 DIAGNOSIS — K811 Chronic cholecystitis: Secondary | ICD-10-CM | POA: Insufficient documentation

## 2015-08-11 DIAGNOSIS — J45909 Unspecified asthma, uncomplicated: Secondary | ICD-10-CM | POA: Insufficient documentation

## 2015-08-11 DIAGNOSIS — I129 Hypertensive chronic kidney disease with stage 1 through stage 4 chronic kidney disease, or unspecified chronic kidney disease: Secondary | ICD-10-CM | POA: Insufficient documentation

## 2015-08-11 DIAGNOSIS — F419 Anxiety disorder, unspecified: Secondary | ICD-10-CM | POA: Insufficient documentation

## 2015-08-11 DIAGNOSIS — Z7984 Long term (current) use of oral hypoglycemic drugs: Secondary | ICD-10-CM | POA: Diagnosis not present

## 2015-08-11 DIAGNOSIS — K828 Other specified diseases of gallbladder: Secondary | ICD-10-CM | POA: Diagnosis not present

## 2015-08-11 DIAGNOSIS — Z79899 Other long term (current) drug therapy: Secondary | ICD-10-CM | POA: Diagnosis not present

## 2015-08-11 DIAGNOSIS — G894 Chronic pain syndrome: Secondary | ICD-10-CM | POA: Insufficient documentation

## 2015-08-11 DIAGNOSIS — R69 Illness, unspecified: Secondary | ICD-10-CM | POA: Diagnosis not present

## 2015-08-11 DIAGNOSIS — N189 Chronic kidney disease, unspecified: Secondary | ICD-10-CM | POA: Insufficient documentation

## 2015-08-11 DIAGNOSIS — M109 Gout, unspecified: Secondary | ICD-10-CM | POA: Insufficient documentation

## 2015-08-11 DIAGNOSIS — F329 Major depressive disorder, single episode, unspecified: Secondary | ICD-10-CM | POA: Diagnosis not present

## 2015-08-11 DIAGNOSIS — K219 Gastro-esophageal reflux disease without esophagitis: Secondary | ICD-10-CM | POA: Insufficient documentation

## 2015-08-11 DIAGNOSIS — E1122 Type 2 diabetes mellitus with diabetic chronic kidney disease: Secondary | ICD-10-CM | POA: Diagnosis not present

## 2015-08-11 DIAGNOSIS — K660 Peritoneal adhesions (postprocedural) (postinfection): Secondary | ICD-10-CM | POA: Diagnosis not present

## 2015-08-11 DIAGNOSIS — Z9049 Acquired absence of other specified parts of digestive tract: Secondary | ICD-10-CM | POA: Insufficient documentation

## 2015-08-11 DIAGNOSIS — R109 Unspecified abdominal pain: Secondary | ICD-10-CM | POA: Diagnosis present

## 2015-08-11 HISTORY — PX: CHOLECYSTECTOMY: SHX55

## 2015-08-11 LAB — GLUCOSE, CAPILLARY
GLUCOSE-CAPILLARY: 133 mg/dL — AB (ref 65–99)
Glucose-Capillary: 159 mg/dL — ABNORMAL HIGH (ref 65–99)

## 2015-08-11 SURGERY — LAPAROSCOPIC CHOLECYSTECTOMY
Anesthesia: General | Site: Abdomen

## 2015-08-11 MED ORDER — OXYCODONE-ACETAMINOPHEN 5-325 MG PO TABS: 1.0000 | ORAL_TABLET | ORAL | Status: DC | PRN

## 2015-08-11 MED ORDER — ROCURONIUM BROMIDE 100 MG/10ML IV SOLN
INTRAVENOUS | Status: AC
  Filled 2015-08-11: qty 1

## 2015-08-11 MED ORDER — OXYCODONE-ACETAMINOPHEN 5-325 MG PO TABS
1.0000 | ORAL_TABLET | ORAL | Status: DC | PRN
  Administered 2015-08-11: 1 via ORAL
  Filled 2015-08-11: qty 1

## 2015-08-11 MED ORDER — LIDOCAINE HCL (CARDIAC) 20 MG/ML IV SOLN
INTRAVENOUS | Status: AC
  Filled 2015-08-11: qty 5

## 2015-08-11 MED ORDER — MIDAZOLAM HCL 2 MG/2ML IJ SOLN
INTRAMUSCULAR | Status: AC
  Filled 2015-08-11: qty 2

## 2015-08-11 MED ORDER — LACTATED RINGERS IV SOLN
INTRAVENOUS | Status: DC
  Administered 2015-08-11 (×2): 1000 mL via INTRAVENOUS

## 2015-08-11 MED ORDER — ONDANSETRON HCL 4 MG/2ML IJ SOLN
INTRAMUSCULAR | Status: AC
  Filled 2015-08-11: qty 2

## 2015-08-11 MED ORDER — CEFOTETAN DISODIUM-DEXTROSE 2-2.08 GM-% IV SOLR
2.0000 g | INTRAVENOUS | Status: AC
  Administered 2015-08-11: 2 g via INTRAVENOUS

## 2015-08-11 MED ORDER — LACTATED RINGERS IV SOLN: INTRAVENOUS | Status: DC

## 2015-08-11 MED ORDER — SUGAMMADEX SODIUM 200 MG/2ML IV SOLN
INTRAVENOUS | Status: AC
  Filled 2015-08-11: qty 2

## 2015-08-11 MED ORDER — FENTANYL CITRATE (PF) 100 MCG/2ML IJ SOLN
INTRAMUSCULAR | Status: DC | PRN
  Administered 2015-08-11 (×5): 50 ug via INTRAVENOUS

## 2015-08-11 MED ORDER — PROPOFOL 10 MG/ML IV BOLUS
INTRAVENOUS | Status: DC | PRN
  Administered 2015-08-11: 150 mg via INTRAVENOUS
  Administered 2015-08-11: 50 mg via INTRAVENOUS

## 2015-08-11 MED ORDER — FENTANYL CITRATE (PF) 250 MCG/5ML IJ SOLN
INTRAMUSCULAR | Status: AC
  Filled 2015-08-11: qty 5

## 2015-08-11 MED ORDER — SUGAMMADEX SODIUM 200 MG/2ML IV SOLN
INTRAVENOUS | Status: DC | PRN
  Administered 2015-08-11: 160 mg via INTRAVENOUS

## 2015-08-11 MED ORDER — IOPAMIDOL (ISOVUE-300) INJECTION 61%
INTRAVENOUS | Status: AC
Start: 2015-08-11 — End: 2015-08-11
  Filled 2015-08-11: qty 50

## 2015-08-11 MED ORDER — HYDROMORPHONE HCL 1 MG/ML IJ SOLN
INTRAMUSCULAR | Status: AC
  Filled 2015-08-11: qty 1

## 2015-08-11 MED ORDER — BUPIVACAINE-EPINEPHRINE 0.25% -1:200000 IJ SOLN
INTRAMUSCULAR | Status: AC
  Filled 2015-08-11: qty 1

## 2015-08-11 MED ORDER — ONDANSETRON HCL 4 MG/2ML IJ SOLN
INTRAMUSCULAR | Status: DC | PRN
Start: 2015-08-11 — End: 2015-08-11
  Administered 2015-08-11: 4 mg via INTRAVENOUS

## 2015-08-11 MED ORDER — SUCCINYLCHOLINE CHLORIDE 20 MG/ML IJ SOLN
INTRAMUSCULAR | Status: DC | PRN
  Administered 2015-08-11: 100 mg via INTRAVENOUS

## 2015-08-11 MED ORDER — HEPARIN SODIUM (PORCINE) 5000 UNIT/ML IJ SOLN
5000.0000 [IU] | Freq: Once | INTRAMUSCULAR | Status: AC
  Administered 2015-08-11: 5000 [IU] via SUBCUTANEOUS
  Filled 2015-08-11: qty 1

## 2015-08-11 MED ORDER — ROCURONIUM BROMIDE 100 MG/10ML IV SOLN
INTRAVENOUS | Status: DC | PRN
  Administered 2015-08-11: 30 mg via INTRAVENOUS

## 2015-08-11 MED ORDER — HYDROMORPHONE HCL 1 MG/ML IJ SOLN
0.2500 mg | INTRAMUSCULAR | Status: DC | PRN
  Administered 2015-08-11 (×2): 0.5 mg via INTRAVENOUS

## 2015-08-11 MED ORDER — BUPIVACAINE-EPINEPHRINE 0.25% -1:200000 IJ SOLN
INTRAMUSCULAR | Status: DC | PRN
  Administered 2015-08-11: 30 mL

## 2015-08-11 MED ORDER — MIDAZOLAM HCL 5 MG/5ML IJ SOLN
INTRAMUSCULAR | Status: DC | PRN
  Administered 2015-08-11: 2 mg via INTRAVENOUS

## 2015-08-11 MED ORDER — CEFOTETAN DISODIUM-DEXTROSE 2-2.08 GM-% IV SOLR
INTRAVENOUS | Status: AC
  Filled 2015-08-11: qty 50

## 2015-08-11 MED ORDER — PROPOFOL 10 MG/ML IV BOLUS
INTRAVENOUS | Status: AC
  Filled 2015-08-11: qty 20

## 2015-08-11 MED ORDER — LIDOCAINE HCL (CARDIAC) 20 MG/ML IV SOLN
INTRAVENOUS | Status: DC | PRN
  Administered 2015-08-11: 60 mg via INTRAVENOUS

## 2015-08-11 SURGICAL SUPPLY — 42 items
APPLIER CLIP ROT 10 11.4 M/L (STAPLE)
APR CLP MED LRG 11.4X10 (STAPLE)
CABLE HIGH FREQUENCY MONO STRZ (ELECTRODE) ×2 IMPLANT
CATH CHOLANG 76X19 KUMAR (CATHETERS) ×2 IMPLANT
CHLORAPREP W/TINT 26ML (MISCELLANEOUS) ×2 IMPLANT
CLIP APPLIE ROT 10 11.4 M/L (STAPLE) IMPLANT
CLIP LIGATING HEM O LOK PURPLE (MISCELLANEOUS) ×4 IMPLANT
CLIP LIGATING HEMO LOK XL GOLD (MISCELLANEOUS) IMPLANT
COVER MAYO STAND STRL (DRAPES) IMPLANT
COVER SURGICAL LIGHT HANDLE (MISCELLANEOUS) ×2 IMPLANT
CUTTER FLEX LINEAR 45M (STAPLE) IMPLANT
DECANTER SPIKE VIAL GLASS SM (MISCELLANEOUS) ×2 IMPLANT
DEVICE PMI PUNCTURE CLOSURE (MISCELLANEOUS) ×2 IMPLANT
DRAIN CHANNEL 19F RND (DRAIN) IMPLANT
DRAPE C-ARM 42X120 X-RAY (DRAPES) IMPLANT
DRAPE LAPAROSCOPIC ABDOMINAL (DRAPES) ×2 IMPLANT
EVACUATOR SILICONE 100CC (DRAIN) IMPLANT
GLOVE BIOGEL PI IND STRL 7.0 (GLOVE) ×1 IMPLANT
GLOVE BIOGEL PI INDICATOR 7.0 (GLOVE) ×1
GLOVE SURG SS PI 7.0 STRL IVOR (GLOVE) ×2 IMPLANT
GOWN STRL REUS W/TWL LRG LVL3 (GOWN DISPOSABLE) ×2 IMPLANT
GOWN STRL REUS W/TWL XL LVL3 (GOWN DISPOSABLE) ×4 IMPLANT
KIT BASIN OR (CUSTOM PROCEDURE TRAY) ×2 IMPLANT
LIQUID BAND (GAUZE/BANDAGES/DRESSINGS) ×2 IMPLANT
PAD POSITIONING PINK XL (MISCELLANEOUS) IMPLANT
POUCH RETRIEVAL ECOSAC 10 (ENDOMECHANICALS) ×1 IMPLANT
POUCH RETRIEVAL ECOSAC 10MM (ENDOMECHANICALS) ×1
RELOAD 45 VASCULAR/THIN (ENDOMECHANICALS) IMPLANT
RELOAD STAPLE TA45 3.5 REG BLU (ENDOMECHANICALS) IMPLANT
SCISSORS LAP 5X35 DISP (ENDOMECHANICALS) ×2 IMPLANT
SET IRRIG TUBING LAPAROSCOPIC (IRRIGATION / IRRIGATOR) IMPLANT
SHEARS HARMONIC ACE PLUS 36CM (ENDOMECHANICALS) IMPLANT
SLEEVE XCEL OPT CAN 5 100 (ENDOMECHANICALS) ×4 IMPLANT
STOPCOCK 4 WAY LG BORE MALE ST (IV SETS) ×2 IMPLANT
SUT ETHILON 2 0 PS N (SUTURE) IMPLANT
SUT MNCRL AB 4-0 PS2 18 (SUTURE) ×2 IMPLANT
SUT VICRYL 0 ENDOLOOP (SUTURE) IMPLANT
TOWEL OR 17X26 10 PK STRL BLUE (TOWEL DISPOSABLE) ×2 IMPLANT
TOWEL OR NON WOVEN STRL DISP B (DISPOSABLE) IMPLANT
TRAY LAPAROSCOPIC (CUSTOM PROCEDURE TRAY) ×2 IMPLANT
TROCAR BLADELESS OPT 5 100 (ENDOMECHANICALS) ×2 IMPLANT
TROCAR XCEL 12X100 BLDLESS (ENDOMECHANICALS) ×2 IMPLANT

## 2015-08-11 NOTE — Progress Notes (Signed)
SS notified pt will be retuning in 20 minutes.  Room 2 assigned.

## 2015-08-11 NOTE — Transfer of Care (Signed)
Immediate Anesthesia Transfer of Care Note  Patient: Dana Romero  Procedure(s) Performed: Procedure(s): LAPAROSCOPIC CHOLECYSTECTOMY (N/A)  Patient Location: PACU  Anesthesia Type:General  Level of Consciousness: awake, alert  and oriented  Airway & Oxygen Therapy: Patient Spontanous Breathing and Patient connected to face mask oxygen  Post-op Assessment: Report given to RN and Post -op Vital signs reviewed and stable  Post vital signs: Reviewed and stable  Last Vitals:  Filed Vitals:   08/11/15 0840  BP: 130/80  Pulse: 77  Temp: 36.4 C  Resp: 18    Complications: No apparent anesthesia complications

## 2015-08-11 NOTE — Anesthesia Postprocedure Evaluation (Signed)
Anesthesia Post Note  Patient: Dana Romero  Procedure(s) Performed: Procedure(s) (LRB): LAPAROSCOPIC CHOLECYSTECTOMY (N/A)  Patient location during evaluation: PACU Anesthesia Type: General Level of consciousness: awake and alert Pain management: pain level controlled Vital Signs Assessment: post-procedure vital signs reviewed and stable Respiratory status: spontaneous breathing, nonlabored ventilation, respiratory function stable and patient connected to nasal cannula oxygen Cardiovascular status: blood pressure returned to baseline and stable Postop Assessment: no signs of nausea or vomiting Anesthetic complications: no    Last Vitals:  Filed Vitals:   08/11/15 1145 08/11/15 1159  BP: 106/51 115/51  Pulse: 75 78  Temp:  36.6 C  Resp: 11 16    Last Pain:  Filed Vitals:   08/11/15 1222  PainSc: 5                  Denetta Fei L

## 2015-08-11 NOTE — Anesthesia Preprocedure Evaluation (Addendum)
Anesthesia Evaluation  Patient identified by MRN, date of birth, ID band Patient awake    Reviewed: Allergy & Precautions, H&P , NPO status , Patient's Chart, lab work & pertinent test results, reviewed documented beta blocker date and time   Airway Mallampati: II  TM Distance: >3 FB Neck ROM: full    Dental  (+) Dental Advisory Given, Edentulous Upper, Edentulous Lower   Pulmonary asthma ,    Pulmonary exam normal breath sounds clear to auscultation       Cardiovascular Exercise Tolerance: Good hypertension, Pt. on home beta blockers + angina at rest Normal cardiovascular exam Rhythm:regular Rate:Normal  CP with anxiety relieved with NTG   Neuro/Psych Anxiety Depression Peripheral neuropathy negative neurological ROS  negative psych ROS   GI/Hepatic negative GI ROS, Neg liver ROS, GERD  Medicated and Controlled,  Endo/Other  diabetes, Well Controlled, Type 2Diet controlled DM  Renal/GU Renal diseaseChronic kidney disease - mild. CRT 1.41  negative genitourinary   Musculoskeletal   Abdominal   Peds  Hematology negative hematology ROS (+)   Anesthesia Other Findings Chronic pain syndrome  Reproductive/Obstetrics negative OB ROS                           Anesthesia Physical Anesthesia Plan  ASA: III  Anesthesia Plan: General   Post-op Pain Management:    Induction: Intravenous  Airway Management Planned: Oral ETT  Additional Equipment:   Intra-op Plan:   Post-operative Plan: Extubation in OR  Informed Consent: I have reviewed the patients History and Physical, chart, labs and discussed the procedure including the risks, benefits and alternatives for the proposed anesthesia with the patient or authorized representative who has indicated his/her understanding and acceptance.   Dental Advisory Given  Plan Discussed with: CRNA and Surgeon  Anesthesia Plan Comments:          Anesthesia Quick Evaluation

## 2015-08-11 NOTE — Discharge Instructions (Signed)

## 2015-08-11 NOTE — Anesthesia Procedure Notes (Signed)
Procedure Name: Intubation Date/Time: 08/11/2015 10:14 AM Performed by: Jarvis NewcomerARMISTEAD, Evelyne Makepeace A Pre-anesthesia Checklist: Patient identified, Emergency Drugs available, Suction available and Patient being monitored Patient Re-evaluated:Patient Re-evaluated prior to inductionOxygen Delivery Method: Circle system utilized Preoxygenation: Pre-oxygenation with 100% oxygen Intubation Type: IV induction Ventilation: Mask ventilation without difficulty Laryngoscope Size: Miller and 3 Grade View: Grade I Tube type: Oral Tube size: 7.5 mm Number of attempts: 2 (Utilized Miller 2 to start. Unable to obtain view. Miller 3 utilized with a grade 1 view.) Airway Equipment and Method: Stylet Placement Confirmation: ETT inserted through vocal cords under direct vision,  positive ETCO2,  CO2 detector and breath sounds checked- equal and bilateral Secured at: 21 cm Tube secured with: Tape Difficulty Due To: Difficulty was unanticipated Comments: Patient larynx was deep and slightly anterior. Miller 3 blade able to acquire view of cords.

## 2015-08-11 NOTE — H&P (Addendum)
Dana Romero is an 65 y.o. female.   Chief Complaint: abdominal pain, nausea, vomiting HPI: The patient is a 65 year old female who presents for evaluation of gall stones. 65 year old female with a one-year history of nausea and vomiting intermittently. Vomiting is preceded by right upper quadrant pain. Vomiting is described as yellow brown. She denies fevers or chills. He also discusses regurgitation or reflux type symptoms. She has changed her diet to be very Bland without improvement. She had ultrasound performed showing no stones before she was exquisitely tender during the exam. She underwent HIDA with borderline values, gastric emptying study which was normal, and continue to have pain.  Past Medical History  Diagnosis Date  . Hypertension 1998  . Depression   . Sinusitis, chronic   . Anxiety   . Diabetes mellitus 1998    off meds since 2009 or 2010  . Degenerative disc disease, cervical     followed by [facility]; maintained on daily narcotics.  . Chronic pain syndrome     DDD cervical spine; managed by [facility].  Daily narcotics.  . Allergy     allergy eyes  . Asthma   . GERD (gastroesophageal reflux disease)   . Gout   . Anginal pain ([place])     chest pain -usually anxiety related- Nitrogycerin helpful.  . Chronic kidney disease     PER PT, Dr. Posey Pronto follows every 6 months  . Transfusion history     7 yrs ago - time of hospital visit Pancreatitis    Past Surgical History  Procedure Laterality Date  . Appendectomy    . Tonsillectomy    . Tubal ligation      post partum  . Tonsillectomy and adenoidectomy    . Breast surgery      breast cyst x2 -last '12  . Arm debridement Left     debridement of axilla "spider bite"    Family History  Problem Relation Age of Onset  . Hypertension Mother   . Depression Mother   . Diabetes Maternal Aunt   . Breast cancer Maternal Aunt   . Diabetes Maternal Uncle   . Hypertension Maternal Uncle   . Diabetes Maternal Grandmother    . Hypertension Maternal Grandmother   . Breast cancer Maternal Grandmother   . Heart attack Maternal Grandmother   . Hypertension Maternal Uncle   . Hypertension Brother   . Stroke Other    Social History:  reports that she has never smoked. She has never used smokeless tobacco. She reports that she does not drink alcohol or use illicit drugs.  Allergies:  Allergies  Allergen Reactions  . Amlodipine Besylate     REACTION: intolerance  . Atorvastatin     REACTION: intolerance  . Ezetimibe-Simvastatin     REACTION: intolerance  . Losartan Potassium-Hctz     Pt is unsure   . Morphine And Related Other (See Comments)    MIGRAINE  . Niacin     REACTION: intolerance  . Rosuvastatin     REACTION: intolerance  . Toprol Xl [Metoprolol Succinate] Other (See Comments)    Makes her feel weak  . Valium [Diazepam]     Has reverse effect on patient   . Latex Rash    Facility-administered medications prior to admission  Medication Dose Route Frequency Provider Last Rate Last Dose  . albuterol (PROVENTIL) (2.5 MG/3ML) 0.083% nebulizer solution 2.5 mg  2.5 mg Nebulization Once Wardell Honour, MD      . ipratropium (  ATROVENT) nebulizer solution 0.5 mg  0.5 mg Nebulization Once Wardell Honour, MD       Medications Prior to Admission  Medication Sig Dispense Refill  . albuterol (PROVENTIL HFA;VENTOLIN HFA) 108 (90 BASE) MCG/ACT inhaler Inhale 1-2 puffs into the lungs every 6 (six) hours as needed for wheezing or shortness of breath (or persistent coughing). 1 Inhaler 2  . albuterol (PROVENTIL) (2.5 MG/3ML) 0.083% nebulizer solution Take 3 mLs (2.5 mg total) by nebulization every 6 (six) hours as needed for wheezing or shortness of breath. 75 mL 0  . Alcohol Swabs PADS Use to check blood sugar daily. E11.9 100 each 3  . ALPRAZolam (XANAX) 1 MG tablet Take 1 tablet (1 mg total) by mouth 2 (two) times daily as needed for anxiety. 60 tablet 0  . amitriptyline (ELAVIL) 25 MG tablet TAKE ONE  TABLET BY MOUTH AT BEDTIME 90 tablet 0  . blood glucose meter kit and supplies KIT Dispense based on patient and insurance preference. Use once a day.  DX: E11.9. 1 each 0  . blood glucose meter kit and supplies Dispense based on patient and insurance preference. Use up to four times daily as directed. (FOR ICD-10 E11.9). 1 each 0  . Blood Glucose Monitoring Suppl (FREESTYLE LITE) DEVI     . budesonide-formoterol (SYMBICORT) 80-4.5 MCG/ACT inhaler Inhale 2 puffs into the lungs 2 (two) times daily. 1 Inhaler 3  . carvedilol (COREG) 25 MG tablet TAKE ONE TABLET BY MOUTH TWICE DAILY WITH A MEAL 180 tablet 0  . Cinnamon 500 MG capsule Take 1,000 mg by mouth daily.    . diphenhydrAMINE (BENADRYL) 25 mg capsule Take 25 mg by mouth at bedtime.    Marland Kitchen EPINEPHrine (EPIPEN 2-PAK) 0.3 mg/0.3 mL IJ SOAJ injection Inject 0.3 mLs (0.3 mg total) into the muscle once. 1 Device 1  . esomeprazole (NEXIUM) 40 MG capsule Take 1 capsule (40 mg total) by mouth daily before breakfast. 90 capsule 3  . estradiol (VIVELLE-DOT) 0.075 MG/24HR Place 1 patch onto the skin 2 (two) times a week. 8 patch 5  . fexofenadine-pseudoephedrine (ALLEGRA-D) 60-120 MG per tablet Take 1 tablet by mouth daily. (Patient taking differently: Take 1 tablet by mouth daily as needed (allergies). ) 30 tablet 5  . FLUoxetine (PROZAC) 20 MG tablet Take 3 tablets (60 mg total) by mouth daily. 90 tablet 5  . glucose blood test strip Test blood sugar daily.  DX: E11.9 100 each 2  . glucose monitoring kit (FREESTYLE) monitoring kit Test blood sugar daily. DX: E11.9 1 each 0  . HYDROcodone-acetaminophen (NORCO) 10-325 MG per tablet Take 1 tablet by mouth every 6 (six) hours as needed for moderate pain or severe pain.     Marland Kitchen ketotifen (ALAWAY) 0.025 % ophthalmic solution Place 1 drop into both eyes 2 (two) times daily as needed (Allergies).    Elmore Guise Devices (LANCING DEVICE) MISC Use to test blood sugar daily. E11.9 1 each 0  . Lancets MISC Test blood sugar  daily. DX: E11.9 100 each 2  . losartan (COZAAR) 100 MG tablet Take 1 tablet (100 mg total) by mouth daily. 90 tablet 1  . magnesium oxide (MAG-OX) 400 MG tablet Take 1 tablet (400 mg total) by mouth 2 (two) times daily. (Patient taking differently: Take 400 mg by mouth 3 (three) times daily. ) 60 tablet 0  . medroxyPROGESTERone (PROVERA) 2.5 MG tablet TAKE ONE TABLET BY MOUTH ONCE DAILY 90 tablet 0  . Multiple Vitamins-Minerals (MULTIVITAMIN ADULT PO)  Take 1 tablet by mouth daily.    Marland Kitchen OVER THE COUNTER MEDICATION Take 1 tablet by mouth daily. Focus Factor: Brain Function Support    . promethazine (PHENERGAN) 12.5 MG tablet TAKE ONE TABLET BY MOUTH EVERY 8 HOURS AS NEEDED (Patient taking differently: TAKE ONE TABLET BY MOUTH EVERY 8 HOURS AS NEEDED FOR NAUSEA) 30 tablet 0  . Propylene Glycol-Glycerin (SOOTHE OP) Apply 1 drop to eye daily.    . temazepam (RESTORIL) 30 MG capsule TAKE ONE CAPSULE BY MOUTH AT BEDTIME AS NEEDED FOR SLEEP 30 capsule 0  . triamcinolone (NASACORT) 55 MCG/ACT nasal inhaler Place 2 sprays into the nose daily. (Patient taking differently: Place 2 sprays into the nose daily as needed (allergies). ) 1 Inhaler 5  . vitamin C (ASCORBIC ACID) 500 MG tablet Take 500 mg by mouth daily.    . vitamin E 400 UNIT capsule Take 400 Units by mouth daily.    . colchicine 0.6 MG tablet Take 0.6 mg by mouth daily as needed (gout flare up).     . methocarbamol (ROBAXIN) 500 MG tablet TAKE ONE TABLET BY MOUTH EVERY 8 HOURS AS NEEDED (Patient not taking: Reported on 08/04/2015) 90 tablet 1  . omega-3 acid ethyl esters (LOVAZA) 1 g capsule TAKE TWO CAPSULES BY MOUTH TWICE DAILY 360 capsule 0  . sitaGLIPtin (JANUVIA) 50 MG tablet Take 1 tablet (50 mg total) by mouth daily. 90 tablet 3    Results for orders placed or performed during the hospital encounter of 08/11/15 (from the past 48 hour(s))  Glucose, capillary     Status: Abnormal   Collection Time: 08/11/15  8:39 AM  Result Value Ref Range    Glucose-Capillary 133 (H) 65 - 99 mg/dL   Comment 1 Notify RN    No results found.  Review of Systems  Constitutional: Negative for fever and chills.  HENT: Negative for hearing loss.   Eyes: Negative for blurred vision and double vision.  Respiratory: Negative for cough and hemoptysis.   Cardiovascular: Negative for chest pain and palpitations.  Gastrointestinal: Positive for nausea, vomiting and abdominal pain.  Genitourinary: Negative for dysuria and urgency.  Musculoskeletal: Negative for myalgias and neck pain.  Skin: Negative for itching and rash.  Neurological: Negative for dizziness, tingling and headaches.  Endo/Heme/Allergies: Does not bruise/bleed easily.  Psychiatric/Behavioral: Negative for depression and suicidal ideas.    Blood pressure 130/80, pulse 77, temperature 97.5 F (36.4 C), temperature source Oral, resp. rate 18, height 5' 4"  (1.626 m), weight 81.194 kg (179 lb), last menstrual period 03/15/2014, SpO2 96 %. Physical Exam  Vitals reviewed. Constitutional: She is oriented to person, place, and time. She appears well-developed and well-nourished.  HENT:  Head: Normocephalic and atraumatic.  Eyes: Conjunctivae and EOM are normal. Pupils are equal, round, and reactive to light.  Neck: Normal range of motion. Neck supple.  Cardiovascular: Normal rate and regular rhythm.   Respiratory: Effort normal and breath sounds normal.  GI: Soft. Bowel sounds are normal. She exhibits no distension. There is tenderness.  Musculoskeletal: Normal range of motion.  Neurological: She is alert and oriented to person, place, and time.  Skin: Skin is warm and dry.  Psychiatric: She has a normal mood and affect. Her behavior is normal.     Assessment/Plan 65 yo female with chronic nausea, vomiting, abdominal pain and borderline HIDA. We discussed the etiology of symptoms, that they could be related to IBS/constipation as well as biliary issues.Due to the otherwise negative  work  up we have decided to proceed with lap chole as well as assess the rest of the abdomen -lap chole  Mickeal Skinner, MD 08/11/2015, 9:58 AM

## 2015-08-11 NOTE — Op Note (Signed)
Preoperative diagnosis: biliary dyskinesia  Postoperative diagnosis: Same   Procedure: laparoscopic cholecystectomy  Surgeon: Feliciana RossettiLuke Cj Beecher, M.D.  Asst: none  Anesthesia: Gen.   Indications for procedure: [name] is a 65 y.o. female with symptoms of Abdominal pain, Nausea and Vomiting consistent with gallbladder disease, HIDA was marginal. No other pathology was found in further work up with imaging and gastric emptying study.  Description of procedure: The patient was brought into the operative suite, placed supine. Anesthesia was administered with endotracheal tube. Patient was strapped in place and foot board was secured. All pressure points were offloaded by foam padding. The patient was prepped and draped in the usual sterile fashion.  A small incision was made to the right of the umbilicus. A 5mm trocar was inserted into the peritoneal cavity with optical entry. Pneumoperitoneum was applied with high flow low pressure. 2 5mm trocars were placed in the RUQ. A 12mm trocar was placed in the subxiphoid space. All trocars sites were first anesthesized with 0.25% marcaine with epinephrine in the subcutaneous and preperitoneal layers. Next the patient was placed in reverse trendelenberg. The abdomen was inspected and there were a few filmy adhesions to the lower abdomen which were sharply taken down.  The gallbladder was retracted cephalad and lateral. The peritoneum was reflected off the infundibulum working lateral to medial. The cystic duct and cystic artery were identified and further dissection revealed a critical view.  The cystic duct and cystic artery were doubly clipped and ligated.   The gallbladder was removed off the liver bed with cautery. The Gallbladder was placed in a specimen bag. The gallbladder fossa was irrigated and hemostasis was applied with cautery. The gallbladder was removed via the 12mm trocar. No dilation was made and no fascia closure was used. Pneumoperitoneum  was removed, all trocar were removed. All incisions were closed with 4-0 monocryl subcuticular stitch. The patient woke from anesthesia and was brought to PACU in stable condition.  Findings: normal anatomy of critical view, filmy adhesions of omentum to abdominal wall  Specimen: gallbldadder  Blood loss: <4050ml  Local anesthesia: 20ml 0.25% marcaine w epi  Complications: none  Feliciana RossettiLuke Arjan Strohm, M.D. General, Bariatric, & Minimally Invasive Surgery [facility]

## 2015-08-27 DIAGNOSIS — J4522 Mild intermittent asthma with status asthmaticus: Secondary | ICD-10-CM | POA: Diagnosis not present

## 2015-08-28 ENCOUNTER — Other Ambulatory Visit: Payer: Self-pay | Admitting: Family Medicine

## 2015-08-29 NOTE — Telephone Encounter (Signed)
Please call in refill of Xanax as approved. 

## 2015-08-30 ENCOUNTER — Other Ambulatory Visit: Payer: Self-pay | Admitting: Family Medicine

## 2015-08-30 NOTE — Telephone Encounter (Signed)
Called in.

## 2015-09-02 ENCOUNTER — Other Ambulatory Visit: Payer: Self-pay | Admitting: Family Medicine

## 2015-09-02 ENCOUNTER — Other Ambulatory Visit: Payer: Self-pay | Admitting: Physician Assistant

## 2015-09-03 ENCOUNTER — Ambulatory Visit (INDEPENDENT_AMBULATORY_CARE_PROVIDER_SITE_OTHER): Payer: Medicare HMO | Admitting: Physician Assistant

## 2015-09-03 VITALS — BP 124/72 | HR 88 | Temp 99.1°F | Resp 17 | Ht 64.0 in | Wt 176.0 lb

## 2015-09-03 DIAGNOSIS — J019 Acute sinusitis, unspecified: Secondary | ICD-10-CM | POA: Diagnosis not present

## 2015-09-03 DIAGNOSIS — R509 Fever, unspecified: Secondary | ICD-10-CM

## 2015-09-03 LAB — POCT CBC
Granulocyte percent: 71.2 %G (ref 37–80)
HCT, POC: 40 % (ref 37.7–47.9)
Hemoglobin: 14 g/dL (ref 12.2–16.2)
Lymph, poc: 2.4 (ref 0.6–3.4)
MCH: 27.6 pg (ref 27–31.2)
MCHC: 35.2 g/dL (ref 31.8–35.4)
MCV: 78.4 fL — AB (ref 80–97)
MID (CBC): 0.7 (ref 0–0.9)
MPV: 7.4 fL (ref 0–99.8)
PLATELET COUNT, POC: 364 10*3/uL (ref 142–424)
POC Granulocyte: 7.6 — AB (ref 2–6.9)
POC LYMPH PERCENT: 22.1 %L (ref 10–50)
POC MID %: 6.7 % (ref 0–12)
RBC: 5.1 M/uL (ref 4.04–5.48)
RDW, POC: 14.2 %
WBC: 10.7 10*3/uL — AB (ref 4.6–10.2)

## 2015-09-03 MED ORDER — AMOXICILLIN-POT CLAVULANATE 875-125 MG PO TABS: 1.0000 | ORAL_TABLET | Freq: Two times a day (BID) | ORAL | Status: AC

## 2015-09-03 MED ORDER — GUAIFENESIN ER 1200 MG PO TB12: 1.0000 | ORAL_TABLET | Freq: Two times a day (BID) | ORAL | Status: DC | PRN

## 2015-09-03 NOTE — Progress Notes (Signed)
[facility] [address], [place] [address] [phone]  Date:  09/03/2015   Name:  Dana Romero   DOB:  Jul 25, 1950   MRN:  [id number]  PCP:  Reginia Forts, MD    Chief Complaint: Otalgia; Headache; and Sore Throat   History of Present Illness:  This is a 65 y.o. female with PMH allergic rhinitis, asthma, HTN, DM, HLD, CKD, OA, hx pancreatitis who is presenting with otalgia, sore throat and headache x 3 days. Mild cough, productive of green sputum. Nasal drainage is green as well. Vomited 4 times today, states vomit was green, no blood.. Checked her temp and was 101.5. Temp here 99.1. Having some wheezing. No SOB. She has been chilled and she is having body aches. She states this happens to her 1-2 times a year and usually turns into bronchitis and asthma exacerbation.   Aggravating/alleviating factors: Using some peroxide in her ear. Did a breathing treatment with nebulizer yesterday. History of asthma: yes, uses symbicort BID.  History of env allergies: yes, takes allegra-D daily. Tobacco use: no  Review of Systems:  Review of Systems See HPI  Patient Active Problem List   Diagnosis Date Noted  . Allergic rhinitis 05/09/2012  . Caregiver stress 07/04/2011  . Dental caries 11/22/2007  . Essential hypertension 07/26/2007  . FATIGUE 07/26/2007  . DM II (diabetes mellitus, type II), controlled ([place]) 11/20/2006    Class: History of  . HYPERLIPIDEMIA 11/20/2006  . ANXIETY STATE NOS 11/20/2006  . Chronic kidney disease 11/20/2006  . 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 DIABETIC PERIPHERAL NEUROPATHY 02/21/2006  . PANCREATITIS, HX OF 06/08/2005    Prior to Admission medications   Medication Sig Start Date End Date Taking? Authorizing Provider  albuterol (PROVENTIL HFA;VENTOLIN HFA) 108 (90 BASE) MCG/ACT inhaler Inhale 1-2 puffs into the lungs every 6 (six) hours as needed for wheezing or shortness of breath (or persistent coughing). 12/16/14   Yes Mancel Bale, PA-C  albuterol (PROVENTIL) (2.5 MG/3ML) 0.083% nebulizer solution Take 3 mLs (2.5 mg total) by nebulization every 6 (six) hours as needed for wheezing or shortness of breath. 01/07/15  Yes Wardell Honour, MD  Alcohol Swabs PADS Use to check blood sugar daily. E11.9 04/07/15  Yes Wardell Honour, MD  ALPRAZolam Duanne Moron) 1 MG tablet TAKE ONE TABLET BY MOUTH TWICE DAILY AS NEEDED FOR ANXIETY 08/29/15  Yes Wardell Honour, MD  amitriptyline (ELAVIL) 25 MG tablet TAKE ONE TABLET BY MOUTH AT BEDTIME 06/07/15  Yes Wardell Honour, MD  blood glucose meter kit and supplies KIT Dispense based on patient and insurance preference. Use once a day.  DX: E11.9. 12/31/14  Yes Wardell Honour, MD  blood glucose meter kit and supplies Dispense based on patient and insurance preference. Use up to four times daily as directed. (FOR ICD-10 E11.9). 03/31/15  Yes Wardell Honour, MD  Blood Glucose Monitoring Suppl (FREESTYLE LITE) DEVI  01/12/15  Yes Historical Provider, MD  budesonide-formoterol (SYMBICORT) 80-4.5 MCG/ACT inhaler Inhale 2 puffs into the lungs 2 (two) times daily. 04/12/15  Yes Wardell Honour, MD  carvedilol (COREG) 25 MG tablet TAKE ONE TABLET BY MOUTH TWICE DAILY WITH A MEAL 06/07/15  Yes Wardell Honour, MD  Cinnamon 500 MG capsule Take 1,000 mg by mouth daily.   Yes Historical Provider, MD  colchicine 0.6 MG tablet Take 0.6 mg by mouth daily as needed (gout flare up).    Yes Historical Provider, MD  diphenhydrAMINE (  BENADRYL) 25 mg capsule Take 25 mg by mouth at bedtime.   Yes Historical Provider, MD  EPINEPHrine (EPIPEN 2-PAK) 0.3 mg/0.3 mL IJ SOAJ injection Inject 0.3 mLs (0.3 mg total) into the muscle once. 01/07/15  Yes Wardell Honour, MD  esomeprazole (NEXIUM) 40 MG capsule Take 1 capsule (40 mg total) by mouth daily before breakfast. 01/07/15  Yes Wardell Honour, MD  estradiol (VIVELLE-DOT) 0.075 MG/24HR Place 1 patch onto the skin 2 (two) times a week. 05/26/15  Yes Wardell Honour, MD   fexofenadine-pseudoephedrine (ALLEGRA-D) 60-120 MG per tablet Take 1 tablet by mouth daily. Patient taking differently: Take 1 tablet by mouth daily as needed (allergies).  12/17/12  Yes Barton Fanny, MD  FLUoxetine (PROZAC) 20 MG tablet Take 3 tablets (60 mg total) by mouth daily. 04/12/15  Yes Wardell Honour, MD  glucose blood test strip Test blood sugar daily.  DX: E11.9 04/06/15  Yes Wardell Honour, MD  glucose monitoring kit (FREESTYLE) monitoring kit Test blood sugar daily. DX: E11.9 01/12/15  Yes Wardell Honour, MD  HYDROcodone-acetaminophen (NORCO) 10-325 MG per tablet Take 1 tablet by mouth every 6 (six) hours as needed for moderate pain or severe pain.    Yes Historical Provider, MD  ketotifen (ALAWAY) 0.025 % ophthalmic solution Place 1 drop into both eyes 2 (two) times daily as needed (Allergies).   Yes Historical Provider, MD  Lancet Devices (LANCING DEVICE) MISC Use to test blood sugar daily. E11.9 04/07/15  Yes Wardell Honour, MD  Lancets MISC Test blood sugar daily. DX: E11.9 04/06/15  Yes Wardell Honour, MD  losartan (COZAAR) 100 MG tablet Take 1 tablet (100 mg total) by mouth daily. 12/11/14  Yes Wardell Honour, MD  magnesium oxide (MAG-OX) 400 MG tablet Take 1 tablet (400 mg total) by mouth 2 (two) times daily. Patient taking differently: Take 400 mg by mouth 3 (three) times daily.  07/21/12  Yes Barton Fanny, MD  medroxyPROGESTERone (PROVERA) 2.5 MG tablet TAKE ONE TABLET BY MOUTH ONCE DAILY 06/07/15  Yes Wardell Honour, MD  Multiple Vitamins-Minerals (MULTIVITAMIN ADULT PO) Take 1 tablet by mouth daily.   Yes Historical Provider, MD  omega-3 acid ethyl esters (LOVAZA) 1 g capsule TAKE TWO CAPSULES BY MOUTH TWICE DAILY 08/05/15  Yes Wardell Honour, MD  OVER THE COUNTER MEDICATION Take 1 tablet by mouth daily. Focus Factor: Brain Function Support   Yes Historical Provider, MD  promethazine (PHENERGAN) 12.5 MG tablet TAKE ONE TABLET BY MOUTH EVERY 8 HOURS AS NEEDED 08/31/15   Yes Wardell Honour, MD  Propylene Glycol-Glycerin (SOOTHE OP) Apply 1 drop to eye daily.   Yes Historical Provider, MD  sitaGLIPtin (JANUVIA) 50 MG tablet Take 1 tablet (50 mg total) by mouth daily. 05/11/15  Yes Wardell Honour, MD  temazepam (RESTORIL) 30 MG capsule TAKE ONE CAPSULE BY MOUTH AT BEDTIME AS NEEDED FOR SLEEP 07/30/15  Yes Wardell Honour, MD  thiamine (VITAMIN B-1) 100 MG tablet Take 100 mg by mouth daily.   Yes Historical Provider, MD  triamcinolone (NASACORT) 55 MCG/ACT nasal inhaler Place 2 sprays into the nose daily. Patient taking differently: Place 2 sprays into the nose daily as needed (allergies).  09/03/12  Yes Barton Fanny, MD  vitamin C (ASCORBIC ACID) 500 MG tablet Take 500 mg by mouth daily.   Yes Historical Provider, MD  Vitamin D, Ergocalciferol, (DRISDOL) 50000 units CAPS capsule Take 50,000 Units by mouth every 7 (  seven) days.   Yes Historical Provider, MD  vitamin E 400 UNIT capsule Take 400 Units by mouth daily.   Yes Historical Provider, MD  methocarbamol (ROBAXIN) 500 MG tablet TAKE ONE TABLET BY MOUTH EVERY 8 HOURS AS NEEDED Patient not taking: Reported on 08/04/2015 12/11/14   Wardell Honour, MD  oxyCODONE-acetaminophen (ROXICET) 5-325 MG tablet Take 1 tablet by mouth every 4 (four) hours as needed for severe pain. Patient not taking: Reported on 09/03/2015 08/11/15   Mickeal Skinner, MD    Allergies  Allergen Reactions  . Amlodipine Besylate     REACTION: intolerance  . Atorvastatin     REACTION: intolerance  . Ezetimibe-Simvastatin     REACTION: intolerance  . Losartan Potassium-Hctz     Pt is unsure   . Morphine And Related Other (See Comments)    MIGRAINE  . Niacin     REACTION: intolerance  . Rosuvastatin     REACTION: intolerance  . Toprol Xl [Metoprolol Succinate] Other (See Comments)    Makes her feel weak  . Valium [Diazepam]     Has reverse effect on patient   . Latex Rash    Past Surgical History  Procedure Laterality Date  .  Appendectomy    . Tonsillectomy    . Tubal ligation      post partum  . Tonsillectomy and adenoidectomy    . Breast surgery      breast cyst x2 -last '12  . Arm debridement Left     debridement of axilla "spider bite"  . Cholecystectomy N/A 08/11/2015    Procedure: LAPAROSCOPIC CHOLECYSTECTOMY;  Surgeon: Arta Bruce Kinsinger, MD;  Location: [facility];  Service: General;  Laterality: N/A;    Social History  Substance Use Topics  . Smoking status: Never Smoker   . Smokeless tobacco: Never Used  . Alcohol Use: No     Comment: none in many years -moderate use previous    Family History  Problem Relation Age of Onset  . Hypertension Mother   . Depression Mother   . Diabetes Maternal Aunt   . Breast cancer Maternal Aunt   . Diabetes Maternal Uncle   . Hypertension Maternal Uncle   . Diabetes Maternal Grandmother   . Hypertension Maternal Grandmother   . Breast cancer Maternal Grandmother   . Heart attack Maternal Grandmother   . Hypertension Maternal Uncle   . Hypertension Brother   . Stroke Other     Medication list has been reviewed and updated.  Physical Examination:  Physical Exam  Constitutional: She is oriented to person, place, and time. She appears well-developed and well-nourished. No distress.  HENT:  Head: Normocephalic and atraumatic.  Right Ear: Hearing, external ear and ear canal normal. Tympanic membrane is retracted.  Left Ear: Hearing, external ear and ear canal normal. Tympanic membrane is retracted.  Nose: Mucosal edema (pale) present. Right sinus exhibits maxillary sinus tenderness and frontal sinus tenderness. Left sinus exhibits maxillary sinus tenderness and frontal sinus tenderness.  Mouth/Throat: Uvula is midline, oropharynx is clear and moist and mucous membranes are normal.  pansinus tenderness, most exquisitely tender over right maxillary  Eyes: Conjunctivae and lids are normal. Right eye exhibits no discharge. Left eye exhibits no discharge. No  scleral icterus.  Cardiovascular: Normal rate, regular rhythm, normal heart sounds and normal pulses.   No murmur heard. Pulmonary/Chest: Effort normal. No respiratory distress. She has no wheezes. She has no rhonchi. She has no rales.  Musculoskeletal: Normal range of motion.  Lymphadenopathy:       Head (right side): No submental, no submandibular and no tonsillar adenopathy present.       Head (left side): No submental, no submandibular and no tonsillar adenopathy present.    She has no cervical adenopathy.  Neurological: She is alert and oriented to person, place, and time.  Skin: Skin is warm, dry and intact. No lesion and no rash noted.  Psychiatric: She has a normal mood and affect. Her speech is normal and behavior is normal. Thought content normal.   BP 124/72 mmHg  Pulse 88  Temp(Src) 99.1 F (37.3 C) (Oral)  Resp 17  Ht 5' 4"  (1.626 m)  Wt 176 lb (79.833 kg)  BMI 30.20 kg/m2  SpO2 97%  LMP 03/15/2014  Results for orders placed or performed in visit on 09/03/15  POCT CBC  Result Value Ref Range   WBC 10.7 (A) 4.6 - 10.2 K/uL   Lymph, poc 2.4 0.6 - 3.4   POC LYMPH PERCENT 22.1 10 - 50 %L   MID (cbc) 0.7 0 - 0.9   POC MID % 6.7 0 - 12 %M   POC Granulocyte 7.6 (A) 2 - 6.9   Granulocyte percent 71.2 37 - 80 %G   RBC 5.10 4.04 - 5.48 M/uL   Hemoglobin 14.0 12.2 - 16.2 g/dL   HCT, POC 40 37.7 - 47.9 %   MCV 78.4 (A) 80 - 97 fL   MCH, POC 27.6 27 - 31.2 pg   MCHC 35.2 31.8 - 35.4 g/dL   RDW, POC 14.2 %   Platelet Count, POC 364 142 - 424 K/uL   MPV 7.4 0 - 99.8 fL    Assessment and Plan:  1. Acute sinusitis, recurrence not specified, unspecified location 2. Fever, unspecified cause Exquisite sinus tenderness, + fever, mild leukocytosis with left shift. Will treat for sinus infection with augmentin which should also cover lung pathogens. Mucinex prescribed. Continue symbicort and albuterol as needed (lungs clear on exam today). Continue allegra-D. Follow up with Dr.  Tamala Julian on 5/10 or sooner if symptoms worsen/do not improve. - amoxicillin-clavulanate (AUGMENTIN) 875-125 MG tablet; Take 1 tablet by mouth 2 (two) times daily.  Dispense: 20 tablet; Refill: 0 - Guaifenesin (MUCINEX MAXIMUM STRENGTH) 1200 MG TB12; Take 1 tablet (1,200 mg total) by mouth every 12 (twelve) hours as needed.  Dispense: 14 tablet; Refill: 1 - POCT CBC   Winna Golla V. Drenda Freeze, MHS [facility]  09/03/2015

## 2015-09-03 NOTE — Patient Instructions (Addendum)
Take augmentin twice a day for 10 days. Use mucinex twice a day Drink plenty of water and get plenty of rest. Continue claritin-D. Continue symbicort Albuterol inhaler or nebulizer as needed Return in 1 week if symptoms do not improve or at any time if symptoms worsen.     IF you received an x-ray today, you will receive an invoice from [facility]. Please contact [facility] at [phone] with questions or concerns regarding your invoice.   IF you received labwork today, you will receive an invoice from [facility]. Please contact [facility] at [phone] with questions or concerns regarding your invoice.   Our billing staff will not be able to assist you with questions regarding bills from these companies.  You will be contacted with the lab results as soon as they are available. The fastest way to get your results is to activate your My Chart account. Instructions are located on the last page of this paperwork. If you have not heard from us regarding the results in 2 weeks, please contact this office.

## 2015-09-08 ENCOUNTER — Other Ambulatory Visit: Payer: Self-pay | Admitting: Family Medicine

## 2015-09-08 ENCOUNTER — Telehealth: Payer: Self-pay

## 2015-09-08 NOTE — Telephone Encounter (Signed)
PA is needed for Symbicort. Ins formulary looks like they cover the Advair HFA or Diskus. I don't see that pt has tried Advair before, if not Symbicort will not be approved for coverage. Do you want to send Rx for Advair?

## 2015-09-09 MED ORDER — FLUTICASONE-SALMETEROL 115-21 MCG/ACT IN AERO: 2.0000 | INHALATION_SPRAY | Freq: Two times a day (BID) | RESPIRATORY_TRACT | Status: DC

## 2015-09-09 NOTE — Telephone Encounter (Signed)
Advair HFA sent into pharmacy. Please all pt and advise that insurance does not cover Symbicort but does cover Advair.

## 2015-09-10 NOTE — Telephone Encounter (Signed)
Called pt and LM w/message about reason for med change.

## 2015-09-13 ENCOUNTER — Other Ambulatory Visit: Payer: Self-pay | Admitting: Family Medicine

## 2015-09-16 ENCOUNTER — Other Ambulatory Visit: Payer: Self-pay | Admitting: Family Medicine

## 2015-09-19 NOTE — Telephone Encounter (Signed)
Please call in or fax Temazepam rx to pharmacy.

## 2015-09-20 NOTE — Telephone Encounter (Signed)
Faxed

## 2015-09-21 ENCOUNTER — Other Ambulatory Visit: Payer: Self-pay | Admitting: Family Medicine

## 2015-09-25 ENCOUNTER — Other Ambulatory Visit: Payer: Self-pay | Admitting: Family Medicine

## 2015-09-25 DIAGNOSIS — R69 Illness, unspecified: Secondary | ICD-10-CM | POA: Diagnosis not present

## 2015-09-26 DIAGNOSIS — J4522 Mild intermittent asthma with status asthmaticus: Secondary | ICD-10-CM | POA: Diagnosis not present

## 2015-10-03 ENCOUNTER — Other Ambulatory Visit: Payer: Self-pay | Admitting: Family Medicine

## 2015-10-05 ENCOUNTER — Ambulatory Visit: Payer: Medicare HMO | Admitting: Family Medicine

## 2015-10-12 ENCOUNTER — Other Ambulatory Visit: Payer: Self-pay | Admitting: Family Medicine

## 2015-10-13 ENCOUNTER — Other Ambulatory Visit: Payer: Self-pay | Admitting: Family Medicine

## 2015-10-14 NOTE — Telephone Encounter (Signed)
Please call in Xanax refill as approved. Call patient --- she is due for six month follow-up with me; she will need to come to walk in clinic for follow-up.

## 2015-10-15 NOTE — Telephone Encounter (Signed)
Faxed. Notified pt on VM that her RF was sent and need for f/up for more.

## 2015-10-20 DIAGNOSIS — M542 Cervicalgia: Secondary | ICD-10-CM | POA: Diagnosis not present

## 2015-10-20 DIAGNOSIS — M50322 Other cervical disc degeneration at C5-C6 level: Secondary | ICD-10-CM | POA: Diagnosis not present

## 2015-10-20 DIAGNOSIS — Z79891 Long term (current) use of opiate analgesic: Secondary | ICD-10-CM | POA: Diagnosis not present

## 2015-10-20 DIAGNOSIS — G894 Chronic pain syndrome: Secondary | ICD-10-CM | POA: Diagnosis not present

## 2015-10-27 DIAGNOSIS — J4522 Mild intermittent asthma with status asthmaticus: Secondary | ICD-10-CM | POA: Diagnosis not present
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
## 2015-11-03 ENCOUNTER — Other Ambulatory Visit: Payer: Self-pay | Admitting: Family Medicine

## 2015-11-18 ENCOUNTER — Other Ambulatory Visit: Payer: Self-pay | Admitting: Physician Assistant

## 2015-11-18 ENCOUNTER — Other Ambulatory Visit: Payer: Self-pay | Admitting: Family Medicine

## 2015-11-23 NOTE — Telephone Encounter (Signed)
Call patient --- I denied refill of Temazepam.  She is overdue for follow-up with me. Her last visit with me was 04/2015.

## 2015-11-25 NOTE — Telephone Encounter (Signed)
LMOM for pt to advise she needs f/up and can call for same day appt.

## 2015-11-26 DIAGNOSIS — J4522 Mild intermittent asthma with status asthmaticus: Secondary | ICD-10-CM | POA: Diagnosis not present

## 2015-11-27 ENCOUNTER — Other Ambulatory Visit: Payer: Self-pay | Admitting: Family Medicine

## 2015-11-30 ENCOUNTER — Ambulatory Visit: Payer: Medicare HMO

## 2015-11-30 NOTE — Patient Instructions (Addendum)
     IF you received an x-ray today, you will receive an invoice from [facility]. Please contact [facility] at [phone] with questions or concerns regarding your invoice.   IF you received labwork today, you will receive an invoice from [facility]. Please contact [facility] at [phone] with questions or concerns regarding your invoice.   Our billing staff will not be able to assist you with questions regarding bills from these companies.  You will be contacted with the lab results as soon as they are available. The fastest way to get your results is to activate your My Chart account. Instructions are located on the last page of this paperwork. If you have not heard from us regarding the results in 2 weeks, please contact this office.     We recommend that you schedule a mammogram for breast cancer screening. Typically, you do not need a referral to do this. Please contact a local imaging center to schedule your mammogram.  [facility] - [phone]  *ask for the Radiology Department [facility] - [phone] or [phone]  [facility] - [phone] [facility] - [phone] [facility] - [phone]  *ask for the Radiology Department [facility] - [phone]  *ask for the Radiology Department [facility] - [phone]  *ask for the Mammography Department [facility] - [phone]  

## 2015-12-09 ENCOUNTER — Telehealth: Payer: Self-pay

## 2015-12-09 NOTE — Telephone Encounter (Signed)
Advise on Xanax.

## 2015-12-09 NOTE — Telephone Encounter (Signed)
Pt is asking for a refill of at least of a month for prozac,zanax blood pressure meds and a few more that she could not be named -pt was scheduled to be seen for this on 11/30/15 and could not stay due to family emergency -she will be calling on Friday to schedule a appointment on Saturday but needs these meds before then   Please call [phone]

## 2015-12-11 ENCOUNTER — Telehealth: Payer: Self-pay

## 2015-12-11 ENCOUNTER — Ambulatory Visit: Payer: Medicare HMO

## 2015-12-11 NOTE — Telephone Encounter (Signed)
Patient came in to be seen on 12/11/15, after looking at patient bad debit balance we explained our new policy to her about how she needs to pay before we can see her. After stating our policy patient became very upset and very vocal in the lobby about how it isn't fair to her because she has a mother who is dying and she needs her medication. She also stated that she spoke to someone in billing by the name of Thomasene Lot, I told her that she had not worked here in a long time, she then stated that she spoke to [place], I explained that she did not work in billing. She then got upset and stated that she didn't remember who she spoke to but they told her that she didn't have to pay this bill because she should have never been charged this amount. I apologized to her again and offered to let her call billing on Monday and speak to someone in there about her balance and see if they could work something out, April tried to offer her Access one and she didn't want to do that, she grabbed her things and walked out the door, then turned and came back in and stated very loudly in the lobby that she was going to sue [place].

## 2015-12-14 ENCOUNTER — Other Ambulatory Visit: Payer: Self-pay

## 2015-12-14 NOTE — Telephone Encounter (Signed)
Patient came in on Sat per Dr. Michaelle CopasSmith's request if patient was not feeling better.  Patient wanted to get medication refilled since she is already out at least for 30 days, until she can resolve the billing issue she has with [facility].  ALPRAZolam (XANAX) 1 MG tablet amitriptyline (ELAVIL) 25 MG tablet FLUoxetine (PROZAC) 20 MG tablet  Pharmacy: [facility] [address] - [place], [place] - [id number]  Patient would like for Dr. Katrinka BlazingSmith or someone to call her if the medication is refilled or not. Pt # [phone]

## 2015-12-14 NOTE — Telephone Encounter (Signed)
Please advise on Alprazolam

## 2015-12-15 MED ORDER — FLUOXETINE HCL 20 MG PO TABS: 60.0000 mg | ORAL_TABLET | Freq: Every day | ORAL | 0 refills | Status: DC

## 2015-12-15 MED ORDER — ALPRAZOLAM 1 MG PO TABS: 1.0000 mg | ORAL_TABLET | Freq: Every evening | ORAL | 0 refills | Status: DC | PRN

## 2015-12-15 MED ORDER — AMITRIPTYLINE HCL 25 MG PO TABS: 25.0000 mg | ORAL_TABLET | Freq: Every day | ORAL | 0 refills | Status: DC

## 2015-12-15 NOTE — Telephone Encounter (Signed)
Faxed RF of alprazolam and LMOM for pt advising her of Dr Michaelle CopasSmith's message.

## 2015-12-15 NOTE — Addendum Note (Signed)
Addended by: Ethelda ChickSMITH, KRISTI M on: 12/15/2015 09:53 AM   Modules accepted: Orders

## 2015-12-15 NOTE — Telephone Encounter (Signed)
I approved refills for Prozac and Amitriptyline for one month.   I also refilled Xanax  but for one tablet daily until seen.

## 2015-12-23 NOTE — Telephone Encounter (Signed)
Patient called billing on 12/13/2015.  I explained all the bad debit and the self pay balance to the patient over the phone.   Patient was shocked and no one had every said anything about the balance before, so I explained the new policy.  Also reviewed when the patient did receive the statements.  Mailed itemized statement to patient's mother's house per patient request and to her home address in the system.   I wrote on the statement explaining each charged and if she had insurance or not at the time of service.   Patient was concerned about medication, so I entered a telephone message for a medication refill and sent Dr. Katrinka BlazingSmith a staff message explaining the situation to help the patient feel better.

## 2015-12-27 DIAGNOSIS — J4522 Mild intermittent asthma with status asthmaticus: Secondary | ICD-10-CM | POA: Diagnosis not present

## 2016-01-07 NOTE — Telephone Encounter (Signed)
Refills of Prozac, Amitriptyline, Xanax.

## 2016-01-18 ENCOUNTER — Ambulatory Visit (INDEPENDENT_AMBULATORY_CARE_PROVIDER_SITE_OTHER): Payer: Medicare HMO | Admitting: Family Medicine

## 2016-01-18 ENCOUNTER — Other Ambulatory Visit: Payer: Self-pay | Admitting: Family Medicine

## 2016-01-18 ENCOUNTER — Encounter: Payer: Self-pay | Admitting: Family Medicine

## 2016-01-18 VITALS — BP 126/80 | HR 84 | Temp 98.1°F | Resp 17 | Ht 64.5 in | Wt 178.0 lb

## 2016-01-18 DIAGNOSIS — E119 Type 2 diabetes mellitus without complications: Secondary | ICD-10-CM

## 2016-01-18 DIAGNOSIS — E7800 Pure hypercholesterolemia, unspecified: Secondary | ICD-10-CM

## 2016-01-18 DIAGNOSIS — Z1159 Encounter for screening for other viral diseases: Secondary | ICD-10-CM

## 2016-01-18 DIAGNOSIS — J301 Allergic rhinitis due to pollen: Secondary | ICD-10-CM

## 2016-01-18 DIAGNOSIS — N182 Chronic kidney disease, stage 2 (mild): Secondary | ICD-10-CM

## 2016-01-18 DIAGNOSIS — I10 Essential (primary) hypertension: Secondary | ICD-10-CM | POA: Diagnosis not present

## 2016-01-18 DIAGNOSIS — Z23 Encounter for immunization: Secondary | ICD-10-CM

## 2016-01-18 LAB — CBC WITH DIFFERENTIAL/PLATELET
Basophils Absolute: 0 cells/uL (ref 0–200)
Basophils Relative: 0 %
EOS PCT: 3 %
Eosinophils Absolute: 264 cells/uL (ref 15–500)
HCT: 41.6 % (ref 35.0–45.0)
HEMOGLOBIN: 13.6 g/dL (ref 11.7–15.5)
LYMPHS ABS: 1848 {cells}/uL (ref 850–3900)
LYMPHS PCT: 21 %
MCH: 26.8 pg — ABNORMAL LOW (ref 27.0–33.0)
MCHC: 32.7 g/dL (ref 32.0–36.0)
MCV: 82.1 fL (ref 80.0–100.0)
MONOS PCT: 6 %
MPV: 9.9 fL (ref 7.5–12.5)
Monocytes Absolute: 528 cells/uL (ref 200–950)
NEUTROS PCT: 70 %
Neutro Abs: 6160 cells/uL (ref 1500–7800)
PLATELETS: 346 10*3/uL (ref 140–400)
RBC: 5.07 MIL/uL (ref 3.80–5.10)
RDW: 14.8 % (ref 11.0–15.0)
WBC: 8.8 10*3/uL (ref 3.8–10.8)

## 2016-01-18 LAB — COMPREHENSIVE METABOLIC PANEL
ALT: 17 U/L (ref 6–29)
AST: 18 U/L (ref 10–35)
Albumin: 4.2 g/dL (ref 3.6–5.1)
Alkaline Phosphatase: 58 U/L (ref 33–130)
BILIRUBIN TOTAL: 0.3 mg/dL (ref 0.2–1.2)
BUN: 21 mg/dL (ref 7–25)
CHLORIDE: 99 mmol/L (ref 98–110)
CO2: 25 mmol/L (ref 20–31)
CREATININE: 1.13 mg/dL — AB (ref 0.50–0.99)
Calcium: 9.6 mg/dL (ref 8.6–10.4)
GLUCOSE: 148 mg/dL — AB (ref 65–99)
Potassium: 4.7 mmol/L (ref 3.5–5.3)
SODIUM: 135 mmol/L (ref 135–146)
Total Protein: 7.3 g/dL (ref 6.1–8.1)

## 2016-01-18 LAB — LIPID PANEL
Cholesterol: 229 mg/dL — ABNORMAL HIGH (ref 125–200)
HDL: 69 mg/dL (ref >=46)
LDL CALC: 125 mg/dL (ref <130)
Total CHOL/HDL Ratio: 3.3 Ratio (ref <=5.0)
Triglycerides: 177 mg/dL — ABNORMAL HIGH (ref <150)
VLDL: 35 mg/dL — AB (ref <30)

## 2016-01-18 LAB — HEPATITIS C ANTIBODY: HCV Ab: NEGATIVE

## 2016-01-18 LAB — HEMOGLOBIN A1C
HEMOGLOBIN A1C: 7.5 % — AB (ref <5.7)
MEAN PLASMA GLUCOSE: 169 mg/dL

## 2016-01-18 MED ORDER — ESOMEPRAZOLE MAGNESIUM 40 MG PO CPDR: DELAYED_RELEASE_CAPSULE | ORAL | 3 refills | Status: DC

## 2016-01-18 MED ORDER — FLUTICASONE-SALMETEROL 115-21 MCG/ACT IN AERO: 2.0000 | INHALATION_SPRAY | Freq: Two times a day (BID) | RESPIRATORY_TRACT | 12 refills | Status: DC

## 2016-01-18 MED ORDER — ALBUTEROL SULFATE HFA 108 (90 BASE) MCG/ACT IN AERS: INHALATION_SPRAY | RESPIRATORY_TRACT | 2 refills | Status: DC

## 2016-01-18 MED ORDER — FLUOXETINE HCL 20 MG PO TABS: 80.0000 mg | ORAL_TABLET | Freq: Every day | ORAL | 5 refills | Status: DC

## 2016-01-18 MED ORDER — ALPRAZOLAM 0.5 MG PO TABS: 0.5000 mg | ORAL_TABLET | Freq: Every evening | ORAL | 0 refills | Status: DC | PRN

## 2016-01-18 MED ORDER — CARVEDILOL 25 MG PO TABS: 25.0000 mg | ORAL_TABLET | Freq: Two times a day (BID) | ORAL | 3 refills | Status: DC

## 2016-01-18 MED ORDER — GABAPENTIN 300 MG PO CAPS
300.0000 mg | ORAL_CAPSULE | Freq: Every day | ORAL | 1 refills | Status: DC
Start: 2016-01-18 — End: 2019-04-15

## 2016-01-18 NOTE — Progress Notes (Signed)
Patient ID: Dana Romero, female   DOB: Oct 21, 1950, 65 y.o.   MRN: [id number]   By signing my name below I, Dana Romero, attest that this documentation has been prepared under the direction and in the presence of Ethelda Chick MD. Electonically Signed. Dana Romero, Scribe 01/18/2016 at 10:48 AM  Subjective:    Patient ID: Dana Romero, female    DOB: 08-09-1950, 65 y.o.   MRN: [id number]  01/18/2016  Medication Refill (needs all her medicine refilled on her list )   HPI Dana Romero is a 65 y.o. female who presents to the [facility] for follow up reevaluation of her DM.   Pt reports that she has occasional episodes of CP and she takes sublingual nitro. Pt denies any SOB. Pt sees her cardiologist once a year.   Pt states that she does not have a BM every day. Pt has been taking stool softeners to control her BMs.  DM Pt reports that she never took her [place]., so her fasting blood sugar has been running in the 190s-200s in the morning   Asthma Pt takes advair BID.   Allergies Pt states she has been out of her allegra. Pt reports a cough at night and some mild nasal congestion and ear pressure.   Anxiety Pt reports that she has been having flare ups of anxiety in the past few months. Pt already takes 20mg  Fluoxidine TID. Pt also has prescription of xanax that she uses PRN. Pt also is prescribed Hydrocodone by Dr Ethelene Hal for pain management.  Pt's mother passed this year from stomach cancer. Pt's mother's funeral was yesterday on (01/17/16).  Pt requesting flu and pneumonia vaccines.  Review of Systems  Constitutional: Negative for chills, diaphoresis, fatigue and fever.  HENT: Positive for congestion and postnasal drip. Negative for ear pain, sore throat and trouble swallowing.   Eyes: Negative for visual disturbance.  Respiratory: Positive for cough. Negative for apnea, choking, chest tightness, shortness of breath, wheezing and stridor.     Cardiovascular: Positive for chest pain (occasional episodes). Negative for palpitations and leg swelling.  Gastrointestinal: Negative for abdominal distention, abdominal pain, anal bleeding, blood in stool, constipation, diarrhea, nausea and vomiting.  Endocrine: Negative for cold intolerance, heat intolerance, polydipsia, polyphagia and polyuria.  Neurological: Negative for dizziness, tremors, seizures, syncope, facial asymmetry, speech difficulty, weakness, light-headedness, numbness and headaches.  Psychiatric/Behavioral: The patient is nervous/anxious.     Past Medical History:  Diagnosis Date  . Allergy    allergy eyes  . Anginal pain (HCC)    chest pain -usually anxiety related- Nitrogycerin helpful.  . Anxiety   . Asthma   . Chronic kidney disease    PER PT, Dr. Allena Katz follows every 6 months  . Chronic pain syndrome    DDD cervical spine; managed by Ramos.  Daily narcotics.  . Degenerative disc disease, cervical    followed by Ramos; maintained on daily narcotics.  . Depression   . Diabetes mellitus 1998   off meds since 2009 or 2010  . GERD (gastroesophageal reflux disease)   . Gout   . Hypertension 1998  . Sinusitis, chronic   . Transfusion history    7 yrs ago - time of hospital visit Pancreatitis   Past Surgical History:  Procedure Laterality Date  . APPENDECTOMY    . ARM DEBRIDEMENT Left    debridement of axilla "spider bite"  . BREAST SURGERY     breast cyst x2 -last '12  .  CHOLECYSTECTOMY N/A 08/11/2015   Procedure: LAPAROSCOPIC CHOLECYSTECTOMY;  Surgeon: De BlanchLuke Aaron Kinsinger, MD;  Location: [facility];  Service: General;  Laterality: N/A;  . TONSILLECTOMY    . TONSILLECTOMY AND ADENOIDECTOMY    . TUBAL LIGATION     post partum   Allergies  Allergen Reactions  . Amlodipine Besylate     REACTION: intolerance  . Atorvastatin     REACTION: intolerance  . Ezetimibe-Simvastatin     REACTION: intolerance  . Losartan Potassium-Hctz     Pt is unsure   .  Morphine And Related Other (See Comments)    MIGRAINE  . Niacin     REACTION: intolerance  . Rosuvastatin     REACTION: intolerance  . Toprol Xl [Metoprolol Succinate] Other (See Comments)    Makes her feel weak  . Valium [Diazepam]     Has reverse effect on patient   . Latex Rash    Social History   Social History  . Marital status: Married    Spouse name: N/A  . Number of children: N/A  . 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 Smoking status: Never Smoker  . Smokeless tobacco: Never Used  . Alcohol use No     Comment: none in many years -moderate use previous  . Drug use: No  . Sexual activity: Not Currently    Partners: Male   Other Topics Concern  . Not on file   Social History Narrative  . No narrative on file   Family History  Problem Relation Age of Onset  . Hypertension Mother   . Depression Mother   . Cancer Mother [id number]    stomach cancer  . Diabetes Maternal Grandmother   . Hypertension Maternal Grandmother   . Breast cancer Maternal Grandmother   . Heart attack Maternal Grandmother   . Hypertension Brother   . Stroke Other   . Diabetes Maternal Aunt   . Breast cancer Maternal Aunt   . Diabetes Maternal Uncle   . Hypertension Maternal Uncle   . Hypertension Maternal Uncle        Objective:    BP 126/80 (BP Location: Right Arm, Patient Position: Sitting, Cuff Size: Normal)   Pulse 84   Temp 98.1 F (36.7 C) (Oral)   Resp 17   Ht 5' 4.5" (1.638 m)   Wt 178 lb (80.7 kg)   LMP 03/15/2014   SpO2 98%   BMI 30.08 kg/m  Physical Exam  Constitutional: She is oriented to person, place, and time. She appears well-developed and well-nourished. No distress.  HENT:  Head: Normocephalic and atraumatic.  Right Ear: Hearing, tympanic membrane, external ear and ear canal normal.  Left Ear: Hearing, tympanic membrane, external ear and ear canal normal.  Nose: Nose normal.    Mouth/Throat: Oropharynx is clear and moist.  Eyes: Conjunctivae and EOM are normal. Pupils are equal, round, and reactive to light.  Neck: Normal range of motion. Neck supple. Carotid bruit is not present. No thyromegaly present.  Cardiovascular: Normal rate, regular rhythm, normal heart sounds and intact distal pulses.  Exam reveals no gallop and no friction rub.   No murmur heard. Pulmonary/Chest: Effort normal and breath sounds normal. She has no wheezes. She has no rales.  Abdominal: Soft. Bowel sounds are normal. She exhibits no distension and no mass. There is no tenderness. There is no rebound and no guarding.  Lymphadenopathy:  She has no cervical adenopathy.  Neurological: She is alert and oriented to person, place, and time. No cranial nerve deficit.  Skin: Skin is warm and dry. No rash noted. She is not diaphoretic. No erythema. No pallor.  Psychiatric: She has a normal mood and affect. Her behavior is normal.        Assessment & Plan:   1. Type 2 diabetes mellitus without complication, without long-term current use of insulin (HCC)   2. Essential hypertension   3. Allergic rhinitis due to pollen   4. Chronic kidney disease, stage 2 (mild)   5. Pure hypercholesterolemia   6. Need for hepatitis C screening test   7. Need for prophylactic vaccination and inoculation against influenza    -increase Prozac to 80mg  daily.  Recommend weaning Xanax to off due to chronic opiate use; pt expressed understanding; will not refill Xanax or Temazepam. -obtain labs. -s/p flu vaccine and Pneumovax   Orders Placed This Encounter  Procedures  . Flu Vaccine QUAD 36+ mos IM  . Pneumococcal polysaccharide vaccine 23-valent greater than or equal to 2yo subcutaneous/IM  . CBC with Differential/Platelet  . Comprehensive metabolic panel    Order Specific Question:   Has the patient fasted?    Answer:   Yes  . Lipid panel    Order Specific Question:   Has the patient fasted?    Answer:    Yes  . Hepatitis C antibody  . Hemoglobin A1c  . Microalbumin, urine  . POCT urinalysis dipstick   Meds ordered this encounter  Medications  . FLUoxetine (PROZAC) 20 MG tablet    Sig: Take 4 tablets (80 mg total) by mouth daily.    Dispense:  120 tablet    Refill:  5    PATIENT NEEDS OFFICE VISIT FOR ADDITIONAL REFILLS  . albuterol (VENTOLIN HFA) 108 (90 Base) MCG/ACT inhaler    Sig: INHALE ONE TO TWO PUFFS BY MOUTH EVERY 6 HOURS AS NEEDED FOR WHEEZING OR SHORTNESS OF BREATH (OR PERSISTENT COUGHING)    Dispense:  18 each    Refill:  2  . fluticasone-salmeterol (ADVAIR HFA) 115-21 MCG/ACT inhaler    Sig: Inhale 2 puffs into the lungs 2 (two) times daily.    Dispense:  1 Inhaler    Refill:  12  . ALPRAZolam (XANAX) 0.5 MG tablet    Sig: Take 1 tablet (0.5 mg total) by mouth at bedtime as needed. for anxiety    Dispense:  30 tablet    Refill:  0  . carvedilol (COREG) 25 MG tablet    Sig: Take 1 tablet (25 mg total) by mouth 2 (two) times daily with a meal.    Dispense:  180 tablet    Refill:  3  . esomeprazole (NEXIUM) 40 MG capsule    Sig: TAKE ONE CAPSULE BY MOUTH ONCE DAILY BEFORE  BREAKFAST    Dispense:  90 capsule    Refill:  3    OFFICE VISIT NEEDED FOR REFILLS  . gabapentin (NEURONTIN) 300 MG capsule    Sig: Take 1 capsule (300 mg total) by mouth at bedtime.    Dispense:  90 capsule    Refill:  1    Return in about 3 months (around 04/18/2016) for recheck.   I personally performed the services described in this documentation, which was scribed in my presence. The recorded information has been reviewed and considered.  Kristi Paulita Fujita, M.D. [facility] [address]  [place], [place]  [address] [phone] phone [phone] fax

## 2016-01-18 NOTE — Patient Instructions (Addendum)
   IF you received an x-ray today, you will receive an invoice from [facility]. Please contact  Radiology at [phone] with questions or concerns regarding your invoice.   IF you received labwork today, you will receive an invoice from [facility]. Please contact [facility] at [phone] with questions or concerns regarding your invoice.   Our billing staff will not be able to assist you with questions regarding bills from these companies.  You will be contacted with the lab results as soon as they are available. The fastest way to get your results is to activate your My Chart account. Instructions are located on the last page of this paperwork. If you have not heard from us regarding the results in 2 weeks, please contact this office.     Basic Carbohydrate Counting for Diabetes Mellitus Carbohydrate counting is a method for keeping track of the amount of carbohydrates you eat. Eating carbohydrates naturally increases the level of sugar (glucose) in your blood, so it is important for you to know the amount that is okay for you to have in every meal. Carbohydrate counting helps keep the level of glucose in your blood within normal limits. The amount of carbohydrates allowed is different for every person. A dietitian can help you calculate the amount that is right for you. Once you know the amount of carbohydrates you can have, you can count the carbohydrates in the foods you want to eat. Carbohydrates are found in the following foods:  Grains, such as breads and cereals.  Dried beans and soy products.  Starchy vegetables, such as potatoes, peas, and corn.  Fruit and fruit juices.  Milk and yogurt.  Sweets and snack foods, such as cake, cookies, candy, chips, soft drinks, and fruit drinks. CARBOHYDRATE COUNTING There are two ways to count the carbohydrates in your food. You can use either of the methods or a combination of both. Reading  the "Nutrition Facts" on Packaged Food The "Nutrition Facts" is an area that is included on the labels of almost all packaged food and beverages in the [place]. It includes the serving size of that food or beverage and information about the nutrients in each serving of the food, including the grams (g) of carbohydrate per serving.  Decide the number of servings of this food or beverage that you will be able to eat or drink. Multiply that number of servings by the number of grams of carbohydrate that is listed on the label for that serving. The total will be the amount of carbohydrates you will be having when you eat or drink this food or beverage. Learning Standard Serving Sizes of Food When you eat food that is not packaged or does not include "Nutrition Facts" on the label, you need to measure the servings in order to count the amount of carbohydrates.A serving of most carbohydrate-rich foods contains about 15 g of carbohydrates. The following list includes serving sizes of carbohydrate-rich foods that provide 15 g ofcarbohydrate per serving:   1 slice of bread (1 oz) or 1 six-inch tortilla.    of a hamburger bun or English muffin.  4-6 crackers.   cup unsweetened dry cereal.    cup hot cereal.   cup rice or pasta.    cup mashed potatoes or  of a large baked potato.  1 cup fresh fruit or one small piece of fruit.    cup canned or frozen fruit or fruit juice.  1 cup milk.   cup   plain fat-free yogurt or yogurt sweetened with artificial sweeteners.   cup cooked dried beans or starchy vegetable, such as peas, corn, or potatoes.  Decide the number of standard-size servings that you will eat. Multiply that number of servings by 15 (the grams of carbohydrates in that serving). For example, if you eat 2 cups of strawberries, you will have eaten 2 servings and 30 g of carbohydrates (2 servings x 15 g = 30 g). For foods such as soups and casseroles, in which more than one  food is mixed in, you will need to count the carbohydrates in each food that is included. EXAMPLE OF CARBOHYDRATE COUNTING Sample Dinner  3 oz chicken breast.   cup of brown rice.   cup of corn.  1 cup milk.   1 cup strawberries with sugar-free whipped topping.  Carbohydrate Calculation Step 1: Identify the foods that contain carbohydrates:   Rice.   Corn.   Milk.   Strawberries. Step 2:Calculate the number of servings eaten of each:   2 servings of rice.   1 serving of corn.   1 serving of milk.   1 serving of strawberries. Step 3: Multiply each of those number of servings by 15 g:   2 servings of rice x 15 g = 30 g.   1 serving of corn x 15 g = 15 g.   1 serving of milk x 15 g = 15 g.   1 serving of strawberries x 15 g = 15 g. Step 4: Add together all of the amounts to find the total grams of carbohydrates eaten: 30 g + 15 g + 15 g + 15 g = 75 g.   This information is not intended to replace advice given to you by your health care provider. Make sure you discuss any questions you have with your health care provider.   Document Released: 04/24/2005 Document Revised: 05/15/2014 Document Reviewed: 03/21/2013 Elsevier Interactive Patient Education 2016 Elsevier Inc.  

## 2016-01-19 ENCOUNTER — Ambulatory Visit (INDEPENDENT_AMBULATORY_CARE_PROVIDER_SITE_OTHER): Payer: Medicare HMO | Admitting: Family Medicine

## 2016-01-19 ENCOUNTER — Other Ambulatory Visit: Payer: Self-pay | Admitting: Family Medicine

## 2016-01-19 VITALS — BP 116/70 | HR 93 | Temp 98.0°F | Resp 18 | Ht 64.5 in

## 2016-01-19 DIAGNOSIS — T50Z95A Adverse effect of other vaccines and biological substances, initial encounter: Secondary | ICD-10-CM

## 2016-01-19 MED ORDER — LORATADINE 10 MG PO TABS: 10.0000 mg | ORAL_TABLET | Freq: Every day | ORAL | 11 refills | Status: DC

## 2016-01-19 MED ORDER — ESTRADIOL 0.05 MG/24HR TD PTTW: 1.0000 | MEDICATED_PATCH | TRANSDERMAL | 99 refills | Status: DC

## 2016-01-19 MED ORDER — RANITIDINE HCL 150 MG PO TABS
150.0000 mg | ORAL_TABLET | Freq: Once | ORAL | Status: AC
  Administered 2016-01-19: 150 mg via ORAL

## 2016-01-19 MED ORDER — CETIRIZINE HCL 10 MG PO TABS
10.0000 mg | ORAL_TABLET | Freq: Once | ORAL | Status: AC
Start: 2016-01-19 — End: 2016-01-19
  Administered 2016-01-19: 10 mg via ORAL

## 2016-01-19 MED ORDER — LOSARTAN POTASSIUM 100 MG PO TABS: 100.0000 mg | ORAL_TABLET | Freq: Every day | ORAL | 1 refills | Status: DC

## 2016-01-19 NOTE — Progress Notes (Signed)
By signing my name below, I, Mesha Guinyard, attest that this documentation has been prepared under the direction and in the presence of Nilda SimmerKristi Katelynne Revak, MD.  Electronically Signed: Arvilla MarketMesha Guinyard, Medical Scribe. 01/19/2016. 3:29 PM.  Subjective:    Patient ID: Dana PapJoyce C Yanko, female    DOB: February 25, 1951, 65 y.o.   MRN: [id number]  01/19/2016  Allergic Reaction (Pt. received pneumonia vaccine yesterday and arm is swollen and red. )   HPI  HPI Comments: Dana Romero is a 65 y.o. female who presents to the [facility] complaining of local reaction on her left arm from PNA vaccines yesterday. Pt reports left arm soreness that radiates to her finger tips, erythema from the site, warmth, and low grade fever. Pt has had other PNA vaccines in the past, but she's never had a localized reaction. Pt reports SOB, and scratchy throat but suspects her SOB is coming from her congestion. Pt has not taken anything for relief to her symptoms. Pt denies throat swelling.  Immunization History  Administered Date(s) Administered  . Influenza Split 01/17/2012  . Influenza Whole 02/21/2006  . Influenza,inj,Quad PF,36+ Mos 04/12/2015, 01/18/2016  . PPD Test 01/12/2012, 04/24/2014, 04/12/2015  . Pneumococcal Conjugate-13 11/25/2014  . Pneumococcal Polysaccharide-23 08/21/2005, 01/18/2016  . Td 05/08/1998  . Tdap 05/13/2010    Review of Systems  Constitutional: Positive for fever. Negative for chills, diaphoresis and fatigue.  HENT: Positive for sore throat (scratchy). Negative for ear pain, postnasal drip, rhinorrhea, sinus pressure and trouble swallowing.   Respiratory: Positive for shortness of breath. Negative for cough.   Cardiovascular: Negative for chest pain, palpitations and leg swelling.  Gastrointestinal: Negative for abdominal pain, constipation, diarrhea, nausea and vomiting.  Skin: Positive for color change.   Past Medical History:  Diagnosis Date  . Allergy    allergy  eyes  . Anginal pain (HCC)    chest pain -usually anxiety related- Nitrogycerin helpful.  . Anxiety   . Asthma   . Chronic kidney disease    PER PT, Dr. Allena KatzPatel follows every 6 months  . Chronic pain syndrome    DDD cervical spine; managed by Ramos.  Daily narcotics.  . Degenerative disc disease, cervical    followed by Ramos; maintained on daily narcotics.  . Depression   . Diabetes mellitus 1998   off meds since 2009 or 2010  . GERD (gastroesophageal reflux disease)   . Gout   . Hypertension 1998  . Sinusitis, chronic   . Transfusion history    7 yrs ago - time of hospital visit Pancreatitis   Past Surgical History:  Procedure Laterality Date  . APPENDECTOMY    . ARM DEBRIDEMENT Left    debridement of axilla "spider bite"  . BREAST SURGERY     breast cyst x2 -last '12  . CHOLECYSTECTOMY N/A 08/11/2015   Procedure: LAPAROSCOPIC CHOLECYSTECTOMY;  Surgeon: De BlanchLuke Aaron Kinsinger, MD;  Location: [facility];  Service: General;  Laterality: N/A;  . TONSILLECTOMY    . TONSILLECTOMY AND ADENOIDECTOMY    . TUBAL LIGATION     post partum   Allergies  Allergen Reactions  . Amlodipine Besylate     REACTION: intolerance  . Atorvastatin     REACTION: intolerance  . Ezetimibe-Simvastatin     REACTION: intolerance  . Losartan Potassium-Hctz     Pt is unsure   . Morphine And Related Other (See Comments)    MIGRAINE  . Niacin     REACTION: intolerance  .  Rosuvastatin     REACTION: intolerance  . Toprol Xl [Metoprolol Succinate] Other (See Comments)    Makes her feel weak  . Valium [Diazepam]     Has reverse effect on patient   . Latex Rash    Social History   Social History  . Marital status: Married    Spouse name: N/A  . Number of children: N/A  . 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 Smoking status: Never Smoker  . Smokeless tobacco: Never Used  . Alcohol use No     Comment: none in many  years -moderate use previous  . Drug use: No  . Sexual activity: Not Currently    Partners: Male   Other Topics Concern  . Not on file   Social History Narrative  . No narrative on file   Family History  Problem Relation Age of Onset  . Hypertension Mother   . Depression Mother   . Cancer Mother 59    stomach cancer  . Diabetes Maternal Grandmother   . Hypertension Maternal Grandmother   . Breast cancer Maternal Grandmother   . Heart attack Maternal Grandmother   . Hypertension Brother   . Stroke Other   . Diabetes Maternal Aunt   . Breast cancer Maternal Aunt   . Diabetes Maternal Uncle   . Hypertension Maternal Uncle   . Hypertension Maternal Uncle    Objective:    BP 116/70   Pulse 93   Temp 98 F (36.7 C) (Oral)   Resp 18   Ht 5' 4.5" (1.638 m)   LMP 03/15/2014   SpO2 93%  Physical Exam  Constitutional: She appears well-developed and well-nourished. No distress.  HENT:  Head: Normocephalic and atraumatic.  Mouth/Throat: Oropharynx is clear and moist.  Slightly flushed face  Eyes: Conjunctivae are normal.  Neck: Neck supple.  Cardiovascular: Normal rate, regular rhythm and normal heart sounds.  Exam reveals no gallop and no friction rub.   No murmur heard. Pulmonary/Chest: Effort normal and breath sounds normal. No respiratory distress. She has no wheezes. She has no rales.  Neurological: She is alert.  Skin: Skin is warm and dry.  17cm x 11cm raised erythema on dorsal side of LEFT arm with warmth  Psychiatric: She has a normal mood and affect. Her behavior is normal.  Nursing note and vitals reviewed.   Assessment & Plan:   1. Immunization reaction, initial encounter    -New.  Localized reaction to Pneumovax. -Elevate your left to reduce the swelling. -administered Zyrtec, Zantac in office. -rx for Claritin provided. -recommend icing area bid for 20 minutes. -return for fever>100, increasing redness/pain/swelling.  No orders of the defined types  were placed in this encounter.  Meds ordered this encounter  Medications  . cetirizine (ZYRTEC) tablet 10 mg  . ranitidine (ZANTAC) tablet 150 mg  . loratadine (CLARITIN) 10 MG tablet    Sig: Take 1 tablet (10 mg total) by mouth daily.    Dispense:  30 tablet    Refill:  11    No Follow-up on file.  I personally performed the services described in this documentation, which was scribed in my presence. The recorded information has been reviewed and considered.  Charnita Trudel Paulita Fujita, M.D. [facility] [address] [place], [place]  [address] [phone] phone [phone] fax

## 2016-01-19 NOTE — Telephone Encounter (Signed)
Patient states that she was seen yesterday and forgot to mention that she needs refills for estridol and generic cozar. Please advise!

## 2016-01-19 NOTE — Patient Instructions (Addendum)
   IF you received an x-ray today, you will receive an invoice from [facility]. Please contact [facility] at [phone] with questions or concerns regarding your invoice.   IF you received labwork today, you will receive an invoice from [facility]. Please contact [facility] at [phone] with questions or concerns regarding your invoice.   Our billing staff will not be able to assist you with questions regarding bills from these companies.  You will be contacted with the lab results as soon as they are available. The fastest way to get your results is to activate your My Chart account. Instructions are located on the last page of this paperwork. If you have not heard from us regarding the results in 2 weeks, please contact this office.     Pneumococcal Vaccine, Polyvalent solution for injection What is this medicine? PNEUMOCOCCAL VACCINE, POLYVALENT (NEU mo KOK al vak SEEN, pol ee VEY luhnt) is a vaccine to prevent pneumococcus bacteria infection. These bacteria are a major cause of ear infections, Strep throat infections, and serious pneumonia, meningitis, or blood infections worldwide. These vaccines help the body to produce antibodies (protective substances) that help your body defend against these bacteria. This vaccine is recommended for people 2 years of age and older with health problems. It is also recommended for all adults over 50 years old. This vaccine will not treat an infection. This medicine may be used for other purposes; ask your health care provider or pharmacist if you have questions. What should I tell my health care provider before I take this medicine? They need to know if you have any of these conditions: -bleeding problems -bone marrow or organ transplant -cancer, Hodgkin's disease -fever -infection -immune system problems -low platelet count in the blood -seizures -an unusual or allergic reaction to pneumococcal  vaccine, diphtheria toxoid, other vaccines, latex, other medicines, foods, dyes, or preservatives -pregnant or trying to get pregnant -breast-feeding How should I use this medicine? This vaccine is for injection into a muscle or under the skin. It is given by a health care professional. A copy of Vaccine Information Statements will be given before each vaccination. Read this sheet carefully each time. The sheet may change frequently. Talk to your pediatrician regarding the use of this medicine in children. While this drug may be prescribed for children as young as 2 years of age for selected conditions, precautions do apply. Overdosage: If you think you have taken too much of this medicine contact a poison control center or emergency room at once. NOTE: This medicine is only for you. Do not share this medicine with others. What if I miss a dose? It is important not to miss your dose. Call your doctor or health care professional if you are unable to keep an appointment. What may interact with this medicine? -medicines for cancer chemotherapy -medicines that suppress your immune function -medicines that treat or prevent blood clots like warfarin, enoxaparin, and dalteparin -steroid medicines like prednisone or cortisone This list may not describe all possible interactions. Give your health care provider a list of all the medicines, herbs, non-prescription drugs, or dietary supplements you use. Also tell them if you smoke, drink alcohol, or use illegal drugs. Some items may interact with your medicine. What should I watch for while using this medicine? Mild fever and pain should go away in 3 days or less. Report any unusual symptoms to your doctor or health care professional. What side effects may I notice from receiving this   medicine? Side effects that you should report to your doctor or health care professional as soon as possible: -allergic reactions like skin rash, itching or hives, swelling of  the face, lips, or tongue -breathing problems -confused -fever over 102 degrees F -pain, tingling, numbness in the hands or feet -seizures -unusual bleeding or bruising -unusual muscle weakness Side effects that usually do not require medical attention (report to your doctor or health care professional if they continue or are bothersome): -aches and pains -diarrhea -fever of 102 degrees F or less -headache -irritable -loss of appetite -pain, tender at site where injected -trouble sleeping This list may not describe all possible side effects. Call your doctor for medical advice about side effects. You may report side effects to FDA at 1-800-FDA-1088. Where should I keep my medicine? This does not apply. This vaccine is given in a clinic, pharmacy, doctor's office, or other health care setting and will not be stored at home. NOTE: This sheet is a summary. It may not cover all possible information. If you have questions about this medicine, talk to your doctor, pharmacist, or health care provider.    2016, Elsevier/Gold Standard. (2007-11-29 14:32:37)  

## 2016-01-19 NOTE — Telephone Encounter (Signed)
Call -- I refilled Vivelle Dot/estrogen patch yet lowered the dose to 0.05mg /24hr.  Patient needs to be on a lower dose of Vivelle Dot as she ages.  I also refilled Losartan.

## 2016-01-20 NOTE — Telephone Encounter (Signed)
LMOM advising pt of lower dose of estrogen patch. Asked for CB w/any ?s.

## 2016-01-25 DIAGNOSIS — I10 Essential (primary) hypertension: Secondary | ICD-10-CM | POA: Diagnosis not present

## 2016-01-25 DIAGNOSIS — E119 Type 2 diabetes mellitus without complications: Secondary | ICD-10-CM | POA: Diagnosis not present

## 2016-01-27 DIAGNOSIS — J4522 Mild intermittent asthma with status asthmaticus: Secondary | ICD-10-CM | POA: Diagnosis not present

## 2016-02-09 DIAGNOSIS — M542 Cervicalgia: Secondary | ICD-10-CM | POA: Diagnosis not present

## 2016-02-09 DIAGNOSIS — G894 Chronic pain syndrome: Secondary | ICD-10-CM | POA: Diagnosis not present

## 2016-02-09 DIAGNOSIS — Z79891 Long term (current) use of opiate analgesic: Secondary | ICD-10-CM | POA: Diagnosis not present

## 2016-02-09 DIAGNOSIS — M50322 Other cervical disc degeneration at C5-C6 level: Secondary | ICD-10-CM | POA: Diagnosis not present

## 2016-02-22 ENCOUNTER — Ambulatory Visit: Payer: Medicare HMO | Admitting: Family Medicine

## 2016-02-26 DIAGNOSIS — J4522 Mild intermittent asthma with status asthmaticus: Secondary | ICD-10-CM | POA: Diagnosis not present

## 2016-03-02 ENCOUNTER — Other Ambulatory Visit: Payer: Self-pay

## 2016-03-02 MED ORDER — ESOMEPRAZOLE MAGNESIUM 40 MG PO CPDR: DELAYED_RELEASE_CAPSULE | ORAL | 3 refills | Status: DC

## 2016-03-28 DIAGNOSIS — J4522 Mild intermittent asthma with status asthmaticus: Secondary | ICD-10-CM | POA: Diagnosis not present

## 2016-04-11 ENCOUNTER — Other Ambulatory Visit: Payer: Self-pay

## 2016-04-11 MED ORDER — FLUOXETINE HCL 20 MG PO TABS: 80.0000 mg | ORAL_TABLET | Freq: Every day | ORAL | 0 refills | Status: DC

## 2016-04-18 ENCOUNTER — Encounter: Payer: Self-pay | Admitting: Family Medicine

## 2016-04-18 ENCOUNTER — Ambulatory Visit (INDEPENDENT_AMBULATORY_CARE_PROVIDER_SITE_OTHER): Payer: Medicare HMO | Admitting: Family Medicine

## 2016-04-18 VITALS — BP 130/84 | HR 88 | Temp 98.5°F | Resp 16 | Ht 64.5 in | Wt 184.2 lb

## 2016-04-18 DIAGNOSIS — Z1231 Encounter for screening mammogram for malignant neoplasm of breast: Secondary | ICD-10-CM | POA: Diagnosis not present

## 2016-04-18 DIAGNOSIS — E7800 Pure hypercholesterolemia, unspecified: Secondary | ICD-10-CM | POA: Insufficient documentation

## 2016-04-18 DIAGNOSIS — J0101 Acute recurrent maxillary sinusitis: Secondary | ICD-10-CM | POA: Diagnosis not present

## 2016-04-18 DIAGNOSIS — E1142 Type 2 diabetes mellitus with diabetic polyneuropathy: Secondary | ICD-10-CM

## 2016-04-18 DIAGNOSIS — Z111 Encounter for screening for respiratory tuberculosis: Secondary | ICD-10-CM

## 2016-04-18 DIAGNOSIS — I10 Essential (primary) hypertension: Secondary | ICD-10-CM | POA: Diagnosis not present

## 2016-04-18 LAB — POCT URINALYSIS DIP (MANUAL ENTRY)
Bilirubin, UA: NEGATIVE
Glucose, UA: NEGATIVE
Ketones, POC UA: NEGATIVE
Leukocytes, UA: NEGATIVE
NITRITE UA: NEGATIVE
PH UA: 5.5
Protein Ur, POC: NEGATIVE
RBC UA: NEGATIVE
Spec Grav, UA: <=1.005
UROBILINOGEN UA: 0.2

## 2016-04-18 MED ORDER — AMOXICILLIN-POT CLAVULANATE 875-125 MG PO TABS: 1.0000 | ORAL_TABLET | Freq: Two times a day (BID) | ORAL | 0 refills | Status: DC

## 2016-04-18 MED ORDER — PREDNISONE 20 MG PO TABS: ORAL_TABLET | ORAL | 0 refills | Status: DC

## 2016-04-18 NOTE — Progress Notes (Signed)
Subjective:    Patient ID: Dana Romero, female    DOB: 09/19/1950, 65 y.o.   MRN: [id number]  04/18/2016  Diabetes (depression scale during triage, score 10, pt will get exam in Jan 2018) and Hypertension   HPI This 65 y.o. female presents for three month follow-up of DMII with PN, hypertension, hypercholesterolemia, anxiety/depression.  Increased Prozac to 80mg  daily; recommended weaning Xanax due to opiate use; no further refills of Xanax or Temazepam will be provided.  S/p flu vaccine and Pneumovax at last visit with local reaction to Pneumovax.  Stopped Januvia; having low sugars.  Followed by nephrology who recommended stopping Januvia in the past yet not recently.  Has gained weight this year.  Mother passed away this year; had been together for 23 years; bad living situation; his family questions benefit to pt; caregiver to husband/boyfriend.  Stopped Gabapentin due to myalgias; did not help sleep.  Xanax really relaxes patient; Valium climbs the walls.    Head and chest congestion: onset one week ago.  Breathing treatments, taking Robitussin for cough. Allegra-D, Mucinex.  Always gets sick around the holidays. Feeling really fatigued.  Nothing working well.  Started feeling better; taking all medication known to take.  Excessive coughing with sore ribs.    Wt Readings from Last 3 Encounters:  04/18/16 184 lb 3.2 oz (83.6 kg)  01/18/16 178 lb (80.7 kg)  11/30/15 183 lb (83 kg)   BP Readings from Last 3 Encounters:  04/18/16 130/84  01/19/16 116/70  01/18/16 126/80   Immunization History  Administered Date(s) Administered  . Influenza Split 01/17/2012  . Influenza Whole 02/21/2006  . Influenza,inj,Quad PF,36+ Mos 04/12/2015, 01/18/2016  . PPD Test 01/12/2012, 04/24/2014, 04/12/2015, 04/18/2016  . Pneumococcal Conjugate-13 11/25/2014  . Pneumococcal Polysaccharide-23 08/21/2005, 01/18/2016  . Td 05/08/1998  . Tdap 05/13/2010    Review of Systems  Constitutional: Negative  for chills, diaphoresis, fatigue and fever.  HENT: Positive for congestion, postnasal drip and rhinorrhea.   Eyes: Negative for visual disturbance.  Respiratory: Positive for cough and wheezing. Negative for shortness of breath.   Cardiovascular: Negative for chest pain, palpitations and leg swelling.  Gastrointestinal: Negative for abdominal pain, constipation, diarrhea, nausea and vomiting.  Endocrine: Negative for cold intolerance, heat intolerance, polydipsia, polyphagia and polyuria.  Musculoskeletal: Positive for back pain.  Neurological: Negative for dizziness, tremors, seizures, syncope, facial asymmetry, speech difficulty, weakness, light-headedness, numbness and headaches.  Psychiatric/Behavioral: Positive for dysphoric mood. The patient is nervous/anxious.     Past Medical History:  Diagnosis Date  . Allergy    allergy eyes  . Anginal pain (HCC)    chest pain -usually anxiety related- Nitrogycerin helpful.  . Anxiety   . Asthma   . Chronic kidney disease    PER PT, Dr. Allena KatzPatel follows every 6 months  . Chronic pain syndrome    DDD cervical spine; managed by Ramos.  Daily narcotics.  . Degenerative disc disease, cervical    followed by Ramos; maintained on daily narcotics.  . Depression   . Diabetes mellitus 1998   off meds since 2009 or 2010  . GERD (gastroesophageal reflux disease)   . Gout   . Hypertension 1998  . Sinusitis, chronic   . Transfusion history    7 yrs ago - time of hospital visit Pancreatitis   Past Surgical History:  Procedure Laterality Date  . APPENDECTOMY    . ARM DEBRIDEMENT Left    debridement of axilla "spider bite"  . BREAST SURGERY  breast cyst x2 -last '12  . CHOLECYSTECTOMY N/A 08/11/2015   Procedure: LAPAROSCOPIC CHOLECYSTECTOMY;  Surgeon: De BlanchLuke Aaron Kinsinger, MD;  Location: [facility];  Service: General;  Laterality: N/A;  . TONSILLECTOMY    . TONSILLECTOMY AND ADENOIDECTOMY    . TUBAL LIGATION     post partum   Allergies    Allergen Reactions  . Amlodipine Besylate     REACTION: intolerance  . Atorvastatin     REACTION: intolerance  . Ezetimibe-Simvastatin     REACTION: intolerance  . Losartan Potassium-Hctz     Pt is unsure   . Morphine And Related Other (See Comments)    MIGRAINE  . Niacin     REACTION: intolerance  . Rosuvastatin     REACTION: intolerance  . Toprol Xl [Metoprolol Succinate] Other (See Comments)    Makes her feel weak  . Valium [Diazepam]     Has reverse effect on patient   . Latex Rash    Social History   Social History  . Marital status: Married    Spouse name: N/A  . Number of children: N/A  . 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 Smoking status: Never Smoker  . Smokeless tobacco: Never Used  . Alcohol use No     Comment: none in many years -moderate use previous  . Drug use: No  . Sexual activity: Not Currently    Partners: Male   Other Topics Concern  . Not on file   Social History Narrative  . No narrative on file   Family History  Problem Relation Age of Onset  . Hypertension Mother   . Depression Mother   . Cancer Mother 287    stomach cancer  . Diabetes Maternal Grandmother   . Hypertension Maternal Grandmother   . Breast cancer Maternal Grandmother   . Heart attack Maternal Grandmother   . Hypertension Brother   . Stroke Other   . Diabetes Maternal Aunt   . Breast cancer Maternal Aunt   . Diabetes Maternal Uncle   . Hypertension Maternal Uncle   . Hypertension Maternal Uncle        Objective:    BP 130/84   Pulse 88   Temp 98.5 F (36.9 C) (Oral)   Resp 16   Ht 5' 4.5" (1.638 m)   Wt 184 lb 3.2 oz (83.6 kg)   LMP 03/15/2014   SpO2 98%   BMI 31.13 kg/m  Physical Exam  Constitutional: She is oriented to person, place, and time. She appears well-developed and well-nourished. No distress.  HENT:  Head: Normocephalic and atraumatic.  Right Ear:  External ear normal.  Left Ear: External ear normal.  Nose: Nose normal.  Mouth/Throat: Oropharynx is clear and moist.  Eyes: Conjunctivae and EOM are normal. Pupils are equal, round, and reactive to light.  Neck: Normal range of motion. Neck supple. Carotid bruit is not present. No thyromegaly present.  Cardiovascular: Normal rate, regular rhythm, normal heart sounds and intact distal pulses.  Exam reveals no gallop and no friction rub.   No murmur heard. Pulmonary/Chest: Effort normal and breath sounds normal. She has no wheezes. She has no rales.  Abdominal: Soft. Bowel sounds are normal. She exhibits no distension and no mass. There is no tenderness. There is no rebound and no guarding.  Lymphadenopathy:    She has no cervical adenopathy.  Neurological: She is alert and oriented to  person, place, and time. No cranial nerve deficit.  Skin: Skin is warm and dry. No rash noted. She is not diaphoretic. No erythema. No pallor.  Psychiatric: She has a normal mood and affect. Her behavior is normal.   Results for orders placed or performed in visit on 04/18/16  CBC with Differential/Platelet  Result Value Ref Range   WBC 7.6 3.4 - 10.8 x10E3/uL   RBC 4.96 3.77 - 5.28 x10E6/uL   Hemoglobin 13.1 11.1 - 15.9 g/dL   Hematocrit 16.1 09.6 - 46.6 %   MCV 83 79 - 97 fL   MCH 26.4 (L) 26.6 - 33.0 pg   MCHC 31.9 31.5 - 35.7 g/dL   RDW 04.5 40.9 - 81.1 %   Platelets 333 150 - 379 x10E3/uL   Neutrophils 60 Not Estab. %   Lymphs 26 Not Estab. %   Monocytes 8 Not Estab. %   Eos 5 Not Estab. %   Basos 1 Not Estab. %   Neutrophils Absolute 4.5 1.4 - 7.0 x10E3/uL   Lymphocytes Absolute 2.0 0.7 - 3.1 x10E3/uL   Monocytes Absolute 0.6 0.1 - 0.9 x10E3/uL   EOS (ABSOLUTE) 0.4 0.0 - 0.4 x10E3/uL   Basophils Absolute 0.0 0.0 - 0.2 x10E3/uL   Immature Granulocytes 0 Not Estab. %   Immature Grans (Abs) 0.0 0.0 - 0.1 x10E3/uL  Comprehensive metabolic panel  Result Value Ref Range   Glucose 142 (H) 65 -  99 mg/dL   BUN 26 8 - 27 mg/dL   Creatinine, Ser 9.14 (H) 0.57 - 1.00 mg/dL   GFR calc non Af Amer 41 (L) >59 mL/min/1.73   GFR calc Af Amer 47 (L) >59 mL/min/1.73   BUN/Creatinine Ratio 19 12 - 28   Sodium 138 134 - 144 mmol/L   Potassium 5.3 (H) 3.5 - 5.2 mmol/L   Chloride 97 96 - 106 mmol/L   CO2 23 18 - 29 mmol/L   Calcium 9.4 8.7 - 10.3 mg/dL   Total Protein 7.0 6.0 - 8.5 g/dL   Albumin 4.3 3.6 - 4.8 g/dL   Globulin, Total 2.7 1.5 - 4.5 g/dL   Albumin/Globulin Ratio 1.6 1.2 - 2.2   Bilirubin Total <0.2 0.0 - 1.2 mg/dL   Alkaline Phosphatase 72 39 - 117 IU/L   AST 23 0 - 40 IU/L   ALT 22 0 - 32 IU/L  Hemoglobin A1c  Result Value Ref Range   Hgb A1c MFr Bld 8.1 (H) 4.8 - 5.6 %   Est. average glucose Bld gHb Est-mCnc 186 mg/dL  Microalbumin, urine  Result Value Ref Range   Albumin, Urine 6.5 Not Estab. ug/mL  POCT urinalysis dipstick  Result Value Ref Range   Color, UA yellow yellow   Clarity, UA cloudy (A) clear   Glucose, UA negative negative   Bilirubin, UA negative negative   Ketones, POC UA negative negative   Spec Grav, UA <=1.005    Blood, UA negative negative   pH, UA 5.5    Protein Ur, POC negative negative   Urobilinogen, UA 0.2    Nitrite, UA Negative Negative   Leukocytes, UA Negative Negative  TB Skin Test  Result Value Ref Range   TB Skin Test Negative    Induration 0 mm   Depression screen [facility] 2/9 04/18/2016 01/19/2016 11/30/2015 09/03/2015 04/12/2015  Decreased Interest 1 0 0 0 1  Down, Depressed, Hopeless 2 0 0 0 2  PHQ - 2 Score 3 0 0 0 3  Altered sleeping 1 - - -  1  Tired, decreased energy 3 - - - 3  Change in appetite 0 - - - 0  Feeling bad or failure about yourself  1 - - - 2  Trouble concentrating 1 - - - 1  Moving slowly or fidgety/restless 1 - - - 0  Suicidal thoughts 0 - - - 0  PHQ-9 Score 10 - - - 10  Difficult doing work/chores Somewhat difficult - - - -   Fall Risk  01/19/2016 11/30/2015 09/03/2015 04/12/2015 04/24/2014  Falls in the  past year? No No Yes Yes Yes  Number falls in past yr: - - 1 2 or more 1  Injury with Fall? - - Yes Yes -  Risk Factor Category  - - High Fall Risk - -  Follow up - - Falls evaluation completed - -       Assessment & Plan:   1. Controlled type 2 diabetes mellitus with diabetic polyneuropathy, without long-term current use of insulin (HCC)   2. Essential hypertension   3. Pure hypercholesterolemia   4. Acute recurrent maxillary sinusitis   5. Encounter for screening mammogram for breast cancer   6. Screening examination for pulmonary tuberculosis    -rx for Augmentin and Prednisone for sinusitis. -overdue for mammogram; refer again. -needs Tb screening for employment; PPD placed; RTC 48 hours for read. -obtain labs. -diabetes is uncontrolled and worsening.  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   Orders Placed This Encounter  Procedures  . MM SCREENING BREAST TOMO BILATERAL    PF 08/30/09 BCG    Standing Status:   Future    Standing Expiration Date:   06/19/2017    Order Specific Question:   Reason for Exam (SYMPTOM  OR DIAGNOSIS REQUIRED)    Answer:   annual    Order Specific Question:   Preferred imaging location?    Answer:   [facility]  . CBC with Differential/Platelet  . Comprehensive metabolic panel  . Hemoglobin A1c  . Microalbumin, urine  . POCT urinalysis dipstick  . TB Skin Test    Order Specific Question:   Has patient ever tested positive?    Answer:   No   Meds ordered this encounter  Medications  . amoxicillin-clavulanate (AUGMENTIN) 875-125 MG tablet    Sig: Take 1 tablet by mouth 2 (two) times daily.    Dispense:  20 tablet    Refill:  0  . predniSONE (DELTASONE) 20 MG tablet    Sig: Take 3 PO QAM x 1 day, 2 PO QAM x 5 days, 1 PO QAM x 5 days    Dispense:  18 tablet    Refill:  0    Return in about 3 months (around 07/17/2016) for recheck diabetes, high blood  pressure.   Moya Duan Paulita Fujita, M.D. [facility] [address] [place], [place]  [address] [phone] phone [phone] fax

## 2016-04-18 NOTE — Patient Instructions (Signed)
     IF you received an x-ray today, you will receive an invoice from [facility]. Please contact  Radiology at [phone] with questions or concerns regarding your invoice.   IF you received labwork today, you will receive an invoice from [facility]. Please contact [facility] at [phone] with questions or concerns regarding your invoice.   Our billing staff will not be able to assist you with questions regarding bills from these companies.  You will be contacted with the lab results as soon as they are available. The fastest way to get your results is to activate your My Chart account. Instructions are located on the last page of this paperwork. If you have not heard from us regarding the results in 2 weeks, please contact this office.      

## 2016-04-19 LAB — COMPREHENSIVE METABOLIC PANEL
ALK PHOS: 72 IU/L (ref 39–117)
ALT: 22 IU/L (ref 0–32)
AST: 23 IU/L (ref 0–40)
Albumin/Globulin Ratio: 1.6 (ref 1.2–2.2)
Albumin: 4.3 g/dL (ref 3.6–4.8)
BUN/Creatinine Ratio: 19 (ref 12–28)
BUN: 26 mg/dL (ref 8–27)
CHLORIDE: 97 mmol/L (ref 96–106)
CO2: 23 mmol/L (ref 18–29)
CREATININE: 1.36 mg/dL — AB (ref 0.57–1.00)
Calcium: 9.4 mg/dL (ref 8.7–10.3)
GFR calc Af Amer: 47 mL/min/{1.73_m2} — ABNORMAL LOW (ref >59)
GFR calc non Af Amer: 41 mL/min/{1.73_m2} — ABNORMAL LOW (ref >59)
GLOBULIN, TOTAL: 2.7 g/dL (ref 1.5–4.5)
GLUCOSE: 142 mg/dL — AB (ref 65–99)
Potassium: 5.3 mmol/L — ABNORMAL HIGH (ref 3.5–5.2)
SODIUM: 138 mmol/L (ref 134–144)
Total Protein: 7 g/dL (ref 6.0–8.5)

## 2016-04-19 LAB — CBC WITH DIFFERENTIAL/PLATELET
BASOS ABS: 0 10*3/uL (ref 0.0–0.2)
Basos: 1 %
EOS (ABSOLUTE): 0.4 10*3/uL (ref 0.0–0.4)
Eos: 5 %
Hematocrit: 41.1 % (ref 34.0–46.6)
Hemoglobin: 13.1 g/dL (ref 11.1–15.9)
Immature Grans (Abs): 0 10*3/uL (ref 0.0–0.1)
Immature Granulocytes: 0 %
Lymphocytes Absolute: 2 10*3/uL (ref 0.7–3.1)
Lymphs: 26 %
MCH: 26.4 pg — ABNORMAL LOW (ref 26.6–33.0)
MCHC: 31.9 g/dL (ref 31.5–35.7)
MCV: 83 fL (ref 79–97)
MONOS ABS: 0.6 10*3/uL (ref 0.1–0.9)
Monocytes: 8 %
Neutrophils Absolute: 4.5 10*3/uL (ref 1.4–7.0)
Neutrophils: 60 %
Platelets: 333 10*3/uL (ref 150–379)
RBC: 4.96 x10E6/uL (ref 3.77–5.28)
RDW: 15 % (ref 12.3–15.4)
WBC: 7.6 10*3/uL (ref 3.4–10.8)

## 2016-04-19 LAB — MICROALBUMIN, URINE: Microalbumin, Urine: 6.5 ug/mL

## 2016-04-19 LAB — HEMOGLOBIN A1C
ESTIMATED AVERAGE GLUCOSE: 186 mg/dL
HEMOGLOBIN A1C: 8.1 % — AB (ref 4.8–5.6)

## 2016-04-20 ENCOUNTER — Ambulatory Visit (INDEPENDENT_AMBULATORY_CARE_PROVIDER_SITE_OTHER): Payer: Medicare HMO | Admitting: Physician Assistant

## 2016-04-20 DIAGNOSIS — Z111 Encounter for screening for respiratory tuberculosis: Secondary | ICD-10-CM

## 2016-04-20 LAB — TB SKIN TEST
Induration: 0 mm
TB Skin Test: NEGATIVE

## 2016-04-27 ENCOUNTER — Other Ambulatory Visit: Payer: Self-pay

## 2016-04-27 DIAGNOSIS — J4522 Mild intermittent asthma with status asthmaticus: Secondary | ICD-10-CM | POA: Diagnosis not present

## 2016-04-27 NOTE — Telephone Encounter (Signed)
01/2016 last refill  12.2017 last ov 

## 2016-04-27 NOTE — Telephone Encounter (Signed)
Patient was seen by Katrinka BlazingSmith last week and forgot to mention that she needs a refill on her Xanax medication.  [facility] [address]  Please advise  Patient [phone] Pharmacy phone:[phone]

## 2016-04-28 ENCOUNTER — Other Ambulatory Visit: Payer: Self-pay | Admitting: Family Medicine

## 2016-04-28 NOTE — Telephone Encounter (Signed)
Refill for Xanax DENIED.  Please advise pt that as discussed at recent visits that she cannot take Xanax or Temazepam while also being maintained on Hydrocodone.  Also, I see that she has been receiving BOTH Xanax and Temazepam from Dr. Clarene DukeLittle for the past six months; thus, this further confirms that I will not be prescribed Xanax or Temazepam for her going further.

## 2016-04-28 NOTE — Telephone Encounter (Signed)
LMOVM Dr. Katrinka BlazingSmith unable to refill Xanax.  Gave most of Dr. Michaelle CopasSmith's message.

## 2016-05-02 NOTE — Telephone Encounter (Signed)
Called and [facility] to give her message below and explain why Dr Katrinka BlazingSmith needs to make sure pt has negative mammogram in order to continue the hormone Rxs. Also make sure that the pt has not gotten a mammogram somewhere else in the last 4 yrs that we can not see in EPIC.

## 2016-05-02 NOTE — Telephone Encounter (Signed)
Refill of Provera denied because patient has not undergone mammogram in several years. Please call pharmacy and discontinue Vivelle Dot as well. Then, call pt and advise that I cannot refill hormones until she undergoes mammogram.

## 2016-06-14 DIAGNOSIS — I2510 Atherosclerotic heart disease of native coronary artery without angina pectoris: Secondary | ICD-10-CM | POA: Diagnosis not present

## 2016-06-14 DIAGNOSIS — R69 Illness, unspecified: Secondary | ICD-10-CM | POA: Diagnosis not present

## 2016-06-14 DIAGNOSIS — M791 Myalgia: Secondary | ICD-10-CM | POA: Diagnosis not present

## 2016-06-29 DIAGNOSIS — Z79891 Long term (current) use of opiate analgesic: Secondary | ICD-10-CM | POA: Diagnosis not present

## 2016-06-29 DIAGNOSIS — M542 Cervicalgia: Secondary | ICD-10-CM | POA: Diagnosis not present

## 2016-06-29 DIAGNOSIS — M50322 Other cervical disc degeneration at C5-C6 level: Secondary | ICD-10-CM | POA: Diagnosis not present

## 2016-06-29 DIAGNOSIS — G894 Chronic pain syndrome: Secondary | ICD-10-CM | POA: Diagnosis not present

## 2016-07-18 ENCOUNTER — Ambulatory Visit: Payer: Medicare HMO | Admitting: Family Medicine

## 2016-07-19 NOTE — Progress Notes (Signed)
Tb reading

## 2016-08-09 DIAGNOSIS — R69 Illness, unspecified: Secondary | ICD-10-CM | POA: Diagnosis not present

## 2016-08-09 DIAGNOSIS — J302 Other seasonal allergic rhinitis: Secondary | ICD-10-CM | POA: Diagnosis not present

## 2016-11-06 DIAGNOSIS — M62838 Other muscle spasm: Secondary | ICD-10-CM | POA: Diagnosis not present

## 2016-12-26 DIAGNOSIS — I10 Essential (primary) hypertension: Secondary | ICD-10-CM | POA: Diagnosis not present

## 2016-12-26 DIAGNOSIS — J040 Acute laryngitis: Secondary | ICD-10-CM | POA: Diagnosis not present

## 2017-01-25 ENCOUNTER — Other Ambulatory Visit: Payer: Self-pay | Admitting: Family Medicine

## 2017-01-29 ENCOUNTER — Other Ambulatory Visit: Payer: Self-pay | Admitting: Family Medicine

## 2017-01-29 NOTE — Telephone Encounter (Signed)
Please advise. I didn't see this medication on the list.

## 2017-03-08 DIAGNOSIS — M50322 Other cervical disc degeneration at C5-C6 level: Secondary | ICD-10-CM | POA: Diagnosis not present

## 2017-03-08 DIAGNOSIS — G894 Chronic pain syndrome: Secondary | ICD-10-CM | POA: Diagnosis not present

## 2017-03-08 DIAGNOSIS — Z79891 Long term (current) use of opiate analgesic: Secondary | ICD-10-CM | POA: Diagnosis not present

## 2017-03-08 DIAGNOSIS — M7918 Myalgia, other site: Secondary | ICD-10-CM | POA: Diagnosis not present

## 2017-03-08 DIAGNOSIS — M62838 Other muscle spasm: Secondary | ICD-10-CM | POA: Diagnosis not present

## 2017-03-12 ENCOUNTER — Other Ambulatory Visit: Payer: Self-pay | Admitting: Family Medicine

## 2017-03-23 ENCOUNTER — Other Ambulatory Visit: Payer: Self-pay | Admitting: Family Medicine

## 2017-04-02 ENCOUNTER — Other Ambulatory Visit: Payer: Self-pay | Admitting: Family Medicine

## 2017-04-05 NOTE — Telephone Encounter (Signed)
Call -- I approved a one month supply of Advair.  I have not seen her since 04/18/16.  Has she established with another doctor?  If not, she MUST be seen in the upcoming month for further refills.

## 2017-04-25 ENCOUNTER — Other Ambulatory Visit: Payer: Self-pay | Admitting: Family Medicine

## 2017-06-18 DIAGNOSIS — R69 Illness, unspecified: Secondary | ICD-10-CM | POA: Diagnosis not present

## 2017-06-18 DIAGNOSIS — I10 Essential (primary) hypertension: Secondary | ICD-10-CM | POA: Diagnosis not present

## 2017-07-02 ENCOUNTER — Other Ambulatory Visit: Payer: Self-pay | Admitting: Family Medicine

## 2017-07-05 DIAGNOSIS — M542 Cervicalgia: Secondary | ICD-10-CM | POA: Diagnosis not present

## 2017-07-05 DIAGNOSIS — G894 Chronic pain syndrome: Secondary | ICD-10-CM | POA: Diagnosis not present

## 2017-07-05 DIAGNOSIS — Z79891 Long term (current) use of opiate analgesic: Secondary | ICD-10-CM | POA: Diagnosis not present

## 2017-07-23 DIAGNOSIS — R69 Illness, unspecified: Secondary | ICD-10-CM | POA: Diagnosis not present

## 2017-07-23 DIAGNOSIS — I10 Essential (primary) hypertension: Secondary | ICD-10-CM | POA: Diagnosis not present

## 2017-08-02 DIAGNOSIS — J019 Acute sinusitis, unspecified: Secondary | ICD-10-CM | POA: Diagnosis not present

## 2017-08-02 DIAGNOSIS — R69 Illness, unspecified: Secondary | ICD-10-CM | POA: Diagnosis not present

## 2017-08-02 DIAGNOSIS — J45998 Other asthma: Secondary | ICD-10-CM | POA: Diagnosis not present

## 2017-10-02 ENCOUNTER — Encounter: Payer: Self-pay | Admitting: Family Medicine

## 2017-11-21 DIAGNOSIS — I10 Essential (primary) hypertension: Secondary | ICD-10-CM | POA: Diagnosis not present

## 2017-11-21 DIAGNOSIS — R69 Illness, unspecified: Secondary | ICD-10-CM | POA: Diagnosis not present

## 2017-11-30 DIAGNOSIS — M542 Cervicalgia: Secondary | ICD-10-CM | POA: Diagnosis not present

## 2017-11-30 DIAGNOSIS — G894 Chronic pain syndrome: Secondary | ICD-10-CM | POA: Diagnosis not present

## 2018-04-09 DIAGNOSIS — Z79899 Other long term (current) drug therapy: Secondary | ICD-10-CM | POA: Diagnosis not present

## 2018-04-09 DIAGNOSIS — M542 Cervicalgia: Secondary | ICD-10-CM | POA: Diagnosis not present

## 2018-04-09 DIAGNOSIS — Z79891 Long term (current) use of opiate analgesic: Secondary | ICD-10-CM | POA: Diagnosis not present

## 2018-04-09 DIAGNOSIS — G894 Chronic pain syndrome: Secondary | ICD-10-CM | POA: Diagnosis not present

## 2018-04-09 DIAGNOSIS — Z5181 Encounter for therapeutic drug level monitoring: Secondary | ICD-10-CM | POA: Diagnosis not present

## 2018-04-09 DIAGNOSIS — I10 Essential (primary) hypertension: Secondary | ICD-10-CM | POA: Diagnosis not present

## 2018-04-09 DIAGNOSIS — M5030 Other cervical disc degeneration, unspecified cervical region: Secondary | ICD-10-CM | POA: Diagnosis not present

## 2018-06-04 DIAGNOSIS — R1084 Generalized abdominal pain: Secondary | ICD-10-CM | POA: Diagnosis not present

## 2018-06-04 DIAGNOSIS — Z79899 Other long term (current) drug therapy: Secondary | ICD-10-CM | POA: Diagnosis not present

## 2018-06-04 DIAGNOSIS — R112 Nausea with vomiting, unspecified: Secondary | ICD-10-CM | POA: Diagnosis not present

## 2018-06-04 DIAGNOSIS — Z7982 Long term (current) use of aspirin: Secondary | ICD-10-CM | POA: Diagnosis not present

## 2018-06-04 DIAGNOSIS — E860 Dehydration: Secondary | ICD-10-CM | POA: Diagnosis not present

## 2018-06-05 DIAGNOSIS — I10 Essential (primary) hypertension: Secondary | ICD-10-CM | POA: Diagnosis not present

## 2018-06-05 DIAGNOSIS — Z1389 Encounter for screening for other disorder: Secondary | ICD-10-CM | POA: Diagnosis not present

## 2018-06-05 DIAGNOSIS — R69 Illness, unspecified: Secondary | ICD-10-CM | POA: Diagnosis not present

## 2018-07-01 DIAGNOSIS — R05 Cough: Secondary | ICD-10-CM | POA: Diagnosis not present

## 2018-07-01 DIAGNOSIS — J0140 Acute pansinusitis, unspecified: Secondary | ICD-10-CM | POA: Diagnosis not present

## 2018-07-01 DIAGNOSIS — R509 Fever, unspecified: Secondary | ICD-10-CM | POA: Diagnosis not present

## 2018-07-01 DIAGNOSIS — R630 Anorexia: Secondary | ICD-10-CM | POA: Diagnosis not present

## 2018-07-01 DIAGNOSIS — R062 Wheezing: Secondary | ICD-10-CM | POA: Diagnosis not present

## 2018-07-01 DIAGNOSIS — R0982 Postnasal drip: Secondary | ICD-10-CM | POA: Diagnosis not present

## 2018-07-01 DIAGNOSIS — H9203 Otalgia, bilateral: Secondary | ICD-10-CM | POA: Diagnosis not present

## 2018-07-01 DIAGNOSIS — R5383 Other fatigue: Secondary | ICD-10-CM | POA: Diagnosis not present

## 2018-07-01 DIAGNOSIS — H6693 Otitis media, unspecified, bilateral: Secondary | ICD-10-CM | POA: Diagnosis not present

## 2018-07-01 DIAGNOSIS — J029 Acute pharyngitis, unspecified: Secondary | ICD-10-CM | POA: Diagnosis not present

## 2018-09-19 DIAGNOSIS — M542 Cervicalgia: Secondary | ICD-10-CM | POA: Diagnosis not present

## 2018-09-19 DIAGNOSIS — G894 Chronic pain syndrome: Secondary | ICD-10-CM | POA: Diagnosis not present

## 2018-11-07 DIAGNOSIS — F329 Major depressive disorder, single episode, unspecified: Secondary | ICD-10-CM | POA: Diagnosis not present

## 2018-11-07 DIAGNOSIS — I10 Essential (primary) hypertension: Secondary | ICD-10-CM | POA: Diagnosis not present

## 2018-11-07 DIAGNOSIS — R69 Illness, unspecified: Secondary | ICD-10-CM | POA: Diagnosis not present

## 2018-11-07 DIAGNOSIS — Z0189 Encounter for other specified special examinations: Secondary | ICD-10-CM | POA: Diagnosis not present

## 2018-11-07 DIAGNOSIS — E119 Type 2 diabetes mellitus without complications: Secondary | ICD-10-CM | POA: Diagnosis not present

## 2018-11-07 DIAGNOSIS — E2839 Other primary ovarian failure: Secondary | ICD-10-CM | POA: Diagnosis not present

## 2018-11-07 DIAGNOSIS — Z79890 Hormone replacement therapy (postmenopausal): Secondary | ICD-10-CM | POA: Diagnosis not present

## 2018-11-07 DIAGNOSIS — I259 Chronic ischemic heart disease, unspecified: Secondary | ICD-10-CM | POA: Diagnosis not present

## 2018-11-07 DIAGNOSIS — Z0001 Encounter for general adult medical examination with abnormal findings: Secondary | ICD-10-CM | POA: Diagnosis not present

## 2018-11-13 DIAGNOSIS — R0982 Postnasal drip: Secondary | ICD-10-CM | POA: Diagnosis not present

## 2018-11-13 DIAGNOSIS — J324 Chronic pansinusitis: Secondary | ICD-10-CM | POA: Diagnosis not present

## 2018-11-13 DIAGNOSIS — I10 Essential (primary) hypertension: Secondary | ICD-10-CM | POA: Diagnosis not present

## 2018-11-13 DIAGNOSIS — E119 Type 2 diabetes mellitus without complications: Secondary | ICD-10-CM | POA: Diagnosis not present

## 2018-11-13 DIAGNOSIS — H9201 Otalgia, right ear: Secondary | ICD-10-CM | POA: Diagnosis not present

## 2018-11-13 DIAGNOSIS — H6691 Otitis media, unspecified, right ear: Secondary | ICD-10-CM | POA: Diagnosis not present

## 2018-11-20 DIAGNOSIS — I10 Essential (primary) hypertension: Secondary | ICD-10-CM | POA: Diagnosis not present

## 2018-12-16 DIAGNOSIS — R69 Illness, unspecified: Secondary | ICD-10-CM | POA: Diagnosis not present

## 2018-12-16 DIAGNOSIS — I10 Essential (primary) hypertension: Secondary | ICD-10-CM | POA: Diagnosis not present

## 2019-01-28 DIAGNOSIS — M62830 Muscle spasm of back: Secondary | ICD-10-CM | POA: Diagnosis not present

## 2019-01-28 DIAGNOSIS — G894 Chronic pain syndrome: Secondary | ICD-10-CM | POA: Diagnosis not present

## 2019-01-28 DIAGNOSIS — M5030 Other cervical disc degeneration, unspecified cervical region: Secondary | ICD-10-CM | POA: Diagnosis not present

## 2019-01-28 DIAGNOSIS — M542 Cervicalgia: Secondary | ICD-10-CM | POA: Diagnosis not present

## 2019-01-28 DIAGNOSIS — Z79899 Other long term (current) drug therapy: Secondary | ICD-10-CM | POA: Diagnosis not present

## 2019-01-28 DIAGNOSIS — Z79891 Long term (current) use of opiate analgesic: Secondary | ICD-10-CM | POA: Diagnosis not present

## 2019-02-03 DIAGNOSIS — I639 Cerebral infarction, unspecified: Secondary | ICD-10-CM | POA: Diagnosis not present

## 2019-02-03 DIAGNOSIS — R200 Anesthesia of skin: Secondary | ICD-10-CM | POA: Diagnosis not present

## 2019-02-03 DIAGNOSIS — F419 Anxiety disorder, unspecified: Secondary | ICD-10-CM | POA: Diagnosis not present

## 2019-02-03 DIAGNOSIS — R29705 NIHSS score 5: Secondary | ICD-10-CM | POA: Diagnosis not present

## 2019-02-03 DIAGNOSIS — R29810 Facial weakness: Secondary | ICD-10-CM | POA: Diagnosis not present

## 2019-02-03 DIAGNOSIS — I63412 Cerebral infarction due to embolism of left middle cerebral artery: Secondary | ICD-10-CM | POA: Diagnosis not present

## 2019-02-03 DIAGNOSIS — G8194 Hemiplegia, unspecified affecting left nondominant side: Secondary | ICD-10-CM | POA: Diagnosis not present

## 2019-02-03 DIAGNOSIS — I6389 Other cerebral infarction: Secondary | ICD-10-CM | POA: Diagnosis not present

## 2019-02-03 DIAGNOSIS — R69 Illness, unspecified: Secondary | ICD-10-CM | POA: Diagnosis not present

## 2019-02-03 DIAGNOSIS — R202 Paresthesia of skin: Secondary | ICD-10-CM | POA: Diagnosis not present

## 2019-02-03 DIAGNOSIS — E119 Type 2 diabetes mellitus without complications: Secondary | ICD-10-CM | POA: Diagnosis not present

## 2019-02-03 DIAGNOSIS — K449 Diaphragmatic hernia without obstruction or gangrene: Secondary | ICD-10-CM | POA: Diagnosis not present

## 2019-02-03 DIAGNOSIS — I6501 Occlusion and stenosis of right vertebral artery: Secondary | ICD-10-CM | POA: Diagnosis not present

## 2019-02-03 DIAGNOSIS — I10 Essential (primary) hypertension: Secondary | ICD-10-CM | POA: Diagnosis not present

## 2019-02-03 DIAGNOSIS — R531 Weakness: Secondary | ICD-10-CM | POA: Diagnosis not present

## 2019-02-04 DIAGNOSIS — E119 Type 2 diabetes mellitus without complications: Secondary | ICD-10-CM | POA: Diagnosis not present

## 2019-02-04 DIAGNOSIS — I639 Cerebral infarction, unspecified: Secondary | ICD-10-CM | POA: Diagnosis not present

## 2019-02-04 DIAGNOSIS — I6389 Other cerebral infarction: Secondary | ICD-10-CM | POA: Diagnosis not present

## 2019-02-07 DIAGNOSIS — Z6834 Body mass index (BMI) 34.0-34.9, adult: Secondary | ICD-10-CM | POA: Diagnosis not present

## 2019-02-07 DIAGNOSIS — N183 Chronic kidney disease, stage 3 unspecified: Secondary | ICD-10-CM | POA: Diagnosis not present

## 2019-02-07 DIAGNOSIS — Z23 Encounter for immunization: Secondary | ICD-10-CM | POA: Diagnosis not present

## 2019-02-07 DIAGNOSIS — I639 Cerebral infarction, unspecified: Secondary | ICD-10-CM | POA: Diagnosis not present

## 2019-02-07 DIAGNOSIS — Z299 Encounter for prophylactic measures, unspecified: Secondary | ICD-10-CM | POA: Diagnosis not present

## 2019-02-07 DIAGNOSIS — I10 Essential (primary) hypertension: Secondary | ICD-10-CM | POA: Diagnosis not present

## 2019-02-07 DIAGNOSIS — E1122 Type 2 diabetes mellitus with diabetic chronic kidney disease: Secondary | ICD-10-CM | POA: Diagnosis not present

## 2019-02-17 DIAGNOSIS — R2689 Other abnormalities of gait and mobility: Secondary | ICD-10-CM | POA: Diagnosis not present

## 2019-02-17 DIAGNOSIS — R202 Paresthesia of skin: Secondary | ICD-10-CM | POA: Diagnosis not present

## 2019-02-17 DIAGNOSIS — R531 Weakness: Secondary | ICD-10-CM | POA: Diagnosis not present

## 2019-02-17 DIAGNOSIS — Z8673 Personal history of transient ischemic attack (TIA), and cerebral infarction without residual deficits: Secondary | ICD-10-CM | POA: Diagnosis not present

## 2019-02-17 DIAGNOSIS — I639 Cerebral infarction, unspecified: Secondary | ICD-10-CM | POA: Diagnosis not present

## 2019-02-17 DIAGNOSIS — I10 Essential (primary) hypertension: Secondary | ICD-10-CM | POA: Diagnosis not present

## 2019-02-17 DIAGNOSIS — R262 Difficulty in walking, not elsewhere classified: Secondary | ICD-10-CM | POA: Diagnosis not present

## 2019-02-20 DIAGNOSIS — R2689 Other abnormalities of gait and mobility: Secondary | ICD-10-CM | POA: Diagnosis not present

## 2019-02-20 DIAGNOSIS — R531 Weakness: Secondary | ICD-10-CM | POA: Diagnosis not present

## 2019-02-20 DIAGNOSIS — R262 Difficulty in walking, not elsewhere classified: Secondary | ICD-10-CM | POA: Diagnosis not present

## 2019-02-20 DIAGNOSIS — Z8673 Personal history of transient ischemic attack (TIA), and cerebral infarction without residual deficits: Secondary | ICD-10-CM | POA: Diagnosis not present

## 2019-02-24 DIAGNOSIS — Z8673 Personal history of transient ischemic attack (TIA), and cerebral infarction without residual deficits: Secondary | ICD-10-CM | POA: Diagnosis not present

## 2019-02-24 DIAGNOSIS — R2689 Other abnormalities of gait and mobility: Secondary | ICD-10-CM | POA: Diagnosis not present

## 2019-02-24 DIAGNOSIS — R531 Weakness: Secondary | ICD-10-CM | POA: Diagnosis not present

## 2019-02-24 DIAGNOSIS — R262 Difficulty in walking, not elsewhere classified: Secondary | ICD-10-CM | POA: Diagnosis not present

## 2019-02-25 ENCOUNTER — Encounter: Payer: Self-pay | Admitting: Cardiovascular Disease

## 2019-02-27 DIAGNOSIS — R2689 Other abnormalities of gait and mobility: Secondary | ICD-10-CM | POA: Diagnosis not present

## 2019-02-27 DIAGNOSIS — R262 Difficulty in walking, not elsewhere classified: Secondary | ICD-10-CM | POA: Diagnosis not present

## 2019-02-27 DIAGNOSIS — R531 Weakness: Secondary | ICD-10-CM | POA: Diagnosis not present

## 2019-02-27 DIAGNOSIS — Z8673 Personal history of transient ischemic attack (TIA), and cerebral infarction without residual deficits: Secondary | ICD-10-CM | POA: Diagnosis not present

## 2019-03-20 ENCOUNTER — Ambulatory Visit: Payer: Medicare HMO | Admitting: Cardiovascular Disease

## 2019-03-25 DIAGNOSIS — I639 Cerebral infarction, unspecified: Secondary | ICD-10-CM | POA: Diagnosis not present

## 2019-03-25 DIAGNOSIS — Z6834 Body mass index (BMI) 34.0-34.9, adult: Secondary | ICD-10-CM | POA: Diagnosis not present

## 2019-03-25 DIAGNOSIS — I10 Essential (primary) hypertension: Secondary | ICD-10-CM | POA: Diagnosis not present

## 2019-03-25 DIAGNOSIS — Z299 Encounter for prophylactic measures, unspecified: Secondary | ICD-10-CM | POA: Diagnosis not present

## 2019-03-25 DIAGNOSIS — G629 Polyneuropathy, unspecified: Secondary | ICD-10-CM | POA: Diagnosis not present

## 2019-03-25 DIAGNOSIS — N183 Chronic kidney disease, stage 3 unspecified: Secondary | ICD-10-CM | POA: Diagnosis not present

## 2019-04-08 ENCOUNTER — Other Ambulatory Visit: Payer: Self-pay | Admitting: Physical Medicine and Rehabilitation

## 2019-04-08 DIAGNOSIS — Z1231 Encounter for screening mammogram for malignant neoplasm of breast: Secondary | ICD-10-CM

## 2019-04-11 ENCOUNTER — Encounter: Payer: Self-pay | Admitting: Neurology

## 2019-04-14 ENCOUNTER — Encounter: Payer: Self-pay | Admitting: *Deleted

## 2019-04-15 ENCOUNTER — Encounter: Payer: Self-pay | Admitting: *Deleted

## 2019-04-15 ENCOUNTER — Other Ambulatory Visit: Payer: Self-pay

## 2019-04-15 ENCOUNTER — Telehealth: Payer: Self-pay | Admitting: Cardiology

## 2019-04-15 ENCOUNTER — Ambulatory Visit: Payer: Medicare HMO | Admitting: Cardiology

## 2019-04-15 ENCOUNTER — Encounter: Payer: Self-pay | Admitting: Cardiology

## 2019-04-15 VITALS — BP 144/98 | HR 72 | Ht 64.0 in | Wt 198.0 lb

## 2019-04-15 DIAGNOSIS — Z8673 Personal history of transient ischemic attack (TIA), and cerebral infarction without residual deficits: Secondary | ICD-10-CM

## 2019-04-15 DIAGNOSIS — Z789 Other specified health status: Secondary | ICD-10-CM

## 2019-04-15 DIAGNOSIS — I10 Essential (primary) hypertension: Secondary | ICD-10-CM | POA: Diagnosis not present

## 2019-04-15 MED ORDER — EZETIMIBE 10 MG PO TABS: 10.0000 mg | ORAL_TABLET | Freq: Every day | ORAL | 1 refills | Status: DC

## 2019-04-15 NOTE — Progress Notes (Signed)
Cardiology Office Note  Date: 04/15/2019   ID: Dana Romero, DOB [birth year], MRN [id number]  PCP:  Monico Blitz, MD  Consulting Cardiologist:  Rozann Lesches, MD Electrophysiologist:  None   Chief Complaint  Patient presents with  . History of stroke    History of Present Illness: Dana Romero is a 68 y.o. female referred for cardiology consultation by Dr. Manuella Ghazi with history of stroke in September.  Records indicate acute right thalamic infarct that was managed medically, patient reportedly had rapid improvement in symptoms.  She was discharged on Plavix, has multistatin intolerance.  Lipids are outlined below.  She underwent outpatient rehabilitation for about 6 weeks.  She states that she feels back to normal except for some mild tingling in her left lower leg and ankle.  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  Brain MRA showed no intracranial arterial occlusion.  Brain MRI revealed an acute right thalamic infarct with moderate chronic small vessel ischemic disease.  I reviewed ECGs obtained in September during hospital stay showing sinus rhythm, rule out old inferior infarct pattern.  We went over her medications today.  She reports no intolerance on Plavix.  Antihypertensive regimen includes Cozaar and Coreg.  She states that her blood pressure trend has improved.  I reviewed her allergies/intolerances.  Past Medical History:  Diagnosis Date  . Anxiety   . Asthma   . Chronic pain syndrome   . Degenerative disc disease, cervical   . Depression   . Essential hypertension 1998  . GERD (gastroesophageal reflux disease)   . Gout   . History of stroke    September 2020  . Seasonal allergies   . Sinusitis, chronic   . Transfusion history   . Type 2 diabetes mellitus ([place]) 1998    Past Surgical History:  Procedure Laterality Date  . APPENDECTOMY    . ARM DEBRIDEMENT Left     debridement of axilla "spider bite"  . BREAST SURGERY     breast cyst x2 -last '12  . CHOLECYSTECTOMY N/A 08/11/2015   Procedure: LAPAROSCOPIC CHOLECYSTECTOMY;  Surgeon: Arta Bruce Kinsinger, MD;  Location: [facility];  Service: General;  Laterality: N/A;  . TONSILLECTOMY    . TONSILLECTOMY AND ADENOIDECTOMY    . TUBAL LIGATION     post partum    Current Outpatient Medications  Medication Sig Dispense Refill  . ADVAIR HFA 115-21 MCG/ACT inhaler INHALE 2 PUFFS INTO LUNGS TWICE DAILY 12 Inhaler 0  . albuterol (PROVENTIL) (2.5 MG/3ML) 0.083% nebulizer solution USE ONE VIAL IN NEBULIZER EVERY 6 HOURS AS NEEDED FOR WHEEZING OR SHORTNESS OF BREATH 75 mL 0  . albuterol (VENTOLIN HFA) 108 (90 Base) MCG/ACT inhaler INHALE ONE TO TWO PUFFS BY MOUTH EVERY 6 HOURS AS NEEDED FOR WHEEZING OR SHORTNESS OF BREATH (OR PERSISTENT COUGHING) 18 each 2  . Alcohol Swabs PADS Use to check blood sugar daily. E11.9 100 each 3  . amitriptyline (ELAVIL) 25 MG tablet Take 1 tablet (25 mg total) by mouth at bedtime. 30 tablet 0  . blood glucose meter kit and supplies KIT Dispense based on patient and insurance preference. Use once a day.  DX: E11.9. 1 each 0  . blood glucose meter kit and supplies Dispense based on patient and insurance preference. Use up to four times daily as directed. (FOR ICD-10 E11.9). 1 each 0  . Blood Glucose Monitoring Suppl (FREESTYLE LITE) DEVI     .  carvedilol (COREG) 25 MG tablet Take 1 tablet (25 mg total) by mouth 2 (two) times daily with a meal. 180 tablet 3  . cetirizine (ZYRTEC ALLERGY) 10 MG tablet Take 10 mg by mouth daily.    . Cinnamon 500 MG capsule Take 1,000 mg by mouth daily.    . clopidogrel (PLAVIX) 75 MG tablet Take 75 mg by mouth daily.    . colchicine 0.6 MG tablet Take 0.6 mg by mouth daily as needed (gout flare up).     . diphenhydrAMINE (BENADRYL) 25 mg capsule Take 25 mg by mouth at bedtime.    Marland Kitchen EPINEPHrine (EPIPEN 2-PAK) 0.3 mg/0.3 mL IJ SOAJ injection Inject 0.3 mLs  (0.3 mg total) into the muscle once. 1 Device 1  . esomeprazole (NEXIUM) 40 MG capsule TAKE ONE CAPSULE BY MOUTH EVERY DAY BEFORE BREAKFAST 90 capsule 0  . estradiol (VIVELLE-DOT) 0.05 MG/24HR patch Place 1 patch (0.05 mg total) onto the skin 2 (two) times a week. 8 patch prn  . FLUoxetine (PROZAC) 20 MG tablet Take 4 tablets (80 mg total) by mouth daily. 360 tablet 0  . glucose blood test strip Test blood sugar daily.  DX: E11.9 100 each 2  . glucose monitoring kit (FREESTYLE) monitoring kit Test blood sugar daily. DX: E11.9 1 each 0  . HYDROcodone-acetaminophen (NORCO) 10-325 MG per tablet Take 1 tablet by mouth every 6 (six) hours as needed for moderate pain or severe pain.     Marland Kitchen ketotifen (ALAWAY) 0.025 % ophthalmic solution Place 1 drop into both eyes 2 (two) times daily as needed (Allergies).    Elmore Guise Devices (LANCING DEVICE) MISC Use to test blood sugar daily. E11.9 1 each 0  . Lancets MISC Test blood sugar daily. DX: E11.9 100 each 2  . losartan (COZAAR) 100 MG tablet Take 1 tablet (100 mg total) by mouth daily. 90 tablet 1  . magnesium oxide (MAG-OX) 400 MG tablet Take 1 tablet (400 mg total) by mouth 2 (two) times daily. (Patient taking differently: Take 400 mg by mouth 3 (three) times daily. ) 60 tablet 0  . medroxyPROGESTERone (PROVERA) 2.5 MG tablet TAKE ONE TABLET BY MOUTH ONCE DAILY 90 tablet 0  . Multiple Vitamins-Minerals (MULTIVITAMIN ADULT PO) Take 1 tablet by mouth daily.    Marland Kitchen omega-3 acid ethyl esters (LOVAZA) 1 g capsule TAKE TWO CAPSULES BY MOUTH TWICE DAILY 360 capsule 0  . OVER THE COUNTER MEDICATION Take 1 tablet by mouth daily. Focus Factor: Brain Function Support    . promethazine (PHENERGAN) 12.5 MG tablet TAKE ONE TABLET BY MOUTH EVERY 8 HOURS AS NEEDED. NEED CHECK UP/LABS FOR ADDITIONAL REFILLS 30 tablet 0  . Propylene Glycol-Glycerin (SOOTHE OP) Apply 1 drop to eye daily.    . sitaGLIPtin (JANUVIA) 50 MG tablet Take 1 tablet (50 mg total) by mouth daily. 90 tablet  3  . thiamine (VITAMIN B-1) 100 MG tablet Take 100 mg by mouth daily.    Marland Kitchen triamcinolone (NASACORT) 55 MCG/ACT nasal inhaler Place 2 sprays into the nose daily. (Patient taking differently: Place 2 sprays into the nose daily as needed (allergies). ) 1 Inhaler 5  . vitamin C (ASCORBIC ACID) 500 MG tablet Take 500 mg by mouth daily.    . Vitamin D, Ergocalciferol, (DRISDOL) 50000 units CAPS capsule Take 50,000 Units by mouth every 7 (seven) days.    . vitamin E 400 UNIT capsule Take 400 Units by mouth daily.    Marland Kitchen ezetimibe (ZETIA) 10 MG tablet Take  1 tablet (10 mg total) by mouth daily. 90 tablet 1   No current facility-administered medications for this visit.    Allergies:  Amlodipine besylate, Atorvastatin, Ezetimibe-simvastatin, Losartan potassium-hctz, Morphine and related, Niacin, Rosuvastatin, Toprol xl [metoprolol succinate], Valium [diazepam], and Latex   Social History: The patient  reports that she has never smoked. She has never used smokeless tobacco. She reports that she does not drink alcohol or use drugs.   Family History: The patient's family history includes Breast cancer in her maternal aunt and maternal grandmother; Depression in her mother; Diabetes in her maternal aunt, maternal grandmother, and maternal uncle; Heart attack in her maternal grandmother; Hypertension in her brother, maternal grandmother, maternal uncle, maternal uncle, and mother; Stomach cancer (age of onset: 78) in her mother; Stroke in an other family member.   ROS:  Please see the history of present illness. Otherwise, complete review of systems is positive for none.  All other systems are reviewed and negative.   Physical Exam: VS:  BP (!) 144/98   Pulse 72   Ht 5' 4"  (1.626 m)   Wt 198 lb (89.8 kg)   LMP 03/15/2014   SpO2 98%   BMI 33.99 kg/m , BMI Body mass index is 33.99 kg/m.  Wt Readings from Last 3 Encounters:  04/15/19 198 lb (89.8 kg)  04/18/16 184 lb 3.2 oz (83.6 kg)  01/18/16 178 lb  (80.7 kg)    General: Patient appears comfortable at rest. HEENT: Conjunctiva and lids normal, wearing a mask. Neck: Supple, no elevated JVP or carotid bruits, no thyromegaly. Lungs: Clear to auscultation, nonlabored breathing at rest. Cardiac: Regular rate and rhythm, no S3 or significant systolic murmur, no pericardial rub. Abdomen: Soft, nontender, bowel sounds present. Extremities: No pitting edema, distal pulses 2+. Skin: Warm and dry. Musculoskeletal: No kyphosis. Neuropsychiatric: Alert and oriented x3, affect grossly appropriate.  ECG:  An ECG dated 08/05/2015 was personally reviewed today and demonstrated:  Normal sinus rhythm with left atrial enlargement.  Recent Labwork:  September 2020: BUN 21, creatinine 1.24 (GFR 43), potassium 4.3, AST 22, ALT 15, cholesterol 230, triglycerides 142, HDL 58, LDL 144, troponin T less than 0.01x4, NT-proBNP 235, TSH 1.27, hemoglobin 11.8, platelets 330  Other Studies Reviewed Today:  Echocardiogram 02/04/2019 [facility]): Summary 1. Normal left ventricular size and systolic function, ejection fraction > 55%. 2. Normal right ventricular size and systolic function. 3. Aortic sclerosis.  Recommendations * Continue medical therapy for diabetes.   Left Ventricle The left ventricle is normal in size with normal wall thickness. The left ventricular systolic function is normal, LVEF is visually estimated at > 55%. There is normal left ventricular diastolic function.  Right Ventricle The right ventricle is normal in size, with normal systolic function. Right ventricle wall thickness is normal.   Left Atria The left atrium is normal in size.  Right Atria The right atrium is normal in size.   Aortic Valve The aortic valve is trileaflet with mildly thickened leaflets with normal excursion. There is no significant aortic regurgitation by color flow and continuous wave Doppler imaging. There is no evidence of a significant  transvalvular gradient.  Pulmonic Valve The pulmonic valve is normal. There is no significant pulmonic regurgitation by color flow and continuous wave Doppler imaging. There is no evidence of a significant transvalvular gradient.  Mitral Valve The mitral valve leaflets are normal with normal leaflet mobility. There is no significant mitral valve regurgitation by color flow and continuous wave Doppler imaging.  Tricuspid  Valve The tricuspid valve leaflets are normal, with normal leaflet mobility. There is no significant tricuspid regurgitation by color flow and continuous wave Doppler imaging.   Other Findings Rhythm: Sinus Rhythm.  Pericardium/Pleural There is no pericardial effusion.  Inferior Vena Cava The IVC diameter is <=21 mm with >50% decrease in size with inspiration suggesting normal right atrial pressure (0-5 mm Hg).  Aorta The aortic root at the sinus of Valsalva is normal.   Assessment and Plan:  1.  History of acute right thalamic stroke in September as discussed above.  Patient feels nearly back to baseline and continues on Plavix at this time.  She has a multistatin intolerance (Crestor, Lipitor, Zocor) and we discussed trying Zetia for now.  She is also on Lovaza.  Echocardiogram showed normal LVEF and left atrial chamber size.  She had no obstructive carotid artery disease with right sided vertebral origin stenosis.  We will obtain a 14-day Zio patch to screen for unrecognized PAF.  2.  Essential hypertension, currently on Cozaar and Coreg.  Keep follow-up with Dr. Manuella Ghazi.  3.  Mixed hyperlipidemia, most recent LDL 144.  She has a history of multistatin intolerance as discussed above.  Will try Zetia (Vytorin listed as intolerance most likely related to simvastatin).  Medication Adjustments/Labs and Tests Ordered: Current medicines are reviewed at length with the patient today.  Concerns regarding medicines are outlined above.   Tests Ordered: Orders Placed  This Encounter  Procedures  . LONG TERM MONITOR (3-14 DAYS)    Medication Changes: Meds ordered this encounter  Medications  . ezetimibe (ZETIA) 10 MG tablet    Sig: Take 1 tablet (10 mg total) by mouth daily.    Dispense:  90 tablet    Refill:  1    04/15/2019 NEW    Disposition:  Follow up test results and determine next step.  Signed, Satira Sark, MD, [facility] 04/15/2019 11:24 AM    [facility], [place], [place] [address] Phone: [phone]; Fax: [phone]

## 2019-04-15 NOTE — Telephone Encounter (Signed)
Pre-cert Verification for the following procedure    14 Day ZIO AT dx: h/o stroke to r/o a-fib

## 2019-04-15 NOTE — Patient Instructions (Addendum)
Medication Instructions:   Your physician has recommended you make the following change in your medication:   Start zetia 10 mg by mouth daily  Continue other medications the same  Labwork:  NONE  Testing/Procedures: Your physician has recommended that you wear an event monitor for 14 days. Event monitors are medical devices that record the heart's electrical activity. Doctors most often [place] these monitors to diagnose arrhythmias. Arrhythmias are problems with the speed or rhythm of the heartbeat. The monitor is a small, portable device. You can wear one while you do your normal daily activities. This is usually used to diagnose what is causing palpitations/syncope (passing out). [employer] will send your ZIO monitor to your house  Follow-Up:  Your physician recommends that you schedule a follow-up appointment in: pending  Any Other Special Instructions Will Be Listed Below (If Applicable).  If you need a refill on your cardiac medications before your next appointment, please call your pharmacy.

## 2019-04-27 DIAGNOSIS — G464 Cerebellar stroke syndrome: Secondary | ICD-10-CM | POA: Diagnosis not present

## 2019-04-28 ENCOUNTER — Ambulatory Visit (INDEPENDENT_AMBULATORY_CARE_PROVIDER_SITE_OTHER): Payer: Medicare HMO

## 2019-04-28 DIAGNOSIS — G464 Cerebellar stroke syndrome: Secondary | ICD-10-CM | POA: Diagnosis not present

## 2019-04-28 DIAGNOSIS — Z8673 Personal history of transient ischemic attack (TIA), and cerebral infarction without residual deficits: Secondary | ICD-10-CM | POA: Diagnosis not present

## 2019-05-27 ENCOUNTER — Telehealth: Payer: Self-pay | Admitting: *Deleted

## 2019-05-27 ENCOUNTER — Ambulatory Visit: Payer: Medicare HMO | Admitting: Neurology

## 2019-05-27 NOTE — Telephone Encounter (Signed)
-----   Message from Jonelle Sidle, MD sent at 05/27/2019  2:58 PM EST ----- Results reviewed.  Monitor showed a few relatively brief episodes of SVT, but importantly there was no atrial fibrillation.  Continue with current medications.  Schedule a 54-month office visit.

## 2019-05-27 NOTE — Telephone Encounter (Signed)
Patient informed and copy sent to PCP. 

## 2019-06-02 DIAGNOSIS — E1165 Type 2 diabetes mellitus with hyperglycemia: Secondary | ICD-10-CM | POA: Diagnosis not present

## 2019-06-13 DIAGNOSIS — Z5181 Encounter for therapeutic drug level monitoring: Secondary | ICD-10-CM | POA: Diagnosis not present

## 2019-06-13 DIAGNOSIS — Z79899 Other long term (current) drug therapy: Secondary | ICD-10-CM | POA: Diagnosis not present

## 2019-06-13 DIAGNOSIS — G894 Chronic pain syndrome: Secondary | ICD-10-CM | POA: Diagnosis not present

## 2019-06-13 DIAGNOSIS — Z79891 Long term (current) use of opiate analgesic: Secondary | ICD-10-CM | POA: Diagnosis not present

## 2019-06-13 DIAGNOSIS — M5030 Other cervical disc degeneration, unspecified cervical region: Secondary | ICD-10-CM | POA: Diagnosis not present

## 2019-07-28 DIAGNOSIS — J45909 Unspecified asthma, uncomplicated: Secondary | ICD-10-CM | POA: Diagnosis not present

## 2019-07-28 DIAGNOSIS — R202 Paresthesia of skin: Secondary | ICD-10-CM | POA: Diagnosis not present

## 2019-07-28 DIAGNOSIS — I639 Cerebral infarction, unspecified: Secondary | ICD-10-CM | POA: Diagnosis not present

## 2019-07-28 DIAGNOSIS — G629 Polyneuropathy, unspecified: Secondary | ICD-10-CM | POA: Diagnosis not present

## 2019-07-28 DIAGNOSIS — R7309 Other abnormal glucose: Secondary | ICD-10-CM | POA: Diagnosis not present

## 2019-07-28 DIAGNOSIS — E119 Type 2 diabetes mellitus without complications: Secondary | ICD-10-CM | POA: Diagnosis not present

## 2019-07-28 DIAGNOSIS — Z79890 Hormone replacement therapy (postmenopausal): Secondary | ICD-10-CM | POA: Diagnosis not present

## 2019-07-28 DIAGNOSIS — R69 Illness, unspecified: Secondary | ICD-10-CM | POA: Diagnosis not present

## 2019-07-28 DIAGNOSIS — I10 Essential (primary) hypertension: Secondary | ICD-10-CM | POA: Diagnosis not present

## 2019-07-28 DIAGNOSIS — F329 Major depressive disorder, single episode, unspecified: Secondary | ICD-10-CM | POA: Diagnosis not present

## 2019-07-30 DIAGNOSIS — E1165 Type 2 diabetes mellitus with hyperglycemia: Secondary | ICD-10-CM | POA: Diagnosis not present

## 2019-11-24 ENCOUNTER — Ambulatory Visit: Payer: Medicare HMO | Admitting: Cardiology

## 2019-11-24 NOTE — Progress Notes (Deleted)
Cardiology Office Note  Date: 11/24/2019   ID: Dana Romero, DOB [birth year], MRN [id number]  PCP:  Monico Blitz, MD  Cardiologist:  Rozann Lesches, MD Electrophysiologist:  None   No chief complaint on file.   History of Present Illness: Dana Romero is a 69 y.o. female seen in consultation in December 2020.  Past Medical History:  Diagnosis Date  . Anxiety   . Asthma   . Chronic pain syndrome   . Degenerative disc disease, cervical   . Depression   . Essential hypertension 1998  . GERD (gastroesophageal reflux disease)   . Gout   . History of stroke    September 2020  . Seasonal allergies   . Sinusitis, chronic   . Transfusion history   . Type 2 diabetes mellitus ([place]) 1998    Past Surgical History:  Procedure Laterality Date  . APPENDECTOMY    . ARM DEBRIDEMENT Left    debridement of axilla "spider bite"  . BREAST SURGERY     breast cyst x2 -last '12  . CHOLECYSTECTOMY N/A 08/11/2015   Procedure: LAPAROSCOPIC CHOLECYSTECTOMY;  Surgeon: Arta Bruce Kinsinger, MD;  Location: [facility];  Service: General;  Laterality: N/A;  . TONSILLECTOMY    . TONSILLECTOMY AND ADENOIDECTOMY    . TUBAL LIGATION     post partum    Current Outpatient Medications  Medication Sig Dispense Refill  . ADVAIR HFA 115-21 MCG/ACT inhaler INHALE 2 PUFFS INTO LUNGS TWICE DAILY 12 Inhaler 0  . albuterol (PROVENTIL) (2.5 MG/3ML) 0.083% nebulizer solution USE ONE VIAL IN NEBULIZER EVERY 6 HOURS AS NEEDED FOR WHEEZING OR SHORTNESS OF BREATH 75 mL 0  . albuterol (VENTOLIN HFA) 108 (90 Base) MCG/ACT inhaler INHALE ONE TO TWO PUFFS BY MOUTH EVERY 6 HOURS AS NEEDED FOR WHEEZING OR SHORTNESS OF BREATH (OR PERSISTENT COUGHING) 18 each 2  . Alcohol Swabs PADS Use to check blood sugar daily. E11.9 100 each 3  . amitriptyline (ELAVIL) 25 MG tablet Take 1 tablet (25 mg total) by mouth at bedtime. 30 tablet 0  . blood glucose meter kit and supplies KIT Dispense based on patient and insurance  preference. Use once a day.  DX: E11.9. 1 each 0  . blood glucose meter kit and supplies Dispense based on patient and insurance preference. Use up to four times daily as directed. (FOR ICD-10 E11.9). 1 each 0  . Blood Glucose Monitoring Suppl (FREESTYLE LITE) DEVI     . carvedilol (COREG) 25 MG tablet Take 1 tablet (25 mg total) by mouth 2 (two) times daily with a meal. 180 tablet 3  . cetirizine (ZYRTEC ALLERGY) 10 MG tablet Take 10 mg by mouth daily.    . Cinnamon 500 MG capsule Take 1,000 mg by mouth daily.    . clopidogrel (PLAVIX) 75 MG tablet Take 75 mg by mouth daily.    . colchicine 0.6 MG tablet Take 0.6 mg by mouth daily as needed (gout flare up).     . diphenhydrAMINE (BENADRYL) 25 mg capsule Take 25 mg by mouth at bedtime.    Marland Kitchen EPINEPHrine (EPIPEN 2-PAK) 0.3 mg/0.3 mL IJ SOAJ injection Inject 0.3 mLs (0.3 mg total) into the muscle once. 1 Device 1  . esomeprazole (NEXIUM) 40 MG capsule TAKE ONE CAPSULE BY MOUTH EVERY DAY BEFORE BREAKFAST 90 capsule 0  . estradiol (VIVELLE-DOT) 0.05 MG/24HR patch Place 1 patch (0.05 mg total) onto the skin 2 (two) times a week. 8 patch prn  . ezetimibe (ZETIA)  10 MG tablet Take 1 tablet (10 mg total) by mouth daily. 90 tablet 1  . FLUoxetine (PROZAC) 20 MG tablet Take 4 tablets (80 mg total) by mouth daily. 360 tablet 0  . glucose blood test strip Test blood sugar daily.  DX: E11.9 100 each 2  . glucose monitoring kit (FREESTYLE) monitoring kit Test blood sugar daily. DX: E11.9 1 each 0  . HYDROcodone-acetaminophen (NORCO) 10-325 MG per tablet Take 1 tablet by mouth every 6 (six) hours as needed for moderate pain or severe pain.     Marland Kitchen ketotifen (ALAWAY) 0.025 % ophthalmic solution Place 1 drop into both eyes 2 (two) times daily as needed (Allergies).    Elmore Guise Devices (LANCING DEVICE) MISC Use to test blood sugar daily. E11.9 1 each 0  . Lancets MISC Test blood sugar daily. DX: E11.9 100 each 2  . losartan (COZAAR) 100 MG tablet Take 1 tablet (100  mg total) by mouth daily. 90 tablet 1  . magnesium oxide (MAG-OX) 400 MG tablet Take 1 tablet (400 mg total) by mouth 2 (two) times daily. (Patient taking differently: Take 400 mg by mouth 3 (three) times daily. ) 60 tablet 0  . medroxyPROGESTERone (PROVERA) 2.5 MG tablet TAKE ONE TABLET BY MOUTH ONCE DAILY 90 tablet 0  . Multiple Vitamins-Minerals (MULTIVITAMIN ADULT PO) Take 1 tablet by mouth daily.    Marland Kitchen omega-3 acid ethyl esters (LOVAZA) 1 g capsule TAKE TWO CAPSULES BY MOUTH TWICE DAILY 360 capsule 0  . OVER THE COUNTER MEDICATION Take 1 tablet by mouth daily. Focus Factor: Brain Function Support    . promethazine (PHENERGAN) 12.5 MG tablet TAKE ONE TABLET BY MOUTH EVERY 8 HOURS AS NEEDED. NEED CHECK UP/LABS FOR ADDITIONAL REFILLS 30 tablet 0  . Propylene Glycol-Glycerin (SOOTHE OP) Apply 1 drop to eye daily.    . sitaGLIPtin (JANUVIA) 50 MG tablet Take 1 tablet (50 mg total) by mouth daily. 90 tablet 3  . thiamine (VITAMIN B-1) 100 MG tablet Take 100 mg by mouth daily.    Marland Kitchen triamcinolone (NASACORT) 55 MCG/ACT nasal inhaler Place 2 sprays into the nose daily. (Patient taking differently: Place 2 sprays into the nose daily as needed (allergies). ) 1 Inhaler 5  . vitamin C (ASCORBIC ACID) 500 MG tablet Take 500 mg by mouth daily.    . Vitamin D, Ergocalciferol, (DRISDOL) 50000 units CAPS capsule Take 50,000 Units by mouth every 7 (seven) days.    . vitamin E 400 UNIT capsule Take 400 Units by mouth daily.     No current facility-administered medications for this visit.   Allergies:  Amlodipine besylate, Atorvastatin, Ezetimibe-simvastatin, Losartan potassium-hctz, Morphine and related, Niacin, Rosuvastatin, Toprol xl [metoprolol succinate], Valium [diazepam], and Latex   Social History: The patient  reports that she has never smoked. She has never used smokeless tobacco. She reports that she does not drink alcohol and does not use drugs.   Family History: The patient's family history includes  Breast cancer in her maternal aunt and maternal grandmother; Depression in her mother; Diabetes in her maternal aunt, maternal grandmother, and maternal uncle; Heart attack in her maternal grandmother; Hypertension in her brother, maternal grandmother, maternal uncle, maternal uncle, and mother; Stomach cancer (age of onset: 70) in her mother; Stroke in an other family member.   ROS:  Please see the history of present illness. Otherwise, complete review of systems is positive for {NONE DEFAULTED:18576::"none"}.  All other systems are reviewed and negative.   Physical Exam: VS:  LMP 03/15/2014 , BMI There is no height or weight on file to calculate BMI.  Wt Readings from Last 3 Encounters:  04/15/19 198 lb (89.8 kg)  04/18/16 184 lb 3.2 oz (83.6 kg)  01/18/16 178 lb (80.7 kg)    General: Patient appears comfortable at rest. HEENT: Conjunctiva and lids normal, oropharynx clear with moist mucosa. Neck: Supple, no elevated JVP or carotid bruits, no thyromegaly. Lungs: Clear to auscultation, nonlabored breathing at rest. Cardiac: Regular rate and rhythm, no S3 or significant systolic murmur, no pericardial rub. Abdomen: Soft, nontender, no hepatomegaly, bowel sounds present, no guarding or rebound. Extremities: No pitting edema, distal pulses 2+. Skin: Warm and dry. Musculoskeletal: No kyphosis. Neuropsychiatric: Alert and oriented x3, affect grossly appropriate.  ECG:  An ECG dated September 2020 was personally reviewed today and demonstrated:  Sinus rhythm, rule out old inferior infarct pattern.  Recent Labwork:  September 2020: BUN 21, creatinine 1.24 (GFR 43), potassium 4.3, AST 22, ALT 15, cholesterol 230, triglycerides 142, HDL 58, LDL 144, troponin T less than 0.01x4, NT-proBNP 235, TSH 1.27, hemoglobin 11.8, platelets 330  Other Studies Reviewed Today:  Echocardiogram 02/04/2019 [facility]): Summary 1. Normal left ventricular size and systolic function, ejection fraction  > 55%. 2. Normal right ventricular size and systolic function. 3. Aortic sclerosis.  Recommendations * Continue medical therapy for diabetes.   Left Ventricle The left ventricle is normal in size with normal wall thickness. The left ventricular systolic function is normal, LVEF is visually estimated at > 55%. There is normal left ventricular diastolic function.  Right Ventricle The right ventricle is normal in size, with normal systolic function. Right ventricle wall thickness is normal.   Left Atria The left atrium is normal in size.  Right Atria The right atrium is normal in size.   Aortic Valve The aortic valve is trileaflet with mildly thickened leaflets with normal excursion. There is no significant aortic regurgitation by color flow and continuous wave Doppler imaging. There is no evidence of a significant transvalvular gradient.  Pulmonic Valve The pulmonic valve is normal. There is no significant pulmonic regurgitation by color flow and continuous wave Doppler imaging. There is no evidence of a significant transvalvular gradient.  Mitral Valve The mitral valve leaflets are normal with normal leaflet mobility. There is no significant mitral valve regurgitation by color flow and continuous wave Doppler imaging.  Tricuspid Valve The tricuspid valve leaflets are normal, with normal leaflet mobility. There is no significant tricuspid regurgitation by color flow and continuous wave Doppler imaging.   Other Findings Rhythm: Sinus Rhythm.  Pericardium/Pleural There is no pericardial effusion.  Inferior Vena Cava The IVC diameter is <=21 mm with >50% decrease in size with inspiration suggesting normal right atrial pressure (0-5 mm Hg).  Aorta The aortic root at the sinus of Valsalva is normal.  Cardiac monitor 05/12/2019: Zio patch reviewed.  12 days 5 hours analyzed.  Predominant rhythm is sinus with heart rate ranging from 63 bpm up to 114 bpm and  average heart rate 81 bpm.  Rare PACs and PVCs noted representing less than 1% of total beats.  There were a few, generally brief episodes of SVT, one aberrantly conducted episode of 4 beats, longest SVT episode lasted 13 seconds.  Heart rate up into the 190s during brief SVT.  A few isolated complexes look to be possible preexcitation.  Thee were no sustained arrhythmias or pauses.  Assessment and Plan:    Medication Adjustments/Labs and Tests Ordered: Current medicines are  reviewed at length with the patient today.  Concerns regarding medicines are outlined above.   Tests Ordered: No orders of the defined types were placed in this encounter.   Medication Changes: No orders of the defined types were placed in this encounter.   Disposition:  Follow up {follow up:15908}  Signed, Dana Sark, MD, [facility] 11/24/2019 12:53 PM    [facility] [place], [place], [place] [address] Phone: [phone]; Fax: [phone]

## 2019-11-26 ENCOUNTER — Encounter: Payer: Self-pay | Admitting: Cardiology

## 2020-01-16 ENCOUNTER — Emergency Department (HOSPITAL_COMMUNITY): Payer: Medicare Other

## 2020-01-16 ENCOUNTER — Emergency Department (HOSPITAL_COMMUNITY)
Admission: EM | Admit: 2020-01-16 | Discharge: 2020-01-16 | Disposition: A | Discharge status: Home / Self Care | Payer: Medicare Other | Attending: Emergency Medicine | Admitting: Emergency Medicine

## 2020-01-16 ENCOUNTER — Encounter (HOSPITAL_COMMUNITY): Payer: Self-pay

## 2020-01-16 ENCOUNTER — Other Ambulatory Visit: Payer: Self-pay

## 2020-01-16 DIAGNOSIS — E1140 Type 2 diabetes mellitus with diabetic neuropathy, unspecified: Secondary | ICD-10-CM | POA: Diagnosis not present

## 2020-01-16 DIAGNOSIS — Z79899 Other long term (current) drug therapy: Secondary | ICD-10-CM | POA: Diagnosis not present

## 2020-01-16 DIAGNOSIS — I129 Hypertensive chronic kidney disease with stage 1 through stage 4 chronic kidney disease, or unspecified chronic kidney disease: Secondary | ICD-10-CM | POA: Insufficient documentation

## 2020-01-16 DIAGNOSIS — R1900 Intra-abdominal and pelvic swelling, mass and lump, unspecified site: Secondary | ICD-10-CM

## 2020-01-16 DIAGNOSIS — J45909 Unspecified asthma, uncomplicated: Secondary | ICD-10-CM | POA: Insufficient documentation

## 2020-01-16 DIAGNOSIS — Z20822 Contact with and (suspected) exposure to covid-19: Secondary | ICD-10-CM | POA: Diagnosis not present

## 2020-01-16 DIAGNOSIS — E1122 Type 2 diabetes mellitus with diabetic chronic kidney disease: Secondary | ICD-10-CM | POA: Diagnosis not present

## 2020-01-16 DIAGNOSIS — N189 Chronic kidney disease, unspecified: Secondary | ICD-10-CM | POA: Diagnosis not present

## 2020-01-16 DIAGNOSIS — Z91040 Latex allergy status: Secondary | ICD-10-CM | POA: Insufficient documentation

## 2020-01-16 DIAGNOSIS — R1909 Other intra-abdominal and pelvic swelling, mass and lump: Secondary | ICD-10-CM | POA: Insufficient documentation

## 2020-01-16 DIAGNOSIS — R5383 Other fatigue: Secondary | ICD-10-CM | POA: Diagnosis present

## 2020-01-16 DIAGNOSIS — N390 Urinary tract infection, site not specified: Secondary | ICD-10-CM | POA: Diagnosis not present

## 2020-01-16 LAB — CBC WITH DIFFERENTIAL/PLATELET
Abs Immature Granulocytes: 0.03 10*3/uL (ref 0.00–0.07)
Basophils Absolute: 0.1 10*3/uL (ref 0.0–0.1)
Basophils Relative: 1 %
Eosinophils Absolute: 0.1 10*3/uL (ref 0.0–0.5)
Eosinophils Relative: 2 %
HCT: 41.6 % (ref 36.0–46.0)
Hemoglobin: 13 g/dL (ref 12.0–15.0)
Immature Granulocytes: 0 %
Lymphocytes Relative: 26 %
Lymphs Abs: 2.1 10*3/uL (ref 0.7–4.0)
MCH: 26.2 pg (ref 26.0–34.0)
MCHC: 31.3 g/dL (ref 30.0–36.0)
MCV: 83.7 fL (ref 80.0–100.0)
Monocytes Absolute: 0.6 10*3/uL (ref 0.1–1.0)
Monocytes Relative: 7 %
Neutro Abs: 5.2 10*3/uL (ref 1.7–7.7)
Neutrophils Relative %: 64 %
Platelets: 290 10*3/uL (ref 150–400)
RBC: 4.97 MIL/uL (ref 3.87–5.11)
RDW: 15.9 % — ABNORMAL HIGH (ref 11.5–15.5)
WBC: 8.1 10*3/uL (ref 4.0–10.5)
nRBC: 0 % (ref 0.0–0.2)

## 2020-01-16 LAB — COMPREHENSIVE METABOLIC PANEL
ALT: 52 U/L — ABNORMAL HIGH (ref 0–44)
AST: 62 U/L — ABNORMAL HIGH (ref 15–41)
Albumin: 3.9 g/dL (ref 3.5–5.0)
Alkaline Phosphatase: 62 U/L (ref 38–126)
Anion gap: 11 (ref 5–15)
BUN: 15 mg/dL (ref 8–23)
CO2: 24 mmol/L (ref 22–32)
Calcium: 9.4 mg/dL (ref 8.9–10.3)
Chloride: 102 mmol/L (ref 98–111)
Creatinine, Ser: 1.17 mg/dL — ABNORMAL HIGH (ref 0.44–1.00)
GFR calc Af Amer: 55 mL/min — ABNORMAL LOW (ref >60)
GFR calc non Af Amer: 47 mL/min — ABNORMAL LOW (ref >60)
Glucose, Bld: 90 mg/dL (ref 70–99)
Potassium: 4.3 mmol/L (ref 3.5–5.1)
Sodium: 137 mmol/L (ref 135–145)
Total Bilirubin: 0.5 mg/dL (ref 0.3–1.2)
Total Protein: 7.6 g/dL (ref 6.5–8.1)

## 2020-01-16 LAB — URINALYSIS, ROUTINE W REFLEX MICROSCOPIC
Bilirubin Urine: NEGATIVE
Glucose, UA: NEGATIVE mg/dL
Hgb urine dipstick: NEGATIVE
Ketones, ur: NEGATIVE mg/dL
Nitrite: NEGATIVE
Protein, ur: 30 mg/dL — AB
Specific Gravity, Urine: 1.019 (ref 1.005–1.030)
pH: 5 (ref 5.0–8.0)

## 2020-01-16 LAB — CBG MONITORING, ED: Glucose-Capillary: 107 mg/dL — ABNORMAL HIGH (ref 70–99)

## 2020-01-16 LAB — LIPASE, BLOOD: Lipase: 31 U/L (ref 11–51)

## 2020-01-16 LAB — SARS CORONAVIRUS 2 BY RT PCR (HOSPITAL ORDER, PERFORMED IN ~~LOC~~ HOSPITAL LAB): SARS Coronavirus 2: NEGATIVE

## 2020-01-16 MED ORDER — SODIUM CHLORIDE 0.9 % IV SOLN
1.0000 g | Freq: Once | INTRAVENOUS | Status: AC
  Administered 2020-01-16: 1 g via INTRAVENOUS
  Filled 2020-01-16: qty 10

## 2020-01-16 MED ORDER — IOHEXOL 300 MG/ML  SOLN
100.0000 mL | Freq: Once | INTRAMUSCULAR | Status: AC | PRN
  Administered 2020-01-16: 80 mL via INTRAVENOUS

## 2020-01-16 MED ORDER — SODIUM CHLORIDE 0.9 % IV BOLUS
1000.0000 mL | Freq: Once | INTRAVENOUS | Status: AC
  Administered 2020-01-16: 1000 mL via INTRAVENOUS

## 2020-01-16 MED ORDER — CEPHALEXIN 500 MG PO CAPS: 500.0000 mg | ORAL_CAPSULE | Freq: Four times a day (QID) | ORAL | 0 refills | Status: DC

## 2020-01-16 MED ORDER — ONDANSETRON HCL 4 MG/2ML IJ SOLN
4.0000 mg | Freq: Once | INTRAMUSCULAR | Status: AC
  Administered 2020-01-16: 4 mg via INTRAVENOUS
  Filled 2020-01-16: qty 2

## 2020-01-16 NOTE — ED Provider Notes (Signed)
[facility] EMERGENCY DEPARTMENT Provider Note   CSN: [id number] Arrival date & time: 01/16/20  1039     History Chief Complaint  Patient presents with  . Sore Throat    Dana Romero is a 69 y.o. female.  Dana Romero is a 69 y.o. female with a history of asthma, diabetes, hypertension, GERD, chronic pain syndrome, previous stroke, who presents to the ED with complaints of 5 days of generalized fatigue, body aches, chills and a low-grade fever.  She states she has noticed that she has had a bit of a sore scratchy throat but thought this was more so because she has not had much to eat or drink.  She states she has been very nauseated and has had little to no appetite but no episodes of vomiting.  She has had abdominal pain which is developed and worsened over the past 3 days primarily in the left lower quadrant.  She states at first she thought she was constipated because she had not had a bowel movement in a few days, but after having one that was nonbloody she reports no relief in this pain.  She states that she has had an occasional cough since last night but no chest pain or shortness of breath.  States that she is really just been feeling quite poorly and yesterday felt like she could not get out of bed.  She has not had her Covid vaccine, does not have any known Covid contacts, but does work in a [employer].  No medications prior to arrival.  No other aggravating or alleviating factors.        Past Medical History:  Diagnosis Date  . Anxiety   . Asthma   . Chronic pain syndrome   . Degenerative disc disease, cervical   . Depression   . Essential hypertension 1998  . GERD (gastroesophageal reflux disease)   . Gout   . History of stroke    September 2020  . Seasonal allergies   . Sinusitis, chronic   . Transfusion history   . Type 2 diabetes mellitus ([place]) 1998    Patient Active Problem List   Diagnosis Date Noted  . Pure hypercholesterolemia 04/18/2016  .  Allergic rhinitis 05/09/2012  . Caregiver stress 07/04/2011  . Essential hypertension 07/26/2007  . DM II (diabetes mellitus, type II), controlled ([place]) 11/20/2006    Class: History of  . ANXIETY STATE NOS 11/20/2006  . Chronic kidney disease 11/20/2006  . 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 DIABETIC PERIPHERAL NEUROPATHY 02/21/2006  . PANCREATITIS, HX OF 06/08/2005    Past Surgical History:  Procedure Laterality Date  . APPENDECTOMY    . ARM DEBRIDEMENT Left    debridement of axilla "spider bite"  . BREAST SURGERY     breast cyst x2 -last '12  . CHOLECYSTECTOMY N/A 08/11/2015   Procedure: LAPAROSCOPIC CHOLECYSTECTOMY;  Surgeon: Arta Bruce Kinsinger, MD;  Location: [facility];  Service: General;  Laterality: N/A;  . TONSILLECTOMY    . TONSILLECTOMY AND ADENOIDECTOMY    . TUBAL LIGATION     post partum     OB History    Gravida  3   Para  2   Term      Preterm      AB  1   Living        SAB      TAB      Ectopic      Multiple      Live Births  Family History  Problem Relation Age of Onset  . Hypertension Mother   . Depression Mother   . Stomach cancer Mother 36  . Diabetes Maternal Grandmother   . Hypertension Maternal Grandmother   . Breast cancer Maternal Grandmother   . Heart attack Maternal Grandmother   . Hypertension Brother   . Stroke Other   . Diabetes Maternal Aunt   . Breast cancer Maternal Aunt   . Diabetes Maternal Uncle   . Hypertension Maternal Uncle   . Hypertension Maternal Uncle     Social History   Tobacco Use  . Smoking status: Never Smoker  . Smokeless tobacco: Never Used  Substance Use Topics  . Alcohol use: No    Comment: none in many years -moderate use previous  . Drug use: No    Home Medications Prior to Admission medications   Medication Sig Start Date End Date Taking? Authorizing Provider  ADVAIR [facility] 115-21 MCG/ACT inhaler INHALE 2 PUFFS INTO LUNGS TWICE DAILY 04/05/17   Wardell Honour,  MD  albuterol (PROVENTIL) (2.5 MG/3ML) 0.083% nebulizer solution USE ONE VIAL IN NEBULIZER EVERY 6 HOURS AS NEEDED FOR WHEEZING OR SHORTNESS OF BREATH 09/25/15   Wardell Honour, MD  albuterol (VENTOLIN HFA) 108 (90 Base) MCG/ACT inhaler INHALE ONE TO TWO PUFFS BY MOUTH EVERY 6 HOURS AS NEEDED FOR WHEEZING OR SHORTNESS OF BREATH (OR PERSISTENT COUGHING) 01/18/16   Wardell Honour, MD  Alcohol Swabs PADS Use to check blood sugar daily. E11.9 04/07/15   Wardell Honour, MD  amitriptyline (ELAVIL) 25 MG tablet Take 1 tablet (25 mg total) by mouth at bedtime. 12/15/15   Wardell Honour, MD  blood glucose meter kit and supplies KIT Dispense based on patient and insurance preference. Use once a day.  DX: E11.9. 12/31/14   Wardell Honour, MD  blood glucose meter kit and supplies Dispense based on patient and insurance preference. Use up to four times daily as directed. (FOR ICD-10 E11.9). 03/31/15   Wardell Honour, MD  Blood Glucose Monitoring Suppl (FREESTYLE LITE) DEVI  01/12/15   [provider]  carvedilol (COREG) 25 MG tablet Take 1 tablet (25 mg total) by mouth 2 (two) times daily with a meal. 01/18/16   Wardell Honour, MD  cephALEXin (KEFLEX) 500 MG capsule Take 1 capsule (500 mg total) by mouth 4 (four) times daily. 01/16/20   Jacqlyn Larsen, PA-C  cetirizine (ZYRTEC ALLERGY) 10 MG tablet Take 10 mg by mouth daily.    [provider]  Cinnamon 500 MG capsule Take 1,000 mg by mouth daily.    [provider]  clopidogrel (PLAVIX) 75 MG tablet Take 75 mg by mouth daily.    [provider]  colchicine 0.6 MG tablet Take 0.6 mg by mouth daily as needed (gout flare up).     [provider]  diphenhydrAMINE (BENADRYL) 25 mg capsule Take 25 mg by mouth at bedtime.    [provider]  EPINEPHrine (EPIPEN 2-PAK) 0.3 mg/0.3 mL IJ SOAJ injection Inject 0.3 mLs (0.3 mg total) into the muscle once. 01/07/15   Wardell Honour, MD  esomeprazole (NEXIUM) 40 MG capsule  TAKE ONE CAPSULE BY MOUTH EVERY DAY BEFORE BREAKFAST 04/25/17   Wardell Honour, MD  estradiol (VIVELLE-DOT) 0.05 MG/24HR patch Place 1 patch (0.05 mg total) onto the skin 2 (two) times a week. 01/20/16   Wardell Honour, MD  ezetimibe (ZETIA) 10 MG tablet Take 1 tablet (10 mg  total) by mouth daily. 04/15/19 07/14/19  Satira Sark, MD  FLUoxetine (PROZAC) 20 MG tablet Take 4 tablets (80 mg total) by mouth daily. 04/11/16   Wardell Honour, MD  glucose blood test strip Test blood sugar daily.  DX: E11.9 04/06/15   Wardell Honour, MD  glucose monitoring kit (FREESTYLE) monitoring kit Test blood sugar daily. DX: E11.9 01/12/15   Wardell Honour, MD  HYDROcodone-acetaminophen [facility]) 10-325 MG per tablet Take 1 tablet by mouth every 6 (six) hours as needed for moderate pain or severe pain.     [provider]  ketotifen (ALAWAY) 0.025 % ophthalmic solution Place 1 drop into both eyes 2 (two) times daily as needed (Allergies).    [provider]  Lancet Devices (LANCING DEVICE) MISC Use to test blood sugar daily. E11.9 04/07/15   Wardell Honour, MD  Lancets MISC Test blood sugar daily. DX: E11.9 04/06/15   Wardell Honour, MD  losartan (COZAAR) 100 MG tablet Take 1 tablet (100 mg total) by mouth daily. 01/19/16   Wardell Honour, MD  magnesium oxide (MAG-OX) 400 MG tablet Take 1 tablet (400 mg total) by mouth 2 (two) times daily. Patient taking differently: Take 400 mg by mouth 3 (three) times daily.  07/21/12   Barton Fanny, MD  medroxyPROGESTERone (PROVERA) 2.5 MG tablet TAKE ONE TABLET BY MOUTH ONCE DAILY 10/14/15   Wardell Honour, MD  Multiple Vitamins-Minerals (MULTIVITAMIN ADULT PO) Take 1 tablet by mouth daily.    [provider]  omega-3 acid ethyl esters (LOVAZA) 1 g capsule TAKE TWO CAPSULES BY MOUTH TWICE DAILY 08/05/15   Wardell Honour, MD  OVER THE COUNTER MEDICATION Take 1 tablet by mouth daily. Focus Factor: Brain Function Support    [provider]   promethazine (PHENERGAN) 12.5 MG tablet TAKE ONE TABLET BY MOUTH EVERY 8 HOURS AS NEEDED. NEED CHECK UP/LABS FOR ADDITIONAL REFILLS 11/04/15   Wardell Honour, MD  Propylene Glycol-Glycerin (SOOTHE OP) Apply 1 drop to eye daily.    [provider]  sitaGLIPtin (JANUVIA) 50 MG tablet Take 1 tablet (50 mg total) by mouth daily. 05/11/15   Wardell Honour, MD  thiamine (VITAMIN B-1) 100 MG tablet Take 100 mg by mouth daily.    [provider]  triamcinolone (NASACORT) 55 MCG/ACT nasal inhaler Place 2 sprays into the nose daily. Patient taking differently: Place 2 sprays into the nose daily as needed (allergies).  09/03/12   Barton Fanny, MD  vitamin C (ASCORBIC ACID) 500 MG tablet Take 500 mg by mouth daily.    [provider]  Vitamin D, Ergocalciferol, (DRISDOL) 50000 units CAPS capsule Take 50,000 Units by mouth every 7 (seven) days.    [provider]  vitamin E 400 UNIT capsule Take 400 Units by mouth daily.    [provider]    Allergies    Amlodipine besylate, Atorvastatin, Ezetimibe-simvastatin, Losartan potassium-hctz, Morphine and related, Niacin, Rosuvastatin, Toprol xl [metoprolol succinate], Valium [diazepam], and Latex  Review of Systems   Review of Systems  Constitutional: Positive for appetite change, chills and fatigue. Negative for fever.  HENT: Positive for congestion and sore throat.   Respiratory: Positive for cough. Negative for shortness of breath.   Cardiovascular: Negative for chest pain.  Gastrointestinal: Positive for abdominal pain and constipation. Negative for nausea and vomiting.  Genitourinary: Positive for frequency. Negative for dysuria, flank pain, hematuria, vaginal bleeding and vaginal discharge.  Musculoskeletal: Negative for  arthralgias and myalgias.  Skin: Negative for color change and rash.  Neurological: Negative for syncope, light-headedness and headaches.    Physical Exam Updated Vital Signs BP  (!) 161/81   Pulse 83   Temp 98.7 F (37.1 C) (Oral)   Resp 18   Ht 5' 4"  (1.626 m)   Wt 72.6 kg   LMP 03/15/2014   SpO2 96%   BMI 27.46 kg/m   Physical Exam Vitals and nursing note reviewed.  Constitutional:      General: She is not in acute distress.    Appearance: She is well-developed and normal weight. She is not diaphoretic.     Comments: Well-appearing and in no distress  HENT:     Head: Normocephalic and atraumatic.     Nose: Congestion present.     Mouth/Throat:     Mouth: Mucous membranes are moist.     Pharynx: Uvula midline. Posterior oropharyngeal erythema present. No pharyngeal swelling.     Tonsils: No tonsillar exudate.     Comments: Posterior oropharynx clear and mucous membranes moist, there is mild erythema but no edema or tonsillar exudates, uvula midline, normal phonation, no trismus, tolerating secretions without difficulty. Eyes:     General:        Right eye: No discharge.        Left eye: No discharge.     Pupils: Pupils are equal, round, and reactive to light.  Cardiovascular:     Rate and Rhythm: Normal rate and regular rhythm.     Heart sounds: Normal heart sounds.  Pulmonary:     Effort: Pulmonary effort is normal. No respiratory distress.     Breath sounds: Normal breath sounds. No wheezing or rales.     Comments: Respirations equal and unlabored, patient able to speak in full sentences, lungs clear to auscultation bilaterally Abdominal:     General: Bowel sounds are normal. There is no distension.     Palpations: Abdomen is soft. There is no mass.     Tenderness: There is abdominal tenderness. There is no guarding.     Comments: Abdomen is soft, nondistended, bowel sounds present throughout, there is tenderness in the lower abdomen primarily in the left lower quadrant and suprapubic region without guarding or peritoneal signs, no CVA tenderness bilaterally  Musculoskeletal:        General: No deformity.     Cervical back: Neck supple.    Skin:    General: Skin is warm and dry.     Capillary Refill: Capillary refill takes less than 2 seconds.  Neurological:     Mental Status: She is alert and oriented to person, place, and time.     Coordination: Coordination normal.     Comments: Speech is clear, able to follow commands Moves extremities without ataxia, coordination intact  Psychiatric:        Mood and Affect: Mood normal.        Behavior: Behavior normal.     ED Results / Procedures / Treatments   Labs (all labs ordered are listed, but only abnormal results are displayed) Labs Reviewed  URINE CULTURE - Abnormal; Notable for the following components:      Result Value   Culture >=100,000 COLONIES/mL ESCHERICHIA COLI (*)    Organism ID, Bacteria ESCHERICHIA COLI (*)    All other components within normal limits  COMPREHENSIVE METABOLIC PANEL - Abnormal; Notable for the following components:   Creatinine, Ser 1.17 (*)    AST 62 (*)  ALT 52 (*)    GFR calc non Af Amer 47 (*)    GFR calc Af Amer 55 (*)    All other components within normal limits  CBC WITH DIFFERENTIAL/PLATELET - Abnormal; Notable for the following components:   RDW 15.9 (*)    All other components within normal limits  URINALYSIS, ROUTINE W REFLEX MICROSCOPIC - Abnormal; Notable for the following components:   APPearance CLOUDY (*)    Protein, ur 30 (*)    Leukocytes,Ua SMALL (*)    Bacteria, UA MANY (*)    All other components within normal limits  CBG MONITORING, ED - Abnormal; Notable for the following components:   Glucose-Capillary 107 (*)    All other components within normal limits  SARS CORONAVIRUS 2 BY RT PCR [facility] ORDER, [place] [facility])  LIPASE, BLOOD    EKG EKG Interpretation  Date/Time:  Friday January 16 2020 11:57:33 EDT Ventricular Rate:  90 PR Interval:  154 QRS Duration: 68 QT Interval:  364 QTC Calculation: 445 R Axis:   -10 Text Interpretation: Normal sinus rhythm Anteroseptal  infarct , age undetermined Abnormal ECG Confirmed by Merrily Pew [phone]) on 01/18/2020 12:34:16 AM   Radiology CT Abdomen Pelvis W Contrast  Result Date: 01/16/2020 CLINICAL DATA:  Left-sided abdominal pain, body aches, fever and chills EXAM: CT ABDOMEN AND PELVIS WITH CONTRAST TECHNIQUE: Multidetector CT imaging of the abdomen and pelvis was performed using the standard protocol following bolus administration of intravenous contrast. CONTRAST:  69m OMNIPAQUE IOHEXOL 300 MG/ML  SOLN COMPARISON:  05/30/2005 FINDINGS: Lower chest: Moderate hiatal hernia. No acute pleural or parenchymal lung disease. Hepatobiliary: No focal liver abnormality is seen. Status post cholecystectomy. No biliary dilatation. Pancreas: Unremarkable. No pancreatic ductal dilatation or surrounding inflammatory changes. Spleen: Normal in size without focal abnormality. Adrenals/Urinary Tract: Adrenal glands are unremarkable. Kidneys are normal, without renal calculi, focal lesion, or hydronephrosis. Bladder is unremarkable. Stomach/Bowel: No bowel obstruction or ileus. No wall thickening or inflammatory change. The appendix is surgically absent. Vascular/Lymphatic: Aortic atherosclerosis. No enlarged abdominal or pelvic lymph nodes. Reproductive: Uterus is atrophic.  There are no adnexal masses. There is abnormal soft tissue density at the vaginal introitus, with soft tissue mass involving the vagina measuring 5.5 x 5.8 cm. Vulvar malignancy is suspected, and direct visual inspection is recommended. Other: No free fluid or free gas.  No abdominal wall hernia. Musculoskeletal: No acute or destructive bony lesions. Reconstructed images demonstrate no additional findings. IMPRESSION: 1. Soft tissue mass involving the vagina, extending to the vaginal introitus. Findings are highly concerning for malignancy, and direct visual inspection and pelvic exam recommended. 2. Moderate hiatal hernia. 3.  Aortic Atherosclerosis (ICD10-I70.0).  Electronically Signed   By: MRanda NgoM.D.   On: 01/16/2020 16:00   DG Chest Portable 1 View  Result Date: 01/16/2020 CLINICAL DATA:  Cough and low-grade fever for several days.  Chills. EXAM: PORTABLE CHEST 1 VIEW COMPARISON:  02/03/2019 FINDINGS: The heart size and mediastinal contours are within normal limits. Both lungs are clear. Small to moderate hiatal hernia again seen. The visualized skeletal structures are unremarkable. IMPRESSION: No active cardiopulmonary disease. Stable small to moderate hiatal hernia. Electronically Signed   By: JMarlaine HindM.D.   On: 01/16/2020 12:24    Procedures Procedures (including critical care time)  Medications Ordered in ED Medications  sodium chloride 0.9 % bolus 1,000 mL (1,000 mLs Intravenous New Bag/Given 01/16/20 1503)  ondansetron (ZOFRAN) injection 4 mg (4 mg Intravenous Given  01/16/20 1503)  iohexol (OMNIPAQUE) 300 MG/ML solution 100 mL (80 mLs Intravenous Contrast Given 01/16/20 1540)  cefTRIAXone (ROCEPHIN) 1 g in sodium chloride 0.9 % 100 mL IVPB (1 g Intravenous New Bag/Given 01/16/20 1655)    ED Course  I have reviewed the triage vital signs and the nursing notes.  Pertinent labs & imaging results that were available during my care of the patient were reviewed by me and considered in my medical decision making (see chart for details).  Clinical Course as of Jan 19 2337  Tue Jan 20, 2020  2335 HCT: 25.6 [KF]    Clinical Course User Index [KF] Dana Romero   MDM Rules/Calculators/A&P                         69 year old female presents with 5 days of general malaise, fatigue, sore throat, and lower abdominal pain with poor appetite.  On arrival she has normal vitals aside from some hypertension, and is overall well-appearing.  Initial complaint was sore throat, but in speaking more with patient it seems her more concerning symptoms are lower abdominal pain and generalized malaise and chills.  She does have pain and tenderness  on exam in the left lower quadrant and suprapubic region.  Will get abdominal labs and CT abdomen pelvis to rule out diverticulitis or other intra-abdominal cause for her symptoms we will also get Covid swab and chest x-ray.  IV fluids and Zofran given.  Covid is negative Patient without leukocytosis, normal hemoglobin, no significant electrolyte derangements, slight bump in creatinine at 1.17 and very slight elevation in transaminases, normal lipase.  Urinalysis concerning for infection with 11-20 WBCs and many bacteria present, small leuks noted, will send for culture but will treat for UTI, dose of IV Rocephin given in emergency department.  Chest x-ray is unremarkable.  CT with soft tissue mass involving the vagina and extending to the vaginal introitus concerning for malignancy, recommend direct visualization, moderate hiatal hernia, no other acute findings noted.  Visual exam limited, unable to see any obvious vulvar mass, vaginal introitus appears unremarkable except for some possible bladder prolapse, on speculum exam unable to visualize an obvious mass or change in mucosa.  Patient without any discharge or bleeding.  Will have patient follow-up with OB/GYN for further close evaluation.  I stressed the importance of this to the patient I discussed concern for acute trauma malignancy based on CT results.  In the meantime will treat with Keflex for UTI, cultures pending.  Patient expresses understanding and agreement with this plan.  Discharged home in good condition.  Final Clinical Impression(s) / ED Diagnoses Final diagnoses:  Acute UTI  Pelvic mass    Rx / DC Orders ED Discharge Orders         Ordered    cephALEXin (KEFLEX) 500 MG capsule  4 times daily        01/16/20 1804           Dana Romero 01/20/20 2339    Milton Ferguson, MD 01/21/20 1246

## 2020-01-16 NOTE — ED Triage Notes (Signed)
Pt presents to ED with complaints of sore throat, body aches, chills, low grade fever started few days ago but got worse yesterday.

## 2020-01-16 NOTE — Discharge Instructions (Signed)
You have a UTI this is what is probably making you feel so poorly.  Please take antibiotics 4 times daily as directed.  If you develop fevers, worsening symptoms, abdominal pain, vomiting or flank pain or any other new or concerning symptoms return to the emergency department.  Otherwise please follow-up closely with your primary care provider to ensure UTI has resolved.  Your CT scan showed a possible mass in your vulva and vagina I did not appreciate an obvious mass on exam it is important that you follow-up closely with an OB/GYN for them to evaluate this.  Please call the number provided to follow-up.

## 2020-01-19 LAB — URINE CULTURE: Culture: >=100000 — AB

## 2020-01-20 ENCOUNTER — Telehealth: Payer: Self-pay | Admitting: Emergency Medicine

## 2020-01-20 NOTE — Telephone Encounter (Signed)
Post ED Visit - Positive Culture Follow-up  Culture report reviewed by antimicrobial stewardship pharmacist: [facility] []  , Pharm.D. []  Enzo Bi, Pharm.D., BCPS AQ-ID []  , Pharm.D., BCPS []  Celedonio Miyamoto, Pharm.D., BCPS []  [place], Garvin Fila.D., BCPS, AAHIVP []  , Pharm.D., BCPS, AAHIVP []  Georgina Pillion, PharmD, BCPS []  , PharmD, BCPS []  [place], PharmD, BCPS []  [address], PharmD []  , PharmD, BCPS []  Estella Husk, PharmD  Pharmacy Team []  Lysle Pearl, PharmD []  , PharmD []  Phillips Climes, PharmD []  , Rph []  Agapito Games) , PharmD []  Verlan Friends, PharmD []  , PharmD []  Mervyn Gay, PharmD []  , PharmD []  Vinnie Level, PharmD []  [facility], PharmD []  , PharmD []  Len Childs, PharmD PharmD   Positive urine culture Treated with cephalexin, organism sensitive to the same and no further patient follow-up is required at this time.  Greer Pickerel 01/20/2020, 10:13 AM

## 2020-01-22 ENCOUNTER — Encounter: Payer: Self-pay | Admitting: Obstetrics & Gynecology

## 2020-01-22 ENCOUNTER — Other Ambulatory Visit: Payer: Self-pay

## 2020-01-22 ENCOUNTER — Ambulatory Visit: Payer: Medicare Other | Admitting: Obstetrics & Gynecology

## 2020-01-22 VITALS — BP 159/94 | HR 100 | Ht 64.0 in | Wt 158.0 lb

## 2020-01-22 DIAGNOSIS — R1900 Intra-abdominal and pelvic swelling, mass and lump, unspecified site: Secondary | ICD-10-CM

## 2020-01-22 MED ORDER — ONDANSETRON HCL 8 MG PO TABS: 8.0000 mg | ORAL_TABLET | Freq: Three times a day (TID) | ORAL | 1 refills | Status: DC | PRN

## 2020-01-22 NOTE — Progress Notes (Signed)
Follow up appointment for results  Chief Complaint  Patient presents with  . Follow-up    seen in ER pelvic mass found on ct scan    Blood pressure (!) 159/94, pulse 100, height 5\' 4"  (1.626 m), weight 158 lb (71.7 kg), last menstrual period 03/15/2014.  Study Result  Narrative & Impression  CLINICAL DATA:  Left-sided abdominal pain, body aches, fever and chills  EXAM: CT ABDOMEN AND PELVIS WITH CONTRAST  TECHNIQUE: Multidetector CT imaging of the abdomen and pelvis was performed using the standard protocol following bolus administration of intravenous contrast.  CONTRAST:  60mL OMNIPAQUE IOHEXOL 300 MG/ML  SOLN  COMPARISON:  05/30/2005  FINDINGS: Lower chest: Moderate hiatal hernia. No acute pleural or parenchymal lung disease.  Hepatobiliary: No focal liver abnormality is seen. Status post cholecystectomy. No biliary dilatation.  Pancreas: Unremarkable. No pancreatic ductal dilatation or surrounding inflammatory changes.  Spleen: Normal in size without focal abnormality.  Adrenals/Urinary Tract: Adrenal glands are unremarkable. Kidneys are normal, without renal calculi, focal lesion, or hydronephrosis. Bladder is unremarkable.  Stomach/Bowel: No bowel obstruction or ileus. No wall thickening or inflammatory change. The appendix is surgically absent.  Vascular/Lymphatic: Aortic atherosclerosis. No enlarged abdominal or pelvic lymph nodes.  Reproductive: Uterus is atrophic.  There are no adnexal masses.  There is abnormal soft tissue density at the vaginal introitus, with soft tissue mass involving the vagina measuring 5.5 x 5.8 cm. Vulvar malignancy is suspected, and direct visual inspection is recommended.  Other: No free fluid or free gas.  No abdominal wall hernia.  Musculoskeletal: No acute or destructive bony lesions. Reconstructed images demonstrate no additional findings.  IMPRESSION: 1. Soft tissue mass involving the vagina,  extending to the vaginal introitus. Findings are highly concerning for malignancy, and direct visual inspection and pelvic exam recommended. 2. Moderate hiatal hernia. 3.  Aortic Atherosclerosis (ICD10-I70.0).   Electronically Signed   By: 06/01/2005 M.D.   On: 01/16/2020 16:00     General WDWN female NAD Abdomen bimanual is significant for a fullness left of midline tender but the remainder of the exam is normal, cannot appreciate the mass found on CT scan on abdomen or pelvis Vulva:  normal appearing vulva with no masses, tenderness or lesions Vagina:  normal mucosa, no discharge Cervix:  Normal no lesions Uterus:  normal size, contour, position, consistency, mobility, non-tender Adnexa: ovaries:present,  normal adnexa in size, nontender and no masses   MEDS ordered this encounter: Meds ordered this encounter  Medications  . DISCONTD: ondansetron (ZOFRAN) 8 MG tablet    Sig: Take 1 tablet (8 mg total) by mouth every 8 (eight) hours as needed for nausea.    Dispense:  24 tablet    Refill:  1  . ondansetron (ZOFRAN) 8 MG tablet    Sig: Take 1 tablet (8 mg total) by mouth every 8 (eight) hours as needed for nausea.    Dispense:  24 tablet    Refill:  1    Orders for this encounter: Orders Placed This Encounter  Procedures  . MR PELVIS W WO CONTRAST    Impression:   ICD-10-CM   1. Pelvic mass  R19.00 MR PELVIS W WO CONTRAST    MR PELVIS W WO CONTRAST     Plan: Discussed with Dr 12-11-1988, my exam and the CT are not consistent, I don't feel on exam where the mass is on CT, gyn exam is normal, so we will break the tie with the MRI  Follow Up: No  follow-ups on file.       Face to face time:  20 minutes  Greater than 50% of the visit time was spent in counseling and coordination of care with the patient.  The summary and outline of the counseling and care coordination is summarized in the note above.   All questions were answered.  Past Medical History:   Diagnosis Date  . Anxiety   . Asthma   . Chronic pain syndrome   . Degenerative disc disease, cervical   . Depression   . Essential hypertension 1998  . GERD (gastroesophageal reflux disease)   . Gout   . History of stroke    September 2020  . Seasonal allergies   . Sinusitis, chronic   . Transfusion history   . Type 2 diabetes mellitus (HCC) 1998    Past Surgical History:  Procedure Laterality Date  . APPENDECTOMY    . ARM DEBRIDEMENT Left    debridement of axilla "spider bite"  . BREAST SURGERY     breast cyst x2 -last '12  . CHOLECYSTECTOMY N/A 08/11/2015   Procedure: LAPAROSCOPIC CHOLECYSTECTOMY;  Surgeon: De Blanch Kinsinger, MD;  Location: [facility];  Service: General;  Laterality: N/A;  . TONSILLECTOMY    . TONSILLECTOMY AND ADENOIDECTOMY    . TUBAL LIGATION     post partum    OB History    Gravida  3   Para  2   Term      Preterm      AB  1   Living        SAB      TAB      Ectopic      Multiple      Live Births              Allergies  Allergen Reactions  . Lyrica [Pregabalin]   . Amlodipine Besylate     REACTION: intolerance  . Atorvastatin     REACTION: intolerance  . Ezetimibe-Simvastatin     REACTION: intolerance  . Losartan Potassium-Hctz     Pt is unsure   . Morphine And Related Other (See Comments)    MIGRAINE  . Niacin     REACTION: intolerance  . Rosuvastatin     REACTION: intolerance  . Toprol Xl [Metoprolol Succinate] Other (See Comments)    Makes her feel weak  . Valium [Diazepam]     Has reverse effect on patient   . Latex Rash    Social History   Socioeconomic History  . Marital status: Married    Spouse name: Not on file  . Number of children: Not on file  . Years of education: Not on file  . Highest education level: Not on file  Occupational History  . Occupation: Curator: [employer]  Tobacco Use  . Smoking status: Never Smoker  . Smokeless tobacco: Never Used  Substance  and Sexual Activity  . Alcohol use: No    Comment: none in many years -moderate use previous  . Drug use: No  . Sexual activity: Not on file  Other Topics Concern  . Not on file  Social History Narrative  . Not on file   Social Determinants of Health   Financial Resource Strain:   . Difficulty of Paying Living Expenses: Not on file  Food Insecurity:   . Worried About Programme researcher, broadcasting/film/video in the Last Year: Not on file  . Ran Out of Food in the  Last Year: Not on file  Transportation Needs:   . Lack of Transportation (Medical): Not on file  . Lack of Transportation (Non-Medical): Not on file  Physical Activity:   . Days of Exercise per Week: Not on file  . Minutes of Exercise per Session: Not on file  Stress:   . Feeling of Stress : Not on file  Social Connections:   . Frequency of Communication with Friends and Family: Not on file  . Frequency of Social Gatherings with Friends and Family: Not on file  . Attends Religious Services: Not on file  . Active Member of Clubs or Organizations: Not on file  . Attends Banker Meetings: Not on file  . Marital Status: Not on file    Family History  Problem Relation Age of Onset  . Hypertension Mother   . Depression Mother   . Stomach cancer Mother 68  . Diabetes Maternal Grandmother   . Hypertension Maternal Grandmother   . Breast cancer Maternal Grandmother   . Heart attack Maternal Grandmother   . Hypertension Brother   . Stroke Other   . Diabetes Maternal Aunt   . Breast cancer Maternal Aunt   . Diabetes Maternal Uncle   . Hypertension Maternal Uncle   . Hypertension Maternal Uncle

## 2020-01-27 ENCOUNTER — Telehealth: Payer: Self-pay | Admitting: *Deleted

## 2020-01-27 NOTE — Telephone Encounter (Signed)
MRI scheduled at [facility] per Dr Despina Hidden.  LMOVM scheduled for 9/30 at 5pm, will need to arrive at 4:30.

## 2020-02-05 ENCOUNTER — Other Ambulatory Visit: Payer: Self-pay

## 2020-02-05 ENCOUNTER — Ambulatory Visit (HOSPITAL_COMMUNITY)
Admission: RE | Admit: 2020-02-05 | Discharge: 2020-02-05 | Disposition: A | Discharge status: Home / Self Care | Payer: Medicare Other | Source: Ambulatory Visit | Attending: Obstetrics & Gynecology | Admitting: Obstetrics & Gynecology

## 2020-02-05 DIAGNOSIS — R1900 Intra-abdominal and pelvic swelling, mass and lump, unspecified site: Secondary | ICD-10-CM | POA: Diagnosis not present

## 2020-02-05 MED ORDER — GADOBUTROL 1 MMOL/ML IV SOLN
7.0000 mL | Freq: Once | INTRAVENOUS | Status: AC | PRN
  Administered 2020-02-05: 7 mL via INTRAVENOUS

## 2020-03-15 ENCOUNTER — Encounter (HOSPITAL_COMMUNITY): Payer: Self-pay

## 2020-03-15 ENCOUNTER — Other Ambulatory Visit: Payer: Self-pay

## 2020-03-15 ENCOUNTER — Emergency Department (HOSPITAL_COMMUNITY)
Admission: EM | Admit: 2020-03-15 | Discharge: 2020-03-15 | Disposition: A | Discharge status: Home / Self Care | Payer: Medicare Other | Attending: Emergency Medicine | Admitting: Emergency Medicine

## 2020-03-15 DIAGNOSIS — R519 Headache, unspecified: Secondary | ICD-10-CM | POA: Diagnosis not present

## 2020-03-15 DIAGNOSIS — R5383 Other fatigue: Secondary | ICD-10-CM | POA: Diagnosis not present

## 2020-03-15 DIAGNOSIS — Z5321 Procedure and treatment not carried out due to patient leaving prior to being seen by health care provider: Secondary | ICD-10-CM | POA: Insufficient documentation

## 2020-03-15 DIAGNOSIS — R112 Nausea with vomiting, unspecified: Secondary | ICD-10-CM | POA: Insufficient documentation

## 2020-03-15 HISTORY — DX: Intra-abdominal and pelvic swelling, mass and lump, unspecified site: R19.00

## 2020-03-15 LAB — COMPREHENSIVE METABOLIC PANEL
ALT: 37 U/L (ref 0–44)
AST: 54 U/L — ABNORMAL HIGH (ref 15–41)
Albumin: 3.7 g/dL (ref 3.5–5.0)
Alkaline Phosphatase: 57 U/L (ref 38–126)
Anion gap: 9 (ref 5–15)
BUN: 19 mg/dL (ref 8–23)
CO2: 27 mmol/L (ref 22–32)
Calcium: 9.4 mg/dL (ref 8.9–10.3)
Chloride: 102 mmol/L (ref 98–111)
Creatinine, Ser: 1.45 mg/dL — ABNORMAL HIGH (ref 0.44–1.00)
GFR, Estimated: 39 mL/min — ABNORMAL LOW (ref >60)
Glucose, Bld: 118 mg/dL — ABNORMAL HIGH (ref 70–99)
Potassium: 4.4 mmol/L (ref 3.5–5.1)
Sodium: 138 mmol/L (ref 135–145)
Total Bilirubin: 0.3 mg/dL (ref 0.3–1.2)
Total Protein: 7.1 g/dL (ref 6.5–8.1)

## 2020-03-15 LAB — CBC WITH DIFFERENTIAL/PLATELET
Abs Immature Granulocytes: 0.03 10*3/uL (ref 0.00–0.07)
Basophils Absolute: 0.1 10*3/uL (ref 0.0–0.1)
Basophils Relative: 1 %
Eosinophils Absolute: 0.4 10*3/uL (ref 0.0–0.5)
Eosinophils Relative: 5 %
HCT: 41.9 % (ref 36.0–46.0)
Hemoglobin: 13.1 g/dL (ref 12.0–15.0)
Immature Granulocytes: 0 %
Lymphocytes Relative: 27 %
Lymphs Abs: 2.3 10*3/uL (ref 0.7–4.0)
MCH: 26.5 pg (ref 26.0–34.0)
MCHC: 31.3 g/dL (ref 30.0–36.0)
MCV: 84.6 fL (ref 80.0–100.0)
Monocytes Absolute: 0.7 10*3/uL (ref 0.1–1.0)
Monocytes Relative: 8 %
Neutro Abs: 5.1 10*3/uL (ref 1.7–7.7)
Neutrophils Relative %: 59 %
Platelets: 269 10*3/uL (ref 150–400)
RBC: 4.95 MIL/uL (ref 3.87–5.11)
RDW: 15.7 % — ABNORMAL HIGH (ref 11.5–15.5)
WBC: 8.6 10*3/uL (ref 4.0–10.5)
nRBC: 0 % (ref 0.0–0.2)

## 2020-03-15 LAB — CBG MONITORING, ED: Glucose-Capillary: 109 mg/dL — ABNORMAL HIGH (ref 70–99)

## 2020-03-15 NOTE — ED Triage Notes (Signed)
Pt reports vomiting for the past 3 or 4 weeks.  Reports has been treated here and in [facility] ED twice since symptoms started.  Reports was told she had a mass in her pelvis and has followed up with OBGYN.  Reports had an MRI on 9/30.  C/O nausea and fatigue.

## 2020-05-18 ENCOUNTER — Encounter: Payer: Self-pay | Admitting: Nurse Practitioner

## 2020-05-18 ENCOUNTER — Ambulatory Visit (INDEPENDENT_AMBULATORY_CARE_PROVIDER_SITE_OTHER): Payer: Medicare HMO | Admitting: Nurse Practitioner

## 2020-05-18 ENCOUNTER — Other Ambulatory Visit: Payer: Self-pay

## 2020-05-18 VITALS — BP 155/94 | HR 78 | Temp 97.9°F | Resp 16 | Ht 64.0 in | Wt 137.0 lb

## 2020-05-18 DIAGNOSIS — R110 Nausea: Secondary | ICD-10-CM

## 2020-05-18 DIAGNOSIS — E7800 Pure hypercholesterolemia, unspecified: Secondary | ICD-10-CM

## 2020-05-18 DIAGNOSIS — K219 Gastro-esophageal reflux disease without esophagitis: Secondary | ICD-10-CM | POA: Insufficient documentation

## 2020-05-18 DIAGNOSIS — J45909 Unspecified asthma, uncomplicated: Secondary | ICD-10-CM | POA: Insufficient documentation

## 2020-05-18 DIAGNOSIS — E1142 Type 2 diabetes mellitus with diabetic polyneuropathy: Secondary | ICD-10-CM

## 2020-05-18 DIAGNOSIS — J4540 Moderate persistent asthma, uncomplicated: Secondary | ICD-10-CM

## 2020-05-18 DIAGNOSIS — Z7689 Persons encountering health services in other specified circumstances: Secondary | ICD-10-CM

## 2020-05-18 DIAGNOSIS — M1990 Unspecified osteoarthritis, unspecified site: Secondary | ICD-10-CM

## 2020-05-18 DIAGNOSIS — M109 Gout, unspecified: Secondary | ICD-10-CM | POA: Diagnosis not present

## 2020-05-18 DIAGNOSIS — F411 Generalized anxiety disorder: Secondary | ICD-10-CM | POA: Diagnosis not present

## 2020-05-18 DIAGNOSIS — J301 Allergic rhinitis due to pollen: Secondary | ICD-10-CM

## 2020-05-18 DIAGNOSIS — N951 Menopausal and female climacteric states: Secondary | ICD-10-CM

## 2020-05-18 DIAGNOSIS — R519 Headache, unspecified: Secondary | ICD-10-CM

## 2020-05-18 NOTE — Patient Instructions (Addendum)
We will complete a physical in 3 months when we can get labs. I referred you to neurology for your headches, and they will decide which imaging would be best. If you have acute issues including difficulty speaking, facial droop, or weakness, go to the emergency department immediately.    For your anxiety, you are taking multiple medications including elavil, prozac, and alprazolam. The alprazolam was last filled on 05/05/20 by Allyn Kenner.  I will refer you to psychiatry for management of anxiety since your anxiety requires multiple medications.

## 2020-05-18 NOTE — Assessment & Plan Note (Addendum)
-  no A1c to review today -takes ozempic and was started on this recently -no other glycemic agents -not on ARB or statin d/t allergies

## 2020-05-18 NOTE — Assessment & Plan Note (Signed)
-  not currently followed by pulmonology -uses albuterol inhaler  -has albuterol nebs PRN

## 2020-05-18 NOTE — Assessment & Plan Note (Addendum)
-  chronic; takes zyrtec, benadryl PRN, and nasacort PRN -uses ketotifen (zaditor) eye drops for allergies -she states she has epi-pen for cholesterol medicines

## 2020-05-18 NOTE — Assessment & Plan Note (Signed)
-  has chronic pain; managed by Dr. Ethelene Hal -takes hydrocodone-APAP 10-325 q6h PRN from Dr. Ethelene Hal

## 2020-05-18 NOTE — Assessment & Plan Note (Signed)
-  states she needs alprazolam rx -she has been prescribed alprazolam 1 mg BID, and last fill was 05/05/20 -she is taking amitriptyline 25 mg daily for pain -taking fluoxetine 80 mg daily

## 2020-05-18 NOTE — Progress Notes (Signed)
New Patient Office Visit  Subjective:  Patient ID: Dana Romero, female    DOB: 21-Jul-1950  Age: 70 y.o. MRN: [id number]  CC:  Chief Complaint  Patient presents with  . New Patient (Initial Visit)    HPI Dana Romero presents for new patient visit. Last PCP was Dr. Brigitte Pulse in [place].  She saw Dr. Rex Kras in [place] for PCP after that. No recent physical. Last labs were drawn 2 weeks ago.  She states her head is hurting and has been hurting for several weeks. She has been dizzy lately.  When she was raising her head up her pain became severe, and regardless of which way she moved her head, it was hurting.  She sees Dr. Nelva Bush, with ortho/pain management.  Past Medical History:  Diagnosis Date  . Anxiety   . Asthma   . Caregiver stress 07/04/2011  . Chronic pain syndrome   . Degenerative disc disease, cervical   . Depression   . Essential hypertension 1998  . GERD (gastroesophageal reflux disease)   . Gout   . History of stroke    September 2020  . PANCREATITIS, HX OF 06/08/2005   Annotation: alcohol induced Qualifier: Diagnosis of  By: Amil Amen MD, Benjamine Mola    . Pelvic mass   . Seasonal allergies   . Sinusitis, chronic   . Transfusion history   . Type 2 diabetes mellitus ([facility]) 1998    Past Surgical History:  Procedure Laterality Date  . APPENDECTOMY    . ARM DEBRIDEMENT Left    debridement of axilla "spider bite"  . BREAST SURGERY     breast cyst x2 -last '12  . CHOLECYSTECTOMY N/A 08/11/2015   Procedure: LAPAROSCOPIC CHOLECYSTECTOMY;  Surgeon: Arta Bruce Kinsinger, MD;  Location: [facility];  Service: General;  Laterality: N/A;  . TONSILLECTOMY    . TONSILLECTOMY AND ADENOIDECTOMY    . TUBAL LIGATION     post partum    Family History  Problem Relation Age of Onset  . Hypertension Mother   . Depression Mother   . Stomach cancer Mother 37  . Diabetes Maternal Grandmother   . Hypertension Maternal Grandmother   . Breast cancer Maternal Grandmother   . Heart attack  Maternal Grandmother   . Hypertension Brother   . Stroke Other   . Diabetes Maternal Aunt   . Breast cancer Maternal Aunt   . Diabetes Maternal Uncle   . Hypertension Maternal Uncle   . Hypertension Maternal Uncle     Social History   Socioeconomic History  . Marital status: Single    Spouse name: Not on file  . Number of children: Not on file  . Years of education: Not on file  . Highest education level: Not on file  Occupational History  . Occupation: Retired  Tobacco Use  . Smoking status: Never Smoker  . Smokeless tobacco: Never Used  Substance and Sexual Activity  . Alcohol use: No    Comment: none in many years -moderate use previous  . Drug use: No  . Sexual activity: Yes    Birth control/protection: Post-menopausal  Other Topics Concern  . Not on file  Social History Narrative  . Not on file   Social Determinants of Health   Financial Resource Strain: Not on file  Food Insecurity: Not on file  Transportation Needs: Not on file  Physical Activity: Not on file  Stress: Not on file  Social Connections: Not on file  Intimate Partner Violence: Not on file  ROS Review of Systems  Constitutional: Negative.   HENT: Negative.   Respiratory: Negative.   Cardiovascular: Negative.   Neurological: Positive for dizziness and headaches.    Objective:   Today's Vitals: BP (!) 155/94   Pulse 78   Temp 97.9 F (36.6 C)   Resp 16   Ht 5' 4"  (1.626 m)   Wt 137 lb (62.1 kg)   LMP 03/15/2014   SpO2 95%   BMI 23.52 kg/m   Physical Exam Constitutional:      Appearance: She is ill-appearing.  Cardiovascular:     Rate and Rhythm: Normal rate and regular rhythm.     Pulses: Normal pulses.     Heart sounds: Normal heart sounds.  Pulmonary:     Effort: Pulmonary effort is normal.     Breath sounds: Normal breath sounds.  Neurological:     General: No focal deficit present.     Mental Status: She is alert and oriented to person, place, and time.      Cranial Nerves: No cranial nerve deficit.     Sensory: No sensory deficit.     Motor: No weakness.     Coordination: Coordination normal.     Assessment & Plan:   Problem List Items Addressed This Visit      Respiratory   Allergic rhinitis    -chronic; takes zyrtec, benadryl PRN, and nasacort PRN -uses ketotifen (zaditor) eye drops for allergies -she states she has epi-pen for cholesterol medicines      Asthma    -not currently followed by pulmonology -uses albuterol inhaler  -has albuterol nebs PRN      Relevant Orders   CBC with Differential/Platelet   CMP14+EGFR     Digestive   Esophageal reflux    -no issues today -takes nexium 40 mg daily        Endocrine   DM II (diabetes mellitus, type II), controlled ([place])    -no A1c to review today -takes ozempic and was started on this recently -no other glycemic agents -not on ARB or statin d/t allergies      Relevant Orders   Hemoglobin A1c     Musculoskeletal and Integument   Osteoarthritis    -has chronic pain; managed by Dr. Nelva Bush -takes hydrocodone-APAP 10-325 q6h PRN from Dr. Nelva Bush        Other   Anxiety state    -states she needs alprazolam rx -she has been prescribed alprazolam 1 mg BID, and last fill was 05/05/20 -she is taking amitriptyline 25 mg daily for pain -taking fluoxetine 80 mg daily      Relevant Orders   Ambulatory referral to Psychiatry   Pure hypercholesterolemia    -no labs to review today; last drawn 2 weeks ago, but aren't available in epic -takes lovaza 2g BID      Relevant Orders   CBC with Differential/Platelet   CMP14+EGFR   Lipid Panel With LDL/HDL Ratio   Gout    -takes colchicine PRN for gout flare      Relevant Orders   Uric acid   Nausea    -no issues today -has used zofran and phenergan in the past -? Etiology of polypharmacy       Menopausal and female climacteric states    -states she is taking medroxyprogesterone 2.5 mg daily -also taking estradiol  0.05 mg/24 hr patch 2 times per week       Other Visit Diagnoses    Persistent headaches    -  Primary   Relevant Orders   Ambulatory referral to Neurology   CBC with Differential/Platelet   CMP14+EGFR   Encounter to establish care       Relevant Orders   CBC with Differential/Platelet   CMP14+EGFR   Hemoglobin A1c   Lipid Panel With LDL/HDL Ratio      Outpatient Encounter Medications as of 05/18/2020  Medication Sig  . ADVAIR HFA 115-21 MCG/ACT inhaler INHALE 2 PUFFS INTO LUNGS TWICE DAILY  . albuterol (PROVENTIL) (2.5 MG/3ML) 0.083% nebulizer solution USE ONE VIAL IN NEBULIZER EVERY 6 HOURS AS NEEDED FOR WHEEZING OR SHORTNESS OF BREATH  . albuterol (VENTOLIN HFA) 108 (90 Base) MCG/ACT inhaler INHALE ONE TO TWO PUFFS BY MOUTH EVERY 6 HOURS AS NEEDED FOR WHEEZING OR SHORTNESS OF BREATH (OR PERSISTENT COUGHING)  . Alcohol Swabs PADS Use to check blood sugar daily. E11.9  . amitriptyline (ELAVIL) 25 MG tablet Take 1 tablet (25 mg total) by mouth at bedtime.  . blood glucose meter kit and supplies KIT Dispense based on patient and insurance preference. Use once a day.  DX: E11.9.  . blood glucose meter kit and supplies Dispense based on patient and insurance preference. Use up to four times daily as directed. (FOR ICD-10 E11.9).  Marland Kitchen Blood Glucose Monitoring Suppl (FREESTYLE LITE) DEVI   . carvedilol (COREG) 25 MG tablet Take 1 tablet (25 mg total) by mouth 2 (two) times daily with a meal.  . cetirizine (ZYRTEC) 10 MG tablet Take 10 mg by mouth daily.  . Cinnamon 500 MG capsule Take 1,000 mg by mouth daily.  . clopidogrel (PLAVIX) 75 MG tablet Take 75 mg by mouth daily.  . colchicine 0.6 MG tablet Take 0.6 mg by mouth daily as needed (gout flare up).  . diphenhydrAMINE (BENADRYL) 25 mg capsule Take 25 mg by mouth at bedtime.  Marland Kitchen EPINEPHrine (EPIPEN 2-PAK) 0.3 mg/0.3 mL IJ SOAJ injection Inject 0.3 mLs (0.3 mg total) into the muscle once.  Marland Kitchen esomeprazole (NEXIUM) 40 MG capsule TAKE ONE  CAPSULE BY MOUTH EVERY DAY BEFORE BREAKFAST  . estradiol (VIVELLE-DOT) 0.05 MG/24HR patch Place 1 patch (0.05 mg total) onto the skin 2 (two) times a week.  Marland Kitchen FLUoxetine (PROZAC) 20 MG tablet Take 4 tablets (80 mg total) by mouth daily.  Marland Kitchen glucose blood test strip Test blood sugar daily.  DX: E11.9  . glucose monitoring kit (FREESTYLE) monitoring kit Test blood sugar daily. DX: E11.9  . HYDROcodone-acetaminophen (NORCO) 10-325 MG per tablet Take 1 tablet by mouth every 6 (six) hours as needed for moderate pain or severe pain.  Marland Kitchen ketotifen (ZADITOR) 0.025 % ophthalmic solution Place 1 drop into both eyes 2 (two) times daily as needed (Allergies).  Elmore Guise Devices (LANCING DEVICE) MISC Use to test blood sugar daily. E11.9  . Lancets MISC Test blood sugar daily. DX: E11.9  . losartan (COZAAR) 100 MG tablet Take 1 tablet (100 mg total) by mouth daily.  . magnesium oxide (MAG-OX) 400 MG tablet Take 1 tablet (400 mg total) by mouth 2 (two) times daily. (Patient taking differently: Take 400 mg by mouth 3 (three) times daily.)  . medroxyPROGESTERone (PROVERA) 2.5 MG tablet TAKE ONE TABLET BY MOUTH ONCE DAILY  . Multiple Vitamins-Minerals (MULTIVITAMIN ADULT PO) Take 1 tablet by mouth daily.  Marland Kitchen omega-3 acid ethyl esters (LOVAZA) 1 g capsule TAKE TWO CAPSULES BY MOUTH TWICE DAILY  . ondansetron (ZOFRAN) 8 MG tablet Take 1 tablet (8 mg total) by mouth every 8 (eight) hours as needed  for nausea.  Marland Kitchen OVER THE COUNTER MEDICATION Take 1 tablet by mouth daily. Focus Factor: Brain Function Support  . OZEMPIC, 1 MG/DOSE, 2 MG/1.5ML SOPN Inject 1 mg into the skin once a week.  . promethazine (PHENERGAN) 12.5 MG tablet TAKE ONE TABLET BY MOUTH EVERY 8 HOURS AS NEEDED. NEED CHECK UP/LABS FOR ADDITIONAL REFILLS  . Propylene Glycol-Glycerin (SOOTHE OP) Apply 1 drop to eye daily.  . sitaGLIPtin (JANUVIA) 50 MG tablet Take 1 tablet (50 mg total) by mouth daily.  Marland Kitchen thiamine (VITAMIN B-1) 100 MG tablet Take 100 mg by mouth  daily.  Marland Kitchen triamcinolone (NASACORT) 55 MCG/ACT nasal inhaler Place 2 sprays into the nose daily. (Patient taking differently: Place 2 sprays into the nose daily as needed (allergies).)  . vitamin C (ASCORBIC ACID) 500 MG tablet Take 500 mg by mouth daily.  . Vitamin D, Ergocalciferol, (DRISDOL) 50000 units CAPS capsule Take 50,000 Units by mouth every 7 (seven) days.  . vitamin E 400 UNIT capsule Take 400 Units by mouth daily.  . cephALEXin (KEFLEX) 500 MG capsule Take 1 capsule (500 mg total) by mouth 4 (four) times daily. (Patient not taking: Reported on 05/18/2020)  . ezetimibe (ZETIA) 10 MG tablet Take 1 tablet (10 mg total) by mouth daily.   No facility-administered encounter medications on file as of 05/18/2020.    Follow-up: Return in about 3 months (around 08/16/2020) for Complete physcial exam.   Noreene Larsson, NP

## 2020-05-18 NOTE — Assessment & Plan Note (Signed)
-  no issues today -takes nexium 40 mg daily

## 2020-05-18 NOTE — Assessment & Plan Note (Signed)
-  no issues today -has used zofran and phenergan in the past -? Etiology of polypharmacy

## 2020-05-18 NOTE — Assessment & Plan Note (Signed)
-  no labs to review today; last drawn 2 weeks ago, but aren't available in epic -takes lovaza 2g BID

## 2020-05-18 NOTE — Assessment & Plan Note (Signed)
-  takes colchicine PRN for gout flare

## 2020-05-18 NOTE — Assessment & Plan Note (Signed)
-  states she is taking medroxyprogesterone 2.5 mg daily -also taking estradiol 0.05 mg/24 hr patch 2 times per week

## 2020-05-20 ENCOUNTER — Telehealth: Payer: Self-pay

## 2020-05-20 NOTE — Telephone Encounter (Signed)
Is she having any neurological symptoms? Headache, fatigue, confusion? If she is having any of these, she will need to go to ED and they can do rapid imaging there. If he is concerned about her to the point that she needs a CT or MRI, she needs to be at the hospital/ED.

## 2020-05-20 NOTE — Telephone Encounter (Signed)
Pt husband called and states that she fell around 3:00 and he called EMS, they did come do an evaluation and she hit her head pretty hard. She refused to go to the hospital at that time. He would like to know if you can order a CT or MRI of her head.

## 2020-05-20 NOTE — Telephone Encounter (Signed)
Pt husband informed

## 2020-05-26 ENCOUNTER — Other Ambulatory Visit: Payer: Self-pay

## 2020-05-26 ENCOUNTER — Other Ambulatory Visit: Payer: Self-pay | Admitting: Nurse Practitioner

## 2020-05-26 ENCOUNTER — Other Ambulatory Visit (HOSPITAL_COMMUNITY)
Admission: RE | Admit: 2020-05-26 | Discharge: 2020-05-26 | Disposition: A | Discharge status: Home / Self Care | Payer: Medicare HMO | Source: Ambulatory Visit | Attending: Nurse Practitioner | Admitting: Nurse Practitioner

## 2020-05-26 DIAGNOSIS — E7800 Pure hypercholesterolemia, unspecified: Secondary | ICD-10-CM | POA: Insufficient documentation

## 2020-05-26 DIAGNOSIS — Z7689 Persons encountering health services in other specified circumstances: Secondary | ICD-10-CM | POA: Diagnosis present

## 2020-05-26 DIAGNOSIS — M109 Gout, unspecified: Secondary | ICD-10-CM | POA: Insufficient documentation

## 2020-05-26 DIAGNOSIS — E1142 Type 2 diabetes mellitus with diabetic polyneuropathy: Secondary | ICD-10-CM | POA: Diagnosis present

## 2020-05-26 DIAGNOSIS — R7989 Other specified abnormal findings of blood chemistry: Secondary | ICD-10-CM

## 2020-05-26 LAB — CBC WITH DIFFERENTIAL/PLATELET
Abs Immature Granulocytes: 0.03 10*3/uL (ref 0.00–0.07)
Basophils Absolute: 0 10*3/uL (ref 0.0–0.1)
Basophils Relative: 0 %
Eosinophils Absolute: 0.2 10*3/uL (ref 0.0–0.5)
Eosinophils Relative: 2 %
HCT: 41.1 % (ref 36.0–46.0)
Hemoglobin: 13 g/dL (ref 12.0–15.0)
Immature Granulocytes: 0 %
Lymphocytes Relative: 16 %
Lymphs Abs: 1.5 10*3/uL (ref 0.7–4.0)
MCH: 27 pg (ref 26.0–34.0)
MCHC: 31.6 g/dL (ref 30.0–36.0)
MCV: 85.4 fL (ref 80.0–100.0)
Monocytes Absolute: 0.5 10*3/uL (ref 0.1–1.0)
Monocytes Relative: 6 %
Neutro Abs: 7 10*3/uL (ref 1.7–7.7)
Neutrophils Relative %: 76 %
Platelets: 365 10*3/uL (ref 150–400)
RBC: 4.81 MIL/uL (ref 3.87–5.11)
RDW: 14.3 % (ref 11.5–15.5)
WBC: 9.2 10*3/uL (ref 4.0–10.5)
nRBC: 0 % (ref 0.0–0.2)

## 2020-05-26 LAB — COMPREHENSIVE METABOLIC PANEL
ALT: 68 U/L — ABNORMAL HIGH (ref 0–44)
AST: 91 U/L — ABNORMAL HIGH (ref 15–41)
Albumin: 3.7 g/dL (ref 3.5–5.0)
Alkaline Phosphatase: 63 U/L (ref 38–126)
Anion gap: 10 (ref 5–15)
BUN: 15 mg/dL (ref 8–23)
CO2: 25 mmol/L (ref 22–32)
Calcium: 9.2 mg/dL (ref 8.9–10.3)
Chloride: 103 mmol/L (ref 98–111)
Creatinine, Ser: 1.25 mg/dL — ABNORMAL HIGH (ref 0.44–1.00)
GFR, Estimated: 46 mL/min — ABNORMAL LOW (ref >60)
Glucose, Bld: 116 mg/dL — ABNORMAL HIGH (ref 70–99)
Potassium: 4.3 mmol/L (ref 3.5–5.1)
Sodium: 138 mmol/L (ref 135–145)
Total Bilirubin: 0.4 mg/dL (ref 0.3–1.2)
Total Protein: 7.7 g/dL (ref 6.5–8.1)

## 2020-05-26 LAB — LIPID PANEL
Cholesterol: 224 mg/dL — ABNORMAL HIGH (ref 0–200)
HDL: 53 mg/dL (ref >40)
LDL Cholesterol: 150 mg/dL — ABNORMAL HIGH (ref 0–99)
Total CHOL/HDL Ratio: 4.2 RATIO
Triglycerides: 103 mg/dL (ref <150)
VLDL: 21 mg/dL (ref 0–40)

## 2020-05-26 LAB — HEMOGLOBIN A1C
Hgb A1c MFr Bld: 5.7 % — ABNORMAL HIGH (ref 4.8–5.6)
Mean Plasma Glucose: 116.89 mg/dL

## 2020-05-26 LAB — URIC ACID: Uric Acid, Serum: 4.4 mg/dL (ref 2.5–7.1)

## 2020-05-26 NOTE — Progress Notes (Signed)
Her LDL is elevated, but that has been an ongoing issue and she is statin and zetia intolerant.  Her liver function labs are elevated as well. I will get her in with GI if she isn't already seeing one. This is likely related to her ongoing elevated cholesterol and fatty liver, but we will let the GI doctor confirm that.

## 2020-05-26 NOTE — Progress Notes (Signed)
Her blood counts look great

## 2020-05-26 NOTE — Progress Notes (Signed)
Her A1c is great at 5.7%.

## 2020-05-27 ENCOUNTER — Other Ambulatory Visit: Payer: Self-pay

## 2020-05-27 DIAGNOSIS — R7989 Other specified abnormal findings of blood chemistry: Secondary | ICD-10-CM

## 2020-05-27 DIAGNOSIS — R110 Nausea: Secondary | ICD-10-CM

## 2020-06-18 ENCOUNTER — Ambulatory Visit: Payer: Medicare Other | Admitting: Cardiology

## 2020-06-30 ENCOUNTER — Emergency Department (HOSPITAL_COMMUNITY)
Admission: EM | Admit: 2020-06-30 | Discharge: 2020-06-30 | Disposition: A | Discharge status: Home / Self Care | Payer: Medicare HMO | Attending: Emergency Medicine | Admitting: Emergency Medicine

## 2020-06-30 ENCOUNTER — Other Ambulatory Visit: Payer: Self-pay

## 2020-06-30 ENCOUNTER — Emergency Department (HOSPITAL_COMMUNITY): Payer: Medicare HMO

## 2020-06-30 ENCOUNTER — Encounter (HOSPITAL_COMMUNITY): Payer: Self-pay

## 2020-06-30 DIAGNOSIS — Z91040 Latex allergy status: Secondary | ICD-10-CM | POA: Diagnosis not present

## 2020-06-30 DIAGNOSIS — Z7984 Long term (current) use of oral hypoglycemic drugs: Secondary | ICD-10-CM | POA: Diagnosis not present

## 2020-06-30 DIAGNOSIS — M79645 Pain in left finger(s): Secondary | ICD-10-CM | POA: Diagnosis present

## 2020-06-30 DIAGNOSIS — L03818 Cellulitis of other sites: Secondary | ICD-10-CM | POA: Diagnosis not present

## 2020-06-30 DIAGNOSIS — E1122 Type 2 diabetes mellitus with diabetic chronic kidney disease: Secondary | ICD-10-CM | POA: Insufficient documentation

## 2020-06-30 DIAGNOSIS — J45909 Unspecified asthma, uncomplicated: Secondary | ICD-10-CM | POA: Diagnosis not present

## 2020-06-30 DIAGNOSIS — N189 Chronic kidney disease, unspecified: Secondary | ICD-10-CM | POA: Insufficient documentation

## 2020-06-30 DIAGNOSIS — I129 Hypertensive chronic kidney disease with stage 1 through stage 4 chronic kidney disease, or unspecified chronic kidney disease: Secondary | ICD-10-CM | POA: Insufficient documentation

## 2020-06-30 DIAGNOSIS — Z7902 Long term (current) use of antithrombotics/antiplatelets: Secondary | ICD-10-CM | POA: Insufficient documentation

## 2020-06-30 DIAGNOSIS — M109 Gout, unspecified: Secondary | ICD-10-CM | POA: Diagnosis not present

## 2020-06-30 DIAGNOSIS — E1140 Type 2 diabetes mellitus with diabetic neuropathy, unspecified: Secondary | ICD-10-CM | POA: Insufficient documentation

## 2020-06-30 DIAGNOSIS — Z7951 Long term (current) use of inhaled steroids: Secondary | ICD-10-CM | POA: Diagnosis not present

## 2020-06-30 LAB — CBC WITH DIFFERENTIAL/PLATELET
Abs Immature Granulocytes: 0.05 10*3/uL (ref 0.00–0.07)
Basophils Absolute: 0.1 10*3/uL (ref 0.0–0.1)
Basophils Relative: 1 %
Eosinophils Absolute: 0.2 10*3/uL (ref 0.0–0.5)
Eosinophils Relative: 2 %
HCT: 39 % (ref 36.0–46.0)
Hemoglobin: 12.4 g/dL (ref 12.0–15.0)
Immature Granulocytes: 1 %
Lymphocytes Relative: 18 %
Lymphs Abs: 1.8 10*3/uL (ref 0.7–4.0)
MCH: 27 pg (ref 26.0–34.0)
MCHC: 31.8 g/dL (ref 30.0–36.0)
MCV: 84.8 fL (ref 80.0–100.0)
Monocytes Absolute: 0.7 10*3/uL (ref 0.1–1.0)
Monocytes Relative: 7 %
Neutro Abs: 7.1 10*3/uL (ref 1.7–7.7)
Neutrophils Relative %: 71 %
Platelets: 310 10*3/uL (ref 150–400)
RBC: 4.6 MIL/uL (ref 3.87–5.11)
RDW: 14 % (ref 11.5–15.5)
WBC: 9.9 10*3/uL (ref 4.0–10.5)
nRBC: 0 % (ref 0.0–0.2)

## 2020-06-30 LAB — COMPREHENSIVE METABOLIC PANEL
ALT: 43 U/L (ref 0–44)
AST: 51 U/L — ABNORMAL HIGH (ref 15–41)
Albumin: 3.6 g/dL (ref 3.5–5.0)
Alkaline Phosphatase: 70 U/L (ref 38–126)
Anion gap: 8 (ref 5–15)
BUN: 20 mg/dL (ref 8–23)
CO2: 24 mmol/L (ref 22–32)
Calcium: 8.8 mg/dL — ABNORMAL LOW (ref 8.9–10.3)
Chloride: 103 mmol/L (ref 98–111)
Creatinine, Ser: 1.27 mg/dL — ABNORMAL HIGH (ref 0.44–1.00)
GFR, Estimated: 45 mL/min — ABNORMAL LOW (ref >60)
Glucose, Bld: 113 mg/dL — ABNORMAL HIGH (ref 70–99)
Potassium: 3.7 mmol/L (ref 3.5–5.1)
Sodium: 135 mmol/L (ref 135–145)
Total Bilirubin: 0.5 mg/dL (ref 0.3–1.2)
Total Protein: 7.2 g/dL (ref 6.5–8.1)

## 2020-06-30 LAB — LACTIC ACID, PLASMA: Lactic Acid, Venous: 1 mmol/L (ref 0.5–1.9)

## 2020-06-30 MED ORDER — COLCHICINE 0.6 MG PO TABS: 0.6000 mg | ORAL_TABLET | Freq: Every day | ORAL | 0 refills | Status: DC

## 2020-06-30 MED ORDER — HYDROCODONE-ACETAMINOPHEN 5-325 MG PO TABS
1.0000 | ORAL_TABLET | Freq: Once | ORAL | Status: AC
  Administered 2020-06-30: 1 via ORAL
  Filled 2020-06-30: qty 1

## 2020-06-30 MED ORDER — CEPHALEXIN 500 MG PO CAPS
500.0000 mg | ORAL_CAPSULE | Freq: Once | ORAL | Status: AC
  Administered 2020-06-30: 500 mg via ORAL
  Filled 2020-06-30: qty 1

## 2020-06-30 MED ORDER — CEPHALEXIN 500 MG PO CAPS: 500.0000 mg | ORAL_CAPSULE | Freq: Three times a day (TID) | ORAL | 0 refills | Status: AC

## 2020-06-30 NOTE — ED Triage Notes (Signed)
Pt to er, pt states that she was sent over from urgent care for some redness and swelling in her L thumb, states that the pain in her thumb woke her from sleep this am.

## 2020-06-30 NOTE — Discharge Instructions (Signed)
Your workup was reassuring today without signs of infection into the joint. Given the small amount of red streaking we will treat you as if you have a skin infection today - please take antibiotics as prescribed.   I have also prescribed colchicine for you for possible gout flare.   Follow up with your PCP next week for follow up/reevaluation of your thumb.   Return to the ED IMMEDIATELY for any worsening symptoms including worsening redness up your arm, fevers > 100.4, chills, or any other new/concerning symptoms.

## 2020-06-30 NOTE — ED Provider Notes (Signed)
[facility] EMERGENCY DEPARTMENT Provider Note   CSN: [id number] Arrival date & time: 06/30/20  1646     History Chief Complaint  Patient presents with  . Hand Pain    Dana Romero is a 70 y.o. female with PMHx diabetes, gout, GERD, chronic pain syndrome who presents to the ED today from urgent care with complaint of pain to left thumb that began last night. Pt also complains of redness and swelling to the joint. She states that she started noticing red streaking up her wrist today and went to an urgent care for further evaluation - pt was sent here with concern for possible septic arthritis. She denies any fevers or chills. She reports she has had gout in every joint of all of her fingers and this feels similar. No other complaints at this time.   The history is provided by the patient and medical records.       Past Medical History:  Diagnosis Date  . Anxiety   . Asthma   . Caregiver stress 07/04/2011  . Chronic pain syndrome   . Degenerative disc disease, cervical   . Depression   . Essential hypertension 1998  . GERD (gastroesophageal reflux disease)   . Gout   . History of stroke    September 2020  . PANCREATITIS, HX OF 06/08/2005   Annotation: alcohol induced Qualifier: Diagnosis of  By: Amil Amen MD, Benjamine Mola    . Pelvic mass   . Seasonal allergies   . Sinusitis, chronic   . Transfusion history   . Type 2 diabetes mellitus ([place]) 1998    Patient Active Problem List   Diagnosis Date Noted  . Gout 05/18/2020  . Esophageal reflux 05/18/2020  . Asthma 05/18/2020  . Nausea 05/18/2020  . Menopausal and female climacteric states 05/18/2020  . Pure hypercholesterolemia 04/18/2016  . Allergic rhinitis 05/09/2012  . Essential hypertension 07/26/2007  . DM II (diabetes mellitus, type II), controlled ([place]) 11/20/2006    Class: History of  . Anxiety state 11/20/2006  . Chronic kidney disease 11/20/2006  . Osteoarthritis 11/20/2006  . DIABETIC PERIPHERAL NEUROPATHY  02/21/2006    Past Surgical History:  Procedure Laterality Date  . APPENDECTOMY    . ARM DEBRIDEMENT Left    debridement of axilla "spider bite"  . BREAST SURGERY     breast cyst x2 -last '12  . CHOLECYSTECTOMY N/A 08/11/2015   Procedure: LAPAROSCOPIC CHOLECYSTECTOMY;  Surgeon: Arta Bruce Kinsinger, MD;  Location: [facility] ORS;  Service: General;  Laterality: N/A;  . TONSILLECTOMY    . TONSILLECTOMY AND ADENOIDECTOMY    . TUBAL LIGATION     post partum     OB History    Gravida  3   Para  2   Term      Preterm      AB  1   Living        SAB      IAB      Ectopic      Multiple      Live Births              Family History  Problem Relation Age of Onset  . Hypertension Mother   . Depression Mother   . Stomach cancer Mother 26  . Diabetes Maternal Grandmother   . Hypertension Maternal Grandmother   . Breast cancer Maternal Grandmother   . Heart attack Maternal Grandmother   . Hypertension Brother   . Stroke Other   . Diabetes Maternal Aunt   .  Breast cancer Maternal Aunt   . Diabetes Maternal Uncle   . Hypertension Maternal Uncle   . Hypertension Maternal Uncle     Social History   Tobacco Use  . Smoking status: Never Smoker  . Smokeless tobacco: Never Used  Substance Use Topics  . Alcohol use: No  . Drug use: No    Home Medications Prior to Admission medications   Medication Sig Start Date End Date Taking? Authorizing Provider  cephALEXin (KEFLEX) 500 MG capsule Take 1 capsule (500 mg total) by mouth 3 (three) times daily for 7 days. 06/30/20 07/07/20 Yes Elajah Kunsman, PA-C  colchicine 0.6 MG tablet Take 1 tablet (0.6 mg total) by mouth daily for 7 days. 06/30/20 07/07/20 Yes Hilmar Moldovan, PA-C  [facility] 115-21 MCG/ACT inhaler INHALE 2 PUFFS INTO LUNGS TWICE DAILY 04/05/17   Wardell Honour, MD  albuterol (PROVENTIL) (2.5 MG/3ML) 0.083% nebulizer solution USE ONE VIAL IN NEBULIZER EVERY 6 HOURS AS NEEDED FOR WHEEZING OR SHORTNESS OF BREATH  09/25/15   Wardell Honour, MD  albuterol (VENTOLIN HFA) 108 (90 Base) MCG/ACT inhaler INHALE ONE TO TWO PUFFS BY MOUTH EVERY 6 HOURS AS NEEDED FOR WHEEZING OR SHORTNESS OF BREATH (OR PERSISTENT COUGHING) 01/18/16   Wardell Honour, MD  Alcohol Swabs PADS Use to check blood sugar daily. E11.9 04/07/15   Wardell Honour, MD  amitriptyline (ELAVIL) 25 MG tablet Take 1 tablet (25 mg total) by mouth at bedtime. 12/15/15   Wardell Honour, MD  blood glucose meter kit and supplies KIT Dispense based on patient and insurance preference. Use once a day.  DX: E11.9. 12/31/14   Wardell Honour, MD  blood glucose meter kit and supplies Dispense based on patient and insurance preference. Use up to four times daily as directed. (FOR ICD-10 E11.9). 03/31/15   Wardell Honour, MD  Blood Glucose Monitoring Suppl (FREESTYLE LITE) DEVI  01/12/15   [provider]  carvedilol (COREG) 25 MG tablet Take 1 tablet (25 mg total) by mouth 2 (two) times daily with a meal. 01/18/16   Wardell Honour, MD  cetirizine (ZYRTEC) 10 MG tablet Take 10 mg by mouth daily.    [provider]  Cinnamon 500 MG capsule Take 1,000 mg by mouth daily.    [provider]  clopidogrel (PLAVIX) 75 MG tablet Take 75 mg by mouth daily.    [provider]  colchicine 0.6 MG tablet Take 0.6 mg by mouth daily as needed (gout flare up).    [provider]  diphenhydrAMINE (BENADRYL) 25 mg capsule Take 25 mg by mouth at bedtime.    [provider]  EPINEPHrine (EPIPEN 2-PAK) 0.3 mg/0.3 mL IJ SOAJ injection Inject 0.3 mLs (0.3 mg total) into the muscle once. 01/07/15   Wardell Honour, MD  esomeprazole (NEXIUM) 40 MG capsule TAKE ONE CAPSULE BY MOUTH EVERY DAY BEFORE BREAKFAST 04/25/17   Wardell Honour, MD  estradiol (VIVELLE-DOT) 0.05 MG/24HR patch Place 1 patch (0.05 mg total) onto the skin 2 (two) times a week. 01/20/16   Wardell Honour, MD  ezetimibe (ZETIA) 10 MG tablet Take 1 tablet (10 mg total) by  mouth daily. 04/15/19 07/14/19  Satira Sark, MD  FLUoxetine (PROZAC) 20 MG tablet Take 4 tablets (80 mg total) by mouth daily. 04/11/16   Wardell Honour, MD  glucose blood test strip Test blood sugar daily.  DX: E11.9 04/06/15   Wardell Honour, MD  glucose monitoring kit (  FREESTYLE) monitoring kit Test blood sugar daily. DX: E11.9 01/12/15   Wardell Honour, MD  HYDROcodone-acetaminophen [facility]) 10-325 MG per tablet Take 1 tablet by mouth every 6 (six) hours as needed for moderate pain or severe pain.    [provider]  ketotifen (ZADITOR) 0.025 % ophthalmic solution Place 1 drop into both eyes 2 (two) times daily as needed (Allergies).    [provider]  Lancet Devices (LANCING DEVICE) MISC Use to test blood sugar daily. E11.9 04/07/15   Wardell Honour, MD  Lancets MISC Test blood sugar daily. DX: E11.9 04/06/15   Wardell Honour, MD  losartan (COZAAR) 100 MG tablet Take 1 tablet (100 mg total) by mouth daily. 01/19/16   Wardell Honour, MD  magnesium oxide (MAG-OX) 400 MG tablet Take 1 tablet (400 mg total) by mouth 2 (two) times daily. Patient taking differently: Take 400 mg by mouth 3 (three) times daily. 07/21/12   Barton Fanny, MD  medroxyPROGESTERone (PROVERA) 2.5 MG tablet TAKE ONE TABLET BY MOUTH ONCE DAILY 10/14/15   Wardell Honour, MD  Multiple Vitamins-Minerals (MULTIVITAMIN ADULT PO) Take 1 tablet by mouth daily.    [provider]  omega-3 acid ethyl esters (LOVAZA) 1 g capsule TAKE TWO CAPSULES BY MOUTH TWICE DAILY 08/05/15   Wardell Honour, MD  ondansetron (ZOFRAN) 8 MG tablet Take 1 tablet (8 mg total) by mouth every 8 (eight) hours as needed for nausea. 01/22/20   Florian Buff, MD  OVER THE COUNTER MEDICATION Take 1 tablet by mouth daily. Focus Factor: Brain Function Support    [provider]  OZEMPIC, 1 MG/DOSE, 2 MG/1.5ML SOPN Inject 1 mg into the skin once a week. 10/11/19   [provider]  promethazine (PHENERGAN) 12.5 MG  tablet TAKE ONE TABLET BY MOUTH EVERY 8 HOURS AS NEEDED. NEED CHECK UP/LABS FOR ADDITIONAL REFILLS 11/04/15   Wardell Honour, MD  Propylene Glycol-Glycerin (SOOTHE OP) Apply 1 drop to eye daily.    [provider]  sitaGLIPtin (JANUVIA) 50 MG tablet Take 1 tablet (50 mg total) by mouth daily. 05/11/15   Wardell Honour, MD  thiamine (VITAMIN B-1) 100 MG tablet Take 100 mg by mouth daily.    [provider]  triamcinolone (NASACORT) 55 MCG/ACT nasal inhaler Place 2 sprays into the nose daily. Patient taking differently: Place 2 sprays into the nose daily as needed (allergies). 09/03/12   Barton Fanny, MD  vitamin C (ASCORBIC ACID) 500 MG tablet Take 500 mg by mouth daily.    [provider]  Vitamin D, Ergocalciferol, (DRISDOL) 50000 units CAPS capsule Take 50,000 Units by mouth every 7 (seven) days.    [provider]  vitamin E 400 UNIT capsule Take 400 Units by mouth daily.    [provider]    Allergies    Lyrica [pregabalin], Amlodipine besylate, Atorvastatin, Ezetimibe-simvastatin, Losartan potassium-hctz, Morphine and related, Niacin, Rosuvastatin, Toprol xl [metoprolol succinate], Valium [diazepam], and Latex  Review of Systems   Review of Systems  Constitutional: Negative for chills and fever.  Musculoskeletal: Positive for arthralgias and joint swelling.  Skin: Positive for color change.  Neurological: Negative for weakness and numbness.  All other systems reviewed and are negative.   Physical Exam Updated Vital Signs BP 116/88   Pulse 89   Temp 98.1 F (36.7 C) (Oral)   Resp 18   Ht 5' 4"  (1.626 m)   Wt 63.5 kg   LMP 03/15/2014  SpO2 100%   BMI 24.03 kg/m   Physical Exam Vitals and nursing note reviewed.  Constitutional:      Appearance: She is not ill-appearing or toxic-appearing.  HENT:     Head: Normocephalic and atraumatic.  Eyes:     Conjunctiva/sclera: Conjunctivae normal.  Cardiovascular:     Rate and  Rhythm: Normal rate and regular rhythm.     Pulses: Normal pulses.  Pulmonary:     Effort: Pulmonary effort is normal.     Breath sounds: Normal breath sounds. No wheezing, rhonchi or rales.  Abdominal:     Palpations: Abdomen is soft.     Tenderness: There is no abdominal tenderness.  Musculoskeletal:     Cervical back: Neck supple.     Comments: Erythema and edema noted to MCP joint of left 1st digit; ROM limited s/2 pain. + small amount of red streaking along the radial aspect of the left hand extending just proximal to the wrist. ROM intact to wrist without any TTP. Radial pulse 2+.   Skin:    General: Skin is warm and dry.  Neurological:     Mental Status: She is alert.     ED Results / Procedures / Treatments   Labs (all labs ordered are listed, but only abnormal results are displayed) Labs Reviewed  COMPREHENSIVE METABOLIC PANEL - Abnormal; Notable for the following components:      Result Value   Glucose, Bld 113 (*)    Creatinine, Ser 1.27 (*)    Calcium 8.8 (*)    AST 51 (*)    GFR, Estimated 45 (*)    All other components within normal limits  CBC WITH DIFFERENTIAL/PLATELET  LACTIC ACID, PLASMA  LACTIC ACID, PLASMA    EKG None  Radiology DG Finger Thumb Left  Result Date: 06/30/2020 CLINICAL DATA:  Erythema and swelling since yesterday EXAM: LEFT THUMB 2+V COMPARISON:  None. FINDINGS: Frontal, oblique, and lateral views of the left thumb are obtained. No fracture, subluxation, or dislocation. Significant osteoarthritis of the metacarpophalangeal and interphalangeal joints. Soft tissues are unremarkable. IMPRESSION: 1. Osteoarthritis.  No acute bony abnormality. Electronically Signed   By: Randa Ngo M.D.   On: 06/30/2020 19:10    Procedures Procedures   Medications Ordered in ED Medications  HYDROcodone-acetaminophen (NORCO/VICODIN) 5-325 MG per tablet 1 tablet (has no administration in time range)  cephALEXin (KEFLEX) capsule 500 mg (has no  administration in time range)    ED Course  I have reviewed the triage vital signs and the nursing notes.  Pertinent labs & imaging results that were available during my care of the patient were reviewed by me and considered in my medical decision making (see chart for details).    MDM Rules/Calculators/A&P                          70 year old female presents to the ED today from urgent care with concern for possible septic arthritis.  Began having pain to the MCP joint of the first digit on the left hand last night, history of gout and states this feels similar.  Did start having some red streaking today prompting her to go to urgent care and was sent here with concern for septic arthritis.  Patient denies fevers or chills.  On arrival to the ED she is nontoxic-appearing.  She is afebrile, nontachycardic nontachypneic and appears to be in no acute distress.  On exam she does have erythema noted to the MCP  joint with edema and limited range of motion secondary to pain and swelling.  She also is noted to have some red streaking to the radial aspect of her left wrist which extends just proximal to the wrist joint itself, range of motion of the wrist is intact.  Given patient is not having any fevers or chills and is overall well-appearing I have lower suspicion for septic arthritis as this would be very atypical in the thumb.  However will work-up with labs including CBC, CMP, lactic acid and x-ray.  If no acute findings will treat for skin infection given history of diabetes to be on the safe side as well as patient's gout.  She does have a history of diabetes, states the colchicine typically works well for her.  Xray negative for acute findings at this time.  Does have some osteoarthritis. CBC without leukocytosis at 9.9.  Hemoglobin stable at 12.4. CMP with a stable creatinine 1.27.  No other electrolyte abnormalities at this time. Lactic acid within normal limits at 1.0.  Work up overall  reassuring today.  I would suspect some sort of lab work abnormality including leukocytosis or lactic acidosis with a septic arthritis as well as x-ray changes.  We will plan to discharge home at this time with antibiotics and colchicine for gout with PCP follow-up.  Have discussed case with attending physician Dr. Dina Rich who agrees with plan.  Have updated patient on plan, she is agreement to follow-up with her PCP next week.  She has been instructed on strict return precautions.  Stable for discharge.  This note was prepared using Dragon voice recognition software and may include unintentional dictation errors due to the inherent limitations of voice recognition software.   Final Clinical Impression(s) / ED Diagnoses Final diagnoses:  Thumb pain, left  Cellulitis of other specified site  Acute gout, unspecified cause, unspecified site    Rx / DC Orders ED Discharge Orders         Ordered    cephALEXin (KEFLEX) 500 MG capsule  3 times daily        06/30/20 2000    colchicine 0.6 MG tablet  Daily        06/30/20 2000           Discharge Instructions     Your workup was reassuring today without signs of infection into the joint. Given the small amount of red streaking we will treat you as if you have a skin infection today - please take antibiotics as prescribed.   I have also prescribed colchicine for you for possible gout flare.   Follow up with your PCP next week for follow up/reevaluation of your thumb.   Return to the ED IMMEDIATELY for any worsening symptoms including worsening redness up your arm, fevers > 100.4, chills, or any other new/concerning symptoms.        Eustaquio Maize, PA-C 06/30/20 2001    Lorelle Gibbs, DO 06/30/20 2351

## 2020-07-09 ENCOUNTER — Telehealth: Payer: Self-pay

## 2020-07-09 ENCOUNTER — Encounter: Payer: Self-pay | Admitting: Diagnostic Neuroimaging

## 2020-07-09 ENCOUNTER — Ambulatory Visit: Payer: Medicare HMO | Admitting: Diagnostic Neuroimaging

## 2020-07-09 NOTE — Telephone Encounter (Signed)
Patient no showed 07/09/20 appt with Dr. Marjory Lies.

## 2020-07-14 ENCOUNTER — Encounter: Payer: Self-pay | Admitting: Internal Medicine

## 2020-07-14 ENCOUNTER — Ambulatory Visit: Payer: Medicare HMO | Admitting: Nurse Practitioner

## 2020-07-14 NOTE — Progress Notes (Deleted)
Primary Care Physician:  Percell Belt, DO Primary Gastroenterologist:  Dr. Gala Romney  No chief complaint on file.   HPI:   Dana Romero is a 70 y.o. female who presents on referral from primary care for elevated LFTs.  Reviewed information associated with referral including office visit dated 05/18/2020 which was a visit to establish care.  Noted ongoing pain issues, currently sees Ortho/pain management.  History of GERD but no issues at that time on Nexium 40 mg daily.  Also history of diabetes.  Recommended routine labs including CBC, CMP, hemoglobin A1c, and others.  Labs are completed 05/26/2020.  CMP found stable but elevated creatinine of 1.25, elevated LFTs with AST/ALT at 91/68 but normal alk phos (63), and bilirubin (0.4).  Noted previous bumps in transaminases over the past 6 months but appeared normal prior to then.  Her CBC was normal.  Her hemoglobin A1c was significantly improved at 5.7 (4 years ago was 8.1).  Repeat CMP in the emergency department for thumb issues found again stable creatinine 1.27, improved transaminases with AST elevated mildly at 51. ALT, alk phos, bilirubin normal.  Recent CT of the abdomen 01/16/2020 with no focal liver abnormality, status post cholecystectomy, no biliary dilation.  Concerning findings of soft tissue mass involving the vagina concerning for malignancy and recommended visual inspection and pelvic exam.  No history of colonoscopy or endoscopy in our system.  Today she states she is doing okay overall.   Past Medical History:  Diagnosis Date  . Anxiety   . Asthma   . Caregiver stress 07/04/2011  . Chronic pain syndrome   . Degenerative disc disease, cervical   . Depression   . Essential hypertension 1998  . GERD (gastroesophageal reflux disease)   . Gout   . History of stroke    September 2020  . PANCREATITIS, HX OF 06/08/2005   Annotation: alcohol induced Qualifier: Diagnosis of  By: Amil Amen MD, Benjamine Mola    . Pelvic mass   .  Seasonal allergies   . Sinusitis, chronic   . Transfusion history   . Type 2 diabetes mellitus ([place]) 1998    Past Surgical History:  Procedure Laterality Date  . APPENDECTOMY    . ARM DEBRIDEMENT Left    debridement of axilla "spider bite"  . BREAST SURGERY     breast cyst x2 -last '12  . CHOLECYSTECTOMY N/A 08/11/2015   Procedure: LAPAROSCOPIC CHOLECYSTECTOMY;  Surgeon: Arta Bruce Kinsinger, MD;  Location: [facility];  Service: General;  Laterality: N/A;  . TONSILLECTOMY    . TONSILLECTOMY AND ADENOIDECTOMY    . TUBAL LIGATION     post partum    Current Outpatient Medications  Medication Sig Dispense Refill  . ADVAIR HFA 115-21 MCG/ACT inhaler INHALE 2 PUFFS INTO LUNGS TWICE DAILY 12 Inhaler 0  . albuterol (PROVENTIL) (2.5 MG/3ML) 0.083% nebulizer solution USE ONE VIAL IN NEBULIZER EVERY 6 HOURS AS NEEDED FOR WHEEZING OR SHORTNESS OF BREATH 75 mL 0  . albuterol (VENTOLIN HFA) 108 (90 Base) MCG/ACT inhaler INHALE ONE TO TWO PUFFS BY MOUTH EVERY 6 HOURS AS NEEDED FOR WHEEZING OR SHORTNESS OF BREATH (OR PERSISTENT COUGHING) 18 each 2  . Alcohol Swabs PADS Use to check blood sugar daily. E11.9 100 each 3  . amitriptyline (ELAVIL) 25 MG tablet Take 1 tablet (25 mg total) by mouth at bedtime. 30 tablet 0  . blood glucose meter kit and supplies KIT Dispense based on patient and insurance preference. Use once a day.  DX: E11.9. 1 each 0  . blood glucose meter kit and supplies Dispense based on patient and insurance preference. Use up to four times daily as directed. (FOR ICD-10 E11.9). 1 each 0  . Blood Glucose Monitoring Suppl (FREESTYLE LITE) DEVI     . carvedilol (COREG) 25 MG tablet Take 1 tablet (25 mg total) by mouth 2 (two) times daily with a meal. 180 tablet 3  . cetirizine (ZYRTEC) 10 MG tablet Take 10 mg by mouth daily.    . Cinnamon 500 MG capsule Take 1,000 mg by mouth daily.    . clopidogrel (PLAVIX) 75 MG tablet Take 75 mg by mouth daily.    . colchicine 0.6 MG tablet Take 0.6 mg  by mouth daily as needed (gout flare up).    . colchicine 0.6 MG tablet Take 1 tablet (0.6 mg total) by mouth daily for 7 days. 7 tablet 0  . diphenhydrAMINE (BENADRYL) 25 mg capsule Take 25 mg by mouth at bedtime.    Marland Kitchen EPINEPHrine (EPIPEN 2-PAK) 0.3 mg/0.3 mL IJ SOAJ injection Inject 0.3 mLs (0.3 mg total) into the muscle once. 1 Device 1  . esomeprazole (NEXIUM) 40 MG capsule TAKE ONE CAPSULE BY MOUTH EVERY DAY BEFORE BREAKFAST 90 capsule 0  . estradiol (VIVELLE-DOT) 0.05 MG/24HR patch Place 1 patch (0.05 mg total) onto the skin 2 (two) times a week. 8 patch prn  . ezetimibe (ZETIA) 10 MG tablet Take 1 tablet (10 mg total) by mouth daily. 90 tablet 1  . FLUoxetine (PROZAC) 20 MG tablet Take 4 tablets (80 mg total) by mouth daily. 360 tablet 0  . glucose blood test strip Test blood sugar daily.  DX: E11.9 100 each 2  . glucose monitoring kit (FREESTYLE) monitoring kit Test blood sugar daily. DX: E11.9 1 each 0  . HYDROcodone-acetaminophen (NORCO) 10-325 MG per tablet Take 1 tablet by mouth every 6 (six) hours as needed for moderate pain or severe pain.    Marland Kitchen ketotifen (ZADITOR) 0.025 % ophthalmic solution Place 1 drop into both eyes 2 (two) times daily as needed (Allergies).    Elmore Guise Devices (LANCING DEVICE) MISC Use to test blood sugar daily. E11.9 1 each 0  . Lancets MISC Test blood sugar daily. DX: E11.9 100 each 2  . losartan (COZAAR) 100 MG tablet Take 1 tablet (100 mg total) by mouth daily. 90 tablet 1  . magnesium oxide (MAG-OX) 400 MG tablet Take 1 tablet (400 mg total) by mouth 2 (two) times daily. (Patient taking differently: Take 400 mg by mouth 3 (three) times daily.) 60 tablet 0  . medroxyPROGESTERone (PROVERA) 2.5 MG tablet TAKE ONE TABLET BY MOUTH ONCE DAILY 90 tablet 0  . Multiple Vitamins-Minerals (MULTIVITAMIN ADULT PO) Take 1 tablet by mouth daily.    Marland Kitchen omega-3 acid ethyl esters (LOVAZA) 1 g capsule TAKE TWO CAPSULES BY MOUTH TWICE DAILY 360 capsule 0  . ondansetron (ZOFRAN)  8 MG tablet Take 1 tablet (8 mg total) by mouth every 8 (eight) hours as needed for nausea. 24 tablet 1  . OVER THE COUNTER MEDICATION Take 1 tablet by mouth daily. Focus Factor: Brain Function Support    . OZEMPIC, 1 MG/DOSE, 2 MG/1.5ML SOPN Inject 1 mg into the skin once a week.    . promethazine (PHENERGAN) 12.5 MG tablet TAKE ONE TABLET BY MOUTH EVERY 8 HOURS AS NEEDED. NEED CHECK UP/LABS FOR ADDITIONAL REFILLS 30 tablet 0  . Propylene Glycol-Glycerin (SOOTHE OP) Apply 1 drop to eye daily.    Marland Kitchen  sitaGLIPtin (JANUVIA) 50 MG tablet Take 1 tablet (50 mg total) by mouth daily. 90 tablet 3  . thiamine (VITAMIN B-1) 100 MG tablet Take 100 mg by mouth daily.    Marland Kitchen triamcinolone (NASACORT) 55 MCG/ACT nasal inhaler Place 2 sprays into the nose daily. (Patient taking differently: Place 2 sprays into the nose daily as needed (allergies).) 1 Inhaler 5  . vitamin C (ASCORBIC ACID) 500 MG tablet Take 500 mg by mouth daily.    . Vitamin D, Ergocalciferol, (DRISDOL) 50000 units CAPS capsule Take 50,000 Units by mouth every 7 (seven) days.    . vitamin E 400 UNIT capsule Take 400 Units by mouth daily.     No current facility-administered medications for this visit.    Allergies as of 07/14/2020 - Review Complete 06/30/2020  Allergen Reaction Noted  . Lyrica [pregabalin]  01/22/2020  . Amlodipine besylate    . Atorvastatin    . Ezetimibe-simvastatin    . Losartan potassium-hctz  02/28/2011  . Morphine and related Other (See Comments) 08/11/2015  . Niacin  10/12/2006  . Rosuvastatin    . Toprol xl [metoprolol succinate] Other (See Comments) 02/28/2011  . Valium [diazepam]  04/12/2015  . Latex Rash 06/06/2011    Family History  Problem Relation Age of Onset  . Hypertension Mother   . Depression Mother   . Stomach cancer Mother 67  . Diabetes Maternal Grandmother   . Hypertension Maternal Grandmother   . Breast cancer Maternal Grandmother   . Heart attack Maternal Grandmother   . Hypertension  Brother   . Stroke Other   . Diabetes Maternal Aunt   . Breast cancer Maternal Aunt   . Diabetes Maternal Uncle   . Hypertension Maternal Uncle   . Hypertension Maternal Uncle     Social History   Socioeconomic History  . Marital status: Single    Spouse name: Not on file  . Number of children: Not on file  . Years of education: Not on file  . Highest education level: Not on file  Occupational History  . Occupation: Retired  Tobacco Use  . Smoking status: Never Smoker  . Smokeless tobacco: Never Used  Substance and Sexual Activity  . Alcohol use: No  . Drug use: No  . Sexual activity: Yes    Birth control/protection: Post-menopausal  Other Topics Concern  . Not on file  Social History Narrative  . Not on file   Social Determinants of Health   Financial Resource Strain: Not on file  Food Insecurity: Not on file  Transportation Needs: Not on file  Physical Activity: Not on file  Stress: Not on file  Social Connections: Not on file  Intimate Partner Violence: Not on file    Subjective:*** Review of Systems  Constitutional: Negative for chills, fever, malaise/fatigue and weight loss.  HENT: Negative for congestion and sore throat.   Respiratory: Negative for cough and shortness of breath.   Cardiovascular: Negative for chest pain and palpitations.  Gastrointestinal: Negative for abdominal pain, blood in stool, diarrhea, melena, nausea and vomiting.  Musculoskeletal: Negative for joint pain and myalgias.  Skin: Negative for rash.  Neurological: Negative for dizziness and weakness.  Endo/Heme/Allergies: Does not bruise/bleed easily.  Psychiatric/Behavioral: Negative for depression. The patient is not nervous/anxious.   All other systems reviewed and are negative.      Objective: LMP 03/15/2014  Physical Exam Vitals and nursing note reviewed.  Constitutional:      General: She is not in acute distress.  Appearance: Normal appearance. She is well-developed.  She is not ill-appearing, toxic-appearing or diaphoretic.  HENT:     Head: Normocephalic and atraumatic.     Nose: No congestion or rhinorrhea.  Eyes:     General: No scleral icterus. Cardiovascular:     Rate and Rhythm: Normal rate and regular rhythm.     Heart sounds: Normal heart sounds.  Pulmonary:     Effort: Pulmonary effort is normal. No respiratory distress.     Breath sounds: Normal breath sounds.  Abdominal:     General: Bowel sounds are normal.     Palpations: Abdomen is soft. There is no hepatomegaly, splenomegaly or mass.     Tenderness: There is no abdominal tenderness. There is no guarding or rebound.     Hernia: No hernia is present.  Skin:    General: Skin is warm and dry.     Coloration: Skin is not jaundiced.     Findings: No rash.  Neurological:     General: No focal deficit present.     Mental Status: She is alert and oriented to person, place, and time.  Psychiatric:        Attention and Perception: Attention normal.        Mood and Affect: Mood normal.        Speech: Speech normal.        Behavior: Behavior normal.        Thought Content: Thought content normal.        Cognition and Memory: Cognition and memory normal.      Assessment:  ***   Plan: ***    Thank you for allowing [place] to participate in the care of Salvatrice C Jeziorski  Walden Field, DNP, AGNP-C Adult & Gerontological Nurse Practitioner [facility]   07/14/2020 7:25 AM   Disclaimer: This note was dictated with voice recognition software. Similar sounding words can inadvertently be transcribed and may not be corrected upon review.

## 2020-08-10 ENCOUNTER — Encounter: Payer: Self-pay | Admitting: Cardiology

## 2020-08-10 NOTE — Progress Notes (Signed)
Cardiology Office Note  Date: 08/11/2020   ID: ADABELLE GRIFFITHS, DOB 13-Sep-1950, MRN [id number]  PCP:  Percell Belt, DO  Cardiologist:  Rozann Lesches, MD Electrophysiologist:  None   Chief Complaint  Patient presents with  . Cardiac follow-up    History of Present Illness: Dana Romero is a 70 y.o. female last seen in December 2020.  She is here today with her husband.  Since I last met her, she has changed to a new PCP in [place].  She tells her that she has been having a lot of trouble with anorexia and weight loss that is unintentional, also recurring episodes of nausea and emesis.  Blood pressure is significantly elevated today.  She states that she had an episode of emesis this morning and has not yet taken her Coreg.  She also states that she was not able to stay on Cozaar 100 mg daily as it made her feel very weak in the mornings.  It does not sound like her home blood pressure checks are optimal either, she reports diastolics around 425.  She has a number of medication intolerances as listed below.  I talked with her today about being consistent with Coreg use, tracking blood pressure and considering resumption of losartan at 50 mg daily.  Also follow-up with PCP.  LDL was 150 in January.  She has a history of multistatin intolerance.  I talked with her today about PCSK9 inhibitors, we will check on her insurance coverage.  January 2021 demonstrated sinus rhythm with a few brief episodes of SVT but no atrial fibrillation.  This was checked given her history of stroke to exclude atrial arrhythmias.  She states that she has since come off of Plavix.  Past Medical History:  Diagnosis Date  . Anxiety   . Asthma   . Caregiver stress 07/04/2011  . Chronic pain syndrome   . Degenerative disc disease, cervical   . Depression   . Essential hypertension 1998  . GERD (gastroesophageal reflux disease)   . Gout   . History of pancreatitis 06/08/2005  . History of stroke     September 2020  . Pelvic mass   . Seasonal allergies   . Sinusitis, chronic   . Transfusion history   . Type 2 diabetes mellitus ([place]) 1998    Past Surgical History:  Procedure Laterality Date  . APPENDECTOMY    . ARM DEBRIDEMENT Left    debridement of axilla "spider bite"  . BREAST SURGERY     breast cyst x2 -last '12  . CHOLECYSTECTOMY N/A 08/11/2015   Procedure: LAPAROSCOPIC CHOLECYSTECTOMY;  Surgeon: Arta Bruce Kinsinger, MD;  Location: [facility];  Service: General;  Laterality: N/A;  . TONSILLECTOMY    . TONSILLECTOMY AND ADENOIDECTOMY    . TUBAL LIGATION     post partum    Current Outpatient Medications  Medication Sig Dispense Refill  . ADVAIR HFA 115-21 MCG/ACT inhaler INHALE 2 PUFFS INTO LUNGS TWICE DAILY 12 Inhaler 0  . albuterol (PROVENTIL) (2.5 MG/3ML) 0.083% nebulizer solution USE ONE VIAL IN NEBULIZER EVERY 6 HOURS AS NEEDED FOR WHEEZING OR SHORTNESS OF BREATH 75 mL 0  . albuterol (VENTOLIN HFA) 108 (90 Base) MCG/ACT inhaler INHALE ONE TO TWO PUFFS BY MOUTH EVERY 6 HOURS AS NEEDED FOR WHEEZING OR SHORTNESS OF BREATH (OR PERSISTENT COUGHING) 18 each 2  . Alcohol Swabs PADS Use to check blood sugar daily. E11.9 100 each 3  . ALPRAZolam (XANAX) 1 MG tablet Take 1  mg by mouth 2 (two) times daily.    Marland Kitchen amitriptyline (ELAVIL) 25 MG tablet Take 1 tablet (25 mg total) by mouth at bedtime. 30 tablet 0  . blood glucose meter kit and supplies KIT Dispense based on patient and insurance preference. Use once a day.  DX: E11.9. 1 each 0  . blood glucose meter kit and supplies Dispense based on patient and insurance preference. Use up to four times daily as directed. (FOR ICD-10 E11.9). 1 each 0  . Blood Glucose Monitoring Suppl (FREESTYLE LITE) DEVI     . carvedilol (COREG) 25 MG tablet Take 1 tablet (25 mg total) by mouth 2 (two) times daily with a meal. 180 tablet 3  . cetirizine (ZYRTEC) 10 MG tablet Take 10 mg by mouth daily.    . Cinnamon 500 MG capsule Take 1,000 mg by mouth  daily.    . colchicine 0.6 MG tablet Take 0.6 mg by mouth daily as needed (gout flare up).    . diphenhydrAMINE (BENADRYL) 25 mg capsule Take 25 mg by mouth at bedtime.    Marland Kitchen EPINEPHrine (EPIPEN 2-PAK) 0.3 mg/0.3 mL IJ SOAJ injection Inject 0.3 mLs (0.3 mg total) into the muscle once. 1 Device 1  . esomeprazole (NEXIUM) 40 MG capsule TAKE ONE CAPSULE BY MOUTH EVERY DAY BEFORE BREAKFAST 90 capsule 0  . estradiol (VIVELLE-DOT) 0.075 MG/24HR Place 1 patch onto the skin 2 (two) times a week.    . ezetimibe (ZETIA) 10 MG tablet Take 10 mg by mouth daily.    Marland Kitchen FLUoxetine (PROZAC) 20 MG tablet Take 4 tablets (80 mg total) by mouth daily. 360 tablet 0  . glucose blood test strip Test blood sugar daily.  DX: E11.9 100 each 2  . glucose monitoring kit (FREESTYLE) monitoring kit Test blood sugar daily. DX: E11.9 1 each 0  . HYDROcodone-acetaminophen (NORCO) 10-325 MG per tablet Take 1 tablet by mouth every 6 (six) hours as needed for moderate pain or severe pain.    Marland Kitchen ketotifen (ZADITOR) 0.025 % ophthalmic solution Place 1 drop into both eyes 2 (two) times daily as needed (Allergies).    Elmore Guise Devices (LANCING DEVICE) MISC Use to test blood sugar daily. E11.9 1 each 0  . Lancets MISC Test blood sugar daily. DX: E11.9 100 each 2  . magnesium oxide (MAG-OX) 400 MG tablet Take 1 tablet (400 mg total) by mouth 2 (two) times daily. (Patient taking differently: Take 400 mg by mouth 3 (three) times daily.) 60 tablet 0  . meclizine (ANTIVERT) 12.5 MG tablet Take 12.5 mg by mouth 2 (two) times daily.    . medroxyPROGESTERone (PROVERA) 2.5 MG tablet TAKE ONE TABLET BY MOUTH ONCE DAILY 90 tablet 0  . Multiple Vitamins-Minerals (MULTIVITAMIN ADULT PO) Take 1 tablet by mouth daily.    Marland Kitchen omega-3 acid ethyl esters (LOVAZA) 1 g capsule TAKE TWO CAPSULES BY MOUTH TWICE DAILY 360 capsule 0  . ondansetron (ZOFRAN) 8 MG tablet Take 1 tablet (8 mg total) by mouth every 8 (eight) hours as needed for nausea. 24 tablet 1  . OVER  THE COUNTER MEDICATION Take 1 tablet by mouth daily. Focus Factor: Brain Function Support    . OZEMPIC, 1 MG/DOSE, 2 MG/1.5ML SOPN Inject 1 mg into the skin once a week.    . promethazine (PHENERGAN) 12.5 MG tablet TAKE ONE TABLET BY MOUTH EVERY 8 HOURS AS NEEDED. NEED CHECK UP/LABS FOR ADDITIONAL REFILLS 30 tablet 0  . Propylene Glycol-Glycerin (SOOTHE OP) Apply 1 drop  to eye daily.    . sitaGLIPtin (JANUVIA) 50 MG tablet Take 1 tablet (50 mg total) by mouth daily. 90 tablet 3  . thiamine (VITAMIN B-1) 100 MG tablet Take 100 mg by mouth daily.    Marland Kitchen triamcinolone (NASACORT) 55 MCG/ACT nasal inhaler Place 2 sprays into the nose daily. (Patient taking differently: Place 2 sprays into the nose daily as needed (allergies).) 1 Inhaler 5  . vitamin C (ASCORBIC ACID) 500 MG tablet Take 500 mg by mouth daily.    . Vitamin D, Ergocalciferol, (DRISDOL) 50000 units CAPS capsule Take 50,000 Units by mouth every 7 (seven) days.    . vitamin E 400 UNIT capsule Take 400 Units by mouth daily.     No current facility-administered medications for this visit.   Allergies:  Lyrica [pregabalin], Amlodipine besylate, Atorvastatin, Ezetimibe-simvastatin, Losartan potassium-hctz, Morphine and related, Niacin, Rosuvastatin, Toprol xl [metoprolol succinate], Valium [diazepam], and Latex   ROS: No syncope.  Physical Exam: VS:  BP (!) 170/110   Pulse 88   Ht _0  (1.626 m)   Wt 139 lb 9.6 oz (63.3 kg)   LMP 03/15/2014   SpO2 97%   BMI 23.96 kg/m , BMI Body mass index is 23.96 kg/m.  Wt Readings from Last 3 Encounters:  08/11/20 139 lb 9.6 oz (63.3 kg)  06/30/20 140 lb (63.5 kg)  05/18/20 137 lb (62.1 kg)    General: Patient appears comfortable at rest. HEENT: Conjunctiva and lids normal, wearing a mask. Neck: Supple, no elevated JVP or carotid bruits, no thyromegaly. Lungs: Clear to auscultation, nonlabored breathing at rest. Cardiac: Regular rate and rhythm, no S3 or significant systolic murmur, no  pericardial rub. Extremities: No pitting edema, distal pulses 2+.  ECG:  An ECG dated 03/15/2020 was personally reviewed today and demonstrated:  Sinus rhythm with poor R wave progression and nonspecific T wave changes.  Recent Labwork: 06/30/2020: ALT 43; AST 51; BUN 20; Creatinine, Ser 1.27; Hemoglobin 12.4; Platelets 310; Potassium 3.7; Sodium 135     Component Value Date/Time   CHOL 224 (H) 05/26/2020 1117   TRIG 103 05/26/2020 1117   HDL 53 05/26/2020 1117   CHOLHDL 4.2 05/26/2020 1117   VLDL 21 05/26/2020 1117   LDLCALC 150 (H) 05/26/2020 1117   LDLDIRECT 133 (H) 05/09/2012 1414    Other Studies Reviewed Today:  Cardiac monitor January 2021: Zio patch reviewed.  12 days 5 hours analyzed.  Predominant rhythm is sinus with heart rate ranging from 63 bpm up to 114 bpm and average heart rate 81 bpm.  Rare PACs and PVCs noted representing less than 1% of total beats.  There were a few, generally brief episodes of SVT, one aberrantly conducted episode of 4 beats, longest SVT episode lasted 13 seconds.  Heart rate up into the 190s during brief SVT.  A few isolated complexes look to be possible preexcitation.  There were no sustained arrhythmias or pauses.  Assessment and Plan:  1.  Essential hypertension, blood pressure not well controlled.  I recommended that she be consistent with Coreg 25 mg twice daily, continue to track blood pressure, and consider resuming Cozaar at 50 mg daily.  If she has recurrent intolerance to Cozaar, different medication choice could be considered such as hydralazine given her allergies listed above.  Also encouraged follow-up with PCP.  2.  Mixed hyperlipidemia with multistatin intolerance including Crestor, Lipitor, and Zocor.  I doubt that Zetia would bring her LDL down adequately to goal which should be under  70.  Her most recent LDL was 150.  We will check into PCSK9 inhibitor coverage.  3.  Prior history of right thalamic stroke, no documented atrial  arrhythmias by cardiac monitoring.  She states that she is no longer on Plavix.  Would focus on better blood pressure control, lipid management, and glucose control.  Medication Adjustments/Labs and Tests Ordered: Current medicines are reviewed at length with the patient today.  Concerns regarding medicines are outlined above.   Tests Ordered: No orders of the defined types were placed in this encounter.   Medication Changes: No orders of the defined types were placed in this encounter.   Disposition:  Follow up 6 months in the [place] office.  Signed, Satira Sark, MD, [facility] 08/11/2020 12:21 PM    [facility] [address], [place],  [address] Phone: [phone]; Fax: [phone]

## 2020-08-11 ENCOUNTER — Telehealth: Payer: Self-pay | Admitting: *Deleted

## 2020-08-11 ENCOUNTER — Ambulatory Visit: Payer: Medicare HMO | Admitting: Cardiology

## 2020-08-11 ENCOUNTER — Encounter: Payer: Self-pay | Admitting: Cardiology

## 2020-08-11 VITALS — BP 170/110 | HR 88 | Ht 64.0 in | Wt 139.6 lb

## 2020-08-11 DIAGNOSIS — Z789 Other specified health status: Secondary | ICD-10-CM | POA: Diagnosis not present

## 2020-08-11 DIAGNOSIS — E782 Mixed hyperlipidemia: Secondary | ICD-10-CM

## 2020-08-11 DIAGNOSIS — Z8673 Personal history of transient ischemic attack (TIA), and cerebral infarction without residual deficits: Secondary | ICD-10-CM | POA: Diagnosis not present

## 2020-08-11 DIAGNOSIS — I10 Essential (primary) hypertension: Secondary | ICD-10-CM | POA: Diagnosis not present

## 2020-08-11 NOTE — Telephone Encounter (Signed)
Yes, please start Repatha if she is willing to proceed.  We would need to check FLP for follow-up visit.

## 2020-08-11 NOTE — Telephone Encounter (Signed)
Prior Auth completed for Repatha 140 mg subcutaneously every 2 weeks approved from 05/08/2020 - 05/07/2021. Repatha rx called to [facility] for 140 mg sub Q every 2 weeks #2. [facility] contacted to get copay amount and is $9.85 per month.

## 2020-08-11 NOTE — Patient Instructions (Addendum)
Medication Instructions:   Your physician recommends that you continue on your current medications as directed. Please refer to the Current Medication list given to you today.  Please take your carvedilol when you get home and take it on a regular basis  If your blood pressures remain high, restart losartan at 1/2 the dose daily 50 mg  We are checking on the price of repatha and praluent.  Labwork:  none  Testing/Procedures:  none  Follow-Up:  Your physician recommends that you schedule a follow-up appointment in: 6 months.  Any Other Special Instructions Will Be Listed Below (If Applicable). Your physician has requested that you regularly monitor and record your blood pressure readings at home. Please use the same machine at the same time of day to check your readings and record them to bring to your follow-up visit.  Please follow up with your family doctor regarding your blood pressure  If you need a refill on your cardiac medications before your next appointment, please call your pharmacy.

## 2020-08-12 ENCOUNTER — Other Ambulatory Visit: Payer: Self-pay | Admitting: *Deleted

## 2020-08-12 DIAGNOSIS — E782 Mixed hyperlipidemia: Secondary | ICD-10-CM

## 2020-08-12 MED ORDER — REPATHA SURECLICK 140 MG/ML ~~LOC~~ SOAJ: 140.0000 mg | SUBCUTANEOUS | 3 refills | Status: DC

## 2020-08-12 NOTE — Telephone Encounter (Signed)
Patient informed, agrees and verbalized understanding of plan. Lab order mailed to patient

## 2020-08-16 ENCOUNTER — Encounter: Payer: Medicare HMO | Admitting: Nurse Practitioner

## 2020-08-25 ENCOUNTER — Encounter: Payer: Self-pay | Admitting: Internal Medicine

## 2020-09-08 ENCOUNTER — Other Ambulatory Visit: Payer: Self-pay | Admitting: Cardiology

## 2020-11-15 ENCOUNTER — Ambulatory Visit: Payer: Medicare HMO | Admitting: Gastroenterology

## 2020-12-15 ENCOUNTER — Encounter: Payer: Self-pay | Admitting: Internal Medicine

## 2021-01-19 ENCOUNTER — Ambulatory Visit: Payer: Medicare HMO | Admitting: Gastroenterology

## 2021-04-13 ENCOUNTER — Ambulatory Visit (INDEPENDENT_AMBULATORY_CARE_PROVIDER_SITE_OTHER): Payer: Medicare HMO | Admitting: Gastroenterology

## 2021-04-13 ENCOUNTER — Encounter: Payer: Self-pay | Admitting: Gastroenterology

## 2021-04-13 ENCOUNTER — Other Ambulatory Visit: Payer: Self-pay

## 2021-04-13 VITALS — BP 128/76 | HR 86 | Temp 97.0°F | Ht 64.0 in | Wt 145.0 lb

## 2021-04-13 DIAGNOSIS — K5900 Constipation, unspecified: Secondary | ICD-10-CM

## 2021-04-13 DIAGNOSIS — R1319 Other dysphagia: Secondary | ICD-10-CM | POA: Diagnosis not present

## 2021-04-13 DIAGNOSIS — R1013 Epigastric pain: Secondary | ICD-10-CM | POA: Diagnosis not present

## 2021-04-13 DIAGNOSIS — R7989 Other specified abnormal findings of blood chemistry: Secondary | ICD-10-CM

## 2021-04-13 DIAGNOSIS — R634 Abnormal weight loss: Secondary | ICD-10-CM

## 2021-04-13 DIAGNOSIS — R112 Nausea with vomiting, unspecified: Secondary | ICD-10-CM

## 2021-04-13 DIAGNOSIS — R1310 Dysphagia, unspecified: Secondary | ICD-10-CM | POA: Insufficient documentation

## 2021-04-13 DIAGNOSIS — G8929 Other chronic pain: Secondary | ICD-10-CM | POA: Insufficient documentation

## 2021-04-13 HISTORY — DX: Nausea with vomiting, unspecified: R11.2

## 2021-04-13 NOTE — Patient Instructions (Addendum)
We will schedule you for an upper endoscopy once we have your current medication list. We have not received from the pharmacy yet. If we do not receive today, we will need you to call in with a current medication list.  I can also give you some other suggestions for management of your constipation once we have your medication list.

## 2021-04-13 NOTE — Progress Notes (Addendum)
Primary Care Physician:  Percell Belt, DO  Primary Gastroenterologist:  Elon Alas. Abbey Chatters, DO   Chief Complaint  Patient presents with   Gastroesophageal Reflux    C/o nausea w/ vomiting x 1.5 yrs   Abdominal Pain    Across upper abd, comes/goes   Weight Loss    60 lbs since last year    HPI:  Dana Romero is a 70 y.o. female here at the request of Dr. Rayann Heman for further evaluation of GERD, N/V. She was also referred 05/2020 by her former PCP for elevated LFTs. Due to multiple missed or changed appointments (once due to provider schedule change) she has not been evaluated until today.  Patient reports persistent nausea with significant vomiting associated with substantial weight loss since 11/2019. She reports symptoms were severe in the beginning and she lost about 60 pounds in the first 6 months. Dropped from 200 pounds to 128 pounds at the lowest point. Her weight has been fairly stable since the past few months. In fact she has been able to gain back about 15 pounds.   She reports at time of onset of symptoms, she had recently lost her twin brother to pancreatic cancer.  3 years prior she had lost her mother to stomach cancer.  She also reports that her A1c was over 10 as well.  She was having terrible heartburn having been off the medication for some time.  States her provider at the time had stopped her medication but now that she is back on Nexium typical reflux symptoms are much better.  She has been having frequent upper abdominal pain, is unaware if it is brought on by any particular activities with food.  When she gets nauseated sometimes it helps if she lays down flat.  Over the past couple of months her abdominal pain has somewhat improved but is persistent, vomiting is less frequent.  She last vomited 2 weeks ago.  When she has vomiting she describes violent heaves, emesis consisting of green liquid.  No hematemesis.  Nausea is frequent but has improved with lower dose of Ozempic.   She had noticed that foods she typically tolerated in the past are now bothering her.  Complains of solid food dysphagia over the last several months.  She has chronic constipation, typically associated with bloating.  In the past could go up to 3 weeks without a bowel movement.  For the past several weeks she has been taking Senokot 2 at bedtime and currently having a bowel movement every 3 to 4 days, typically [place] 5.  No melena or rectal bleeding.  She reports no prior colonoscopy or endoscopy.  She states her nephrologist has advised against doing bowel prep for colonoscopy.  She has plans for Cologuard but has not received the kit yet.  Patient states she has also been experiencing vertigo.  Symptoms were persistent for 6 months earlier this year.  At times she feels off balance and is fell twice in the past 2 weeks.  States she has a walker she is supposed to use but does not always.  She had a stroke 2 years ago.  Reports being on chronic pain medication for chronic neck/shoulder pain.  She follows with cardiology, last seen in April.  Seen for hypertension, will hyperlipidemia with multi-statin intolerance.  Right thalamic stroke with no documented atrial arrhythmias by cardiac monitoring.    She had abnormal MRI brain March 2022, was referred to neurology but has not been seen, missed her  appointment.  Patient states that she is supposed to see them in January but there is no evidence of an appointment pending.  She has prior history of pancreatitis in 2007 but is unsure of the etiology.  Gallbladder removed in 2017.  Labs from September 2022: Sodium 138, potassium 4.4, BUN 20, glucose 98, total bilirubin 0.4, alkaline phosphatase 67, AST 43, ALT 37, A1c 5.8.  In September 2021 her AST was 62, ALT 52.  In January 2022 her AST was 91, ALT 68.  MRI brain with and without contrast March 2022 1.  No acute intracranial abnormality.  Specifically, no evidence of posterior fossa infarcts.  2.   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 In addition, there is an area of abnormal signal within the pons which may represent similar chronic ischemic microvascular disease, however early changes from damage to the transverse pontocerebellar tracts in this configuration can be seen in neurodegenerative diseases, classically in the cerebellar type multiple system atrophy (MSA-C). Note that no priors are available to compare for interval change.  3.  There is an apparent discontinuity at the base of the dens only seen on a single image (series 5, image 20) which is indeterminant. This may represent a prior type II dens fracture or some erosive changes and sclerosis with surrounding pannus formation given the soft tissue around the dens. This does not result in significant stenosis of the upper spinal canal. If available, comparison with prior CT or CTA could be used for confirmation. If the patient has symptoms, this could be further evaluated with a noncontrast CT of the cervical spine.  Exam End: 07/27/20 15:19   CT abdomen pelvis March 2022: Moderate sized hiatal hernia, soft tissue fullness in the vaginal region in this patient with reported mass.  Liver normal, status postcholecystectomy with no biliary dilation.  Pancreas normal.  Medication list updated 04/21/21  Current Outpatient Medications  Medication Sig Dispense Refill   ADVAIR HFA 115-21 MCG/ACT inhaler INHALE 2 PUFFS INTO LUNGS TWICE DAILY 12 Inhaler 0   albuterol (PROVENTIL) (2.5 MG/3ML) 0.083% nebulizer solution USE ONE VIAL IN NEBULIZER EVERY 6 HOURS AS NEEDED FOR WHEEZING OR SHORTNESS OF BREATH 75 mL 0   albuterol (VENTOLIN HFA) 108 (90 Base) MCG/ACT inhaler INHALE ONE TO TWO PUFFS BY MOUTH EVERY 6 HOURS AS NEEDED FOR WHEEZING OR SHORTNESS OF BREATH (OR PERSISTENT COUGHING) 18 each 2   ALPRAZolam (XANAX) 1 MG tablet Take 1 tablet by mouth 2 (two) times daily.     amitriptyline  (ELAVIL) 25 MG tablet Take 1 tablet (25 mg total) by mouth at bedtime. 30 tablet 0   carvedilol (COREG) 25 MG tablet Take 1 tablet (25 mg total) by mouth 2 (two) times daily with a meal. (Patient taking differently: Take 25 mg by mouth daily.) 180 tablet 3   EPINEPHrine (EPIPEN 2-PAK) 0.3 mg/0.3 mL IJ SOAJ injection Inject 0.3 mLs (0.3 mg total) into the muscle once. 1 Device 1   esomeprazole (NEXIUM) 40 MG capsule TAKE ONE CAPSULE BY MOUTH EVERY DAY BEFORE BREAKFAST 90 capsule 0   FLUoxetine (PROZAC) 20 MG tablet Take 4 tablets (80 mg total) by mouth daily. 923 tablet 0   Garlic (GARLIQUE) 300 MG TBEC Take by mouth in the morning, at noon, and at bedtime.     HYDROcodone-acetaminophen (NORCO) 10-325 MG per tablet Take 1 tablet by mouth every 6 (six) hours as needed for moderate pain or severe pain.     meclizine (  ANTIVERT) 12.5 MG tablet Take 12.5 mg by mouth 2 (two) times daily.     medroxyPROGESTERone (PROVERA) 2.5 MG tablet TAKE ONE TABLET BY MOUTH ONCE DAILY 90 tablet 0   ondansetron (ZOFRAN) 8 MG tablet Take 1 tablet (8 mg total) by mouth every 8 (eight) hours as needed for nausea. 24 tablet 1   promethazine (PHENERGAN) 12.5 MG tablet TAKE ONE TABLET BY MOUTH EVERY 8 HOURS AS NEEDED. NEED CHECK UP/LABS FOR ADDITIONAL REFILLS 30 tablet 0   Alcohol Swabs PADS Use to check blood sugar daily. E11.9 100 each 3   blood glucose meter kit and supplies KIT Dispense based on patient and insurance preference. Use once a day.  DX: E11.9. 1 each 0   blood glucose meter kit and supplies Dispense based on patient and insurance preference. Use up to four times daily as directed. (FOR ICD-10 E11.9). 1 each 0   Blood Glucose Monitoring Suppl (FREESTYLE LITE) DEVI      glucose blood test strip Test blood sugar daily.  DX: E11.9 100 each 2   glucose monitoring kit (FREESTYLE) monitoring kit Test blood sugar daily. DX: E11.9 1 each 0   Lancet Devices (LANCING DEVICE) MISC Use to test blood sugar daily. E11.9 1  each 0   Lancets MISC Test blood sugar daily. DX: E11.9 100 each 2   No current facility-administered medications for this visit.    Allergies as of 04/13/2021 - Review Complete 04/13/2021  Allergen Reaction Noted   Lyrica [pregabalin]  01/22/2020   Amlodipine besylate     Atorvastatin     Ezetimibe-simvastatin     Losartan potassium-hctz  02/28/2011   Morphine and related Other (See Comments) 08/11/2015   Niacin  10/12/2006   Rosuvastatin     Toprol xl [metoprolol succinate] Other (See Comments) 02/28/2011   Valium [diazepam]  04/12/2015   Latex Rash 06/06/2011    Past Medical History:  Diagnosis Date   Anxiety    Asthma    Caregiver stress 07/04/2011   Chronic pain syndrome    Degenerative disc disease, cervical    Depression    Essential hypertension 1998   GERD (gastroesophageal reflux disease)    Gout    History of pancreatitis 06/08/2005   History of stroke    September 2020   Pelvic mass    Seasonal allergies    Sinusitis, chronic    Transfusion history    Type 2 diabetes mellitus ([place]) 1998    Past Surgical History:  Procedure Laterality Date   APPENDECTOMY     ARM DEBRIDEMENT Left    debridement of axilla "spider bite"   BREAST SURGERY     breast cyst x2 -last '12   CHOLECYSTECTOMY N/A 08/11/2015   Procedure: LAPAROSCOPIC CHOLECYSTECTOMY;  Surgeon: Arta Bruce Kinsinger, MD;  Location: [facility];  Service: General;  Laterality: N/A;   TONSILLECTOMY     TONSILLECTOMY AND ADENOIDECTOMY     TUBAL LIGATION     post partum    Family History  Problem Relation Age of Onset   Hypertension Mother    Depression Mother    Stomach cancer Mother 69   Hypertension Brother    Pancreatic cancer Brother    Diabetes Maternal Grandmother    Hypertension Maternal Grandmother    Breast cancer Maternal Grandmother    Heart attack Maternal Grandmother    Diabetes Maternal Aunt    Breast cancer Maternal Aunt    Diabetes Maternal Uncle    Hypertension Maternal Uncle  Hypertension Maternal Uncle    Stroke Other    Colon cancer Neg Hx     Social History   Socioeconomic History   Marital status: Single    Spouse name: Not on file   Number of children: Not on file   Years of education: Not on file   Highest education level: Not on file  Occupational History   Occupation: Retired  Tobacco Use   Smoking status: Never   Smokeless tobacco: Never  Substance and Sexual Activity   Alcohol use: No   Drug use: No   Sexual activity: Yes    Birth control/protection: Post-menopausal  Other Topics Concern   Not on file  Social History Narrative   Not on file   Social Determinants of Health   Financial Resource Strain: Not on file  Food Insecurity: Not on file  Transportation Needs: Not on file  Physical Activity: Not on file  Stress: Not on file  Social Connections: Not on file  Intimate Partner Violence: Not on file      ROS:  General: Negative for  fever, chills.  Positive fatigue,  weakness, poor appetite, nausea, previous weight loss. Eyes: Negative for vision changes.  ENT: Negative for hoarseness,  nasal congestion.  See HPI CV: Negative for chest pain, angina, palpitations, dyspnea on exertion, peripheral edema.  Respiratory: Negative for dyspnea at rest, dyspnea on exertion, cough, sputum, wheezing.  GI: See history of present illness. GU:  Negative for dysuria, hematuria, urinary incontinence, urinary frequency, nocturnal urination.  MS: Negative for joint pain, low back pain.  Derm: Negative for rash or itching.  Neuro: Negative for weakness, abnormal sensation, seizure, frequent headaches, memory loss, confusion.  Psych: Negative for  depression, suicidal ideation, hallucinations.  Positive anxiety Endo: Negative for unusual weight change.  Heme: Negative for bruising or bleeding. Allergy: Negative for rash or hives.    Physical Examination:  BP 128/76   Pulse 86   Temp (!) 97 F (36.1 C)   Ht _0  (1.626 m)   Wt 145  lb (65.8 kg)   LMP 03/15/2014   BMI 24.89 kg/m    General: Well-nourished, well-developed in no acute distress.  Head: Normocephalic, atraumatic.   Eyes: Conjunctiva pink, no icterus. Mouth: masked Neck: Supple without thyromegaly, masses, or lymphadenopathy.  Lungs: Clear to auscultation bilaterally.  Heart: Regular rate and rhythm, no murmurs rubs or gallops.  Abdomen: Bowel sounds are normal,   nondistended, no hepatosplenomegaly or masses, no abdominal bruits or    hernia , no rebound or guarding.  Mild left upper quadrant tenderness Rectal: not performed Extremities: No lower extremity edema. No clubbing or deformities.  Neuro: Alert and oriented x 4 , grossly normal neurologically.  Skin: Warm and dry, no rash or jaundice.   Psych: Alert and cooperative, normal mood and affect.  Labs:  See above   Imaging Studies: No results found.   Assessment:  Upper abdominal pain associated with nausea/vomiting/weight loss: Symptoms initially started in July 2021, reporting her A1c was over 10 and she recently had lost her twin brother to pancreatic cancer.  Over the course of 6 months she lost 60 pounds.  Fortunately has gained 15 pounds back and her weight has been stable the last 3 months.  During this time she notes that her heartburn was severe, but improved back on Nexium.  Nausea also improved with decreasing her dose for Ozempic.  Her work-up includes CT abdomen and pelvis with contrast which showed moderate hiatal hernia.  There was concern for soft tissue mass involving the vagina, extending to the vaginal introitus but this was not apparent on MRI pelvis.  She saw her gynecologist \who found no abnormality on exam.  Subsequently had follow-up CT abdomen and pelvis March 2022, soft tissue fullness of the vagina and surrounding structures poorly characterized by CT.  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  There was also an area of abnormal signal within the pons and apparent discontinuity at the base of the dens which was indeterminate. She never saw neurology.   Etiology of her symptoms or not clear but suspect multifactorial.  In part could be due to moderate sized hiatal hernia, complicated GERD (now with esophageal dysphagia to solid foods).  Cannot exclude peptic ulcer disease or malignancy.  I suspect element of functional overlay in the setting of anxiety and depression, symptoms were worse around the time of her brother's death.  Cannot exclude a neurological component contributing to her nausea as well.  Constipation: Continues to have infrequent stools but for the most part stools are soft using Senokot regularly.  It is unclear whether she is taking Senokot as with sennosides.  We are still waiting for list of her medications.  No prior colonoscopy.  She is waiting for Cologuard test kit.  Declines colonoscopy due to renal insufficiency.  Abnormal LFTs: Typically AST greater than ALT, recently AST only minimally elevated at 43.  Liver unremarkable on prior CT.   Plan:  Discussed with patient at length today, initially we will offer her an upper endoscopy with possible esophageal dilation with Dr. Abbey Chatters. ASA III.  We will hold off on scheduling until we receive updated medication list, it has been requested from pharmacy but not received at time of this dictation.  I have discussed the risks, alternatives, benefits with regards to but not limited to the risk of reaction to medication, bleeding, infection, perforation and the patient is agreeable to proceed. Written consent to be obtained. Patient will continue Nexium daily. Encouraged her to follow through with Cologuard test as ordered by PCP. Encouraged her to follow-up with neurology and gynecology as previously recommended by her PCP. Can provide additional advice regarding constipation once medication list updated. Continue  to follow LFTs, if remain elevated, would recommend further evaluation.

## 2021-04-15 ENCOUNTER — Telehealth: Payer: Self-pay | Admitting: Gastroenterology

## 2021-04-15 NOTE — Telephone Encounter (Signed)
Patient was seen in office on Wednesday. Pharmacy was called for up to date medication list. I have not received list and cannot move forward with recommendations and scheduling EGD until medication list is obtained.  Can we call patient and have her tell us her medications, strength, and frequency of dose?

## 2021-04-15 NOTE — Telephone Encounter (Signed)
Lmom for pt to return my call.  

## 2021-04-18 NOTE — Telephone Encounter (Signed)
Lmom for pt to return my call.  

## 2021-04-19 NOTE — Addendum Note (Signed)
Addended by: Zada Finders on: 04/19/2021 11:33 AM   Modules accepted: Orders

## 2021-04-19 NOTE — Telephone Encounter (Signed)
Spoke with pt and updated pt's med list over the phone.

## 2021-04-21 NOTE — Addendum Note (Signed)
Addended by: Tiffany Kocher on: 04/21/2021 01:56 PM   Modules accepted: Orders

## 2021-04-21 NOTE — Telephone Encounter (Signed)
Lmom for pt to return my call.  

## 2021-04-21 NOTE — Telephone Encounter (Signed)
Reviewed updated medication list.     Please schedule patient for EGD+/-ED with Marletta Lor. ASA 3. We discussed at time of OV. Dx: epigastric pain, vomiting, weight loss, esophageal dysphagia.  She will need to hold Ozempic for one week if still on it. (She told us she was not on it but it is on her PCP OV note today). Please let pt know she can take Miralax 17grams (one capful) every day for constipation.

## 2021-04-22 NOTE — Telephone Encounter (Signed)
Pt was made aware and verbalized understanding.  

## 2021-04-22 NOTE — Telephone Encounter (Signed)
Called pt. She has been scheduled for 1/13 at 9:45am. Aware will mail instructions with pre-op appt. Confirmed same insurance for next year.

## 2021-04-22 NOTE — Telephone Encounter (Signed)
Lmovm to call back 

## 2021-04-25 ENCOUNTER — Encounter: Payer: Self-pay | Admitting: *Deleted

## 2021-05-13 NOTE — Patient Instructions (Signed)
Dana Romero Red  05/13/2021     @PREFPERIOPPHARMACY @   Your procedure is scheduled on  05/20/2021.   Report to 05/22/2021 at  0815  A.M.   Call this number if you have problems the morning of surgery:  [phone]   Remember:  Follow the diet instructions given to you by the office.    Use your nebulizer and your inhaler before you come and bring your rescue inhaler with you.    DO NOT take any medications for diabetes the morning of your procedure.     Take these medicines the morning of surgery with A SIP OF WATER                 xanax(if needed), nexium, prozac, hydrocodone(if needed),antivert, zofran(if needed), zanaflex(if needed).     Do not wear jewelry, make-up or nail polish.  Do not wear lotions, powders, or perfumes, or deodorant.  Do not shave 48 hours prior to surgery.  Men may shave face and neck.  Do not bring valuables to the hospital.  [facility] is not responsible for any belongings or valuables.  Contacts, dentures or bridgework may not be worn into surgery.  Leave your suitcase in the car.  After surgery it may be brought to your room.  For patients admitted to the hospital, discharge time will be determined by your treatment team.  Patients discharged the day of surgery will not be allowed to drive home and must have someone with them for 24 hours    Special instructions:   DO NOT smoke tobacco or vape for 24 hours before your procedure.  Please read over the following fact sheets that you were given. Anesthesia Post-op Instructions and Care and Recovery After Surgery      Upper Endoscopy, Adult, Care After This sheet gives you information about how to care for yourself after your procedure. Your health care provider may also give you more specific instructions. If you have problems or questions, contact your health care provider. What can I expect after the procedure? After the procedure, it is common to have: A sore throat. Mild  stomach pain or discomfort. Bloating. Nausea. Follow these instructions at home:  Follow instructions from your health care provider about what to eat or drink after your procedure. Return to your normal activities as told by your health care provider. Ask your health care provider what activities are safe for you. Take over-the-counter and prescription medicines only as told by your health care provider. If you were given a sedative during the procedure, it can affect you for several hours. Do not drive or operate machinery until your health care provider says that it is safe. Keep all follow-up visits as told by your health care provider. This is important. Contact a health care provider if you have: A sore throat that lasts longer than one day. Trouble swallowing. Get help right away if: You vomit blood or your vomit looks like coffee grounds. You have: A fever. Bloody, black, or tarry stools. A severe sore throat or you cannot swallow. Difficulty breathing. Severe pain in your chest or abdomen. Summary After the procedure, it is common to have a sore throat, mild stomach discomfort, bloating, and nausea. If you were given a sedative during the procedure, it can affect you for several hours. Do not drive or operate machinery until your health care provider says that it is safe. Follow instructions from your health care provider  about what to eat or drink after your procedure. Return to your normal activities as told by your health care provider. This information is not intended to replace advice given to you by your health care provider. Make sure you discuss any questions you have with your health care provider. Document Revised: 02/28/2019 Document Reviewed: 09/24/2017 Elsevier Patient Education  2022 [employer]. Esophageal Dilatation Esophageal dilatation, also called esophageal dilation, is a procedure to widen or open a blocked or narrowed part of the esophagus. The esophagus  is the part of the body that moves food and liquid from the mouth to the stomach. You may need this procedure if: You have a buildup of scar tissue in your esophagus that makes it difficult, painful, or impossible to swallow. This can be caused by gastroesophageal reflux disease (GERD). You have cancer of the esophagus. There is a problem with how food moves through your esophagus. In some cases, you may need this procedure repeated at a later time to dilate the esophagus gradually. Tell a health care provider about: Any allergies you have. All medicines you are taking, including vitamins, herbs, eye drops, creams, and over-the-counter medicines. Any problems you or family members have had with anesthetic medicines. Any blood disorders you have. Any surgeries you have had. Any medical conditions you have. Any antibiotic medicines you are required to take before dental procedures. Whether you are pregnant or may be pregnant. What are the risks? Generally, this is a safe procedure. However, problems may occur, including: Bleeding due to a tear in the lining of the esophagus. A hole, or perforation, in the esophagus. What happens before the procedure? Ask your health care provider about: Changing or stopping your regular medicines. This is especially important if you are taking diabetes medicines or blood thinners. Taking medicines such as aspirin and ibuprofen. These medicines can thin your blood. Do not take these medicines unless your health care provider tells you to take them. Taking over-the-counter medicines, vitamins, herbs, and supplements. Follow instructions from your health care provider about eating or drinking restrictions. Plan to have a responsible adult take you home from the hospital or clinic. Plan to have a responsible adult care for you for the time you are told after you leave the hospital or clinic. This is important. What happens during the procedure? You may be given  a medicine to help you relax (sedative). A numbing medicine may be sprayed into the back of your throat, or you may gargle the medicine. Your health care provider may perform the dilatation using various surgical instruments, such as: Simple dilators. This instrument is carefully placed in the esophagus to stretch it. Guided wire bougies. This involves using an endoscope to insert a wire into the esophagus. A dilator is passed over this wire to enlarge the esophagus. Then the wire is removed. Balloon dilators. An endoscope with a small balloon is inserted into the esophagus. The balloon is inflated to stretch the esophagus and open it up. The procedure may vary among health care providers and hospitals. What can I expect after the procedure? Your blood pressure, heart rate, breathing rate, and blood oxygen level will be monitored until you leave the hospital or clinic. Your throat may feel slightly sore and numb. This will get better over time. You will not be allowed to eat or drink until your throat is no longer numb. When you are able to drink, urinate, and sit on the edge of the bed without nausea or dizziness, you may  be able to return home. Follow these instructions at home: Take over-the-counter and prescription medicines only as told by your health care provider. If you were given a sedative during the procedure, it can affect you for several hours. Do not drive or operate machinery until your health care provider says that it is safe. Plan to have a responsible adult care for you for the time you are told. This is important. Follow instructions from your health care provider about any eating or drinking restrictions. Do not use any products that contain nicotine or tobacco, such as cigarettes, e-cigarettes, and chewing tobacco. If you need help quitting, ask your health care provider. Keep all follow-up visits. This is important. Contact a health care provider if: You have a fever. You  have pain that is not relieved by medicine. Get help right away if: You have chest pain. You have trouble breathing. You have trouble swallowing. You vomit blood. You have black, tarry, or bloody stools. These symptoms may represent a serious problem that is an emergency. Do not wait to see if the symptoms will go away. Get medical help right away. Call your local emergency services (911 in the [place] Do not drive yourself to the hospital. Summary Esophageal dilatation, also called esophageal dilation, is a procedure to widen or open a blocked or narrowed part of the esophagus. Plan to have a responsible adult take you home from the hospital or clinic. For this procedure, a numbing medicine may be sprayed into the back of your throat, or you may gargle the medicine. Do not drive or operate machinery until your health care provider says that it is safe. This information is not intended to replace advice given to you by your health care provider. Make sure you discuss any questions you have with your health care provider. Document Revised: 09/10/2019 Document Reviewed: 09/10/2019 Elsevier Patient Education  2022 Elsevier Inc. Monitored Anesthesia Care, Care After This sheet gives you information about how to care for yourself after your procedure. Your health care provider may also give you more specific instructions. If you have problems or questions, contact your health care provider. What can I expect after the procedure? After the procedure, it is common to have: Tiredness. Forgetfulness about what happened after the procedure. Impaired judgment for important decisions. Nausea or vomiting. Some difficulty with balance. Follow these instructions at home: For the time period you were told by your health care provider:   Rest as needed. Do not participate in activities where you could fall or become injured. Do not drive or use machinery. Do not drink alcohol. Do not take sleeping  pills or medicines that cause drowsiness. Do not make important decisions or sign legal documents. Do not take care of children on your own. Eating and drinking Follow the diet that is recommended by your health care provider. Drink enough fluid to keep your urine pale yellow. If you vomit: Drink water, juice, or soup when you can drink without vomiting. Make sure you have little or no nausea before eating solid foods. General instructions Have a responsible adult stay with you for the time you are told. It is important to have someone help care for you until you are awake and alert. Take over-the-counter and prescription medicines only as told by your health care provider. If you have sleep apnea, surgery and certain medicines can increase your risk for breathing problems. Follow instructions from your health care provider about wearing your sleep device: Anytime you are sleeping, including  during daytime naps. While taking prescription pain medicines, sleeping medicines, or medicines that make you drowsy. Avoid smoking. Keep all follow-up visits as told by your health care provider. This is important. Contact a health care provider if: You keep feeling nauseous or you keep vomiting. You feel light-headed. You are still sleepy or having trouble with balance after 24 hours. You develop a rash. You have a fever. You have redness or swelling around the IV site. Get help right away if: You have trouble breathing. You have new-onset confusion at home. Summary For several hours after your procedure, you may feel tired. You may also be forgetful and have poor judgment. Have a responsible adult stay with you for the time you are told. It is important to have someone help care for you until you are awake and alert. Rest as told. Do not drive or operate machinery. Do not drink alcohol or take sleeping pills. Get help right away if you have trouble breathing, or if you suddenly become  confused. This information is not intended to replace advice given to you by your health care provider. Make sure you discuss any questions you have with your health care provider. Document Revised: 01/08/2020 Document Reviewed: 03/27/2019 Elsevier Patient Education  2022 [employer].

## 2021-05-16 ENCOUNTER — Encounter: Payer: Self-pay | Admitting: *Deleted

## 2021-05-16 NOTE — Progress Notes (Signed)
[facility] sent fax notification that repatha prior auth denied due to praluent never tried and is on formulary. See scanned document for details

## 2021-05-17 ENCOUNTER — Encounter (HOSPITAL_COMMUNITY)
Admission: RE | Admit: 2021-05-17 | Discharge: 2021-05-17 | Disposition: A | Discharge status: Home / Self Care | Payer: Medicare HMO | Source: Ambulatory Visit | Attending: Internal Medicine | Admitting: Internal Medicine

## 2021-05-17 ENCOUNTER — Telehealth: Payer: Self-pay | Admitting: *Deleted

## 2021-05-17 ENCOUNTER — Telehealth: Payer: Self-pay | Admitting: Internal Medicine

## 2021-05-17 DIAGNOSIS — E1142 Type 2 diabetes mellitus with diabetic polyneuropathy: Secondary | ICD-10-CM

## 2021-05-17 NOTE — Telephone Encounter (Signed)
Received call from pt. She had wrote the wrong date down for her pre-op appt.  Called endo and she can go tomorrow at 1:15pm.  Called pt back and she was fine with this. Procedure back on for Friday.

## 2021-05-17 NOTE — Pre-Procedure Instructions (Signed)
Note sent to [facility] @ [facility], that patient was a no show for her pre-op.

## 2021-05-17 NOTE — Telephone Encounter (Signed)
Called pt, LMOVM to call back. Patient already taken off schedule 1/13 by endo team.   Tiffany Kocher, PA-C   Note Reviewed updated medication list.       Please schedule patient for EGD+/-ED with [facility]. ASA 3. We discussed at time of OV. Dx: epigastric pain, vomiting, weight loss, esophageal dysphagia.  She will need to hold Ozempic for one week if still on it. (She told us she was not on it but it is on her PCP OV note today). Please let pt know she can take Miralax 17grams (one capful) every day for constipation.

## 2021-05-17 NOTE — Telephone Encounter (Signed)
See prior note

## 2021-05-17 NOTE — Telephone Encounter (Signed)
-----   Message from Encarnacion Chu, RN sent at 05/17/2021  2:23 PM EST ----- Regarding: no show Dana Romero Dana Romero! Dana Romero did not show for her pre-op today. I will have Hoyle Sauer put her in the depot so she can be rescheduled.

## 2021-05-17 NOTE — Telephone Encounter (Signed)
Pt returning call. [phone]

## 2021-05-18 ENCOUNTER — Telehealth: Payer: Self-pay | Admitting: Cardiology

## 2021-05-18 ENCOUNTER — Encounter (HOSPITAL_COMMUNITY)
Admission: RE | Admit: 2021-05-18 | Discharge: 2021-05-18 | Disposition: A | Discharge status: Home / Self Care | Payer: Medicare HMO | Source: Ambulatory Visit | Attending: Internal Medicine | Admitting: Internal Medicine

## 2021-05-18 ENCOUNTER — Encounter (HOSPITAL_COMMUNITY): Payer: Self-pay

## 2021-05-18 ENCOUNTER — Telehealth: Payer: Self-pay | Admitting: *Deleted

## 2021-05-18 DIAGNOSIS — Z01818 Encounter for other preprocedural examination: Secondary | ICD-10-CM | POA: Insufficient documentation

## 2021-05-18 DIAGNOSIS — E1142 Type 2 diabetes mellitus with diabetic polyneuropathy: Secondary | ICD-10-CM | POA: Insufficient documentation

## 2021-05-18 HISTORY — DX: Cerebral infarction, unspecified: I63.9

## 2021-05-18 HISTORY — DX: Diaphragmatic hernia without obstruction or gangrene: K44.9

## 2021-05-18 HISTORY — DX: Other forms of angina pectoris: I20.8

## 2021-05-18 HISTORY — DX: Other forms of angina pectoris: I20.89

## 2021-05-18 LAB — BASIC METABOLIC PANEL
Anion gap: 8 (ref 5–15)
BUN: 20 mg/dL (ref 8–23)
CO2: 27 mmol/L (ref 22–32)
Calcium: 9.1 mg/dL (ref 8.9–10.3)
Chloride: 104 mmol/L (ref 98–111)
Creatinine, Ser: 1.32 mg/dL — ABNORMAL HIGH (ref 0.44–1.00)
GFR, Estimated: 43 mL/min — ABNORMAL LOW (ref >60)
Glucose, Bld: 109 mg/dL — ABNORMAL HIGH (ref 70–99)
Potassium: 4.3 mmol/L (ref 3.5–5.1)
Sodium: 139 mmol/L (ref 135–145)

## 2021-05-18 NOTE — Telephone Encounter (Signed)
-----   Message from Jethro Bolus, RN sent at 05/18/2021  2:17 PM EST ----- Regarding: Reschedule Patient had to be cancelled due to chest pain episode this weekend. She no longer has a prescription for Nitro and she hasn't has chest pain that required medication for relief in the last 2 years. Dr Emeterio Reeve wants her to follow up with cardiology for clearance before we proceed.

## 2021-05-18 NOTE — Telephone Encounter (Signed)
Patient was on for Friday 1/13 with Dr. Marletta Lor. FYI to [place].

## 2021-05-18 NOTE — Progress Notes (Signed)
Patient is scheduled for an EGD on 05/20/2021.  She had an episode of severe chest pain that lasted a good length of time approximately 4 days ago.  She no longer has a prescription for Nitroglycerin so she used Aspirin 325 mg and did have relief.   We will reschedule after she follows up with cardiology for an evaluation per Dr Johnnette Litter.

## 2021-05-18 NOTE — Telephone Encounter (Signed)
Pre-admis. called stating patient is having chest pain and didn't have any nitro with her.  They would like her seen ASAP.  First available I found was in April.  She is suppose to have an EDG on 05/20/21.

## 2021-05-19 NOTE — Telephone Encounter (Signed)
Left message to return call 

## 2021-05-19 NOTE — Telephone Encounter (Signed)
Call placed to patient - stated that she is not having any active or new symptoms.  Stated that pre-admit was concerned that she did not have any Nitroglycerin to take if needed.  Stated that she was dx with angina years ago & only uses this periodically.  Did have some angina on Saturday, but did not have any Ntg.  States they have taken her off the schedule & as soon as we give the okay they will reschedule her.  Patient last seen April 2022 - was due for her 6 mo f/u this past October.

## 2021-05-20 ENCOUNTER — Encounter (HOSPITAL_COMMUNITY): Admission: RE | Payer: Self-pay | Source: Home / Self Care

## 2021-05-20 ENCOUNTER — Ambulatory Visit (HOSPITAL_COMMUNITY): Admission: RE | Admit: 2021-05-20 | Payer: Medicare HMO | Source: Home / Self Care

## 2021-05-20 SURGERY — ESOPHAGOGASTRODUODENOSCOPY (EGD) WITH PROPOFOL
Anesthesia: Monitor Anesthesia Care

## 2021-05-20 NOTE — Telephone Encounter (Signed)
Left message to return call x 2

## 2021-05-24 NOTE — Telephone Encounter (Signed)
noted 

## 2021-05-24 NOTE — Telephone Encounter (Signed)
Call placed to contact number - friend Mikki Santee Raber) - was able to speak with her at this number.  Patient notified and verbalized understanding.  OV scheduled for 07/04/2021 in [place] office with Dr. Debara Pickett.  Also wait listed as well.    Left message to return call x 3.

## 2021-05-24 NOTE — Telephone Encounter (Signed)
Noted. Will patient call us after she sees cardiology or can we make NIC to recheck status in 1-2 months? I don't want her to fall through the cracks.

## 2021-06-06 ENCOUNTER — Encounter: Payer: Self-pay | Admitting: *Deleted

## 2021-06-06 ENCOUNTER — Ambulatory Visit (INDEPENDENT_AMBULATORY_CARE_PROVIDER_SITE_OTHER): Payer: Medicare HMO | Admitting: Physician Assistant

## 2021-06-06 ENCOUNTER — Other Ambulatory Visit: Payer: Self-pay

## 2021-06-06 ENCOUNTER — Encounter: Payer: Self-pay | Admitting: Physician Assistant

## 2021-06-06 VITALS — BP 144/90 | HR 67 | Ht 64.0 in | Wt 145.6 lb

## 2021-06-06 DIAGNOSIS — Z01818 Encounter for other preprocedural examination: Secondary | ICD-10-CM | POA: Diagnosis not present

## 2021-06-06 DIAGNOSIS — R079 Chest pain, unspecified: Secondary | ICD-10-CM

## 2021-06-06 DIAGNOSIS — E785 Hyperlipidemia, unspecified: Secondary | ICD-10-CM

## 2021-06-06 DIAGNOSIS — I10 Essential (primary) hypertension: Secondary | ICD-10-CM | POA: Diagnosis not present

## 2021-06-06 DIAGNOSIS — Z8673 Personal history of transient ischemic attack (TIA), and cerebral infarction without residual deficits: Secondary | ICD-10-CM

## 2021-06-06 DIAGNOSIS — I471 Supraventricular tachycardia: Secondary | ICD-10-CM

## 2021-06-06 MED ORDER — HYDRALAZINE HCL 10 MG PO TABS: 10.0000 mg | ORAL_TABLET | Freq: Two times a day (BID) | ORAL | 11 refills | Status: DC

## 2021-06-06 NOTE — Patient Instructions (Addendum)
Medication Instructions:  Your physician recommends that you continue on your current medications as directed. Please refer to the Current Medication list given to you today.  Hydralazine 10 mg Two Times Daily   *If you need a refill on your cardiac medications before your next appointment, please call your pharmacy*  If you have labs (blood work) drawn today and your tests are completely normal, you will receive your results only by: MyChart Message (if you have MyChart) OR A paper copy in the mail If you have any lab test that is abnormal or we need to change your treatment, we will call you to review the results.   Testing/Procedures: Your physician has requested that you have a lexiscan myoview. For further information please visit [email or web address] Please follow instruction sheet, as given.    Follow-Up: At [facility], you and your health needs are our priority.  As part of our continuing mission to provide you with exceptional heart care, we have created designated Provider Care Teams.  These Care Teams include your primary Cardiologist (physician) and Advanced Practice Providers (APPs -  Physician Assistants and Nurse Practitioners) who all work together to provide you with the care you need, when you need it.  We recommend signing up for the patient portal called "MyChart".  Sign up information is provided on this After Visit Summary.  MyChart is used to connect with patients for Virtual Visits (Telemedicine).  Patients are able to view lab/test results, encounter notes, upcoming appointments, etc.  Non-urgent messages can be sent to your provider as well.   To learn more about what you can do with MyChart, go to [email or web address].    Your next appointment:   6 month(s)  The format for your next appointment:   In Person  Provider:   Nona Dell, MD    Other Instructions Thank you for choosing [facility]!

## 2021-06-06 NOTE — Progress Notes (Addendum)
Cardiology Office Note    Date:  06/06/2021   ID:  Dana Romero, DOB [birth year], MRN [id number]   PCP:  Percell Belt DO   [facility]  Cardiologist:  Rozann Lesches, MD   Advanced Practice Provider:  No care team member to display Electrophysiologist:  None   [id number]   Chief Complaint  Patient presents with   Pre-op Exam    History of Present Illness:  Dana Romero is a 71 y.o. female with history of HTN, HLD, CVA, SVT.   Patient on my schedule for preop clearance for endoscopy. At preop she told them she had chest pain and didn't have any NTG. She said she was told she had angina in the past. She was given NTG by Dr. Orlinda Blalock many years ago. Negative myoview in 2012. She describes chest pain as sharp in center of her chest always at rest and eases in 1-2 min. 4 weeks ago she had an episode that lasted 3-4 min but had run out of NTG. She has a lot of GI symptoms.Has trouble swallowing, vomiting a lot.  No regular exercise because of frequent vertigo and fall risk.To start PT to get some strength in her legs.  No exertional chest pain. BP always runs high. Allergic to a lot of meds.    Past Medical History:  Diagnosis Date   Angina at rest [facility])    Anxiety    Asthma    Caregiver stress 07/04/2011   Chronic pain syndrome    Degenerative disc disease, cervical    Depression    Essential hypertension 1998   GERD (gastroesophageal reflux disease)    Gout    Hiatal hernia    History of pancreatitis 06/08/2005   History of stroke    September 2020   Pelvic mass    Seasonal allergies    Sinusitis, chronic    Stroke [facility])    Transfusion history    Type 2 diabetes mellitus ([place]) 1998    Past Surgical History:  Procedure Laterality Date   APPENDECTOMY     ARM DEBRIDEMENT Left    debridement of axilla "spider bite"   BREAST SURGERY     breast cyst x2 -last '12   CHOLECYSTECTOMY N/A 08/11/2015   Procedure: LAPAROSCOPIC CHOLECYSTECTOMY;   Surgeon: Arta Bruce Kinsinger, MD;  Location: [facility];  Service: General;  Laterality: N/A;   TONSILLECTOMY     TONSILLECTOMY AND ADENOIDECTOMY     TUBAL LIGATION     post partum    Current Medications: Current Meds  Medication Sig   ADVAIR HFA 115-21 MCG/ACT inhaler INHALE 2 PUFFS INTO LUNGS TWICE DAILY   albuterol (PROVENTIL) (2.5 MG/3ML) 0.083% nebulizer solution USE ONE VIAL IN NEBULIZER EVERY 6 HOURS AS NEEDED FOR WHEEZING OR SHORTNESS OF BREATH   albuterol (VENTOLIN HFA) 108 (90 Base) MCG/ACT inhaler INHALE ONE TO TWO PUFFS BY MOUTH EVERY 6 HOURS AS NEEDED FOR WHEEZING OR SHORTNESS OF BREATH (OR PERSISTENT COUGHING)   Alcohol Swabs PADS Use to check blood sugar daily. E11.9   ALPRAZolam (XANAX) 1 MG tablet Take 1 tablet by mouth 2 (two) times daily.   amitriptyline (ELAVIL) 25 MG tablet Take 1 tablet (25 mg total) by mouth at bedtime.   blood glucose meter kit and supplies KIT Dispense based on patient and insurance preference. Use once a day.  DX: E11.9.   blood glucose meter kit and supplies Dispense based on patient and insurance preference. Use up to  four times daily as directed. (FOR ICD-10 E11.9).   Blood Glucose Monitoring Suppl (FREESTYLE LITE) DEVI    carvedilol (COREG) 25 MG tablet Take 1 tablet (25 mg total) by mouth 2 (two) times daily with a meal. (Patient taking differently: Take 25 mg by mouth at bedtime.)   EPINEPHrine (EPIPEN 2-PAK) 0.3 mg/0.3 mL IJ SOAJ injection Inject 0.3 mLs (0.3 mg total) into the muscle once.   esomeprazole (NEXIUM) 40 MG capsule TAKE ONE CAPSULE BY MOUTH EVERY DAY BEFORE BREAKFAST   estradiol (CLIMARA - DOSED IN MG/24 HR) 0.075 mg/24hr patch Place 0.075 mg onto the skin once a week.   FLUoxetine (PROZAC) 20 MG tablet Take 4 tablets (80 mg total) by mouth daily.   FLUoxetine (PROZAC) 40 MG capsule Take 80 mg by mouth daily.   fluticasone (FLONASE) 50 MCG/ACT nasal spray Place 2 sprays into both nostrils daily.   Garlic (GARLIQUE) 801 MG TBEC Take  400 mg by mouth in the morning, at noon, and at bedtime.   glucose blood test strip Test blood sugar daily.  DX: E11.9   glucose monitoring kit (FREESTYLE) monitoring kit Test blood sugar daily. DX: E11.9   hydrALAZINE (APRESOLINE) 10 MG tablet Take 1 tablet (10 mg total) by mouth 2 (two) times daily.   HYDROcodone-acetaminophen (NORCO) 10-325 MG per tablet Take 1 tablet by mouth every 6 (six) hours as needed for moderate pain or severe pain.   Lancet Devices (LANCING DEVICE) MISC Use to test blood sugar daily. E11.9   Lancets MISC Test blood sugar daily. DX: E11.9   levocetirizine (XYZAL) 5 MG tablet Take 5 mg by mouth every evening.   magnesium oxide (MAG-OX) 400 MG tablet Take 400 mg by mouth daily.   meclizine (ANTIVERT) 25 MG tablet Take 25 mg by mouth 3 (three) times daily as needed for dizziness.   medroxyPROGESTERone (PROVERA) 2.5 MG tablet TAKE ONE TABLET BY MOUTH ONCE DAILY   naloxone (NARCAN) 0.4 MG/ML injection Inject 0.4 mg into the vein as needed (opiod overdose).   ondansetron (ZOFRAN) 8 MG tablet Take 1 tablet (8 mg total) by mouth every 8 (eight) hours as needed for nausea.   polyethylene glycol (MIRALAX / GLYCOLAX) 17 g packet Take 17 g by mouth 2 (two) times a week.   promethazine (PHENERGAN) 12.5 MG tablet TAKE ONE TABLET BY MOUTH EVERY 8 HOURS AS NEEDED. NEED CHECK UP/LABS FOR ADDITIONAL REFILLS   Semaglutide (OZEMPIC, 0.25 OR 0.5 MG/DOSE, [place]) Inject 0.5 mg into the skin every 7 (seven) days.   tiZANidine (ZANAFLEX) 4 MG tablet Take 4 mg by mouth every 6 (six) hours as needed for muscle spasms.     Allergies:   Lyrica [pregabalin], Amlodipine besylate, Atorvastatin, Ezetimibe-simvastatin, Losartan potassium-hctz, Morphine and related, Niacin, Rosuvastatin, Statins, Toprol xl [metoprolol succinate], Valium [diazepam], and Latex   Social History   Socioeconomic History   Marital status: Single    Spouse name: Not on file   Number of children: Not on file   Years of  education: Not on file   Highest education level: Not on file  Occupational History   Occupation: Retired  Tobacco Use   Smoking status: Never   Smokeless tobacco: Never  Vaping Use   Vaping Use: Never used  Substance and Sexual Activity   Alcohol use: No   Drug use: No   Sexual activity: Yes    Birth control/protection: Post-menopausal  Other Topics Concern   Not on file  Social History Narrative   Not on  file   Social Determinants of Health   Financial Resource Strain: Not on file  Food Insecurity: Not on file  Transportation Needs: Not on file  Physical Activity: Not on file  Stress: Not on file  Social Connections: Not on file     Family History:  The patient's  family history includes Breast cancer in her maternal aunt and maternal grandmother; Depression in her mother; Diabetes in her maternal aunt, maternal grandmother, and maternal uncle; Heart attack in her maternal grandmother; Hypertension in her brother, maternal grandmother, maternal uncle, maternal uncle, and mother; Pancreatic cancer in her brother; Stomach cancer (age of onset: 35) in her mother; Stroke in an other family member.   ROS:   Please see the history of present illness.    ROS All other systems reviewed and are negative.   PHYSICAL EXAM:   VS:  BP (!) 144/90    Pulse 67    Ht $R'5\' 4"'rn$  (1.626 m)    Wt 145 lb 9.6 oz (66 kg)    LMP 03/15/2014    SpO2 99%    BMI 24.99 kg/m   Physical Exam  GEN: Well nourished, well developed, in no acute distress  Neck: no JVD, carotid bruits, or masses Cardiac:RRR; no murmurs, rubs, or gallops  Respiratory:  clear to auscultation bilaterally, normal work of breathing GI: soft, nontender, nondistended, + BS Ext: without cyanosis, clubbing, or edema, Good distal pulses bilaterally Neuro:  Alert and Oriented x 3 Psych: euthymic mood, full affect  Wt Readings from Last 3 Encounters:  06/06/21 145 lb 9.6 oz (66 kg)  05/18/21 145 lb (65.8 kg)  04/13/21 145 lb (65.8  kg)      Studies/Labs Reviewed:   EKG:  EKG is not ordered today.     Recent Labs: 06/30/2020: ALT 43; Hemoglobin 12.4; Platelets 310 05/18/2021: BUN 20; Creatinine, Ser 1.32; Potassium 4.3; Sodium 139   Lipid Panel    Component Value Date/Time   CHOL 224 (H) 05/26/2020 1117   TRIG 103 05/26/2020 1117   HDL 53 05/26/2020 1117   CHOLHDL 4.2 05/26/2020 1117   VLDL 21 05/26/2020 1117   LDLCALC 150 (H) 05/26/2020 1117   LDLDIRECT 133 (H) 05/09/2012 1414    Additional studies/ records that were reviewed today include:   NST 06/14/21   Findings are consistent with no prior ischemia. The study is low risk.   No ST deviation was noted.   Left ventricular function is normal. End diastolic cavity size is normal. End systolic cavity size is normal.   Prior study available for comparison from 01/30/2011. No changes compared to prior study.   Findings: Negative for stress induced arrhythmias. Hypertensive prior to study. There subdiaphragmatic attenuation in both rest and stress, more prominent in rest. There is a perfusion defect in rest that improves with stress without wall motion abnormality consistent with artifact from this attenuation.   Conclusions: Stress test is negative. Low risk study. No significant change from prior.    NST 2012 IMPRESSION:   1. No significant reversibility.  2.  Normal function with an estimated ejection fraction of 71%  3.  Normal wall motion and contractility.   Original Report Authenticated By: Resa Miner. MATTERN, M.D.  Cardiac monitor January 2021: Zio patch reviewed.  12 days 5 hours analyzed.  Predominant rhythm is sinus with heart rate ranging from 63 bpm up to 114 bpm and average heart rate 81 bpm.  Rare PACs and PVCs noted representing less than 1% of total  beats.  There were a few, generally brief episodes of SVT, one aberrantly conducted episode of 4 beats, longest SVT episode lasted 13 seconds.  Heart rate up into the 190s during brief  SVT.  A few isolated complexes look to be possible preexcitation.  There were no sustained arrhythmias or pauses.    Risk Assessment/Calculations:         ASSESSMENT:    1. Preoperative clearance   2. Chest pain, unspecified type   3. Essential hypertension   4. Hyperlipidemia, unspecified hyperlipidemia type   5. SVT (supraventricular tachycardia) ([place])   6. History of stroke      PLAN:  In order of problems listed above:  Preop clearance for endoscopy by Dr. Abbey Chatters, overall low risk procedure.  Patient has history of chest pain and was given nitroglycerin many years ago.  Normal NST in 2012.  Chest pain at rest and sharp shooting and also has a lot of GI symptoms.  No exertional symptoms.  She does have multiple CV risk factors.  Will order Lexiscan to rule out ischemia.  If negative she can proceed with endoscopy.  Addendum: NST no ischemia, low risk. Can proceed with endoscopy by Dr. Abbey Chatters.  According to the Revised Cardiac Risk Index (RCRI), her Perioperative Risk of Major Cardiac Event is (%): 0.9  Her Functional Capacity in METs is: 5.38 according to the Duke Activity Status Index (DASI).   History of chest pain as above  HTN uncontrolled.  Allergies list amlodipine, losartan HCTZ.  Also has CKD.  We will try low-dose hydralazine 10 mg twice a day  HLD on Repatha  PSVT no recent symptoms is on carvedilol 25 twice daily  History of right thalamic CVA, no longer on Plavix       Shared Decision Making/Informed Consent        Medication Adjustments/Labs and Tests Ordered: Current medicines are reviewed at length with the patient today.  Concerns regarding medicines are outlined above.  Medication changes, Labs and Tests ordered today are listed in the Patient Instructions below. Patient Instructions  Medication Instructions:  Your physician recommends that you continue on your current medications as directed. Please refer to the Current Medication list given  to you today.  Hydralazine 10 mg Two Times Daily   *If you need a refill on your cardiac medications before your next appointment, please call your pharmacy*  If you have labs (blood work) drawn today and your tests are completely normal, you will receive your results only by: [facility] (if you have MyChart) OR A paper copy in the mail If you have any lab test that is abnormal or we need to change your treatment, we will call you to review the results.   Testing/Procedures: Your physician has requested that you have a lexiscan myoview. For further information please visit [email or web address]. Please follow instruction sheet, as given.    Follow-Up: At [facility], you and your health needs are our priority.  As part of our continuing mission to provide you with exceptional heart care, we have created designated Provider Care Teams.  These Care Teams include your primary Cardiologist (physician) and Advanced Practice Providers (APPs -  Physician Assistants and Nurse Practitioners) who all work together to provide you with the care you need, when you need it.  We recommend signing up for the patient portal called "MyChart".  Sign up information is provided on this After Visit Summary.  MyChart is used to connect with patients for Virtual Visits (Telemedicine).  Patients are able to view lab/test results, encounter notes, upcoming appointments, etc.  Non-urgent messages can be sent to your provider as well.   To learn more about what you can do with MyChart, go to [email or web address].    Your next appointment:   6 month(s)  The format for your next appointment:   In Person  Provider:   Rozann Lesches, MD    Other Instructions Thank you for choosing [facility]!      Sumner Boast, PA-C  06/06/2021 11:59 AM    [facility] [place], [place], [place]  [address] Phone: [phone]; Fax: [phone]

## 2021-06-14 ENCOUNTER — Encounter (HOSPITAL_COMMUNITY)
Admission: RE | Admit: 2021-06-14 | Discharge: 2021-06-14 | Disposition: A | Discharge status: Home / Self Care | Payer: Medicare HMO | Source: Ambulatory Visit | Attending: Physician Assistant | Admitting: Physician Assistant

## 2021-06-14 ENCOUNTER — Other Ambulatory Visit: Payer: Self-pay

## 2021-06-14 ENCOUNTER — Ambulatory Visit (HOSPITAL_COMMUNITY)
Admission: RE | Admit: 2021-06-14 | Discharge: 2021-06-14 | Disposition: A | Discharge status: Home / Self Care | Payer: Medicare HMO | Source: Ambulatory Visit | Attending: Physician Assistant | Admitting: Physician Assistant

## 2021-06-14 ENCOUNTER — Encounter (HOSPITAL_COMMUNITY): Payer: Self-pay

## 2021-06-14 DIAGNOSIS — R079 Chest pain, unspecified: Secondary | ICD-10-CM | POA: Insufficient documentation

## 2021-06-14 HISTORY — DX: Disorder of kidney and ureter, unspecified: N28.9

## 2021-06-14 LAB — NM MYOCAR MULTI W/SPECT W/WALL MOTION / EF
LV dias vol: 67 mL (ref 46–106)
LV sys vol: 26 mL
Nuc Stress EF: 61 %
Peak HR: 100 {beats}/min
RATE: 0.3
Rest HR: 81 {beats}/min
Rest Nuclear Isotope Dose: 11 mCi
SDS: 0
SRS: 0
SSS: 0
ST Depression (mm): 0 mm
Stress Nuclear Isotope Dose: 30 mCi
TID: 0.87

## 2021-06-14 MED ORDER — TECHNETIUM TC 99M TETROFOSMIN IV KIT
10.0000 | PACK | Freq: Once | INTRAVENOUS | Status: AC | PRN
  Administered 2021-06-14: 11 via INTRAVENOUS

## 2021-06-14 MED ORDER — SODIUM CHLORIDE FLUSH 0.9 % IV SOLN
INTRAVENOUS | Status: AC
  Filled 2021-06-14: qty 10

## 2021-06-14 MED ORDER — TECHNETIUM TC 99M TETROFOSMIN IV KIT
30.0000 | PACK | Freq: Once | INTRAVENOUS | Status: AC | PRN
  Administered 2021-06-14: 30 via INTRAVENOUS

## 2021-06-14 MED ORDER — REGADENOSON 0.4 MG/5ML IV SOLN
INTRAVENOUS | Status: AC
  Administered 2021-06-14: 0.4 mg via INTRAVENOUS
  Filled 2021-06-14: qty 5

## 2021-06-14 NOTE — Telephone Encounter (Signed)
Called pt. She has been scheduled for 2/23 at 7:30am. Aware will send instructions to her and let her know when pre-op will be.

## 2021-06-15 ENCOUNTER — Ambulatory Visit: Payer: Medicare HMO | Admitting: Podiatrist

## 2021-06-15 NOTE — Telephone Encounter (Signed)
Called pt. Aware of preop appt details. She voiced understanding

## 2021-06-16 ENCOUNTER — Telehealth: Payer: Self-pay

## 2021-06-16 NOTE — Telephone Encounter (Signed)
-----   Message from Dyann Kief, PA-C sent at 06/14/2021 12:04 PM EST ----- Low risk stress test, no ischemia. Can proceed with endoscopy by Dr. Marletta Lor without further work up thanks.

## 2021-06-16 NOTE — Telephone Encounter (Signed)
Patient notified and verbalized understanding. Pt had no questions or concerns at this time. PCP, copied.  ?

## 2021-06-17 ENCOUNTER — Ambulatory Visit: Payer: Medicare HMO | Admitting: Podiatrist

## 2021-06-17 ENCOUNTER — Encounter: Payer: Self-pay | Admitting: Podiatrist

## 2021-06-17 ENCOUNTER — Other Ambulatory Visit: Payer: Self-pay

## 2021-06-17 DIAGNOSIS — M2141 Flat foot [pes planus] (acquired), right foot: Secondary | ICD-10-CM

## 2021-06-17 DIAGNOSIS — E1140 Type 2 diabetes mellitus with diabetic neuropathy, unspecified: Secondary | ICD-10-CM | POA: Diagnosis not present

## 2021-06-17 DIAGNOSIS — M2142 Flat foot [pes planus] (acquired), left foot: Secondary | ICD-10-CM

## 2021-06-17 DIAGNOSIS — M2042 Other hammer toe(s) (acquired), left foot: Secondary | ICD-10-CM | POA: Diagnosis not present

## 2021-06-17 DIAGNOSIS — M79609 Pain in unspecified limb: Secondary | ICD-10-CM

## 2021-06-17 DIAGNOSIS — B351 Tinea unguium: Secondary | ICD-10-CM

## 2021-06-17 NOTE — Patient Instructions (Signed)
Diabetes Mellitus and Foot Care Foot care is an important part of your health, especially when you have diabetes. Diabetes may cause you to have problems because of poor blood flow (circulation) to your feet and legs, which can cause your skin to: Become thinner and drier. Break more easily. Heal more slowly. Peel and crack. You may also have nerve damage (neuropathy) in your legs and feet, causing decreased feeling in them. This means that you may not notice minor injuries to your feet that could lead to more serious problems. Noticing and addressing any potential problems early is the best way to prevent future foot problems. How to care for your feet Foot hygiene  Wash your feet daily with warm water and mild soap. Do not use hot water. Then, pat your feet and the areas between your toes until they are completely dry. Do not soak your feet as this can dry your skin. Trim your toenails straight across. Do not dig under them or around the cuticle. File the edges of your nails with an emery board or nail file. Apply a moisturizing lotion or petroleum jelly to the skin on your feet and to dry, brittle toenails. Use lotion that does not contain alcohol and is unscented. Do not apply lotion between your toes. Shoes and socks Wear clean socks or stockings every day. Make sure they are not too tight. Do not wear knee-high stockings since they may decrease blood flow to your legs. Wear shoes that fit properly and have enough cushioning. Always look in your shoes before you put them on to be sure there are no objects inside. To break in new shoes, wear them for just a few hours a day. This prevents injuries on your feet. Wounds, scrapes, corns, and calluses  Check your feet daily for blisters, cuts, bruises, sores, and redness. If you cannot see the bottom of your feet, use a mirror or ask someone for help. Do not cut corns or calluses or try to remove them with medicine. If you find a minor scrape,  cut, or break in the skin on your feet, keep it and the skin around it clean and dry. You may clean these areas with mild soap and water. Do not clean the area with peroxide, alcohol, or iodine. If you have a wound, scrape, corn, or callus on your foot, look at it several times a day to make sure it is healing and not infected. Check for: Redness, swelling, or pain. Fluid or blood. Warmth. Pus or a bad smell. General tips Do not cross your legs. This may decrease blood flow to your feet. Do not use heating pads or hot water bottles on your feet. They may burn your skin. If you have lost feeling in your feet or legs, you may not know this is happening until it is too late. Protect your feet from hot and cold by wearing shoes, such as at the beach or on hot pavement. Schedule a complete foot exam at least once a year (annually) or more often if you have foot problems. Report any cuts, sores, or bruises to your health care provider immediately. Where to find more information American Diabetes Association: www.diabetes.org Association of Diabetes Care & Education Specialists: www.diabeteseducator.org Contact a health care provider if: You have a medical condition that increases your risk of infection and you have any cuts, sores, or bruises on your feet. You have an injury that is not healing. You have redness on your legs or feet. You  feel burning or tingling in your legs or feet. You have pain or cramps in your legs and feet. Your legs or feet are numb. Your feet always feel cold. You have pain around any toenails. Get help right away if: You have a wound, scrape, corn, or callus on your foot and: You have pain, swelling, or redness that gets worse. You have fluid or blood coming from the wound, scrape, corn, or callus. Your wound, scrape, corn, or callus feels warm to the touch. You have pus or a bad smell coming from the wound, scrape, corn, or callus. You have a fever. You have a red  line going up your leg. Summary Check your feet every day for blisters, cuts, bruises, sores, and redness. Apply a moisturizing lotion or petroleum jelly to the skin on your feet and to dry, brittle toenails. Wear shoes that fit properly and have enough cushioning. If you have foot problems, report any cuts, sores, or bruises to your health care provider immediately. Schedule a complete foot exam at least once a year (annually) or more often if you have foot problems. This information is not intended to replace advice given to you by your health care provider. Make sure you discuss any questions you have with your health care provider.  I have ordered a medication for you that will come from [place] in [place]. They should be calling you to verify insurance and will mail the medication to you. If you live close by then you can go by their pharmacy to pick up the medication. Their phone number is [phone]. If you do not hear from them in the next few days, please give [place] a call at [phone].

## 2021-06-17 NOTE — Progress Notes (Signed)
Subjective: Dana Romero is a 71 y.o. female patient with history of diabetes who presents to office today complaining of long and thick left hallux toenail. It is so thick she is unable to trim. She also relates she has pain in her feet as well as numbess to her feet and would like to know if diabetic shoes might be helpful for her.  Patient states that the last HgA1c was 7.5.  she states she has numbness to her feet and toes. She also relates her third to on the left foot is paralyzed and goes under her second toe.     Patient Active Problem List   Diagnosis Date Noted   N&V (nausea and vomiting) 04/13/2021   Esophageal dysphagia 04/13/2021   Constipation 04/13/2021   Abdominal pain, chronic, epigastric 04/13/2021   Gout 05/18/2020   Esophageal reflux 05/18/2020   Asthma 05/18/2020   Nausea 05/18/2020   Menopausal and female climacteric states 05/18/2020   Pure hypercholesterolemia 04/18/2016   Allergic rhinitis 05/09/2012   Essential hypertension 07/26/2007   DM II (diabetes mellitus, type II), controlled ([place]) 11/20/2006    Class: History of   Anxiety state 11/20/2006   Chronic kidney disease 11/20/2006   Osteoarthritis 11/20/2006   DIABETIC PERIPHERAL NEUROPATHY 02/21/2006   Current Outpatient Medications on File Prior to Visit  Medication Sig Dispense Refill   ADVAIR HFA 115-21 MCG/ACT inhaler INHALE 2 PUFFS INTO LUNGS TWICE DAILY 12 Inhaler 0   albuterol (PROVENTIL) (2.5 MG/3ML) 0.083% nebulizer solution USE ONE VIAL IN NEBULIZER EVERY 6 HOURS AS NEEDED FOR WHEEZING OR SHORTNESS OF BREATH 75 mL 0   albuterol (VENTOLIN HFA) 108 (90 Base) MCG/ACT inhaler INHALE ONE TO TWO PUFFS BY MOUTH EVERY 6 HOURS AS NEEDED FOR WHEEZING OR SHORTNESS OF BREATH (OR PERSISTENT COUGHING) 18 each 2   Alcohol Swabs PADS Use to check blood sugar daily. E11.9 100 each 3   ALPRAZolam (XANAX) 1 MG tablet Take 1 tablet by mouth 2 (two) times daily.     amitriptyline (ELAVIL) 25 MG tablet Take 1 tablet  (25 mg total) by mouth at bedtime. 30 tablet 0   blood glucose meter kit and supplies KIT Dispense based on patient and insurance preference. Use once a day.  DX: E11.9. 1 each 0   blood glucose meter kit and supplies Dispense based on patient and insurance preference. Use up to four times daily as directed. (FOR ICD-10 E11.9). 1 each 0   Blood Glucose Monitoring Suppl (FREESTYLE LITE) DEVI      carvedilol (COREG) 25 MG tablet Take 1 tablet (25 mg total) by mouth 2 (two) times daily with a meal. (Patient taking differently: Take 25 mg by mouth at bedtime.) 180 tablet 3   EPINEPHrine (EPIPEN 2-PAK) 0.3 mg/0.3 mL IJ SOAJ injection Inject 0.3 mLs (0.3 mg total) into the muscle once. 1 Device 1   esomeprazole (NEXIUM) 40 MG capsule TAKE ONE CAPSULE BY MOUTH EVERY DAY BEFORE BREAKFAST 90 capsule 0   estradiol (CLIMARA - DOSED IN MG/24 HR) 0.075 mg/24hr patch Place 0.075 mg onto the skin once a week.     FLUoxetine (PROZAC) 20 MG tablet Take 4 tablets (80 mg total) by mouth daily. 360 tablet 0   FLUoxetine (PROZAC) 40 MG capsule Take 80 mg by mouth daily.     fluticasone (FLONASE) 50 MCG/ACT nasal spray Place 2 sprays into both nostrils daily.     Garlic (GARLIQUE) 947 MG TBEC Take 400 mg by mouth in the morning, at  noon, and at bedtime.     glucose blood test strip Test blood sugar daily.  DX: E11.9 100 each 2   glucose monitoring kit (FREESTYLE) monitoring kit Test blood sugar daily. DX: E11.9 1 each 0   hydrALAZINE (APRESOLINE) 10 MG tablet Take 1 tablet (10 mg total) by mouth 2 (two) times daily. 60 tablet 11   HYDROcodone-acetaminophen (NORCO) 10-325 MG per tablet Take 1 tablet by mouth every 6 (six) hours as needed for moderate pain or severe pain.     Lancet Devices (LANCING DEVICE) MISC Use to test blood sugar daily. E11.9 1 each 0   Lancets MISC Test blood sugar daily. DX: E11.9 100 each 2   levocetirizine (XYZAL) 5 MG tablet Take 5 mg by mouth every evening.     magnesium oxide (MAG-OX) 400 MG  tablet Take 400 mg by mouth daily.     meclizine (ANTIVERT) 25 MG tablet Take 25 mg by mouth 3 (three) times daily as needed for dizziness.     medroxyPROGESTERone (PROVERA) 2.5 MG tablet TAKE ONE TABLET BY MOUTH ONCE DAILY 90 tablet 0   naloxone (NARCAN) 0.4 MG/ML injection Inject 0.4 mg into the vein as needed (opiod overdose).     ondansetron (ZOFRAN) 8 MG tablet Take 1 tablet (8 mg total) by mouth every 8 (eight) hours as needed for nausea. 24 tablet 1   polyethylene glycol (MIRALAX / GLYCOLAX) 17 g packet Take 17 g by mouth 2 (two) times a week.     promethazine (PHENERGAN) 12.5 MG tablet TAKE ONE TABLET BY MOUTH EVERY 8 HOURS AS NEEDED. NEED CHECK UP/LABS FOR ADDITIONAL REFILLS 30 tablet 0   Semaglutide (OZEMPIC, 0.25 OR 0.5 MG/DOSE, [place]) Inject 0.5 mg into the skin every 7 (seven) days.     tiZANidine (ZANAFLEX) 4 MG tablet Take 4 mg by mouth every 6 (six) hours as needed for muscle spasms.     No current facility-administered medications on file prior to visit.   Allergies  Allergen Reactions   Lyrica [Pregabalin]     Makes pt "out of it"   Amlodipine Besylate     REACTION: intolerance   Atorvastatin     REACTION: intolerance   Ezetimibe-Simvastatin     REACTION: intolerance   Losartan Potassium-Hctz     Pt is unsure    Morphine And Related Other (See Comments)    MIGRAINE   Niacin     REACTION: intolerance   Rosuvastatin     REACTION: intolerance   Statins     Hip pain   Toprol Xl [Metoprolol Succinate] Other (See Comments)    Makes her feel weak   Valium [Diazepam]     Has reverse effect on patient    Latex Rash     Objective: General: Patient is awake, alert, and oriented x 3 and in no acute distress.  Integument: Skin is warm, dry and supple bilateral. Left hallux nail is significantly thick, discolored, dystrophic and clinically mycotic.  Remainder of the nails are also thick, brittle, discolored and mycotic with the left hallux being the most severe and  symptomatic.   Vasculature:  Dorsalis Pedis pulse /4 bilateral. Posterior Tibial pulse  /4 bilateral.  Capillary fill time <3 sec 1-5 bilateral. Positive hair growth to the level of the digits. Temperature gradient within normal limits. No varicosities present bilateral. No edema present bilateral.   Neurology: The patient has absent sensation measured with a 5.07/10g Semmes Weinstein Monofilament 0/10 sites bilateral . Vibratory sensation diminished bilateral  with tuning fork. No Babinski sign present bilateral.   Musculoskeletal: pes planus noted bilateral.  Contracture left second toe with underlapping of the third toe and rigid deformity is noted. No tenderness with calf compression bilateral.  Assessment and Plan:   ICD-10-CM   1. Type 2 diabetes mellitus with diabetic neuropathy, unspecified whether long term insulin use (HCC)  E11.40     2. Hammertoe of left foot  M20.42     3. Pes planus of both feet  M21.41    M21.42     4. Pain due to onychomycosis of nail  B35.1    M79.609         -Examined patient. -Discussed and educated patient on diabetic foot care, especially with  regards to the vascular, neurological and musculoskeletal systems.  -Stressed the importance of good glycemic control and the detriment of not  controlling glucose levels in relation to the foot. -Mechanically debrided all nails 1-5 bilateral using sterile nail nipper and filed with dremel without incident  -did agree that diabetic shoes would be beneficial for her with her foot type and neuropathy. Will have her see Aaron Edelman for these.  - rx for topical antifungal from [employer] called in for her use.  -Patient to return  in 3 months for at risk foot care -Patient advised to call the office if any problems or questions arise in the meantime.  Bronson Ing, DPM

## 2021-06-22 ENCOUNTER — Ambulatory Visit: Payer: Medicare HMO

## 2021-06-22 DIAGNOSIS — M2141 Flat foot [pes planus] (acquired), right foot: Secondary | ICD-10-CM

## 2021-06-22 DIAGNOSIS — E1140 Type 2 diabetes mellitus with diabetic neuropathy, unspecified: Secondary | ICD-10-CM

## 2021-06-22 DIAGNOSIS — M2042 Other hammer toe(s) (acquired), left foot: Secondary | ICD-10-CM

## 2021-06-22 NOTE — Progress Notes (Signed)
SITUATION Reason for Consult: Evaluation for Prefabricated Diabetic Shoes and Bilateral Custom Diabetic Inserts. Patient / Caregiver Report: Patient would like well fitting shoes  OBJECTIVE DATA: Patient History / Diagnosis:    ICD-10-CM   1. Type 2 diabetes mellitus with diabetic neuropathy, unspecified whether long term insulin use (HCC)  E11.40     2. Hammertoe of left foot  M20.42     3. Pes planus of both feet  M21.41    M21.42       Current or Previous Devices:   None and no history  In-Person Foot Examination: Ulcers & Callousing:   None  Toe / Foot Deformities:   - Pes Planus  - Hammertoes   Shoe Size: 8.23M  ORTHOTIC RECOMMENDATION Recommended Devices: - 1x pair prefabricated PDAC approved diabetic shoes: Patient selected Orthofeet 844 8.23M - 3x pair custom-to-patient vacuum formed diabetic insoles.   GOALS OF SHOES AND INSOLES - Reduce shear and pressure - Reduce / Prevent callus formation - Reduce / Prevent ulceration - Protect the fragile healing compromised diabetic foot.  Patient would benefit from diabetic shoes and inserts as patient has diabetes mellitus and the patient has one or more of the following conditions: - History of partial or complete amputation of the foot - History of previous foot ulceration. - History of pre-ulcerative callus - Peripheral neuropathy with evidence of callus formation - Foot deformity - Poor circulation  ACTIONS PERFORMED Patient was casted for insoles via crush box and measured for shoes via brannock device. Procedure was explained and patient tolerated procedure well. All questions were answered and concerns addressed.  PLAN Patient is to ensure treating physician receives and completes diabetic paperwork. Casts and shoe order are to be held until paperwork is received. Once received patient is to be scheduled for fitting in four weeks.

## 2021-06-27 NOTE — Patient Instructions (Signed)
Dana Romero  06/27/2021     @PREFPERIOPPHARMACY @   Your procedure is scheduled on  06/30/2021.   Report to [facility] at  0600 A.M.   Call this number if you have problems the morning of surgery:  [phone]   Remember:  Follow the diet and prep instructions given to you by the office.     Use your nebulizer and your inhaler before you come and bring your rescue inhaler with you.    DO NOT take any medications for diabetes the morning of your procedure.      Take these medicines the morning of surgery with A SIP OF WATER         carvedilol, nexium, prozac, hydrocodone (if needed), zofran (if needed), zanaflex.     Do not wear jewelry, make-up or nail polish.  Do not wear lotions, powders, or perfumes, or deodorant.  Do not shave 48 hours prior to surgery.  Men may shave face and neck.  Do not bring valuables to the hospital.  [facility] is not responsible for any belongings or valuables.  Contacts, dentures or bridgework may not be worn into surgery.  Leave your suitcase in the car.  After surgery it may be brought to your room.  For patients admitted to the hospital, discharge time will be determined by your treatment team.  Patients discharged the day of surgery will not be allowed to drive home and must  have someone with them for 24 hours.    Special instructions:   DO NOT smoke tobacco or vape for 24 hours before your procedure.  Please read over the following fact sheets that you were given. Anesthesia Post-op Instructions and Care and Recovery After Surgery      Upper Endoscopy, Adult, Care After This sheet gives you information about how to care for yourself after your procedure. Your health care provider may also give you more specific instructions. If you have problems or questions, contact your health care provider. What can I expect after the procedure? After the procedure, it is common to have: A sore throat. Mild stomach pain or  discomfort. Bloating. Nausea. Follow these instructions at home:  Follow instructions from your health care provider about what to eat or drink after your procedure. Return to your normal activities as told by your health care provider. Ask your health care provider what activities are safe for you. Take over-the-counter and prescription medicines only as told by your health care provider. If you were given a sedative during the procedure, it can affect you for several hours. Do not drive or operate machinery until your health care provider says that it is safe. Keep all follow-up visits as told by your health care provider. This is important. Contact a health care provider if you have: A sore throat that lasts longer than one day. Trouble swallowing. Get help right away if: You vomit blood or your vomit looks like coffee grounds. You have: A fever. Bloody, black, or tarry stools. A severe sore throat or you cannot swallow. Difficulty breathing. Severe pain in your chest or abdomen. Summary After the procedure, it is common to have a sore throat, mild stomach discomfort, bloating, and nausea. If you were given a sedative during the procedure, it can affect you for several hours. Do not drive or operate machinery until your health care provider says that it is safe. Follow instructions from your health care provider about what to eat  or drink after your procedure. Return to your normal activities as told by your health care provider. This information is not intended to replace advice given to you by your health care provider. Make sure you discuss any questions you have with your health care provider. Document Revised: 02/28/2019 Document Reviewed: 09/24/2017 Elsevier Patient Education  2022 [employer] Esophageal Dilatation Esophageal dilatation, also called esophageal dilation, is a procedure to widen or open a blocked or narrowed part of the esophagus. The esophagus is the part of  the body that moves food and liquid from the mouth to the stomach. You may need this procedure if: You have a buildup of scar tissue in your esophagus that makes it difficult, painful, or impossible to swallow. This can be caused by gastroesophageal reflux disease (GERD). You have cancer of the esophagus. There is a problem with how food moves through your esophagus. In some cases, you may need this procedure repeated at a later time to dilate the esophagus gradually. Tell a health care provider about: Any allergies you have. All medicines you are taking, including vitamins, herbs, eye drops, creams, and over-the-counter medicines. Any problems you or family members have had with anesthetic medicines. Any blood disorders you have. Any surgeries you have had. Any medical conditions you have. Any antibiotic medicines you are required to take before dental procedures. Whether you are pregnant or may be pregnant. What are the risks? Generally, this is a safe procedure. However, problems may occur, including: Bleeding due to a tear in the lining of the esophagus. A hole, or perforation, in the esophagus. What happens before the procedure? Ask your health care provider about: Changing or stopping your regular medicines. This is especially important if you are taking diabetes medicines or blood thinners. Taking medicines such as aspirin and ibuprofen. These medicines can thin your blood. Do not take these medicines unless your health care provider tells you to take them. Taking over-the-counter medicines, vitamins, herbs, and supplements. Follow instructions from your health care provider about eating or drinking restrictions. Plan to have a responsible adult take you home from the hospital or clinic. Plan to have a responsible adult care for you for the time you are told after you leave the hospital or clinic. This is important. What happens during the procedure? You may be given a medicine to  help you relax (sedative). A numbing medicine may be sprayed into the back of your throat, or you may gargle the medicine. Your health care provider may perform the dilatation using various surgical instruments, such as: Simple dilators. This instrument is carefully placed in the esophagus to stretch it. Guided wire bougies. This involves using an endoscope to insert a wire into the esophagus. A dilator is passed over this wire to enlarge the esophagus. Then the wire is removed. Balloon dilators. An endoscope with a small balloon is inserted into the esophagus. The balloon is inflated to stretch the esophagus and open it up. The procedure may vary among health care providers and hospitals. What can I expect after the procedure? Your blood pressure, heart rate, breathing rate, and blood oxygen level will be monitored until you leave the hospital or clinic. Your throat may feel slightly sore and numb. This will get better over time. You will not be allowed to eat or drink until your throat is no longer numb. When you are able to drink, urinate, and sit on the edge of the bed without nausea or dizziness, you may be able to return  home. Follow these instructions at home: Take over-the-counter and prescription medicines only as told by your health care provider. If you were given a sedative during the procedure, it can affect you for several hours. Do not drive or operate machinery until your health care provider says that it is safe. Plan to have a responsible adult care for you for the time you are told. This is important. Follow instructions from your health care provider about any eating or drinking restrictions. Do not use any products that contain nicotine or tobacco, such as cigarettes, e-cigarettes, and chewing tobacco. If you need help quitting, ask your health care provider. Keep all follow-up visits. This is important. Contact a health care provider if: You have a fever. You have pain that  is not relieved by medicine. Get help right away if: You have chest pain. You have trouble breathing. You have trouble swallowing. You vomit blood. You have black, tarry, or bloody stools. These symptoms may represent a serious problem that is an emergency. Do not wait to see if the symptoms will go away. Get medical help right away. Call your local emergency services (911 in the [place] Do not drive yourself to the hospital. Summary Esophageal dilatation, also called esophageal dilation, is a procedure to widen or open a blocked or narrowed part of the esophagus. Plan to have a responsible adult take you home from the hospital or clinic. For this procedure, a numbing medicine may be sprayed into the back of your throat, or you may gargle the medicine. Do not drive or operate machinery until your health care provider says that it is safe. This information is not intended to replace advice given to you by your health care provider. Make sure you discuss any questions you have with your health care provider. Document Revised: 09/10/2019 Document Reviewed: 09/10/2019 Elsevier Patient Education  [place] After This sheet gives you information about how to care for yourself after your procedure. Your health care provider may also give you more specific instructions. If you have problems or questions, contact your health care provider. What can I expect after the procedure? After the procedure, it is common to have: Tiredness. Forgetfulness about what happened after the procedure. Impaired judgment for important decisions. Nausea or vomiting. Some difficulty with balance. Follow these instructions at home: For the time period you were told by your health care provider:   Rest as needed. Do not participate in activities where you could fall or become injured. Do not drive or use machinery. Do not drink alcohol. Do not take sleeping pills or medicines  that cause drowsiness. Do not make important decisions or sign legal documents. Do not take care of children on your own. Eating and drinking Follow the diet that is recommended by your health care provider. Drink enough fluid to keep your urine pale yellow. If you vomit: Drink water, juice, or soup when you can drink without vomiting. Make sure you have little or no nausea before eating solid foods. General instructions Have a responsible adult stay with you for the time you are told. It is important to have someone help care for you until you are awake and alert. Take over-the-counter and prescription medicines only as told by your health care provider. If you have sleep apnea, surgery and certain medicines can increase your risk for breathing problems. Follow instructions from your health care provider about wearing your sleep device: Anytime you are sleeping, including during daytime naps. While  taking prescription pain medicines, sleeping medicines, or medicines that make you drowsy. Avoid smoking. Keep all follow-up visits as told by your health care provider. This is important. Contact a health care provider if: You keep feeling nauseous or you keep vomiting. You feel light-headed. You are still sleepy or having trouble with balance after 24 hours. You develop a rash. You have a fever. You have redness or swelling around the IV site. Get help right away if: You have trouble breathing. You have new-onset confusion at home. Summary For several hours after your procedure, you may feel tired. You may also be forgetful and have poor judgment. Have a responsible adult stay with you for the time you are told. It is important to have someone help care for you until you are awake and alert. Rest as told. Do not drive or operate machinery. Do not drink alcohol or take sleeping pills. Get help right away if you have trouble breathing, or if you suddenly become confused. This information  is not intended to replace advice given to you by your health care provider. Make sure you discuss any questions you have with your health care provider. Document Revised: 01/08/2020 Document Reviewed: 03/27/2019 Elsevier Patient Education  2022 [employer].

## 2021-06-28 ENCOUNTER — Encounter (HOSPITAL_COMMUNITY)
Admission: RE | Admit: 2021-06-28 | Discharge: 2021-06-28 | Disposition: A | Discharge status: Home / Self Care | Payer: Medicare HMO | Source: Ambulatory Visit | Attending: Internal Medicine | Admitting: Internal Medicine

## 2021-06-28 ENCOUNTER — Encounter (HOSPITAL_COMMUNITY): Payer: Self-pay

## 2021-06-30 ENCOUNTER — Ambulatory Visit (HOSPITAL_COMMUNITY): Payer: Medicare HMO | Admitting: Anesthesiology

## 2021-06-30 ENCOUNTER — Ambulatory Visit (HOSPITAL_COMMUNITY)
Admission: RE | Admit: 2021-06-30 | Discharge: 2021-06-30 | Disposition: A | Discharge status: Home / Self Care | Payer: Medicare HMO | Attending: Internal Medicine | Admitting: Internal Medicine

## 2021-06-30 ENCOUNTER — Encounter (HOSPITAL_COMMUNITY): Payer: Self-pay

## 2021-06-30 ENCOUNTER — Encounter (HOSPITAL_COMMUNITY)
Admission: RE | Disposition: A | Discharge status: Home / Self Care | Payer: Self-pay | Source: Home / Self Care | Attending: Internal Medicine

## 2021-06-30 ENCOUNTER — Ambulatory Visit (HOSPITAL_BASED_OUTPATIENT_CLINIC_OR_DEPARTMENT_OTHER): Payer: Medicare HMO | Admitting: Anesthesiology

## 2021-06-30 DIAGNOSIS — K449 Diaphragmatic hernia without obstruction or gangrene: Secondary | ICD-10-CM

## 2021-06-30 DIAGNOSIS — R1310 Dysphagia, unspecified: Secondary | ICD-10-CM | POA: Diagnosis not present

## 2021-06-30 DIAGNOSIS — J45909 Unspecified asthma, uncomplicated: Secondary | ICD-10-CM | POA: Insufficient documentation

## 2021-06-30 DIAGNOSIS — R634 Abnormal weight loss: Secondary | ICD-10-CM | POA: Diagnosis not present

## 2021-06-30 DIAGNOSIS — I10 Essential (primary) hypertension: Secondary | ICD-10-CM | POA: Insufficient documentation

## 2021-06-30 DIAGNOSIS — E119 Type 2 diabetes mellitus without complications: Secondary | ICD-10-CM | POA: Insufficient documentation

## 2021-06-30 DIAGNOSIS — Z794 Long term (current) use of insulin: Secondary | ICD-10-CM | POA: Insufficient documentation

## 2021-06-30 DIAGNOSIS — R12 Heartburn: Secondary | ICD-10-CM | POA: Diagnosis present

## 2021-06-30 DIAGNOSIS — K222 Esophageal obstruction: Secondary | ICD-10-CM | POA: Insufficient documentation

## 2021-06-30 DIAGNOSIS — Z8673 Personal history of transient ischemic attack (TIA), and cerebral infarction without residual deficits: Secondary | ICD-10-CM | POA: Insufficient documentation

## 2021-06-30 DIAGNOSIS — F418 Other specified anxiety disorders: Secondary | ICD-10-CM | POA: Insufficient documentation

## 2021-06-30 DIAGNOSIS — K219 Gastro-esophageal reflux disease without esophagitis: Secondary | ICD-10-CM | POA: Insufficient documentation

## 2021-06-30 DIAGNOSIS — K2970 Gastritis, unspecified, without bleeding: Secondary | ICD-10-CM | POA: Diagnosis not present

## 2021-06-30 DIAGNOSIS — K319 Disease of stomach and duodenum, unspecified: Secondary | ICD-10-CM | POA: Insufficient documentation

## 2021-06-30 HISTORY — PX: BALLOON DILATION: SHX5330

## 2021-06-30 HISTORY — PX: BIOPSY: SHX5522

## 2021-06-30 HISTORY — PX: ESOPHAGOGASTRODUODENOSCOPY (EGD) WITH PROPOFOL: SHX5813

## 2021-06-30 LAB — GLUCOSE, CAPILLARY: Glucose-Capillary: 110 mg/dL — ABNORMAL HIGH (ref 70–99)

## 2021-06-30 SURGERY — ESOPHAGOGASTRODUODENOSCOPY (EGD) WITH PROPOFOL
Anesthesia: General

## 2021-06-30 MED ORDER — LIDOCAINE 2% (20 MG/ML) 5 ML SYRINGE
INTRAMUSCULAR | Status: DC | PRN
Start: 2021-06-30 — End: 2021-06-30
  Administered 2021-06-30: 50 mg via INTRAVENOUS

## 2021-06-30 MED ORDER — FENTANYL CITRATE (PF) 100 MCG/2ML IJ SOLN
INTRAMUSCULAR | Status: AC
  Filled 2021-06-30: qty 2

## 2021-06-30 MED ORDER — FENTANYL CITRATE (PF) 100 MCG/2ML IJ SOLN
50.0000 ug | Freq: Once | INTRAMUSCULAR | Status: AC
  Administered 2021-06-30: 50 ug via INTRAVENOUS

## 2021-06-30 MED ORDER — LACTATED RINGERS IV SOLN: INTRAVENOUS | Status: DC

## 2021-06-30 MED ORDER — PROPOFOL 10 MG/ML IV BOLUS
INTRAVENOUS | Status: DC | PRN
  Administered 2021-06-30: 10 mg via INTRAVENOUS
  Administered 2021-06-30: 60 mg via INTRAVENOUS
  Administered 2021-06-30: 10 mg via INTRAVENOUS
  Administered 2021-06-30: 20 mg via INTRAVENOUS

## 2021-06-30 NOTE — Discharge Instructions (Addendum)
EGD Discharge instructions Please read the instructions outlined below and refer to this sheet in the next few weeks. These discharge instructions provide you with general information on caring for yourself after you leave the hospital. Your doctor may also give you specific instructions. While your treatment has been planned according to the most current medical practices available, unavoidable complications occasionally occur. If you have any problems or questions after discharge, please call your doctor. ACTIVITY You may resume your regular activity but move at a slower pace for the next 24 hours.  Take frequent rest periods for the next 24 hours.  Walking will help expel (get rid of) the air and reduce the bloated feeling in your abdomen.  No driving for 24 hours (because of the anesthesia (medicine) used during the test).  You may shower.  Do not sign any important legal documents or operate any machinery for 24 hours (because of the anesthesia used during the test).  NUTRITION Drink plenty of fluids.  You may resume your normal diet.  Begin with a light meal and progress to your normal diet.  Avoid alcoholic beverages for 24 hours or as instructed by your caregiver.  MEDICATIONS You may resume your normal medications unless your caregiver tells you otherwise.  WHAT YOU CAN EXPECT TODAY You may experience abdominal discomfort such as a feeling of fullness or gas pains.  FOLLOW-UP Your doctor will discuss the results of your test with you.  SEEK IMMEDIATE MEDICAL ATTENTION IF ANY OF THE FOLLOWING OCCUR: Excessive nausea (feeling sick to your stomach) and/or vomiting.  Severe abdominal pain and distention (swelling).  Trouble swallowing.  Temperature over 101 F (37.8 C).  Rectal bleeding or vomiting of blood.    Your EGD revealed moderate amount inflammation in your stomach.  I took biopsies of this to rule out infection with a bacteria called H. pylori.  Await pathology results,  my office will contact you.  You have a moderate size hiatal hernia as well.  You also had a tightening of your esophagus which I stretched today.  Hopefully this is improves your swallowing.  Continue on Nexium.  Follow-up with GI in 4 months   I hope you have a great rest of your week!  Hennie Duos. Marletta Lor, D.O. Gastroenterology and Hepatology [facility]

## 2021-06-30 NOTE — Transfer of Care (Signed)
Immediate Anesthesia Transfer of Care Note  Patient: Dana Romero  Procedure(s) Performed: ESOPHAGOGASTRODUODENOSCOPY (EGD) WITH PROPOFOL BALLOON DILATION BIOPSY  Patient Location: Short Stay  Anesthesia Type:MAC  Level of Consciousness: sedated, patient cooperative and responds to stimulation  Airway & Oxygen Therapy: Patient Spontanous Breathing  Post-op Assessment: Report given to RN and Post -op Vital signs reviewed and stable  Post vital signs: Reviewed and stable  Last Vitals:  Vitals Value Taken Time  BP 132/63 06/30/21 0748  Temp 36.8 C 06/30/21 0748  Pulse 71 06/30/21 0748  Resp 14 06/30/21 0748  SpO2 97 % 06/30/21 0748    Last Pain:  Vitals:   06/30/21 0748  TempSrc: Oral  PainSc:       Patients Stated Pain Goal: 8 (29/52/84 1324)  Complications: No notable events documented.

## 2021-06-30 NOTE — Op Note (Signed)
[facility] Patient Name: Dana Romero Procedure Date: 06/30/2021 7:06 AM MRN: [id number] Date of Birth: 24-May-1950 Attending MD: Elon Alas. Abbey Chatters DO CSN: [id number] Age: 71 Admit Type: Outpatient Procedure:                Upper GI endoscopy Indications:              Dysphagia, Heartburn, Weight loss Providers:                Elon Alas. Abbey Chatters, DO, Tammy Vaught, RN, Hughie Closs RN, RN, Suzan Garibaldi. Risa Grill, Technician Referring MD:              Medicines:                See the Anesthesia note for documentation of the                            administered medications Complications:            No immediate complications. Estimated Blood Loss:     Estimated blood loss was minimal. Procedure:                Pre-Anesthesia Assessment:                           - The anesthesia plan was to use monitored                            anesthesia care (MAC).                           After obtaining informed consent, the endoscope was                            passed under direct vision. Throughout the                            procedure, the patient's blood pressure, pulse, and                            oxygen saturations were monitored continuously. The                            GIF-H190 ([id number]) scope was introduced through the                            mouth, and advanced to the second part of duodenum.                            The upper GI endoscopy was accomplished without                            difficulty. The patient tolerated the procedure  well. Scope In: 7:38:13 AM Scope Out: 7:43:21 AM Total Procedure Duration: 0 hours 5 minutes 8 seconds  Findings:      A medium-sized hiatal hernia was present. Measuring approx 5 cm.      A moderate Schatzki ring was found in the lower third of the esophagus.       A TTS dilator was passed through the scope. Dilation with an 18-19-20 mm       balloon dilator was  performed to 18 mm. The dilation site was examined       and showed mild mucosal disruption and moderate improvement in luminal       narrowing.      Diffuse moderate inflammation characterized by erosions, erythema and       linear erosions was found in the entire examined stomach. Biopsies were       taken with a cold forceps for Helicobacter pylori testing.      The duodenal bulb, first portion of the duodenum and second portion of       the duodenum were normal. Impression:               - Medium-sized hiatal hernia.                           - Moderate Schatzki ring. Dilated.                           - Gastritis. Biopsied.                           - Normal duodenal bulb, first portion of the                            duodenum and second portion of the duodenum. Moderate Sedation:      Per Anesthesia Care Recommendation:           - Patient has a contact number available for                            emergencies. The signs and symptoms of potential                            delayed complications were discussed with the                            patient. Return to normal activities tomorrow.                            Written discharge instructions were provided to the                            patient.                           - Resume previous diet.                           - Continue present medications.                           -  Await pathology results.                           - Repeat upper endoscopy PRN for retreatment.                           - Use Nexium (esomeprazole) 40 mg PO daily.                           - No ibuprofen, naproxen, or other non-steroidal                            anti-inflammatory drugs.                           - Return to GI clinic in 4 months. Procedure Code(s):        --- Professional ---                           [phone], Esophagogastroduodenoscopy, flexible,                            transoral; with transendoscopic balloon dilation  of                            esophagus (less than 30 mm diameter)                           43239, 59, Esophagogastroduodenoscopy, flexible,                            transoral; with biopsy, single or multiple Diagnosis Code(s):        --- Professional ---                           K44.9, Diaphragmatic hernia without obstruction or                            gangrene                           K22.2, Esophageal obstruction                           K29.70, Gastritis, unspecified, without bleeding                           R13.10, Dysphagia, unspecified                           R12, Heartburn                           R63.4, Abnormal weight loss CPT copyright 2019 American Medical Association. All rights reserved. The codes documented in this report are preliminary and upon coder review may  be revised to meet current compliance requirements. Elon Alas. Abbey Chatters, DO [place] Abbey Chatters, DO 06/30/2021 7:49:14 AM This report has  been signed electronically. Number of Addenda: 0

## 2021-06-30 NOTE — Anesthesia Postprocedure Evaluation (Signed)
Anesthesia Post Note  Patient: Lakeia Bradshaw Hight  Procedure(s) Performed: ESOPHAGOGASTRODUODENOSCOPY (EGD) WITH PROPOFOL BALLOON DILATION BIOPSY  Patient location during evaluation: Phase II Anesthesia Type: General Level of consciousness: awake and alert and oriented Pain management: pain level controlled Vital Signs Assessment: post-procedure vital signs reviewed and stable Respiratory status: spontaneous breathing, nonlabored ventilation and respiratory function stable Cardiovascular status: blood pressure returned to baseline and stable Postop Assessment: no apparent nausea or vomiting Anesthetic complications: no   No notable events documented.   Last Vitals:  Vitals:   06/30/21 0719 06/30/21 0748  BP:  132/63  Pulse: 72 71  Resp: 14 14  Temp:  36.8 C  SpO2: 97% 97%    Last Pain:  Vitals:   06/30/21 0748  TempSrc: Oral  PainSc:                  Lealon Vanputten C Renly Roots

## 2021-06-30 NOTE — Anesthesia Preprocedure Evaluation (Signed)
Anesthesia Evaluation  Patient identified by MRN, date of birth, ID band Patient awake    Reviewed: Allergy & Precautions, NPO status , Patient's Chart, lab work & pertinent test results, reviewed documented beta blocker date and time   Airway Mallampati: II  TM Distance: >3 FB Neck ROM: Full    Dental  (+) Edentulous Upper, Edentulous Lower   Pulmonary asthma ,    Pulmonary exam normal breath sounds clear to auscultation       Cardiovascular Exercise Tolerance: Good hypertension, Pt. on home beta blockers and Pt. on medications + angina at rest Normal cardiovascular exam Rhythm:Regular Rate:Normal     Neuro/Psych PSYCHIATRIC DISORDERS Anxiety Depression  Neuromuscular disease CVA, Residual Symptoms    GI/Hepatic Neg liver ROS, hiatal hernia, GERD  Medicated and Poorly Controlled,  Endo/Other  diabetes, Well Controlled, Type 2, Oral Hypoglycemic Agents  Renal/GU Renal InsufficiencyRenal disease  negative genitourinary   Musculoskeletal  (+) Arthritis , Osteoarthritis,    Abdominal   Peds negative pediatric ROS (+)  Hematology negative hematology ROS (+)   Anesthesia Other Findings Severe back pain  Reproductive/Obstetrics negative OB ROS                             Anesthesia Physical  Anesthesia Plan  ASA: 3  Anesthesia Plan: General   Post-op Pain Management: Minimal or no pain anticipated   Induction: Intravenous  PONV Risk Score and Plan: TIVA  Airway Management Planned: Nasal Cannula and Natural Airway  Additional Equipment:   Intra-op Plan:   Post-operative Plan:   Informed Consent: I have reviewed the patients History and Physical, chart, labs and discussed the procedure including the risks, benefits and alternatives for the proposed anesthesia with the patient or authorized representative who has indicated his/her understanding and acceptance.     Dental advisory  given  Plan Discussed with: CRNA and Surgeon  Anesthesia Plan Comments:         Anesthesia Quick Evaluation  

## 2021-06-30 NOTE — H&P (Signed)
Primary Care Physician:  Percell Belt, DO Primary Gastroenterologist:  Dr. Abbey Chatters  Pre-Procedure History & Physical: HPI:  Dana Romero is a 71 y.o. female is here for an EGD with possible dilation for GERD, dysphagia, weight loss.   Past Medical History:  Diagnosis Date   Angina at rest [facility])    Anxiety    Asthma    Caregiver stress 07/04/2011   Chronic pain syndrome    Degenerative disc disease, cervical    Depression    Essential hypertension 1998   GERD (gastroesophageal reflux disease)    Gout    Hiatal hernia    History of pancreatitis 06/08/2005   History of stroke    September 2020   Pelvic mass    Renal insufficiency    Seasonal allergies    Sinusitis, chronic    Stroke [facility])    Transfusion history    Type 2 diabetes mellitus ([place]) 1998    Past Surgical History:  Procedure Laterality Date   APPENDECTOMY     ARM DEBRIDEMENT Left    debridement of axilla "spider bite"   BREAST SURGERY     breast cyst x2 -last '12   CHOLECYSTECTOMY N/A 08/11/2015   Procedure: LAPAROSCOPIC CHOLECYSTECTOMY;  Surgeon: Arta Bruce Kinsinger, MD;  Location: [facility] ORS;  Service: General;  Laterality: N/A;   TONSILLECTOMY     TONSILLECTOMY AND ADENOIDECTOMY     TUBAL LIGATION     post partum    Prior to Admission medications   Medication Sig Start Date End Date Taking? Authorizing Provider  ADVAIR HFA 115-21 MCG/ACT inhaler INHALE 2 PUFFS INTO LUNGS TWICE DAILY Patient taking differently: 2 puffs 2 (two) times daily as needed (respiratory issues.). 04/05/17  Yes Wardell Honour, MD  albuterol (VENTOLIN HFA) 108 (90 Base) MCG/ACT inhaler INHALE ONE TO TWO PUFFS BY MOUTH EVERY 6 HOURS AS NEEDED FOR WHEEZING OR SHORTNESS OF BREATH (OR PERSISTENT COUGHING) 01/18/16  Yes Wardell Honour, MD  ALPRAZolam Duanne Moron) 1 MG tablet Take 0.5-1 mg by mouth See admin instructions. Take 1 tablet (1 mg) by mouth (scheduled) at bedtime & may take up to 2 additional tablets if needed for anxiety. 02/28/21   Yes [provider]  amitriptyline (ELAVIL) 25 MG tablet Take 1 tablet (25 mg total) by mouth at bedtime. 12/15/15  Yes Wardell Honour, MD  Carboxymethylcellulose Sodium (THERATEARS) 0.25 % SOLN Place 1-2 drops into both eyes 3 (three) times daily as needed (dry/irritated eyes.).   Yes [provider]  EPINEPHrine (EPIPEN 2-PAK) 0.3 mg/0.3 mL IJ SOAJ injection Inject 0.3 mLs (0.3 mg total) into the muscle once. 01/07/15  Yes Wardell Honour, MD  esomeprazole (NEXIUM) 40 MG capsule TAKE ONE CAPSULE BY MOUTH EVERY DAY BEFORE BREAKFAST 04/25/17  Yes Wardell Honour, MD  estradiol (VIVELLE-DOT) 0.075 MG/24HR Place 1 patch onto the skin 2 (two) times a week. Typically Tuesdays & Thursdays 06/03/21  Yes [provider]  FLUoxetine (PROZAC) 40 MG capsule Take 80 mg by mouth every evening.   Yes [provider]  fluticasone (FLONASE) 50 MCG/ACT nasal spray Place 2 sprays into both nostrils daily as needed for allergies.   Yes [provider]  Garlic (GARLIQUE) 371 MG TBEC Take 400 mg by mouth in the morning and at bedtime.   Yes [provider]  HYDROcodone-acetaminophen (NORCO) 10-325 MG per tablet Take 1 tablet by mouth 4 (four) times daily as needed for moderate pain or severe pain.   Yes [provider]  levocetirizine (XYZAL) 5 MG tablet Take 5 mg by mouth every evening.   Yes [provider]  magnesium oxide (MAG-OX) 400 MG tablet Take 400 mg by mouth daily.   Yes [provider]  meclizine (ANTIVERT) 25 MG tablet Take 25 mg by mouth 3 (three) times daily as needed for dizziness or nausea. 06/28/20  Yes [provider]  medroxyPROGESTERone (PROVERA) 2.5 MG tablet TAKE ONE TABLET BY MOUTH ONCE DAILY Patient taking differently: Take 2.5 mg by mouth See admin instructions. Take 1 tablet (2.5 mg) by mouth in the evening for 3 weeks, then off 1 week cyclically 0/1/75  Yes Wardell Honour, MD  naloxone [facility]) 0.4 MG/ML  injection Inject 0.4 mg into the vein as needed (opiod overdose).   Yes [provider]  ondansetron (ZOFRAN) 8 MG tablet Take 1 tablet (8 mg total) by mouth every 8 (eight) hours as needed for nausea. 01/22/20  Yes Florian Buff, MD  OZEMPIC, 0.25 OR 0.5 MG/DOSE, 2 MG/1.5ML SOPN Inject 0.5 mg into the skin every Wednesday. 05/10/21  Yes [provider]  polyethylene glycol (MIRALAX / GLYCOLAX) 17 g packet Take 17 g by mouth 2 (two) times a week.   Yes [provider]  tiZANidine (ZANAFLEX) 4 MG tablet Take 4 mg by mouth every 6 (six) hours as needed for muscle spasms.   Yes [provider]  albuterol (PROVENTIL) (2.5 MG/3ML) 0.083% nebulizer solution USE ONE VIAL IN NEBULIZER EVERY 6 HOURS AS NEEDED FOR WHEEZING OR SHORTNESS OF BREATH 09/25/15   Wardell Honour, MD  Alcohol Swabs PADS Use to check blood sugar daily. E11.9 04/07/15   Wardell Honour, MD  blood glucose meter kit and supplies KIT Dispense based on patient and insurance preference. Use once a day.  DX: E11.9. 12/31/14   Wardell Honour, MD  blood glucose meter kit and supplies Dispense based on patient and insurance preference. Use up to four times daily as directed. (FOR ICD-10 E11.9). 03/31/15   Wardell Honour, MD  Blood Glucose Monitoring Suppl (FREESTYLE LITE) DEVI  01/12/15   [provider]  carvedilol (COREG) 25 MG tablet Take 1 tablet (25 mg total) by mouth 2 (two) times daily with a meal. 01/18/16   Wardell Honour, MD  glucose blood test strip Test blood sugar daily.  DX: E11.9 04/06/15   Wardell Honour, MD  glucose monitoring kit (FREESTYLE) monitoring kit Test blood sugar daily. DX: E11.9 01/12/15   Wardell Honour, MD  hydrALAZINE (APRESOLINE) 10 MG tablet Take 1 tablet (10 mg total) by mouth 2 (two) times daily. 06/06/21 09/04/21  Imogene Burn, PA-C  Lancet Devices (LANCING DEVICE) MISC Use to test blood sugar daily. E11.9 04/07/15   Wardell Honour, MD  Lancets MISC Test blood sugar  daily. DX: E11.9 04/06/15   Wardell Honour, MD  promethazine (PHENERGAN) 12.5 MG tablet TAKE ONE TABLET BY MOUTH EVERY 8 HOURS AS NEEDED. NEED CHECK UP/LABS FOR ADDITIONAL REFILLS 11/04/15   Wardell Honour, MD    Allergies as of 06/15/2021 - Review Complete 06/14/2021  Allergen Reaction Noted   Lyrica [pregabalin]  01/22/2020   Amlodipine besylate     Atorvastatin     Ezetimibe-simvastatin     Losartan potassium-hctz  02/28/2011   Morphine and related Other (See Comments) 08/11/2015   Niacin  10/12/2006   Rosuvastatin     Statins  05/11/2021   Toprol xl [metoprolol succinate] Other (See  Comments) 02/28/2011   Valium [diazepam]  04/12/2015   Latex Rash 06/06/2011    Family History  Problem Relation Age of Onset   Hypertension Mother    Depression Mother    Stomach cancer Mother 21   Hypertension Brother    Pancreatic cancer Brother    Diabetes Maternal Grandmother    Hypertension Maternal Grandmother    Breast cancer Maternal Grandmother    Heart attack Maternal Grandmother    Diabetes Maternal Aunt    Breast cancer Maternal Aunt    Diabetes Maternal Uncle    Hypertension Maternal Uncle    Hypertension Maternal Uncle    Stroke Other    Colon cancer Neg Hx     Social History   Socioeconomic History   Marital status: Single    Spouse name: Not on file   Number of children: Not on file   Years of education: Not on file   Highest education level: Not on file  Occupational History   Occupation: Retired  Tobacco Use   Smoking status: Never   Smokeless tobacco: Never  Vaping Use   Vaping Use: Never used  Substance and Sexual Activity   Alcohol use: No   Drug use: No   Sexual activity: Yes    Birth control/protection: Post-menopausal  Other Topics Concern   Not on file  Social History Narrative   Not on file   Social Determinants of Health   Financial Resource Strain: Not on file  Food Insecurity: Not on file  Transportation Needs: Not on file  Physical  Activity: Not on file  Stress: Not on file  Social Connections: Not on file  Intimate Partner Violence: Not on file    Review of Systems: See HPI, otherwise negative ROS  Physical Exam: Vital signs in last 24 hours: Temp:  [98.4 F (36.9 C)] 98.4 F (36.9 C) (02/23 0635) Pulse Rate:  [72-82] 72 (02/23 0719) Resp:  [14-18] 14 (02/23 0719) BP: (175)/(88) 175/88 (02/23 0635) SpO2:  [95 %-97 %] 97 % (02/23 0719)   General:   Alert,  Well-developed, well-nourished, pleasant and cooperative in NAD Head:  Normocephalic and atraumatic. Eyes:  Sclera clear, no icterus.   Conjunctiva pink. Ears:  Normal auditory acuity. Nose:  No deformity, discharge,  or lesions. Mouth:  No deformity or lesions, dentition normal. Neck:  Supple; no masses or thyromegaly. Lungs:  Clear throughout to auscultation.   No wheezes, crackles, or rhonchi. No acute distress. Heart:  Regular rate and rhythm; no murmurs, clicks, rubs,  or gallops. Abdomen:  Soft, nontender and nondistended. No masses, hepatosplenomegaly or hernias noted. Normal bowel sounds, without guarding, and without rebound.   Msk:  Symmetrical without gross deformities. Normal posture. Extremities:  Without clubbing or edema. Neurologic:  Alert and  oriented x4;  grossly normal neurologically. Skin:  Intact without significant lesions or rashes. Cervical Nodes:  No significant cervical adenopathy. Psych:  Alert and cooperative. Normal mood and affect.  Impression/Plan: Dana Romero is here for an EGD with possible dilation for GERD, dysphagia, weight loss.   The risks of the procedure including infection, bleed, or perforation as well as benefits, limitations, alternatives and imponderables have been reviewed with the patient. Questions have been answered. All parties agreeable.

## 2021-07-01 LAB — SURGICAL PATHOLOGY

## 2021-07-04 ENCOUNTER — Ambulatory Visit (INDEPENDENT_AMBULATORY_CARE_PROVIDER_SITE_OTHER): Payer: Medicare HMO | Admitting: Internal Medicine

## 2021-07-04 ENCOUNTER — Encounter: Payer: Self-pay | Admitting: Internal Medicine

## 2021-07-04 ENCOUNTER — Other Ambulatory Visit: Payer: Self-pay

## 2021-07-04 VITALS — BP 126/78 | HR 72 | Ht 64.0 in | Wt 147.8 lb

## 2021-07-04 DIAGNOSIS — I10 Essential (primary) hypertension: Secondary | ICD-10-CM

## 2021-07-04 DIAGNOSIS — Z01818 Encounter for other preprocedural examination: Secondary | ICD-10-CM

## 2021-07-04 DIAGNOSIS — E782 Mixed hyperlipidemia: Secondary | ICD-10-CM | POA: Diagnosis not present

## 2021-07-04 MED ORDER — CARVEDILOL PHOSPHATE ER 40 MG PO CP24: 40.0000 mg | ORAL_CAPSULE | Freq: Every day | ORAL | 3 refills | Status: DC

## 2021-07-04 NOTE — Patient Instructions (Addendum)
Medication Instructions:   STOP Coreg 25 mg twice a day  Take extended release Coreg 40 mg once a day  STOP Hyralazine  Labwork: None today  Testing/Procedures: None today  Follow-Up: 6 months  Any Other Special Instructions Will Be Listed Below (If Applicable).  If you need a refill on your cardiac medications before your next appointment, please call your pharmacy.

## 2021-07-04 NOTE — Progress Notes (Signed)
OFFICE NOTE  Chief Complaint:  Follow-up stress test, preop  Primary Care Physician: Percell Belt, DO  HPI:  Dana Romero is a 71 y.o. female with a past medial history significant for hypertension, dyslipidemia, prior stroke and SVT.  Recently she has been having some swallowing difficulty and had a GI evaluation.  She was scheduled for an EGD but then required cardiovascular clearance.  She saw Dana Husk, PA-C, who ordered a Myoview stress test in January.  This test was negative for ischemia and she successfully underwent EGD as well as esophageal dilatation.  She now returns today for follow-up.  She reports she was seen recently by an ophthalmologist and was felt to have cataracts.  She will need to undergo cataract surgery.  She is here to get clearance for that.  Also she was noted to not be taking her hydralazine which was recommended by Ms. Dana Romero to start in January.  She also has been taking carvedilol 25 mg once daily rather than twice.  Blood pressure today however is controlled at 126/78.  PMHx:  Past Medical History:  Diagnosis Date   Angina at rest [facility])    Anxiety    Asthma    Caregiver stress 07/04/2011   Chronic pain syndrome    Degenerative disc disease, cervical    Depression    Essential hypertension 1998   GERD (gastroesophageal reflux disease)    Gout    Hiatal hernia    History of pancreatitis 06/08/2005   History of stroke    September 2020   Pelvic mass    Renal insufficiency    Seasonal allergies    Sinusitis, chronic    Stroke [facility])    Transfusion history    Type 2 diabetes mellitus ([place]) 1998    Past Surgical History:  Procedure Laterality Date   APPENDECTOMY     ARM DEBRIDEMENT Left    debridement of axilla "spider bite"   BREAST SURGERY     breast cyst x2 -last '12   CHOLECYSTECTOMY N/A 08/11/2015   Procedure: LAPAROSCOPIC CHOLECYSTECTOMY;  Surgeon: Arta Bruce Kinsinger, MD;  Location: [facility] ORS;  Service: General;  Laterality: N/A;    TONSILLECTOMY     TONSILLECTOMY AND ADENOIDECTOMY     TUBAL LIGATION     post partum    FAMHx:  Family History  Problem Relation Age of Onset   Hypertension Mother    Depression Mother    Stomach cancer Mother 50   Hypertension Brother    Pancreatic cancer Brother    Diabetes Maternal Grandmother    Hypertension Maternal Grandmother    Breast cancer Maternal Grandmother    Heart attack Maternal Grandmother    Diabetes Maternal Aunt    Breast cancer Maternal Aunt    Diabetes Maternal Uncle    Hypertension Maternal Uncle    Hypertension Maternal Uncle    Stroke Other    Colon cancer Neg Hx     SOCHx:   reports that she has never smoked. She has never used smokeless tobacco. She reports that she does not drink alcohol and does not use drugs.  ALLERGIES:  Allergies  Allergen Reactions   Lyrica [Pregabalin]     Makes pt "out of it"   Amlodipine Besylate Other (See Comments)    Fatigued; "felt really bad"   Atorvastatin Other (See Comments)    Muscle/joint pain especially hip pain   Ezetimibe-Simvastatin Other (See Comments)    Muscle/joint pain especially hip pain   Losartan  Potassium-Hctz Nausea Only    fatigued   Morphine And Related Other (See Comments)    MIGRAINE   Niacin Other (See Comments)    flushing   Rosuvastatin Other (See Comments)    Muscle/joint pain especially hip pain   Statins     Hip pain   Toprol Xl [Metoprolol Succinate] Other (See Comments)    Makes her feel weak; fatigued   Latex Rash   Valium [Diazepam] Anxiety    Has reverse effect on patient     ROS: Pertinent items noted in HPI and remainder of comprehensive ROS otherwise negative.  HOME MEDS: Current Outpatient Medications on File Prior to Visit  Medication Sig Dispense Refill   ADVAIR HFA 115-21 MCG/ACT inhaler INHALE 2 PUFFS INTO LUNGS TWICE DAILY (Patient taking differently: 2 puffs 2 (two) times daily as needed (respiratory issues.).) 12 Inhaler 0   albuterol (PROVENTIL)  (2.5 MG/3ML) 0.083% nebulizer solution USE ONE VIAL IN NEBULIZER EVERY 6 HOURS AS NEEDED FOR WHEEZING OR SHORTNESS OF BREATH 75 mL 0   albuterol (VENTOLIN HFA) 108 (90 Base) MCG/ACT inhaler INHALE ONE TO TWO PUFFS BY MOUTH EVERY 6 HOURS AS NEEDED FOR WHEEZING OR SHORTNESS OF BREATH (OR PERSISTENT COUGHING) 18 each 2   Alcohol Swabs PADS Use to check blood sugar daily. E11.9 100 each 3   ALPRAZolam (XANAX) 1 MG tablet Take 0.5-1 mg by mouth See admin instructions. Take 1 tablet (1 mg) by mouth (scheduled) at bedtime & may take up to 2 additional tablets if needed for anxiety.     amitriptyline (ELAVIL) 25 MG tablet Take 1 tablet (25 mg total) by mouth at bedtime. 30 tablet 0   blood glucose meter kit and supplies KIT Dispense based on patient and insurance preference. Use once a day.  DX: E11.9. 1 each 0   blood glucose meter kit and supplies Dispense based on patient and insurance preference. Use up to four times daily as directed. (FOR ICD-10 E11.9). 1 each 0   Blood Glucose Monitoring Suppl (FREESTYLE LITE) DEVI      Carboxymethylcellulose Sodium (THERATEARS) 0.25 % SOLN Place 1-2 drops into both eyes 3 (three) times daily as needed (dry/irritated eyes.).     EPINEPHrine (EPIPEN 2-PAK) 0.3 mg/0.3 mL IJ SOAJ injection Inject 0.3 mLs (0.3 mg total) into the muscle once. 1 Device 1   esomeprazole (NEXIUM) 40 MG capsule TAKE ONE CAPSULE BY MOUTH EVERY DAY BEFORE BREAKFAST 90 capsule 0   estradiol (VIVELLE-DOT) 0.075 MG/24HR Place 1 patch onto the skin 2 (two) times a week. Typically Tuesdays & Thursdays     FLUoxetine (PROZAC) 40 MG capsule Take 80 mg by mouth every evening.     fluticasone (FLONASE) 50 MCG/ACT nasal spray Place 2 sprays into both nostrils daily as needed for allergies.     Garlic (GARLIQUE) 347 MG TBEC Take 400 mg by mouth in the morning and at bedtime.     glucose blood test strip Test blood sugar daily.  DX: E11.9 100 each 2   glucose monitoring kit (FREESTYLE) monitoring kit Test  blood sugar daily. DX: E11.9 1 each 0   HYDROcodone-acetaminophen (NORCO) 10-325 MG per tablet Take 1 tablet by mouth 4 (four) times daily as needed for moderate pain or severe pain.     Lancet Devices (LANCING DEVICE) MISC Use to test blood sugar daily. E11.9 1 each 0   Lancets MISC Test blood sugar daily. DX: E11.9 100 each 2   magnesium oxide (MAG-OX) 400 MG tablet Take  400 mg by mouth daily.     meclizine (ANTIVERT) 25 MG tablet Take 25 mg by mouth 3 (three) times daily as needed for dizziness or nausea.     medroxyPROGESTERone (PROVERA) 2.5 MG tablet TAKE ONE TABLET BY MOUTH ONCE DAILY (Patient taking differently: Take 2.5 mg by mouth See admin instructions. Take 1 tablet (2.5 mg) by mouth in the evening for 3 weeks, then off 1 week cyclically) 90 tablet 0   naloxone (NARCAN) 0.4 MG/ML injection Inject 0.4 mg into the vein as needed (opiod overdose).     ondansetron (ZOFRAN) 8 MG tablet Take 1 tablet (8 mg total) by mouth every 8 (eight) hours as needed for nausea. 24 tablet 1   OZEMPIC, 0.25 OR 0.5 MG/DOSE, 2 MG/1.5ML SOPN Inject 0.5 mg into the skin every Wednesday.     polyethylene glycol (MIRALAX / GLYCOLAX) 17 g packet Take 17 g by mouth 2 (two) times a week.     promethazine (PHENERGAN) 12.5 MG tablet TAKE ONE TABLET BY MOUTH EVERY 8 HOURS AS NEEDED. NEED CHECK UP/LABS FOR ADDITIONAL REFILLS 30 tablet 0   tiZANidine (ZANAFLEX) 4 MG tablet Take 4 mg by mouth every 6 (six) hours as needed for muscle spasms.     levocetirizine (XYZAL) 5 MG tablet Take 5 mg by mouth every evening. (Patient not taking: Reported on 07/04/2021)     No current facility-administered medications on file prior to visit.    LABS/IMAGING: No results found for this or any previous visit (from the past 48 hour(s)). No results found.  LIPID PANEL:    Component Value Date/Time   CHOL 224 (H) 05/26/2020 1117   TRIG 103 05/26/2020 1117   HDL 53 05/26/2020 1117   CHOLHDL 4.2 05/26/2020 1117   VLDL 21 05/26/2020  1117   LDLCALC 150 (H) 05/26/2020 1117   LDLDIRECT 133 (H) 05/09/2012 1414     WEIGHTS: Wt Readings from Last 3 Encounters:  07/04/21 147 lb 12.8 oz (67 kg)  06/28/21 145 lb (65.8 kg)  06/06/21 145 lb 9.6 oz (66 kg)    VITALS: BP 126/78    Pulse 72    Ht 5' 4"  (1.626 m)    Wt 147 lb 12.8 oz (67 kg)    LMP 03/15/2014    SpO2 97%    BMI 25.37 kg/m   EXAM: General appearance: alert and no distress Lungs: clear to auscultation bilaterally Heart: regular rate and rhythm, S1, S2 normal, no murmur, click, rub or gallop Abdomen: soft, non-tender; bowel sounds normal; no masses,  no organomegaly Extremities: extremities normal, atraumatic, no cyanosis or edema Skin: Skin color, texture, turgor normal. No rashes or lesions Psych: Pleasant  EKG: Deferred  ASSESSMENT: Acceptable risk for upcoming cataract surgery Hypertension Dyslipidemia  PLAN: 1.   Dana Romero is at acceptable risk for upcoming cataract surgery.  She is not on any blood thinners that need to be stopped prior to that.  She is only taking her carvedilol once a day.  I would advise switching to Coreg 40 mg XR daily and taking it in the morning.  We will also discontinue hydralazine since she has been noncompliant with it.  She already had scheduled recall with Dana Romero in 6 months and I would keep that.  Pixie Casino, MD, [facility], [place] Director of the Advanced Lipid Disorders &  Cardiovascular Risk Reduction Clinic Diplomate of the American Board of Clinical Lipidology Attending Cardiologist  Direct  Dial: [phone]   Fax: [phone]  Website:  [email or web address] 07/04/2021, 1:16 PM

## 2021-07-05 ENCOUNTER — Encounter (HOSPITAL_COMMUNITY): Payer: Self-pay | Admitting: Internal Medicine

## 2021-07-19 ENCOUNTER — Telehealth: Payer: Self-pay

## 2021-07-19 NOTE — Telephone Encounter (Signed)
Shoes Ordered - Ortho[employer] Francis Grey 844 8.44M ?

## 2021-08-09 ENCOUNTER — Ambulatory Visit (INDEPENDENT_AMBULATORY_CARE_PROVIDER_SITE_OTHER): Payer: Medicare HMO

## 2021-08-09 DIAGNOSIS — M2042 Other hammer toe(s) (acquired), left foot: Secondary | ICD-10-CM

## 2021-08-09 DIAGNOSIS — E1140 Type 2 diabetes mellitus with diabetic neuropathy, unspecified: Secondary | ICD-10-CM

## 2021-08-09 DIAGNOSIS — M2142 Flat foot [pes planus] (acquired), left foot: Secondary | ICD-10-CM

## 2021-08-09 DIAGNOSIS — M2141 Flat foot [pes planus] (acquired), right foot: Secondary | ICD-10-CM | POA: Diagnosis not present

## 2021-08-09 NOTE — Progress Notes (Signed)
SITUATION ?Reason for Visit: Fitting of Diabetic Shoes & Insoles ?Patient / Caregiver Report:  Patient is satisfied with fit and function of shoes and insoles. ? ?OBJECTIVE DATA: ?Patient History / Diagnosis:   ?  ICD-10-CM   ?1. Type 2 diabetes mellitus with diabetic neuropathy, unspecified whether long term insulin use (HCC)  E11.40   ?  ?2. Hammertoe of left foot  M20.42   ?  ?3. Pes planus of both feet  M21.41   ? M21.42   ?  ? ? ?Change in Status:   None ? ?ACTIONS PERFORMED: ?In-Person Delivery, patient was fit with: ?- 1x pair [id number] PDAC approved prefabricated Diabetic Shoes: [employer] Francis Grey 844 8.15M ?- 3x pair [phone] PDAC approved vacuum formed custom diabetic insoles; [employer]: [id number] ? ?Shoes and insoles were verified for structural integrity and safety. Patient wore shoes and insoles in office. Skin was inspected and free of areas of concern after wearing shoes and inserts. Shoes and inserts fit properly. Patient / Caregiver provided with ferbal instruction and demonstration regarding donning, doffing, wear, care, proper fit, function, purpose, cleaning, and use of shoes and insoles ' and in all related precautions and risks and benefits regarding shoes and insoles. Patient / Caregiver was instructed to wear properly fitting socks with shoes at all times. Patient was also provided with verbal instruction regarding how to report any failures or malfunctions of shoes or inserts, and necessary follow up care. Patient / Caregiver was also instructed to contact physician regarding change in status that may affect function of shoes and inserts.  ? ?Patient / Caregiver verbalized undersatnding of instruction provided. Patient / Caregiver demonstrated independence with proper donning and doffing of shoes and inserts. ? ?PLAN ?Patient to follow with treating physician as recommended. Plan of care was discussed with and agreed upon by patient and/or caregiver. All questions were answered and concerns  addressed. ? ?

## 2021-11-10 ENCOUNTER — Emergency Department (HOSPITAL_COMMUNITY)
Admission: EM | Admit: 2021-11-10 | Discharge: 2021-11-10 | Disposition: A | Discharge status: Home / Self Care | Payer: Medicare Other | Attending: Emergency Medicine | Admitting: Emergency Medicine

## 2021-11-10 ENCOUNTER — Emergency Department (HOSPITAL_COMMUNITY): Payer: Medicare Other

## 2021-11-10 ENCOUNTER — Other Ambulatory Visit: Payer: Self-pay

## 2021-11-10 ENCOUNTER — Encounter (HOSPITAL_COMMUNITY): Payer: Self-pay

## 2021-11-10 DIAGNOSIS — I129 Hypertensive chronic kidney disease with stage 1 through stage 4 chronic kidney disease, or unspecified chronic kidney disease: Secondary | ICD-10-CM | POA: Diagnosis not present

## 2021-11-10 DIAGNOSIS — E119 Type 2 diabetes mellitus without complications: Secondary | ICD-10-CM | POA: Insufficient documentation

## 2021-11-10 DIAGNOSIS — R197 Diarrhea, unspecified: Secondary | ICD-10-CM | POA: Diagnosis not present

## 2021-11-10 DIAGNOSIS — Z91040 Latex allergy status: Secondary | ICD-10-CM | POA: Insufficient documentation

## 2021-11-10 DIAGNOSIS — R1084 Generalized abdominal pain: Secondary | ICD-10-CM | POA: Insufficient documentation

## 2021-11-10 DIAGNOSIS — N189 Chronic kidney disease, unspecified: Secondary | ICD-10-CM | POA: Diagnosis not present

## 2021-11-10 DIAGNOSIS — R112 Nausea with vomiting, unspecified: Secondary | ICD-10-CM | POA: Diagnosis present

## 2021-11-10 DIAGNOSIS — Z79899 Other long term (current) drug therapy: Secondary | ICD-10-CM | POA: Insufficient documentation

## 2021-11-10 DIAGNOSIS — R110 Nausea: Secondary | ICD-10-CM

## 2021-11-10 DIAGNOSIS — Z794 Long term (current) use of insulin: Secondary | ICD-10-CM | POA: Diagnosis not present

## 2021-11-10 LAB — URINALYSIS, ROUTINE W REFLEX MICROSCOPIC
Bilirubin Urine: NEGATIVE
Glucose, UA: NEGATIVE mg/dL
Hgb urine dipstick: NEGATIVE
Ketones, ur: NEGATIVE mg/dL
Nitrite: NEGATIVE
Protein, ur: NEGATIVE mg/dL
Specific Gravity, Urine: 1.031 — ABNORMAL HIGH (ref 1.005–1.030)
pH: 5 (ref 5.0–8.0)

## 2021-11-10 LAB — CBC
HCT: 40.1 % (ref 36.0–46.0)
Hemoglobin: 12.7 g/dL (ref 12.0–15.0)
MCH: 26.9 pg (ref 26.0–34.0)
MCHC: 31.7 g/dL (ref 30.0–36.0)
MCV: 85 fL (ref 80.0–100.0)
Platelets: 271 10*3/uL (ref 150–400)
RBC: 4.72 MIL/uL (ref 3.87–5.11)
RDW: 14.6 % (ref 11.5–15.5)
WBC: 12.5 10*3/uL — ABNORMAL HIGH (ref 4.0–10.5)
nRBC: 0 % (ref 0.0–0.2)

## 2021-11-10 LAB — COMPREHENSIVE METABOLIC PANEL
ALT: 33 U/L (ref 0–44)
AST: 40 U/L (ref 15–41)
Albumin: 3.7 g/dL (ref 3.5–5.0)
Alkaline Phosphatase: 64 U/L (ref 38–126)
Anion gap: 11 (ref 5–15)
BUN: 22 mg/dL (ref 8–23)
CO2: 22 mmol/L (ref 22–32)
Calcium: 8.9 mg/dL (ref 8.9–10.3)
Chloride: 104 mmol/L (ref 98–111)
Creatinine, Ser: 1.53 mg/dL — ABNORMAL HIGH (ref 0.44–1.00)
GFR, Estimated: 36 mL/min — ABNORMAL LOW (ref >60)
Glucose, Bld: 152 mg/dL — ABNORMAL HIGH (ref 70–99)
Potassium: 4.1 mmol/L (ref 3.5–5.1)
Sodium: 137 mmol/L (ref 135–145)
Total Bilirubin: 0.4 mg/dL (ref 0.3–1.2)
Total Protein: 7.1 g/dL (ref 6.5–8.1)

## 2021-11-10 LAB — CBG MONITORING, ED: Glucose-Capillary: 144 mg/dL — ABNORMAL HIGH (ref 70–99)

## 2021-11-10 LAB — LIPASE, BLOOD: Lipase: 32 U/L (ref 11–51)

## 2021-11-10 MED ORDER — METOCLOPRAMIDE HCL 5 MG/ML IJ SOLN
10.0000 mg | Freq: Once | INTRAMUSCULAR | Status: AC
Start: 2021-11-10 — End: 2021-11-10
  Administered 2021-11-10: 10 mg via INTRAVENOUS
  Filled 2021-11-10: qty 2

## 2021-11-10 MED ORDER — HYDROMORPHONE HCL 1 MG/ML IJ SOLN
0.5000 mg | Freq: Once | INTRAMUSCULAR | Status: AC
  Administered 2021-11-10: 0.5 mg via INTRAVENOUS
  Filled 2021-11-10: qty 0.5

## 2021-11-10 MED ORDER — ONDANSETRON 4 MG PO TBDP
4.0000 mg | ORAL_TABLET | Freq: Once | ORAL | Status: AC
  Administered 2021-11-10: 4 mg via ORAL
  Filled 2021-11-10: qty 1

## 2021-11-10 MED ORDER — SODIUM CHLORIDE 0.9 % IV BOLUS
500.0000 mL | Freq: Once | INTRAVENOUS | Status: AC
  Administered 2021-11-10: 500 mL via INTRAVENOUS

## 2021-11-10 MED ORDER — PROMETHAZINE HCL 12.5 MG PO TABS: 25.0000 mg | ORAL_TABLET | Freq: Three times a day (TID) | ORAL | 0 refills | Status: DC | PRN

## 2021-11-10 MED ORDER — ONDANSETRON HCL 4 MG/2ML IJ SOLN: 4.0000 mg | Freq: Once | INTRAMUSCULAR | Status: DC | PRN

## 2021-11-10 MED ORDER — IOHEXOL 300 MG/ML  SOLN
75.0000 mL | Freq: Once | INTRAMUSCULAR | Status: AC | PRN
  Administered 2021-11-10: 75 mL via INTRAVENOUS

## 2021-11-10 NOTE — ED Provider Notes (Signed)
[facility] EMERGENCY DEPARTMENT Provider Note   CSN: [id number] Arrival date & time: 11/10/21  1212     History  Chief Complaint  Patient presents with   Nausea    Dana Romero is a 71 y.o. female.  HPI      Dana Romero is a 71 y.o. female with past medical history of type 2 diabetes, hypertension, chronic kidney disease, esophageal reflux and chronic abdominal pain who presents to the Emergency Department complaining of worsening abdominal pain nausea and vomiting.  She states that she has chronic abdominal pain and recurrent nausea vomiting, but symptoms have worsened for several days.  Had a EGD performed in February and she is unclear of the findings.  Has meclizine, Phenergan and Zofran but has not taken her medications today.  States that she has vomited 3 times today and vomitus appears dark in color.  No obvious coffee-ground emesis or hematemesis.  Some loose stools but denies any melena or hematochezia.    Home Medications Prior to Admission medications   Medication Sig Start Date End Date Taking? Authorizing Provider  ADVAIR HFA 115-21 MCG/ACT inhaler INHALE 2 PUFFS INTO LUNGS TWICE DAILY Patient taking differently: 2 puffs 2 (two) times daily as needed (respiratory issues.). 04/05/17   Wardell Honour, MD  albuterol (PROVENTIL) (2.5 MG/3ML) 0.083% nebulizer solution USE ONE VIAL IN NEBULIZER EVERY 6 HOURS AS NEEDED FOR WHEEZING OR SHORTNESS OF BREATH 09/25/15   Wardell Honour, MD  albuterol (VENTOLIN HFA) 108 (90 Base) MCG/ACT inhaler INHALE ONE TO TWO PUFFS BY MOUTH EVERY 6 HOURS AS NEEDED FOR WHEEZING OR SHORTNESS OF BREATH (OR PERSISTENT COUGHING) 01/18/16   Wardell Honour, MD  Alcohol Swabs PADS Use to check blood sugar daily. E11.9 04/07/15   Wardell Honour, MD  ALPRAZolam Duanne Moron) 1 MG tablet Take 0.5-1 mg by mouth See admin instructions. Take 1 tablet (1 mg) by mouth (scheduled) at bedtime & may take up to 2 additional tablets if needed for anxiety.  02/28/21   [provider]  amitriptyline (ELAVIL) 25 MG tablet Take 1 tablet (25 mg total) by mouth at bedtime. 12/15/15   Wardell Honour, MD  blood glucose meter kit and supplies KIT Dispense based on patient and insurance preference. Use once a day.  DX: E11.9. 12/31/14   Wardell Honour, MD  blood glucose meter kit and supplies Dispense based on patient and insurance preference. Use up to four times daily as directed. (FOR ICD-10 E11.9). 03/31/15   Wardell Honour, MD  Blood Glucose Monitoring Suppl (FREESTYLE LITE) DEVI  01/12/15   [provider]  Carboxymethylcellulose Sodium (THERATEARS) 0.25 % SOLN Place 1-2 drops into both eyes 3 (three) times daily as needed (dry/irritated eyes.).    [provider]  carvedilol (COREG CR) 40 MG 24 hr capsule Take 1 capsule (40 mg total) by mouth daily. 07/04/21   Hilty, Nadean Corwin, MD  EPINEPHrine (EPIPEN 2-PAK) 0.3 mg/0.3 mL IJ SOAJ injection Inject 0.3 mLs (0.3 mg total) into the muscle once. 01/07/15   Wardell Honour, MD  esomeprazole (NEXIUM) 40 MG capsule TAKE ONE CAPSULE BY MOUTH EVERY DAY BEFORE BREAKFAST 04/25/17   Wardell Honour, MD  estradiol (VIVELLE-DOT) 0.075 MG/24HR Place 1 patch onto the skin 2 (two) times a week. Typically Tuesdays & Thursdays 06/03/21   [provider]  FLUoxetine (PROZAC) 40 MG capsule Take 80 mg by mouth every evening.    [provider]  fluticasone Asencion Islam)  50 MCG/ACT nasal spray Place 2 sprays into both nostrils daily as needed for allergies.    [provider]  Garlic (GARLIQUE) 563 MG TBEC Take 400 mg by mouth in the morning and at bedtime.    [provider]  glucose blood test strip Test blood sugar daily.  DX: E11.9 04/06/15   Wardell Honour, MD  glucose monitoring kit (FREESTYLE) monitoring kit Test blood sugar daily. DX: E11.9 01/12/15   Wardell Honour, MD  HYDROcodone-acetaminophen [facility]) 10-325 MG per tablet Take 1 tablet by mouth 4 (four) times daily  as needed for moderate pain or severe pain.    [provider]  Lancet Devices (LANCING DEVICE) MISC Use to test blood sugar daily. E11.9 04/07/15   Wardell Honour, MD  Lancets MISC Test blood sugar daily. DX: E11.9 04/06/15   Wardell Honour, MD  levocetirizine (XYZAL) 5 MG tablet Take 5 mg by mouth every evening. Patient not taking: Reported on 07/04/2021    [provider]  magnesium oxide (MAG-OX) 400 MG tablet Take 400 mg by mouth daily.    [provider]  meclizine (ANTIVERT) 25 MG tablet Take 25 mg by mouth 3 (three) times daily as needed for dizziness or nausea. 06/28/20   [provider]  medroxyPROGESTERone (PROVERA) 2.5 MG tablet TAKE ONE TABLET BY MOUTH ONCE DAILY Patient taking differently: Take 2.5 mg by mouth See admin instructions. Take 1 tablet (2.5 mg) by mouth in the evening for 3 weeks, then off 1 week cyclically 12/15/35   Wardell Honour, MD  naloxone [facility]) 0.4 MG/ML injection Inject 0.4 mg into the vein as needed (opiod overdose).    [provider]  ondansetron (ZOFRAN) 8 MG tablet Take 1 tablet (8 mg total) by mouth every 8 (eight) hours as needed for nausea. 01/22/20   Florian Buff, MD  OZEMPIC, 0.25 OR 0.5 MG/DOSE, 2 MG/1.5ML SOPN Inject 0.5 mg into the skin every Wednesday. 05/10/21   [provider]  polyethylene glycol (MIRALAX / GLYCOLAX) 17 g packet Take 17 g by mouth 2 (two) times a week.    [provider]  promethazine (PHENERGAN) 12.5 MG tablet TAKE ONE TABLET BY MOUTH EVERY 8 HOURS AS NEEDED. NEED CHECK UP/LABS FOR ADDITIONAL REFILLS 11/04/15   Wardell Honour, MD  tiZANidine (ZANAFLEX) 4 MG tablet Take 4 mg by mouth every 6 (six) hours as needed for muscle spasms.    [provider]      Allergies    Lyrica [pregabalin], Amlodipine besylate, Atorvastatin, Ezetimibe-simvastatin, Losartan potassium-hctz, Morphine and related, Niacin, Rosuvastatin, Statins, Toprol xl [metoprolol succinate],  Latex, and Valium [diazepam]    Review of Systems   Review of Systems  Constitutional:  Positive for appetite change. Negative for chills and fever.  HENT:  Negative for sore throat and trouble swallowing.   Respiratory:  Negative for cough and shortness of breath.   Cardiovascular:  Negative for chest pain.  Gastrointestinal:  Positive for abdominal pain, diarrhea, nausea and vomiting. Negative for blood in stool.  Genitourinary:  Negative for dysuria and flank pain.    Physical Exam Updated Vital Signs BP 137/80 (BP Location: Right Arm)   Pulse 72   Temp 97.8 F (36.6 C) (Oral)   Resp 15   Ht _0  (1.626 m)   Wt 65.8 kg   LMP 03/15/2014   SpO2 96%   BMI 24.89 kg/m  Physical Exam Vitals and nursing note reviewed.  Constitutional:  General: She is not in acute distress.    Appearance: Normal appearance. She is not ill-appearing or toxic-appearing.  HENT:     Mouth/Throat:     Mouth: Mucous membranes are moist.  Cardiovascular:     Rate and Rhythm: Normal rate and regular rhythm.     Pulses: Normal pulses.  Pulmonary:     Effort: Pulmonary effort is normal.     Breath sounds: Normal breath sounds.  Chest:     Chest wall: No tenderness.  Abdominal:     General: There is no distension.     Palpations: There is no mass.     Tenderness: There is abdominal tenderness. There is no right CVA tenderness or left CVA tenderness.     Comments: Mild tenderness to the epigastric and periumbilical region.  No guarding or rebound tenderness.  Abdomen is soft no distention.  Musculoskeletal:        General: Normal range of motion.     Right lower leg: No edema.     Left lower leg: No edema.  Skin:    General: Skin is warm.     Capillary Refill: Capillary refill takes less than 2 seconds.  Neurological:     General: No focal deficit present.     Mental Status: She is alert.     Sensory: No sensory deficit.     Motor: No weakness.     ED Results / Procedures /  Treatments   Labs (all labs ordered are listed, but only abnormal results are displayed) Labs Reviewed  COMPREHENSIVE METABOLIC PANEL - Abnormal; Notable for the following components:      Result Value   Glucose, Bld 152 (*)    Creatinine, Ser 1.53 (*)    GFR, Estimated 36 (*)    All other components within normal limits  CBC - Abnormal; Notable for the following components:   WBC 12.5 (*)    All other components within normal limits  URINALYSIS, ROUTINE W REFLEX MICROSCOPIC - Abnormal; Notable for the following components:   APPearance CLOUDY (*)    Specific Gravity, Urine 1.031 (*)    Leukocytes,Ua TRACE (*)    Bacteria, UA MANY (*)    All other components within normal limits  CBG MONITORING, ED - Abnormal; Notable for the following components:   Glucose-Capillary 144 (*)    All other components within normal limits  LIPASE, BLOOD    EKG EKG Interpretation  Date/Time:  Thursday November 10 2021 13:34:24 EDT Ventricular Rate:  71 PR Interval:  170 QRS Duration: 90 QT Interval:  457 QTC Calculation: 497 R Axis:   -31 Text Interpretation: Sinus rhythm Left axis deviation Anterior infarct, old Baseline wander in lead(s) V5 No significant change since last tracing Confirmed by Wandra Arthurs [phone]) on 11/11/2021 11:45:08 AM  Radiology CT ABDOMEN PELVIS W CONTRAST  Result Date: 11/10/2021 CLINICAL DATA:  Nausea/vomiting Abdominal pain, acute, nonlocalized EXAM: CT ABDOMEN AND PELVIS WITH CONTRAST TECHNIQUE: Multidetector CT imaging of the abdomen and pelvis was performed using the standard protocol following bolus administration of intravenous contrast. RADIATION DOSE REDUCTION: This exam was performed according to the departmental dose-optimization program which includes automated exposure control, adjustment of the mA and/or kV according to patient size and/or use of iterative reconstruction technique. CONTRAST:  89m OMNIPAQUE IOHEXOL 300 MG/ML  SOLN COMPARISON:  September 2021  FINDINGS: Lower chest: Similar appearance of moderate size hiatal hernia with partially intrathoracic stomach. Hepatobiliary: No focal liver abnormality is seen. Status post cholecystectomy.  No biliary dilatation. Pancreas: Atrophy.  Otherwise unremarkable. Spleen: Unremarkable. Adrenals/Urinary Tract: Adrenals, kidneys, and bladder are unremarkable. Stomach/Bowel: Stomach is rotated but not distended. Bowel is normal in caliber. Vascular/Lymphatic: Mild atherosclerosis.  No enlarged nodes. Reproductive: Uterus and bilateral adnexa are unremarkable. Unchanged soft tissue fullness at the vagina previously evaluated by MRI. Stability suggests absence of malignancy. Other: No free fluid.  Abdominal wall is unremarkable. Musculoskeletal: Degenerative changes of the spine IMPRESSION: No acute abnormality. There is a persistent moderate size hiatal hernia with rotated but nondilated partially intrathoracic stomach. Electronically Signed   By: Macy Mis M.D.   On: 11/10/2021 15:21     Procedures Procedures    Medications Ordered in ED Medications  ondansetron (ZOFRAN-ODT) disintegrating tablet 4 mg (4 mg Oral Given 11/10/21 1302)    ED Course/ Medical Decision Making/ A&P                           Medical Decision Making Patient here with recurrent nausea vomiting and loose stools.  Symptoms associated with periumbilical and epigastric pain.  Has history of type 2 diabetes, takes Ozempic daily also has chronic abdominal pain.  Had EGD done by Dr. Abbey Chatters in February patient unclear of findings.  3 episodes of vomiting this morning.  On exam, patient nontoxic-appearing, vital signs reassuring.  Mucous membranes are moist.  She does have some tenderness of her upper and mid abdomen without guarding or rebound.  No peritoneal signs.  No active vomiting here.  Differential diagnosis would include acute on chronic abdominal pain, gastritis, diverticulitis, viral process  Amount and/or Complexity of Data  Reviewed Labs: ordered.    Details: labs are w/o significant leukocytosis, creatinine elvated, but near baseline. lipase unremarkable.  urine with many bacteria and sqamous cells.  likely contaminent.  pt w/o dysuria. Radiology: ordered.    Details: CT abd/pelvis w/o acute abnormality Discussion of management or test interpretation with external provider(s): Pt reports feeling better and requesting to go home. Sx's likely acute on chronic abdominal pain and recurrent nausea.  She is agreeable to close out pt f/u.  Return precautions discussed   Risk Prescription drug management.           Final Clinical Impression(s) / ED Diagnoses Final diagnoses:  Nausea  Generalized abdominal pain    Rx / DC Orders ED Discharge Orders     None         Kem Parkinson, PA-C 11/12/21 1718    Milton Ferguson, MD 11/18/21 [phone]

## 2021-11-10 NOTE — ED Triage Notes (Addendum)
Pt c/o abd pain, nausea, vomiting x 1 year. Pt has received regular follow up and has had a recent EGD, but pt nor family knows the diagnosis. Pt has medication to control symptoms at home, but has not taken them today.

## 2021-11-10 NOTE — Discharge Instructions (Signed)
Bland diet as tolerated.  Frequent sips of clear fluids.  Continue to take your Nexium daily as directed.  I have refilled a short course of your Phenergan to take as needed for nausea vomiting.  This medication may cause drowsiness so do not operate machinery or drive while taking this medication.  Please call the GI provider listed, Dr. Queen Blossom office to arrange a follow-up appointment.  Also, please follow-up with your primary care provider for recheck regarding your kidney function test

## 2021-11-18 ENCOUNTER — Encounter (HOSPITAL_COMMUNITY)
Admission: RE | Admit: 2021-11-18 | Discharge: 2021-11-18 | Disposition: A | Discharge status: Home / Self Care | Payer: Medicare Other | Source: Ambulatory Visit | Attending: Ophthalmology | Admitting: Ophthalmology

## 2021-11-22 NOTE — H&P (Signed)
Surgical History & Physical  Patient Name: Dana Romero DOB: [birth year]  Surgery: Cataract extraction with intraocular lens implant phacoemulsification; Left Eye  Surgeon: Fabio Pierce MD Surgery Date:  11-25-21 Pre-Op Date:  11-21-21  HPI: A 32 Yr. old female patient is referred by [name] in [facility] for cataract eval. 1. 1. The patient complains of difficulty when recognizing people, which began 1 year ago. Both eyes are affected. The episode is gradual. The condition's severity increased since last visit. Symptoms occur when the patient is inside, outside and reading. The complaint is associated with glare. This is negatively affecting the patient's quality of life and the patient is unable to function adequately in life with the current level of vision. HPI was performed by Fabio Pierce .  Medical History: Dry Eyes Cataracts Anxiety Diabetes - DM Type 2 High Blood Pressure  Review of Systems Negative Allergic/Immunologic Negative Cardiovascular Negative Constitutional Negative Ear, Nose, Mouth & Throat Negative Endocrine Negative Eyes Negative Gastrointestinal Negative Genitourinary Negative Hemotologic/Lymphatic Negative Integumentary Negative Musculoskeletal Negative Neurological Negative Psychiatry Negative Respiratory  Social   Never Smoked  Medication Ocipic shot, BP pill, Xanax,   Sx/Procedures Appendectomy, Gallbladder Removal, Breast cyst removals,   Drug Allergies   NKDA  History & Physical: Heent: Cataract, left eye NECK: supple without bruits LUNGS: lungs clear to auscultation CV: regular rate and rhythm Abdomen: soft and non-tender  Impression & Plan: Assessment: 1.  NUCLEAR SCLEROSIS AGE RELATED; Both Eyes (H25.13) 2.  Diabetes Type 2 No retinopathy (E11.9) 3.  DERMATOCHALASIS, no surgery; Right Upper Lid, Left Upper Lid (H02.831, H02.834) 4.  BLEPHARITIS; Right Upper Lid, Right Lower Lid, Left Upper Lid, Left Lower Lid (H01.001,  H01.002,H01.004,H01.005) 5.  Pinguecula; Both Eyes (H11.153) 6.  DRY EYE SYNDROME/TEAR FILM INSUFFICIENCY; Both Eyes (H04.123) 7.  ASTIGMATISM, REGULAR; Both Eyes (H52.223)  Plan: 1.  Cataract accounts for the patient's decreased vision. This visual impairment is not correctable with a tolerable change in glasses or contact lenses. Cataract surgery with an implantation of a new lens should significantly improve the visual and functional status of the patient. Discussed all risks, benefits, alternatives, and potential complications. Discussed the procedures and recovery. Patient desires to have surgery. A-scan ordered and performed today for intra-ocular lens calculations. The surgery will be performed in order to improve vision for driving, reading, and for eye examinations. Recommend phacoemulsification with intra-ocular lens. Recommend Dextenza for post-operative pain and inflammation. Left Eye worse - first. Dilates well - shugaraine by protocol. Declines toric IOL.  2.  Stressed importance of blood sugar and blood pressure control, and also yearly eye examinations. Discussed the need for ongoing proactive ocular exams and treatment, hopefully before visual symptoms develop.  3.  Asymptomatic, recommend observation for now. Findings, prognosis and treatment options reviewed.  4.  Recommend regular lid cleaning.  5.  Observe; Artificial tears as needed for irritation.  6.  Severe. Start PF tears 1 drop both eyes at least 4x/day.  7.  Declines Toric IOL OS (only)

## 2021-11-22 NOTE — Progress Notes (Unsigned)
Referring Provider: Percell Belt, DO Primary Care Physician:  Percell Belt, DO Primary GI Physician: Dr. Abbey Chatters  No chief complaint on file.   HPI:   Dana Romero is a 71 y.o. female presenting today for follow-up of abdominal pain, nausea, and vomiting.   We last saw patient in December 2022.  At that time, she reported persistent nausea with significant vomiting, associated substantial weight loss since July 2021.  Dropped from 200 pounds down to 128 pounds at her lowest, but weight has been fairly stable and she actually gained about 15 pounds back.  Reported symptom onset started at the time of losing her twin brother to pancreatic cancer.  Also lost her mother 3 years prior to stomach cancer.  Reported hemoglobin A1c greater than 10.  Also with terrible heartburn previously off medication for quite some time, but symptoms doing well on Nexium 40 mg daily.  Reported frequent upper abdominal pain without known trigger.  Continue with intermittent nausea but somewhat improved with lower dose of Ozempic, sometimes improved by laying down.  Vomiting less frequent.  Also reported solid food dysphagia.  Chronic constipation with associated bloating, taking 2 Senokot at bedtime having bowel movements every 3 to 4 days.  No prior colonoscopy or EGD and reported nephrologist advised against bowel prep for colonoscopy and she planned for Cologuard.  Noted recent labs with mildly elevated AST. Also reported vertigo.  Had been referred to neurology due to abnormal MRI, but missed her appointment. She had prior CT in March 2022 with moderate HH, soft tissue fullness of the vagina and surrounding structures poorly characterized by CT. suspected upper GI symptoms/weight loss likely multifactorial, likely influenced by [facility], complicated GERD, unable to rule out PUD or malignancy, possible functional overlay in the setting of anxiety/depression, and unable to rule out neurologic component contributing to  nausea.  Plan to proceed with EGD, continue Nexium, follow through with Cologuard, encourage follow-up with neurology and gynecology, use MiraLAX daily for constipation, continue to monitor LFTs.   EGD 06/30/2021: Moderate size hiatal hernia, moderate Schatzki's ring dilated, gastritis biopsied, normal examined duodenum.  Pathology with gastric antral and oxyntic mucosa with nonspecific reactive gastropathy, negative for H. pylori.  Patient was seen in the emergency room on 11/10/2021 for nausea and generalized abdominal pain.  CBC with mild leukocytosis of 12.5, CMP without acute abnormalities, LFTs normal, glucose elevated at 152, creatinine fairly stable at 1.53, lipase normal.  CT A/P with no acute abnormalities.  Persist persistent moderate size hiatal hernia with rotated but nondilated partially intrathoracic stomach.  She was given Zofran, Reglan, hydromorphone in the ED with clinical improvement and requested to go home.  Today:     Past Medical History:  Diagnosis Date   Angina at rest [facility])    Anxiety    Asthma    Caregiver stress 07/04/2011   Chronic pain syndrome    Degenerative disc disease, cervical    Depression    Essential hypertension 1998   GERD (gastroesophageal reflux disease)    Gout    Hiatal hernia    History of pancreatitis 06/08/2005   History of stroke    September 2020   Pelvic mass    Renal insufficiency    Seasonal allergies    Sinusitis, chronic    Stroke [facility])    Transfusion history    Type 2 diabetes mellitus ([place]) 1998    Past Surgical History:  Procedure Laterality Date   APPENDECTOMY  ARM DEBRIDEMENT Left    debridement of axilla "spider bite"   BALLOON DILATION N/A 06/30/2021   Procedure: BALLOON DILATION;  Surgeon: Eloise Harman, DO;  Location: [facility];  Service: Endoscopy;  Laterality: N/A;   BIOPSY  06/30/2021   Procedure: BIOPSY;  Surgeon: Eloise Harman, DO;  Location: [facility] END[facility];  Service: Endoscopy;;   BREAST  SURGERY     breast cyst x2 -last '12   CHOLECYSTECTOMY N/A 08/11/2015   Procedure: LAPAROSCOPIC CHOLECYSTECTOMY;  Surgeon: Arta Bruce Kinsinger, MD;  Location: [facility] ORS;  Service: General;  Laterality: N/A;   ESOPHAGOGASTRODUODENOSCOPY (EGD) WITH PROPOFOL N/A 06/30/2021   Procedure: ESOPHAGOGASTRODUODENOSCOPY (EGD) WITH PROPOFOL;  Surgeon: Eloise Harman, DO;  Location: [facility];  Service: Endoscopy;  Laterality: N/A;  7:30am   TONSILLECTOMY     TONSILLECTOMY AND ADENOIDECTOMY     TUBAL LIGATION     post partum    Current Outpatient Medications  Medication Sig Dispense Refill   ADVAIR HFA 115-21 MCG/ACT inhaler INHALE 2 PUFFS INTO LUNGS TWICE DAILY (Patient taking differently: 2 puffs 2 (two) times daily as needed (respiratory issues.).) 12 Inhaler 0   albuterol (PROVENTIL) (2.5 MG/3ML) 0.083% nebulizer solution USE ONE VIAL IN NEBULIZER EVERY 6 HOURS AS NEEDED FOR WHEEZING OR SHORTNESS OF BREATH 75 mL 0   albuterol (VENTOLIN HFA) 108 (90 Base) MCG/ACT inhaler INHALE ONE TO TWO PUFFS BY MOUTH EVERY 6 HOURS AS NEEDED FOR WHEEZING OR SHORTNESS OF BREATH (OR PERSISTENT COUGHING) 18 each 2   Alcohol Swabs PADS Use to check blood sugar daily. E11.9 100 each 3   ALPRAZolam (XANAX) 1 MG tablet Take 0.5-1 mg by mouth See admin instructions. Take 1 tablet (1 mg) by mouth (scheduled) at bedtime & may take up to 2 additional tablets if needed for anxiety.     amitriptyline (ELAVIL) 25 MG tablet Take 1 tablet (25 mg total) by mouth at bedtime. 30 tablet 0   blood glucose meter kit and supplies KIT Dispense based on patient and insurance preference. Use once a day.  DX: E11.9. 1 each 0   blood glucose meter kit and supplies Dispense based on patient and insurance preference. Use up to four times daily as directed. (FOR ICD-10 E11.9). 1 each 0   Blood Glucose Monitoring Suppl (FREESTYLE LITE) DEVI      Carboxymethylcellulose Sodium (THERATEARS) 0.25 % SOLN Place 1-2 drops into both eyes 3 (three) times  daily as needed (dry/irritated eyes.).     carvedilol (COREG CR) 40 MG 24 hr capsule Take 1 capsule (40 mg total) by mouth daily. 90 capsule 3   EPINEPHrine (EPIPEN 2-PAK) 0.3 mg/0.3 mL IJ SOAJ injection Inject 0.3 mLs (0.3 mg total) into the muscle once. 1 Device 1   esomeprazole (NEXIUM) 40 MG capsule TAKE ONE CAPSULE BY MOUTH EVERY DAY BEFORE BREAKFAST 90 capsule 0   estradiol (VIVELLE-DOT) 0.075 MG/24HR Place 1 patch onto the skin 2 (two) times a week. Typically Tuesdays & Thursdays     FLUoxetine (PROZAC) 40 MG capsule Take 80 mg by mouth every evening.     fluticasone (FLONASE) 50 MCG/ACT nasal spray Place 2 sprays into both nostrils daily as needed for allergies.     Garlic (GARLIQUE) 130 MG TBEC Take 400 mg by mouth in the morning and at bedtime.     glucose blood test strip Test blood sugar daily.  DX: E11.9 100 each 2   glucose monitoring kit (FREESTYLE) monitoring kit Test blood sugar daily.  DX: E11.9 1 each 0   HYDROcodone-acetaminophen (NORCO) 10-325 MG per tablet Take 1 tablet by mouth 4 (four) times daily as needed for moderate pain or severe pain.     Lancet Devices (LANCING DEVICE) MISC Use to test blood sugar daily. E11.9 1 each 0   Lancets MISC Test blood sugar daily. DX: E11.9 100 each 2   levocetirizine (XYZAL) 5 MG tablet Take 5 mg by mouth every evening. (Patient not taking: Reported on 07/04/2021)     magnesium oxide (MAG-OX) 400 MG tablet Take 400 mg by mouth daily.     meclizine (ANTIVERT) 25 MG tablet Take 25 mg by mouth 3 (three) times daily as needed for dizziness or nausea.     medroxyPROGESTERone (PROVERA) 2.5 MG tablet TAKE ONE TABLET BY MOUTH ONCE DAILY (Patient taking differently: Take 2.5 mg by mouth See admin instructions. Take 1 tablet (2.5 mg) by mouth in the evening for 3 weeks, then off 1 week cyclically) 90 tablet 0   naloxone (NARCAN) 0.4 MG/ML injection Inject 0.4 mg into the vein as needed (opiod overdose).     ondansetron (ZOFRAN) 8 MG tablet Take 1  tablet (8 mg total) by mouth every 8 (eight) hours as needed for nausea. 24 tablet 1   OZEMPIC, 0.25 OR 0.5 MG/DOSE, 2 MG/1.5ML SOPN Inject 0.5 mg into the skin every Wednesday.     polyethylene glycol (MIRALAX / GLYCOLAX) 17 g packet Take 17 g by mouth 2 (two) times a week.     promethazine (PHENERGAN) 12.5 MG tablet Take 2 tablets (25 mg total) by mouth every 8 (eight) hours as needed for nausea or vomiting. 8 tablet 0   tiZANidine (ZANAFLEX) 4 MG tablet Take 4 mg by mouth every 6 (six) hours as needed for muscle spasms.     No current facility-administered medications for this visit.    Allergies as of 11/23/2021 - Review Complete 11/10/2021  Allergen Reaction Noted   Lyrica [pregabalin]  01/22/2020   Amlodipine besylate Other (See Comments)    Atorvastatin Other (See Comments)    Ezetimibe-simvastatin Other (See Comments)    Losartan potassium-hctz Nausea Only 02/28/2011   Morphine and related Other (See Comments) 08/11/2015   Niacin Other (See Comments) 10/12/2006   Rosuvastatin Other (See Comments)    Statins  05/11/2021   Toprol xl [metoprolol succinate] Other (See Comments) 02/28/2011   Latex Rash 06/06/2011   Valium [diazepam] Anxiety 04/12/2015    Family History  Problem Relation Age of Onset   Hypertension Mother    Depression Mother    Stomach cancer Mother 44   Hypertension Brother    Pancreatic cancer Brother    Diabetes Maternal Grandmother    Hypertension Maternal Grandmother    Breast cancer Maternal Grandmother    Heart attack Maternal Grandmother    Diabetes Maternal Aunt    Breast cancer Maternal Aunt    Diabetes Maternal Uncle    Hypertension Maternal Uncle    Hypertension Maternal Uncle    Stroke Other    Colon cancer Neg Hx     Social History   Socioeconomic History   Marital status: Single    Spouse name: Not on file   Number of children: Not on file   Years of education: Not on file   Highest education level: Not on file  Occupational  History   Occupation: Retired  Tobacco Use   Smoking status: Never   Smokeless tobacco: Never  Vaping Use   Vaping Use: Never used  Substance and Sexual Activity   Alcohol use: No   Drug use: No   Sexual activity: Yes    Birth control/protection: Post-menopausal  Other Topics Concern   Not on file  Social History Narrative   Not on file   Social Determinants of Health   Financial Resource Strain: Not on file  Food Insecurity: Not on file  Transportation Needs: Not on file  Physical Activity: Not on file  Stress: Not on file  Social Connections: Not on file    Review of Systems: Gen: Denies fever, chills, cold or flu like symptoms, pre-syncope, or syncope.  CV: Denies chest pain, palpitations. Resp: Denies dyspnea, cough.  GI: See HPI Heme: See HPI  Physical Exam: LMP 03/15/2014  General:   Alert and oriented. No distress noted. Pleasant and cooperative.  Head:  Normocephalic and atraumatic. Eyes:  Conjuctiva clear without scleral icterus. Heart:  S1, S2 present without murmurs appreciated. Lungs:  Clear to auscultation bilaterally. No wheezes, rales, or rhonchi. No distress.  Abdomen:  +BS, soft, non-tender and non-distended. No rebound or guarding. No HSM or masses noted. Msk:  Symmetrical without gross deformities. Normal posture. Extremities:  Without edema. Neurologic:  Alert and  oriented x4 Psych:  Normal mood and affect.    Assessment:     Plan:  ***   Aliene Altes, PA-C [facility] 11/23/2021

## 2021-11-23 ENCOUNTER — Encounter: Payer: Self-pay | Admitting: Gastroenterology

## 2021-11-23 ENCOUNTER — Ambulatory Visit (INDEPENDENT_AMBULATORY_CARE_PROVIDER_SITE_OTHER): Payer: Medicare Other | Admitting: Gastroenterology

## 2021-11-23 VITALS — BP 155/89 | HR 76 | Temp 98.4°F | Ht 64.0 in | Wt 143.6 lb

## 2021-11-23 DIAGNOSIS — R112 Nausea with vomiting, unspecified: Secondary | ICD-10-CM

## 2021-11-23 DIAGNOSIS — K219 Gastro-esophageal reflux disease without esophagitis: Secondary | ICD-10-CM | POA: Diagnosis not present

## 2021-11-23 DIAGNOSIS — R1310 Dysphagia, unspecified: Secondary | ICD-10-CM | POA: Diagnosis not present

## 2021-11-23 DIAGNOSIS — R933 Abnormal findings on diagnostic imaging of other parts of digestive tract: Secondary | ICD-10-CM

## 2021-11-23 DIAGNOSIS — R1084 Generalized abdominal pain: Secondary | ICD-10-CM | POA: Insufficient documentation

## 2021-11-23 DIAGNOSIS — K5900 Constipation, unspecified: Secondary | ICD-10-CM

## 2021-11-23 DIAGNOSIS — R300 Dysuria: Secondary | ICD-10-CM

## 2021-11-23 NOTE — Patient Instructions (Addendum)
We will arrange for you to have a swallowing study and upper GI series in the near future to evaluate your ongoing swallowing problems and hiatal hernia.  Please have urine testing completed at [facility].  Discuss discontinuing Ozempic with your primary care provider as I feel this is likely contributing to your symptoms of abdominal pain, nausea, and vomiting.   Continue Nexium 40 mg daily.  Continue Zofran as needed.  For constipation: Start MiraLAX 1 capful (17 g) daily in 8 ounces of water.  Please follow-up with your primary care doctor on completing Cologuard.  You need to follow-up with a neurologist on vertigo and prior abnormal MRI.  We will have further recommendations to follow your imaging study results.   Ermalinda Memos, PA-C [facility]

## 2021-11-25 ENCOUNTER — Encounter (HOSPITAL_COMMUNITY): Payer: Self-pay | Admitting: Ophthalmology

## 2021-11-25 ENCOUNTER — Ambulatory Visit (HOSPITAL_COMMUNITY): Payer: Medicare Other | Admitting: Anesthesiology

## 2021-11-25 ENCOUNTER — Encounter (HOSPITAL_COMMUNITY)
Admission: RE | Disposition: A | Discharge status: Home / Self Care | Payer: Self-pay | Source: Home / Self Care | Attending: Ophthalmology

## 2021-11-25 ENCOUNTER — Ambulatory Visit (HOSPITAL_BASED_OUTPATIENT_CLINIC_OR_DEPARTMENT_OTHER): Payer: Medicare Other | Admitting: Anesthesiology

## 2021-11-25 ENCOUNTER — Ambulatory Visit (HOSPITAL_COMMUNITY)
Admission: RE | Admit: 2021-11-25 | Discharge: 2021-11-25 | Disposition: A | Discharge status: Home / Self Care | Payer: Medicare Other | Attending: Ophthalmology | Admitting: Ophthalmology

## 2021-11-25 DIAGNOSIS — H52223 Regular astigmatism, bilateral: Secondary | ICD-10-CM | POA: Diagnosis not present

## 2021-11-25 DIAGNOSIS — E119 Type 2 diabetes mellitus without complications: Secondary | ICD-10-CM

## 2021-11-25 DIAGNOSIS — Z7984 Long term (current) use of oral hypoglycemic drugs: Secondary | ICD-10-CM | POA: Diagnosis not present

## 2021-11-25 DIAGNOSIS — I10 Essential (primary) hypertension: Secondary | ICD-10-CM | POA: Insufficient documentation

## 2021-11-25 DIAGNOSIS — H269 Unspecified cataract: Secondary | ICD-10-CM

## 2021-11-25 DIAGNOSIS — H0100A Unspecified blepharitis right eye, upper and lower eyelids: Secondary | ICD-10-CM | POA: Diagnosis not present

## 2021-11-25 DIAGNOSIS — J45909 Unspecified asthma, uncomplicated: Secondary | ICD-10-CM | POA: Insufficient documentation

## 2021-11-25 DIAGNOSIS — H04123 Dry eye syndrome of bilateral lacrimal glands: Secondary | ICD-10-CM | POA: Diagnosis not present

## 2021-11-25 DIAGNOSIS — H2512 Age-related nuclear cataract, left eye: Secondary | ICD-10-CM | POA: Diagnosis not present

## 2021-11-25 DIAGNOSIS — H259 Unspecified age-related cataract: Secondary | ICD-10-CM

## 2021-11-25 DIAGNOSIS — H0100B Unspecified blepharitis left eye, upper and lower eyelids: Secondary | ICD-10-CM | POA: Insufficient documentation

## 2021-11-25 DIAGNOSIS — Z79899 Other long term (current) drug therapy: Secondary | ICD-10-CM | POA: Insufficient documentation

## 2021-11-25 DIAGNOSIS — E1136 Type 2 diabetes mellitus with diabetic cataract: Secondary | ICD-10-CM | POA: Insufficient documentation

## 2021-11-25 DIAGNOSIS — K449 Diaphragmatic hernia without obstruction or gangrene: Secondary | ICD-10-CM | POA: Diagnosis not present

## 2021-11-25 DIAGNOSIS — M1990 Unspecified osteoarthritis, unspecified site: Secondary | ICD-10-CM | POA: Diagnosis not present

## 2021-11-25 DIAGNOSIS — F419 Anxiety disorder, unspecified: Secondary | ICD-10-CM | POA: Insufficient documentation

## 2021-11-25 HISTORY — PX: CATARACT EXTRACTION W/PHACO: SHX586

## 2021-11-25 LAB — GLUCOSE, CAPILLARY: Glucose-Capillary: 115 mg/dL — ABNORMAL HIGH (ref 70–99)

## 2021-11-25 SURGERY — PHACOEMULSIFICATION, CATARACT, WITH IOL INSERTION
Anesthesia: Monitor Anesthesia Care | Site: Eye | Laterality: Left

## 2021-11-25 MED ORDER — PHENYLEPHRINE HCL 2.5 % OP SOLN
1.0000 [drp] | OPHTHALMIC | Status: AC | PRN
  Administered 2021-11-25 (×3): 1 [drp] via OPHTHALMIC

## 2021-11-25 MED ORDER — EPINEPHRINE PF 1 MG/ML IJ SOLN
INTRAOCULAR | Status: DC | PRN
  Administered 2021-11-25: 500 mL

## 2021-11-25 MED ORDER — SODIUM CHLORIDE 0.9% FLUSH
INTRAVENOUS | Status: DC | PRN
  Administered 2021-11-25: 5 mL via INTRAVENOUS

## 2021-11-25 MED ORDER — SODIUM HYALURONATE 23MG/ML IO SOSY
PREFILLED_SYRINGE | INTRAOCULAR | Status: DC | PRN
  Administered 2021-11-25: 0.6 mL via INTRAOCULAR

## 2021-11-25 MED ORDER — LIDOCAINE HCL (PF) 1 % IJ SOLN
INTRAOCULAR | Status: DC | PRN
  Administered 2021-11-25: 1 mL via OPHTHALMIC

## 2021-11-25 MED ORDER — STERILE WATER FOR IRRIGATION IR SOLN
Status: DC | PRN
  Administered 2021-11-25: 250 mL

## 2021-11-25 MED ORDER — SODIUM HYALURONATE 10 MG/ML IO SOLUTION
PREFILLED_SYRINGE | INTRAOCULAR | Status: DC | PRN
  Administered 2021-11-25: 0.85 mL via INTRAOCULAR

## 2021-11-25 MED ORDER — MIDAZOLAM HCL 2 MG/2ML IJ SOLN
INTRAMUSCULAR | Status: AC
  Filled 2021-11-25: qty 2

## 2021-11-25 MED ORDER — BSS IO SOLN
INTRAOCULAR | Status: DC | PRN
  Administered 2021-11-25: 15 mL via INTRAOCULAR

## 2021-11-25 MED ORDER — LIDOCAINE HCL 3.5 % OP GEL
1.0000 | Freq: Once | OPHTHALMIC | Status: AC
  Administered 2021-11-25: 1 via OPHTHALMIC

## 2021-11-25 MED ORDER — EPINEPHRINE PF 1 MG/ML IJ SOLN
INTRAMUSCULAR | Status: AC
  Filled 2021-11-25: qty 2

## 2021-11-25 MED ORDER — NEOMYCIN-POLYMYXIN-DEXAMETH 3.5-10000-0.1 OP SUSP
OPHTHALMIC | Status: DC | PRN
  Administered 2021-11-25: 1 [drp] via OPHTHALMIC

## 2021-11-25 MED ORDER — POVIDONE-IODINE 5 % OP SOLN
OPHTHALMIC | Status: DC | PRN
  Administered 2021-11-25: 1 via OPHTHALMIC

## 2021-11-25 MED ORDER — TETRACAINE HCL 0.5 % OP SOLN
1.0000 [drp] | OPHTHALMIC | Status: AC | PRN
  Administered 2021-11-25 (×3): 1 [drp] via OPHTHALMIC

## 2021-11-25 MED ORDER — MIDAZOLAM HCL 2 MG/2ML IJ SOLN
INTRAMUSCULAR | Status: DC | PRN
  Administered 2021-11-25: 1 mg via INTRAVENOUS

## 2021-11-25 MED ORDER — TROPICAMIDE 1 % OP SOLN
1.0000 [drp] | OPHTHALMIC | Status: AC | PRN
  Administered 2021-11-25 (×3): 1 [drp] via OPHTHALMIC

## 2021-11-25 SURGICAL SUPPLY — 13 items
CATARACT SUITE SIGHTPATH (MISCELLANEOUS) ×2 IMPLANT
CLOTH BEACON ORANGE TIMEOUT ST (SAFETY) ×2 IMPLANT
EYE SHIELD UNIVERSAL CLEAR (GAUZE/BANDAGES/DRESSINGS) ×1 IMPLANT
FEE CATARACT SUITE SIGHTPATH (MISCELLANEOUS) ×1 IMPLANT
GLOVE BIOGEL PI IND STRL 7.0 (GLOVE) ×2 IMPLANT
GLOVE BIOGEL PI INDICATOR 7.0 (GLOVE) ×1
GLOVE SURG SS PI 7.0 STRL IVOR (GLOVE) ×1 IMPLANT
LENS IOL RAYNER 22.0 (Intraocular Lens) ×2 IMPLANT
LENS IOL RAYONE EMV 22.0 (Intraocular Lens) IMPLANT
PAD ARMBOARD 7.5X6 YLW CONV (MISCELLANEOUS) ×2 IMPLANT
SYR TB 1ML LL NO SAFETY (SYRINGE) ×2 IMPLANT
TAPE PAPER 1X10 WHT MICROPORE (GAUZE/BANDAGES/DRESSINGS) ×1 IMPLANT
WATER STERILE IRR 250ML POUR (IV SOLUTION) ×2 IMPLANT

## 2021-11-25 NOTE — Anesthesia Preprocedure Evaluation (Signed)
Anesthesia Evaluation  Patient identified by MRN, date of birth, ID band Patient awake    Reviewed: Allergy & Precautions, NPO status , Patient's Chart, lab work & pertinent test results, reviewed documented beta blocker date and time   Airway Mallampati: II  TM Distance: >3 FB Neck ROM: Full    Dental  (+) Edentulous Upper, Edentulous Lower   Pulmonary asthma ,    Pulmonary exam normal breath sounds clear to auscultation       Cardiovascular Exercise Tolerance: Good hypertension, Pt. on home beta blockers and Pt. on medications + angina at rest Normal cardiovascular exam Rhythm:Regular Rate:Normal     Neuro/Psych PSYCHIATRIC DISORDERS Anxiety Depression  Neuromuscular disease CVA, Residual Symptoms    GI/Hepatic Neg liver ROS, hiatal hernia, GERD  Medicated and Poorly Controlled,  Endo/Other  diabetes, Well Controlled, Type 2, Oral Hypoglycemic Agents  Renal/GU Renal InsufficiencyRenal disease  negative genitourinary   Musculoskeletal  (+) Arthritis , Osteoarthritis,    Abdominal   Peds negative pediatric ROS (+)  Hematology negative hematology ROS (+)   Anesthesia Other Findings Severe back pain  Reproductive/Obstetrics negative OB ROS                             Anesthesia Physical  Anesthesia Plan  ASA: 3  Anesthesia Plan: MAC   Post-op Pain Management: Minimal or no pain anticipated   Induction:   PONV Risk Score and Plan:   Airway Management Planned: Nasal Cannula and Natural Airway  Additional Equipment:   Intra-op Plan:   Post-operative Plan:   Informed Consent: I have reviewed the patients History and Physical, chart, labs and discussed the procedure including the risks, benefits and alternatives for the proposed anesthesia with the patient or authorized representative who has indicated his/her understanding and acceptance.     Dental advisory given  Plan  Discussed with: CRNA and Surgeon  Anesthesia Plan Comments:         Anesthesia Quick Evaluation

## 2021-11-25 NOTE — Discharge Instructions (Addendum)
Please discharge patient when stable, will follow up today with Dr. Wrzosek at the [facility] office immediately following discharge.  Leave shield in place until visit.  All paperwork with discharge instructions will be given at the office.  [facility] Address:  [address]  , [place] [address]  

## 2021-11-25 NOTE — Anesthesia Procedure Notes (Signed)
Procedure Name: MAC Date/Time: 11/25/2021 10:46 AM  Performed by: Orlie Dakin, CRNAPre-anesthesia Checklist: Patient identified, Emergency Drugs available, Suction available and Patient being monitored Patient Re-evaluated:Patient Re-evaluated prior to induction Oxygen Delivery Method: Nasal cannula Placement Confirmation: positive ETCO2

## 2021-11-25 NOTE — Interval H&P Note (Signed)
History and Physical Interval Note:  11/25/2021 10:39 AM  Dana Romero  has presented today for surgery, with the diagnosis of nuclear sclerosis age related cataract; left.  The various methods of treatment have been discussed with the patient and family. After consideration of risks, benefits and other options for treatment, the patient has consented to  Procedure(s) with comments: CATARACT EXTRACTION PHACO AND INTRAOCULAR LENS PLACEMENT (IOC) (Left) - left as a surgical intervention.  The patient's history has been reviewed, patient examined, no change in status, stable for surgery.  I have reviewed the patient's chart and labs.  Questions were answered to the patient's satisfaction.     Fabio Pierce

## 2021-11-25 NOTE — Op Note (Signed)
Date of procedure: 11/25/21  Pre-operative diagnosis: Visually significant age-related nuclear cataract, Left Eye (H25.12)  Post-operative diagnosis: Visually significant age-related nuclear cataract, Left Eye  Procedure: Removal of cataract via phacoemulsification and insertion of intra-ocular lens Rayner [id number] +22.0D into the capsular bag of the Left Eye  Attending surgeon: Gerda Diss. Marvena Tally, MD, MA  Anesthesia: MAC, Topical Akten  Complications: None  Estimated Blood Loss: <20m (minimal)  Specimens: None  Implants: As above  Indications:  Visually significant age-related cataract, Left Eye  Procedure:  The patient was seen and identified in the pre-operative area. The operative eye was identified and dilated.  The operative eye was marked.  Topical anesthesia was administered to the operative eye.     The patient was then to the operative suite and placed in the supine position.  A timeout was performed confirming the patient, procedure to be performed, and all other relevant information.   The patient's face was prepped and draped in the usual fashion for intra-ocular surgery.  A lid speculum was placed into the operative eye and the surgical microscope moved into place and focused.  An inferotemporal paracentesis was created using a 20 gauge paracentesis blade.  Shugarcaine was injected into the anterior chamber.  Viscoelastic was injected into the anterior chamber.  A temporal clear-corneal main wound incision was created using a 2.468mmicrokeratome.  A continuous curvilinear capsulorrhexis was initiated using an irrigating cystitome and completed using capsulorrhexis forceps.  Hydrodissection and hydrodeliniation were performed.  Viscoelastic was injected into the anterior chamber.  A phacoemulsification handpiece and a chopper as a second instrument were used to remove the nucleus and epinucleus. The irrigation/aspiration handpiece was used to remove any remaining cortical material.    The capsular bag was reinflated with viscoelastic, checked, and found to be intact.  The intraocular lens was inserted into the capsular bag.  The irrigation/aspiration handpiece was used to remove any remaining viscoelastic.  The clear corneal wound and paracentesis wounds were then hydrated and checked with Weck-Cels to be watertight.  The lid-speculum and drape was removed, and the patient's face was cleaned with a wet and dry 4x4.  Maxitrol was instilled in the eye. A clear shield was taped over the eye. The patient was taken to the post-operative care unit in good condition, having tolerated the procedure well.  Post-Op Instructions: [facility] and will receive all other orders and instructions.

## 2021-11-25 NOTE — Transfer of Care (Signed)
Immediate Anesthesia Transfer of Care Note  Patient: Dana Romero  Procedure(s) Performed: CATARACT EXTRACTION PHACO AND INTRAOCULAR LENS PLACEMENT (IOC) (Left: Eye)  Patient Location: Short Stay  Anesthesia Type:MAC  Level of Consciousness: awake, alert  and oriented  Airway & Oxygen Therapy: Patient Spontanous Breathing  Post-op Assessment: Report given to RN and Post -op Vital signs reviewed and stable  Post vital signs: Reviewed and stable  Last Vitals:  Vitals Value Taken Time  BP    Temp    Pulse    Resp    SpO2      Last Pain:  Vitals:   11/25/21 1002  TempSrc: Oral  PainSc: 0-No pain         Complications: No notable events documented.

## 2021-11-25 NOTE — Anesthesia Postprocedure Evaluation (Signed)
Anesthesia Post Note  Patient: Dana Romero  Procedure(s) Performed: CATARACT EXTRACTION PHACO AND INTRAOCULAR LENS PLACEMENT (IOC) (Left: Eye)  Patient location during evaluation: Phase II Anesthesia Type: MAC Level of consciousness: awake and alert and oriented Pain management: pain level controlled Vital Signs Assessment: post-procedure vital signs reviewed and stable Respiratory status: spontaneous breathing, nonlabored ventilation and respiratory function stable Cardiovascular status: blood pressure returned to baseline and stable Postop Assessment: no apparent nausea or vomiting Anesthetic complications: no   No notable events documented.   Last Vitals:  Vitals:   11/25/21 1002 11/25/21 1103  BP: (!) 181/92 (!) 181/95  Pulse: 78 73  Resp: 18 18  Temp: 36.5 C 36.7 C  SpO2: 98% 99%    Last Pain:  Vitals:   11/25/21 1103  TempSrc: Oral  PainSc:                  [name] C [name]

## 2021-11-28 ENCOUNTER — Encounter (HOSPITAL_COMMUNITY): Payer: Self-pay | Admitting: Ophthalmology

## 2021-11-28 ENCOUNTER — Telehealth: Payer: Self-pay | Admitting: *Deleted

## 2021-11-28 NOTE — Telephone Encounter (Signed)
Called pt, left detailed VM regarding imaging appts for 7/28, arrilva 9:15am, npo midnight. Provided central scheduling #

## 2021-12-02 ENCOUNTER — Other Ambulatory Visit (HOSPITAL_COMMUNITY): Payer: Medicare Other

## 2021-12-05 ENCOUNTER — Encounter (HOSPITAL_COMMUNITY): Payer: Medicare Other

## 2021-12-13 ENCOUNTER — Ambulatory Visit (HOSPITAL_COMMUNITY): Admission: RE | Admit: 2021-12-13 | Payer: Medicare HMO | Source: Ambulatory Visit

## 2021-12-13 ENCOUNTER — Ambulatory Visit (HOSPITAL_COMMUNITY)
Admission: RE | Admit: 2021-12-13 | Discharge: 2021-12-13 | Disposition: A | Discharge status: Home / Self Care | Payer: Medicare HMO | Source: Ambulatory Visit | Attending: Gastroenterology | Admitting: Gastroenterology

## 2021-12-13 DIAGNOSIS — R1084 Generalized abdominal pain: Secondary | ICD-10-CM | POA: Insufficient documentation

## 2021-12-13 DIAGNOSIS — R112 Nausea with vomiting, unspecified: Secondary | ICD-10-CM | POA: Insufficient documentation

## 2021-12-13 DIAGNOSIS — R933 Abnormal findings on diagnostic imaging of other parts of digestive tract: Secondary | ICD-10-CM | POA: Diagnosis present

## 2021-12-16 ENCOUNTER — Encounter: Payer: Self-pay | Admitting: *Deleted

## 2021-12-16 ENCOUNTER — Telehealth: Payer: Self-pay | Admitting: *Deleted

## 2021-12-16 ENCOUNTER — Other Ambulatory Visit: Payer: Self-pay | Admitting: *Deleted

## 2021-12-16 DIAGNOSIS — G8929 Other chronic pain: Secondary | ICD-10-CM

## 2021-12-16 DIAGNOSIS — R112 Nausea with vomiting, unspecified: Secondary | ICD-10-CM

## 2021-12-16 NOTE — Telephone Encounter (Signed)
Patient scheduled for EGD w/dil on 12/26/21 at 1:15 pm. Pt is also scheduled for GES on 12/29/21 at 8:00am. No stomach medications that morning. Pt verbalized understanding

## 2021-12-19 ENCOUNTER — Encounter: Payer: Self-pay | Admitting: *Deleted

## 2021-12-19 NOTE — Telephone Encounter (Signed)
PA approved via cohere. Authorization #[id number], DOS: 12/26/2021 - 03/26/2022

## 2021-12-22 ENCOUNTER — Encounter (HOSPITAL_COMMUNITY)
Admission: RE | Admit: 2021-12-22 | Discharge: 2021-12-22 | Disposition: A | Discharge status: Home / Self Care | Payer: Medicare HMO | Source: Ambulatory Visit | Attending: Internal Medicine | Admitting: Internal Medicine

## 2021-12-26 ENCOUNTER — Encounter: Payer: Self-pay | Admitting: *Deleted

## 2021-12-26 ENCOUNTER — Telehealth: Payer: Self-pay | Admitting: *Deleted

## 2021-12-26 NOTE — Telephone Encounter (Signed)
Pt called in. She was very confused regarding all of her appts and when/where she was scheduled for and for what she was scheduled for. EGD today was rescheduled. She stated the hospital called Friday and said it was going to be on Thursday. I advised on Thursday she is scheduled for GES (2 different tests).   Pt was given GES appt details again.  Rescheduled EGD to 9/13 at 10am. I gave her arrival time as she was very confused with dates/times writing everything down. Aware will send new set of instructions/pre-op appt. She voiced understanding. I also repeated this to spouse regarding both appts and what they were for.

## 2021-12-29 ENCOUNTER — Encounter (HOSPITAL_COMMUNITY): Payer: Self-pay

## 2021-12-29 ENCOUNTER — Ambulatory Visit (HOSPITAL_COMMUNITY)
Admission: RE | Admit: 2021-12-29 | Discharge: 2021-12-29 | Disposition: A | Discharge status: Home / Self Care | Payer: Medicare HMO | Source: Ambulatory Visit | Attending: Gastroenterology | Admitting: Gastroenterology

## 2021-12-29 DIAGNOSIS — R112 Nausea with vomiting, unspecified: Secondary | ICD-10-CM | POA: Diagnosis present

## 2021-12-29 DIAGNOSIS — R1013 Epigastric pain: Secondary | ICD-10-CM | POA: Diagnosis present

## 2021-12-29 DIAGNOSIS — G8929 Other chronic pain: Secondary | ICD-10-CM | POA: Diagnosis present

## 2021-12-29 MED ORDER — TECHNETIUM TC 99M SULFUR COLLOID
2.0000 | Freq: Once | INTRAVENOUS | Status: AC | PRN
  Administered 2021-12-29: 2.2 via ORAL

## 2022-01-02 ENCOUNTER — Encounter (HOSPITAL_COMMUNITY)
Admission: RE | Admit: 2022-01-02 | Discharge: 2022-01-02 | Disposition: A | Discharge status: Home / Self Care | Payer: Medicare HMO | Source: Ambulatory Visit | Attending: Ophthalmology | Admitting: Ophthalmology

## 2022-01-03 NOTE — H&P (Signed)
Surgical History & Physical  Patient Name: Dana Romero DOB: Jan 11, 1951  Surgery: Cataract extraction with intraocular lens implant phacoemulsification; Right Eye  Surgeon: Fabio Pierce MD Surgery Date:  01-06-22 Pre-Op Date:  12-12-21  HPI: A 29 Yr. old female patient 1. The patient is returning after cataract post-op. The left eye is affected. Status post cataract post-op, which began 1 week ago: Since the last visit, the affected area is doing well. The patient's vision is stable. Patient is following medication instructions. 2. 2. The patient complains of difficulty when recognizing people, which began 1 year ago. The right eye is affected. The episode is gradual. The condition's severity increased since last visit. Symptoms occur when the patient is inside and outside. This is negatively affecting the patient's quality of life and the patient is unable to function adequately in life with the current level of vision. HPI was performed by Fabio Pierce .  Medical History: Dry Eyes Cataracts Anxiety Diabetes - DM Type 2 High Blood Pressure  Review of Systems Negative Allergic/Immunologic Negative Cardiovascular Negative Constitutional Negative Ear, Nose, Mouth & Throat Negative Endocrine Negative Eyes Negative Gastrointestinal Negative Genitourinary Negative Hemotologic/Lymphatic Negative Integumentary Negative Musculoskeletal Negative Neurological Negative Psychiatry Negative Respiratory  Social   Never Smoked  Medication Moxifloxacin, Ilevro, Prednisolone acetate 1%,  Ocipic shot, BP pill, Xanax,   Sx/Procedures Phaco c IOL OS,  Appendectomy, Gallbladder Removal, Breast cyst removals,   Drug Allergies   NKDA  History & Physical: Heent: Cataract, right eye NECK: supple without bruits LUNGS: lungs clear to auscultation CV: regular rate and rhythm Abdomen: soft and non-tender Impression & Plan: Assessment: 1.  CATARACT EXTRACTION STATUS; Left Eye  (Z98.42) 2.  NUCLEAR SCLEROSIS AGE RELATED; , Right Eye (H25.11) 3.  Myopia ; Left Eye (H52.12)  Plan: 1.  2 weeks after cataract surgery. Doing well with improved vision and normal eye pressure. Call with any problems or concerns. Significant dry eye - vision improves with blinking.  2.  Cataract accounts for the patient's decreased vision. This visual impairment is not correctable with a tolerable change in glasses or contact lenses. Cataract surgery with an implantation of a new lens should significantly improve the visual and functional status of the patient. Discussed all risks, benefits, alternatives, and potential complications. Discussed the procedures and recovery. Patient desires to have surgery. A-scan ordered and performed today for intra-ocular lens calculations. The surgery will be performed in order to improve vision for driving, reading, and for eye examinations. Recommend phacoemulsification with intra-ocular lens. Recommend Dextenza for post-operative pain and inflammation. Right Eye. Surgery required to correct imbalance of vision. Dilates well - shugarcaine by protocol.  3.

## 2022-01-06 ENCOUNTER — Encounter (HOSPITAL_COMMUNITY): Payer: Self-pay | Admitting: Ophthalmology

## 2022-01-06 ENCOUNTER — Ambulatory Visit (HOSPITAL_BASED_OUTPATIENT_CLINIC_OR_DEPARTMENT_OTHER): Payer: Medicare Other | Admitting: Certified Registered"

## 2022-01-06 ENCOUNTER — Ambulatory Visit (HOSPITAL_COMMUNITY): Payer: Medicare Other | Admitting: Certified Registered"

## 2022-01-06 ENCOUNTER — Ambulatory Visit (HOSPITAL_COMMUNITY)
Admission: RE | Admit: 2022-01-06 | Discharge: 2022-01-06 | Disposition: A | Discharge status: Home / Self Care | Payer: Medicare Other | Attending: Ophthalmology | Admitting: Ophthalmology

## 2022-01-06 ENCOUNTER — Encounter (HOSPITAL_COMMUNITY)
Admission: RE | Disposition: A | Discharge status: Home / Self Care | Payer: Self-pay | Source: Home / Self Care | Attending: Ophthalmology

## 2022-01-06 DIAGNOSIS — I10 Essential (primary) hypertension: Secondary | ICD-10-CM | POA: Insufficient documentation

## 2022-01-06 DIAGNOSIS — Z8673 Personal history of transient ischemic attack (TIA), and cerebral infarction without residual deficits: Secondary | ICD-10-CM | POA: Insufficient documentation

## 2022-01-06 DIAGNOSIS — E1136 Type 2 diabetes mellitus with diabetic cataract: Secondary | ICD-10-CM | POA: Diagnosis not present

## 2022-01-06 DIAGNOSIS — K219 Gastro-esophageal reflux disease without esophagitis: Secondary | ICD-10-CM | POA: Insufficient documentation

## 2022-01-06 DIAGNOSIS — Z8719 Personal history of other diseases of the digestive system: Secondary | ICD-10-CM | POA: Diagnosis not present

## 2022-01-06 DIAGNOSIS — H2511 Age-related nuclear cataract, right eye: Secondary | ICD-10-CM | POA: Diagnosis not present

## 2022-01-06 DIAGNOSIS — J45909 Unspecified asthma, uncomplicated: Secondary | ICD-10-CM | POA: Insufficient documentation

## 2022-01-06 DIAGNOSIS — E119 Type 2 diabetes mellitus without complications: Secondary | ICD-10-CM | POA: Diagnosis not present

## 2022-01-06 DIAGNOSIS — Z7984 Long term (current) use of oral hypoglycemic drugs: Secondary | ICD-10-CM | POA: Insufficient documentation

## 2022-01-06 HISTORY — PX: CATARACT EXTRACTION W/PHACO: SHX586

## 2022-01-06 LAB — GLUCOSE, CAPILLARY: Glucose-Capillary: 136 mg/dL — ABNORMAL HIGH (ref 70–99)

## 2022-01-06 SURGERY — PHACOEMULSIFICATION, CATARACT, WITH IOL INSERTION
Anesthesia: Monitor Anesthesia Care | Site: Eye | Laterality: Right

## 2022-01-06 MED ORDER — SODIUM HYALURONATE 10 MG/ML IO SOLUTION
PREFILLED_SYRINGE | INTRAOCULAR | Status: DC | PRN
  Administered 2022-01-06: 0.85 mL via INTRAOCULAR

## 2022-01-06 MED ORDER — PHENYLEPHRINE HCL 2.5 % OP SOLN
1.0000 [drp] | OPHTHALMIC | Status: AC | PRN
  Administered 2022-01-06 (×3): 1 [drp] via OPHTHALMIC

## 2022-01-06 MED ORDER — MIDAZOLAM HCL 2 MG/2ML IJ SOLN
INTRAMUSCULAR | Status: DC | PRN
  Administered 2022-01-06: 1 mg via INTRAVENOUS

## 2022-01-06 MED ORDER — STERILE WATER FOR IRRIGATION IR SOLN
Status: DC | PRN
  Administered 2022-01-06: 500 mL

## 2022-01-06 MED ORDER — TROPICAMIDE 1 % OP SOLN
1.0000 [drp] | OPHTHALMIC | Status: AC | PRN
  Administered 2022-01-06 (×3): 1 [drp] via OPHTHALMIC

## 2022-01-06 MED ORDER — LIDOCAINE HCL (PF) 1 % IJ SOLN
INTRAOCULAR | Status: DC | PRN
  Administered 2022-01-06: 1 mL via OPHTHALMIC

## 2022-01-06 MED ORDER — TETRACAINE HCL 0.5 % OP SOLN
1.0000 [drp] | OPHTHALMIC | Status: AC | PRN
  Administered 2022-01-06 (×3): 1 [drp] via OPHTHALMIC

## 2022-01-06 MED ORDER — POVIDONE-IODINE 5 % OP SOLN
OPHTHALMIC | Status: DC | PRN
  Administered 2022-01-06: 1 via OPHTHALMIC

## 2022-01-06 MED ORDER — EPINEPHRINE PF 1 MG/ML IJ SOLN
INTRAOCULAR | Status: DC | PRN
  Administered 2022-01-06: 500 mL

## 2022-01-06 MED ORDER — SODIUM HYALURONATE 23MG/ML IO SOSY
PREFILLED_SYRINGE | INTRAOCULAR | Status: DC | PRN
  Administered 2022-01-06: 0.6 mL via INTRAOCULAR

## 2022-01-06 MED ORDER — NEOMYCIN-POLYMYXIN-DEXAMETH 3.5-10000-0.1 OP SUSP
OPHTHALMIC | Status: DC | PRN
  Administered 2022-01-06: 2 [drp] via OPHTHALMIC

## 2022-01-06 MED ORDER — MIDAZOLAM HCL 2 MG/2ML IJ SOLN
INTRAMUSCULAR | Status: AC
  Filled 2022-01-06: qty 2

## 2022-01-06 MED ORDER — LIDOCAINE HCL 3.5 % OP GEL
1.0000 | Freq: Once | OPHTHALMIC | Status: AC
  Administered 2022-01-06: 1 via OPHTHALMIC

## 2022-01-06 MED ORDER — BSS IO SOLN
INTRAOCULAR | Status: DC | PRN
  Administered 2022-01-06: 15 mL via INTRAOCULAR

## 2022-01-06 SURGICAL SUPPLY — 13 items
CATARACT SUITE SIGHTPATH (MISCELLANEOUS) ×1 IMPLANT
CLOTH BEACON ORANGE TIMEOUT ST (SAFETY) ×1 IMPLANT
EYE SHIELD UNIVERSAL CLEAR (GAUZE/BANDAGES/DRESSINGS) IMPLANT
FEE CATARACT SUITE SIGHTPATH (MISCELLANEOUS) ×1 IMPLANT
GLOVE BIOGEL PI IND STRL 7.0 (GLOVE) ×2 IMPLANT
LENS IOL RAYNER 22.0 (Intraocular Lens) ×1 IMPLANT
LENS IOL RAYONE EMV 22.0 (Intraocular Lens) IMPLANT
NDL HYPO 18GX1.5 BLUNT FILL (NEEDLE) ×1 IMPLANT
NEEDLE HYPO 18GX1.5 BLUNT FILL (NEEDLE) ×1 IMPLANT
PAD ARMBOARD 7.5X6 YLW CONV (MISCELLANEOUS) ×1 IMPLANT
SYR TB 1ML LL NO SAFETY (SYRINGE) ×1 IMPLANT
TAPE PAPER 1X10 WHT MICROPORE (GAUZE/BANDAGES/DRESSINGS) IMPLANT
WATER STERILE IRR 500ML POUR (IV SOLUTION) IMPLANT

## 2022-01-06 NOTE — Anesthesia Preprocedure Evaluation (Signed)
Anesthesia Evaluation  Patient identified by MRN, date of birth, ID band Patient awake    Reviewed: Allergy & Precautions, NPO status , Patient's Chart, lab work & pertinent test results, reviewed documented beta blocker date and time   Airway Mallampati: II  TM Distance: >3 FB Neck ROM: Full    Dental  (+) Edentulous Upper, Edentulous Lower   Pulmonary asthma ,    Pulmonary exam normal breath sounds clear to auscultation       Cardiovascular Exercise Tolerance: Good hypertension, Pt. on home beta blockers and Pt. on medications + angina at rest Normal cardiovascular exam Rhythm:Regular Rate:Normal     Neuro/Psych PSYCHIATRIC DISORDERS Anxiety Depression  Neuromuscular disease CVA, Residual Symptoms    GI/Hepatic Neg liver ROS, hiatal hernia, GERD  Medicated and Poorly Controlled,  Endo/Other  diabetes, Well Controlled, Type 2, Oral Hypoglycemic Agents  Renal/GU Renal InsufficiencyRenal disease  negative genitourinary   Musculoskeletal  (+) Arthritis , Osteoarthritis,    Abdominal   Peds negative pediatric ROS (+)  Hematology negative hematology ROS (+)   Anesthesia Other Findings Severe back pain  Reproductive/Obstetrics negative OB ROS                             Anesthesia Physical  Anesthesia Plan  ASA: 3  Anesthesia Plan: MAC   Post-op Pain Management: Minimal or no pain anticipated   Induction:   PONV Risk Score and Plan:   Airway Management Planned: Nasal Cannula and Natural Airway  Additional Equipment:   Intra-op Plan:   Post-operative Plan:   Informed Consent: I have reviewed the patients History and Physical, chart, labs and discussed the procedure including the risks, benefits and alternatives for the proposed anesthesia with the patient or authorized representative who has indicated his/her understanding and acceptance.     Dental advisory given  Plan  Discussed with: CRNA and Surgeon  Anesthesia Plan Comments:         Anesthesia Quick Evaluation

## 2022-01-06 NOTE — Discharge Instructions (Addendum)
Please discharge patient when stable, will follow up today with Dr. Wrzosek at the [facility] office immediately following discharge.  Leave shield in place until visit.  All paperwork with discharge instructions will be given at the office.  [facility] Address:  [address]  [place], [place] [address]  

## 2022-01-06 NOTE — Interval H&P Note (Signed)
History and Physical Interval Note:  01/06/2022 7:52 AM  Dana Romero  has presented today for surgery, with the diagnosis of nuclear sclerosis age related cataract; right.  The various methods of treatment have been discussed with the patient and family. After consideration of risks, benefits and other options for treatment, the patient has consented to  Procedure(s) with comments: CATARACT EXTRACTION PHACO AND INTRAOCULAR LENS PLACEMENT (IOC) (Right) - right as a surgical intervention.  The patient's history has been reviewed, patient examined, no change in status, stable for surgery.  I have reviewed the patient's chart and labs.  Questions were answered to the patient's satisfaction.     Fabio Pierce

## 2022-01-06 NOTE — Op Note (Signed)
Date of procedure: 01/06/22  Pre-operative diagnosis: Visually significant age-related nuclear cataract, Right Eye (H25.11)  Post-operative diagnosis: Visually significant age-related nuclear cataract, Right Eye  Procedure: Removal of cataract via phacoemulsification and insertion of intra-ocular lens Rayner [id number] +22.0D into the capsular bag of the Right Eye  Attending surgeon: Gerda Diss. Jax [place], MD, MA  Anesthesia: MAC, Topical Akten  Complications: None  Estimated Blood Loss: <67m (minimal)  Specimens: None  Implants: As above  Indications:  Visually significant age-related cataract, Right Eye  Procedure:  The patient was seen and identified in the pre-operative area. The operative eye was identified and dilated.  The operative eye was marked.  Topical anesthesia was administered to the operative eye.     The patient was then to the operative suite and placed in the supine position.  A timeout was performed confirming the patient, procedure to be performed, and all other relevant information.   The patient's face was prepped and draped in the usual fashion for intra-ocular surgery.  A lid speculum was placed into the operative eye and the surgical microscope moved into place and focused.  A superotemporal paracentesis was created using a 20 gauge paracentesis blade.  Shugarcaine was injected into the anterior chamber.  Viscoelastic was injected into the anterior chamber.  A temporal clear-corneal main wound incision was created using a 2.498mmicrokeratome.  A continuous curvilinear capsulorrhexis was initiated using an irrigating cystitome and completed using capsulorrhexis forceps.  Hydrodissection and hydrodeliniation were performed.  Viscoelastic was injected into the anterior chamber.  A phacoemulsification handpiece and a chopper as a second instrument were used to remove the nucleus and epinucleus. The irrigation/aspiration handpiece was used to remove any remaining cortical  material.   The capsular bag was reinflated with viscoelastic, checked, and found to be intact.  The intraocular lens was inserted into the capsular bag.  The irrigation/aspiration handpiece was used to remove any remaining viscoelastic.  The clear corneal wound and paracentesis wounds were then hydrated and checked with Weck-Cels to be watertight.  The lid-speculum and drape was removed, and the patient's face was cleaned with a wet and dry 4x4.  Maxitrol was instilled in the eye. A clear shield was taped over the eye. The patient was taken to the post-operative care unit in good condition, having tolerated the procedure well.  Post-Op Instructions: [facility] and will receive all other orders and instructions.

## 2022-01-06 NOTE — Transfer of Care (Signed)
Immediate Anesthesia Transfer of Care Note  Patient: Dana Romero  Procedure(s) Performed: CATARACT EXTRACTION PHACO AND INTRAOCULAR LENS PLACEMENT (IOC) (Right: Eye)  Patient Location: Short Stay  Anesthesia Type:MAC  Level of Consciousness: awake, alert  and oriented  Airway & Oxygen Therapy: Patient Spontanous Breathing  Post-op Assessment: Report given to RN, Post -op Vital signs reviewed and stable and Patient moving all extremities  Post vital signs: Reviewed and stable  Last Vitals:  Vitals Value Taken Time  BP    Temp    Pulse    Resp    SpO2      Last Pain:  Vitals:   01/06/22 0728  TempSrc: Oral  PainSc: 0-No pain      Patients Stated Pain Goal: 6 (36/46/80 3212)  Complications: No notable events documented.

## 2022-01-07 NOTE — Anesthesia Postprocedure Evaluation (Signed)
Anesthesia Post Note  Patient: Dana Romero  Procedure(s) Performed: CATARACT EXTRACTION PHACO AND INTRAOCULAR LENS PLACEMENT (IOC) (Right: Eye)  Patient location during evaluation: Phase II Anesthesia Type: MAC Level of consciousness: awake Pain management: pain level controlled Vital Signs Assessment: post-procedure vital signs reviewed and stable Respiratory status: spontaneous breathing and respiratory function stable Cardiovascular status: blood pressure returned to baseline and stable Postop Assessment: no headache and no apparent nausea or vomiting Anesthetic complications: no Comments: Late entry   No notable events documented.   Last Vitals:  Vitals:   01/06/22 0728 01/06/22 0817  BP: (!) 161/96 (!) 170/82  Pulse: 72 72  Resp: 14 16  Temp: 36.9 C 37.1 C  SpO2: 99% 100%    Last Pain:  Vitals:   01/06/22 0817  TempSrc: Oral  PainSc: Loco

## 2022-01-10 ENCOUNTER — Other Ambulatory Visit (HOSPITAL_COMMUNITY): Payer: Medicare Other

## 2022-01-11 ENCOUNTER — Encounter (HOSPITAL_COMMUNITY)
Admission: RE | Admit: 2022-01-11 | Discharge: 2022-01-11 | Disposition: A | Discharge status: Home / Self Care | Payer: Medicare Other | Source: Ambulatory Visit | Attending: Internal Medicine | Admitting: Internal Medicine

## 2022-01-12 ENCOUNTER — Encounter (HOSPITAL_COMMUNITY): Payer: Self-pay | Admitting: Ophthalmology

## 2022-01-17 ENCOUNTER — Telehealth: Payer: Self-pay | Admitting: *Deleted

## 2022-01-17 NOTE — Telephone Encounter (Signed)
PA: APPROVED Authorization #: [id number]  DOS: 01/18/22-04/18/22

## 2022-01-17 NOTE — Telephone Encounter (Signed)
PA: APPROVED Authorization #: [id number]  DOS: 01/18/22-04/18/22 

## 2022-01-18 ENCOUNTER — Ambulatory Visit (HOSPITAL_COMMUNITY): Payer: Medicare Other | Admitting: Anesthesiology

## 2022-01-18 ENCOUNTER — Ambulatory Visit (HOSPITAL_BASED_OUTPATIENT_CLINIC_OR_DEPARTMENT_OTHER): Payer: Medicare Other | Admitting: Anesthesiology

## 2022-01-18 ENCOUNTER — Ambulatory Visit (HOSPITAL_COMMUNITY)
Admission: RE | Admit: 2022-01-18 | Discharge: 2022-01-18 | Disposition: A | Discharge status: Home / Self Care | Payer: Medicare Other | Attending: Internal Medicine | Admitting: Internal Medicine

## 2022-01-18 ENCOUNTER — Encounter (HOSPITAL_COMMUNITY): Payer: Self-pay

## 2022-01-18 ENCOUNTER — Encounter (HOSPITAL_COMMUNITY)
Admission: RE | Disposition: A | Discharge status: Home / Self Care | Payer: Self-pay | Source: Home / Self Care | Attending: Internal Medicine

## 2022-01-18 DIAGNOSIS — Z7984 Long term (current) use of oral hypoglycemic drugs: Secondary | ICD-10-CM | POA: Insufficient documentation

## 2022-01-18 DIAGNOSIS — E119 Type 2 diabetes mellitus without complications: Secondary | ICD-10-CM

## 2022-01-18 DIAGNOSIS — F419 Anxiety disorder, unspecified: Secondary | ICD-10-CM | POA: Insufficient documentation

## 2022-01-18 DIAGNOSIS — K2970 Gastritis, unspecified, without bleeding: Secondary | ICD-10-CM | POA: Insufficient documentation

## 2022-01-18 DIAGNOSIS — I209 Angina pectoris, unspecified: Secondary | ICD-10-CM | POA: Diagnosis not present

## 2022-01-18 DIAGNOSIS — K222 Esophageal obstruction: Secondary | ICD-10-CM | POA: Insufficient documentation

## 2022-01-18 DIAGNOSIS — I10 Essential (primary) hypertension: Secondary | ICD-10-CM | POA: Diagnosis not present

## 2022-01-18 DIAGNOSIS — K219 Gastro-esophageal reflux disease without esophagitis: Secondary | ICD-10-CM | POA: Diagnosis not present

## 2022-01-18 DIAGNOSIS — R1310 Dysphagia, unspecified: Secondary | ICD-10-CM | POA: Insufficient documentation

## 2022-01-18 DIAGNOSIS — J45909 Unspecified asthma, uncomplicated: Secondary | ICD-10-CM | POA: Diagnosis not present

## 2022-01-18 DIAGNOSIS — Z79899 Other long term (current) drug therapy: Secondary | ICD-10-CM | POA: Diagnosis not present

## 2022-01-18 DIAGNOSIS — Z8673 Personal history of transient ischemic attack (TIA), and cerebral infarction without residual deficits: Secondary | ICD-10-CM | POA: Diagnosis not present

## 2022-01-18 DIAGNOSIS — F32A Depression, unspecified: Secondary | ICD-10-CM | POA: Diagnosis not present

## 2022-01-18 DIAGNOSIS — K449 Diaphragmatic hernia without obstruction or gangrene: Secondary | ICD-10-CM | POA: Insufficient documentation

## 2022-01-18 DIAGNOSIS — R933 Abnormal findings on diagnostic imaging of other parts of digestive tract: Secondary | ICD-10-CM | POA: Insufficient documentation

## 2022-01-18 HISTORY — PX: ESOPHAGOGASTRODUODENOSCOPY (EGD) WITH PROPOFOL: SHX5813

## 2022-01-18 HISTORY — PX: BALLOON DILATION: SHX5330

## 2022-01-18 HISTORY — PX: BIOPSY: SHX5522

## 2022-01-18 LAB — GLUCOSE, CAPILLARY: Glucose-Capillary: 96 mg/dL (ref 70–99)

## 2022-01-18 SURGERY — ESOPHAGOGASTRODUODENOSCOPY (EGD) WITH PROPOFOL
Anesthesia: General

## 2022-01-18 MED ORDER — PROPOFOL 500 MG/50ML IV EMUL
INTRAVENOUS | Status: DC | PRN
  Administered 2022-01-18: 150 ug/kg/min via INTRAVENOUS

## 2022-01-18 MED ORDER — LACTATED RINGERS IV SOLN: INTRAVENOUS | Status: DC

## 2022-01-18 MED ORDER — PROPOFOL 500 MG/50ML IV EMUL
INTRAVENOUS | Status: AC
  Filled 2022-01-18: qty 50

## 2022-01-18 MED ORDER — PROPOFOL 10 MG/ML IV BOLUS
INTRAVENOUS | Status: DC | PRN
  Administered 2022-01-18: 60 mg via INTRAVENOUS

## 2022-01-18 NOTE — Transfer of Care (Signed)
Immediate Anesthesia Transfer of Care Note  Patient: Dana Romero  Procedure(s) Performed: ESOPHAGOGASTRODUODENOSCOPY (EGD) WITH PROPOFOL BALLOON DILATION BIOPSY  Patient Location: PACU  Anesthesia Type:General  Level of Consciousness: awake, alert  and oriented  Airway & Oxygen Therapy: Patient Spontanous Breathing  Post-op Assessment: Report given to RN, Post -op Vital signs reviewed and stable, Patient moving all extremities X 4 and Patient able to stick tongue midline  Post vital signs: Reviewed  Last Vitals:  Vitals Value Taken Time  BP 156/76 01/18/22 1043  Temp 36.4 C 01/18/22 1043  Pulse 80   Resp 18 01/18/22 1043  SpO2 94 % 01/18/22 1043    Last Pain:  Vitals:   01/18/22 1043  TempSrc: Oral  PainSc: 0-No pain         Complications: No notable events documented.

## 2022-01-18 NOTE — Discharge Instructions (Addendum)
EGD Discharge instructions Please read the instructions outlined below and refer to this sheet in the next few weeks. These discharge instructions provide you with general information on caring for yourself after you leave the hospital. Your doctor may also give you specific instructions. While your treatment has been planned according to the most current medical practices available, unavoidable complications occasionally occur. If you have any problems or questions after discharge, please call your doctor. ACTIVITY You may resume your regular activity but move at a slower pace for the next 24 hours.  Take frequent rest periods for the next 24 hours.  Walking will help expel (get rid of) the air and reduce the bloated feeling in your abdomen.  No driving for 24 hours (because of the anesthesia (medicine) used during the test).  You may shower.  Do not sign any important legal documents or operate any machinery for 24 hours (because of the anesthesia used during the test).  NUTRITION Drink plenty of fluids.  You may resume your normal diet.  Begin with a light meal and progress to your normal diet.  Avoid alcoholic beverages for 24 hours or as instructed by your caregiver.  MEDICATIONS You may resume your normal medications unless your caregiver tells you otherwise.  WHAT YOU CAN EXPECT TODAY You may experience abdominal discomfort such as a feeling of fullness or "gas" pains.  FOLLOW-UP Your doctor will discuss the results of your test with you.  SEEK IMMEDIATE MEDICAL ATTENTION IF ANY OF THE FOLLOWING OCCUR: Excessive nausea (feeling sick to your stomach) and/or vomiting.  Severe abdominal pain and distention (swelling).  Trouble swallowing.  Temperature over 101 F (37.8 C).  Rectal bleeding or vomiting of blood.   Your stomach showed moderate amount inflammation.  I took biopsies today to rule infection a bacteria called H. pylori.  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  I stretched this again today up to 20 mm compared to 18 mm last time.  I also further disrupted the ring with cold forceps.  Continue on Nexium daily.  Await pathology results, my office will contact you. OFFICE TO CALL WITH APPOINTMENT  Follow-up with GI in 3 to 4 months.  Hopefully her swallowing is improved.     I hope you have a great rest of your week!  Hennie Duos. Marletta Lor, D.O. Gastroenterology and Hepatology [facility]

## 2022-01-18 NOTE — H&P (Signed)
Primary Care Physician:  Percell Belt, DO Primary Gastroenterologist:  Dr. Abbey Chatters  Pre-Procedure History & Physical: HPI:  Dana Romero is a 71 y.o. female is here for an EGD with possible dilation due to dysphagia, gerd, abnormal MBE  Past Medical History:  Diagnosis Date   Angina at rest [facility])    Anxiety    Asthma    Caregiver stress 07/04/2011   Chronic pain syndrome    Degenerative disc disease, cervical    Depression    Essential hypertension 1998   GERD (gastroesophageal reflux disease)    Gout    Hiatal hernia    History of pancreatitis 06/08/2005   History of stroke    September 2020   Pelvic mass    Renal insufficiency    Seasonal allergies    Sinusitis, chronic    Stroke ([place])    Transfusion history    Type 2 diabetes mellitus ([place]) 1998    Past Surgical History:  Procedure Laterality Date   APPENDECTOMY     ARM DEBRIDEMENT Left    debridement of axilla "spider bite"   BALLOON DILATION N/A 06/30/2021   Procedure: BALLOON DILATION;  Surgeon: Eloise Harman, DO;  Location: [facility]O [facility];  Service: Endoscopy;  Laterality: N/A;   BIOPSY  06/30/2021   Procedure: BIOPSY;  Surgeon: Eloise Harman, DO;  Location: [facility];  Service: Endoscopy;;   BREAST SURGERY     breast cyst x2 -last '12   CATARACT EXTRACTION W/PHACO Left 11/25/2021   Procedure: CATARACT EXTRACTION PHACO AND INTRAOCULAR LENS PLACEMENT ([place]);  Surgeon: Baruch Goldmann, MD;  Location: [facility] ORS;  Service: Ophthalmology;  Laterality: Left;  CDE 4.11   CATARACT EXTRACTION W/PHACO Right 01/06/2022   Procedure: CATARACT EXTRACTION PHACO AND INTRAOCULAR LENS PLACEMENT (I[facility]);  Surgeon: Baruch Goldmann, MD;  Location: [facility] ORS;  Service: Ophthalmology;  Laterality: Right;  CDE 4.97   CHOLECYSTECTOMY N/A 08/11/2015   Procedure: LAPAROSCOPIC CHOLECYSTECTOMY;  Surgeon: Arta Bruce Kinsinger, MD;  Location: [facility] ORS;  Service: General;  Laterality: N/A;   ESOPHAGOGASTRODUODENOSCOPY (EGD) WITH PROPOFOL N/A  06/30/2021   Surgeon: Eloise Harman, DO;   Moderate size hiatal hernia, moderate Schatzki's ring dilated, gastritis biopsied, normal examined duodenum.  Pathology with gastric antral and oxyntic mucosa with nonspecific reactive gastropathy, negative for H. pylori.   TONSILLECTOMY     TONSILLECTOMY AND ADENOIDECTOMY     TUBAL LIGATION     post partum    Prior to Admission medications   Medication Sig Start Date End Date Taking? Authorizing Provider  ADVAIR HFA 115-21 MCG/ACT inhaler INHALE 2 PUFFS INTO LUNGS TWICE DAILY Patient taking differently: 2 puffs 2 (two) times daily as needed (respiratory issues.). 04/05/17  Yes Wardell Honour, MD  albuterol (PROVENTIL) (2.5 MG/3ML) 0.083% nebulizer solution USE ONE VIAL IN NEBULIZER EVERY 6 HOURS AS NEEDED FOR WHEEZING OR SHORTNESS OF BREATH 09/25/15  Yes Wardell Honour, MD  albuterol (VENTOLIN HFA) 108 (90 Base) MCG/ACT inhaler INHALE ONE TO TWO PUFFS BY MOUTH EVERY 6 HOURS AS NEEDED FOR WHEEZING OR SHORTNESS OF BREATH (OR PERSISTENT COUGHING) 01/18/16  Yes Wardell Honour, MD  ALPRAZolam Duanne Moron) 1 MG tablet Take 0.5-1 mg by mouth See admin instructions. Take 1 tablet (1 mg) by mouth (scheduled) in the morning at bedtime & may take 0.5 mg additional if needed for anxiety. 02/28/21  Yes [provider]  ARIPiprazole (ABILIFY) 5 MG tablet Take 5 mg by mouth at bedtime.   Yes [provider]  ascorbic acid (VITAMIN C) 500 MG tablet Take 500 mg by mouth daily.   Yes [provider]  carvedilol (COREG CR) 40 MG 24 hr capsule Take 1 capsule (40 mg total) by mouth daily. 07/04/21  Yes Hilty, Nadean Corwin, MD  cholecalciferol (VITAMIN D3) 25 MCG (1000 UNIT) tablet Take 1,000 Units by mouth daily.   Yes [provider]  diphenhydrAMINE (BENADRYL) 25 MG tablet Take 25 mg by mouth at bedtime.   Yes [provider]  esomeprazole (NEXIUM) 40 MG capsule TAKE ONE CAPSULE BY MOUTH EVERY DAY BEFORE BREAKFAST Patient taking  differently: Take 40 mg by mouth 2 (two) times daily before a meal. 04/25/17  Yes Wardell Honour, MD  estradiol (VIVELLE-DOT) 0.075 MG/24HR Place 1 patch onto the skin 2 (two) times a week. Typically Tuesdays & Thursdays 06/03/21  Yes [provider]  FLUoxetine (PROZAC) 40 MG capsule Take 80 mg by mouth every evening.   Yes [provider]  fluticasone (FLONASE) 50 MCG/ACT nasal spray Place 2 sprays into both nostrils daily as needed for allergies.   Yes [provider]  Garlic (GARLIQUE) 834 MG TBEC Take 400 mg by mouth in the morning and at bedtime.   Yes [provider]  HYDROcodone-acetaminophen (NORCO) 10-325 MG per tablet Take 1 tablet by mouth 4 (four) times daily as needed for moderate pain or severe pain.   Yes [provider]  magnesium oxide (MAG-OX) 400 MG tablet Take 400 mg by mouth daily.   Yes [provider]  meclizine (ANTIVERT) 25 MG tablet Take 25 mg by mouth 3 (three) times daily as needed for dizziness or nausea. 06/28/20  Yes [provider]  medroxyPROGESTERone (PROVERA) 2.5 MG tablet TAKE ONE TABLET BY MOUTH ONCE DAILY Patient taking differently: Take 2.5 mg by mouth See admin instructions. Take 1 tablet (2.5 mg) by mouth in the evening for 3 weeks, then off 1 week cyclically 07/12/33  Yes Wardell Honour, MD  Menthol, Topical Analgesic, (BIOFREEZE) 4 % GEL Apply 1 application  topically as needed (pain).   Yes [provider]  ondansetron (ZOFRAN) 8 MG tablet Take 1 tablet (8 mg total) by mouth every 8 (eight) hours as needed for nausea. 01/22/20  Yes Florian Buff, MD  polyethylene glycol (MIRALAX / GLYCOLAX) 17 g packet Take 17 g by mouth daily.   Yes [provider]  promethazine (PHENERGAN) 12.5 MG tablet Take 2 tablets (25 mg total) by mouth every 8 (eight) hours as needed for nausea or vomiting. 11/10/21  Yes Triplett, Tammy, PA-C  Propylene Glycol (SYSTANE BALANCE) 0.6 % SOLN Place 1 drop into  both eyes in the morning, at noon, and at bedtime.   Yes [provider]  tiZANidine (ZANAFLEX) 4 MG tablet Take 4 mg by mouth every 6 (six) hours as needed for muscle spasms.   Yes [provider]  Alcohol Swabs PADS Use to check blood sugar daily. E11.9 04/07/15   Wardell Honour, MD  blood glucose meter kit and supplies KIT Dispense based on patient and insurance preference. Use once a day.  DX: E11.9. 12/31/14   Wardell Honour, MD  blood glucose meter kit and supplies Dispense based on patient and insurance preference. Use up to four times daily as directed. (FOR ICD-10 E11.9). 03/31/15   Wardell Honour, MD  Blood Glucose Monitoring Suppl (FREESTYLE LITE) DEVI  01/12/15   [provider]  EPINEPHrine (EPIPEN 2-PAK) 0.3 mg/0.3 mL IJ  SOAJ injection Inject 0.3 mLs (0.3 mg total) into the muscle once. 01/07/15   Wardell Honour, MD  glucose blood test strip Test blood sugar daily.  DX: E11.9 04/06/15   Wardell Honour, MD  glucose monitoring kit (FREESTYLE) monitoring kit Test blood sugar daily. DX: E11.9 01/12/15   Wardell Honour, MD  Lancet Devices (LANCING DEVICE) MISC Use to test blood sugar daily. E11.9 04/07/15   Wardell Honour, MD  Lancets MISC Test blood sugar daily. DX: E11.9 04/06/15   Wardell Honour, MD    Allergies as of 12/16/2021 - Review Complete 11/25/2021  Allergen Reaction Noted   Lyrica [pregabalin]  01/22/2020   Amlodipine besylate Other (See Comments)    Atorvastatin Other (See Comments)    Ezetimibe-simvastatin Other (See Comments)    Losartan potassium-hctz Nausea Only 02/28/2011   Morphine and related Other (See Comments) 08/11/2015   Niacin Other (See Comments) 10/12/2006   Rosuvastatin Other (See Comments)    Statins  05/11/2021   Toprol xl [metoprolol succinate] Other (See Comments) 02/28/2011   Latex Rash 06/06/2011   Valium [diazepam] Anxiety 04/12/2015    Family History  Problem Relation Age of Onset   Hypertension Mother     Depression Mother    Stomach cancer Mother 73   Hypertension Brother    Pancreatic cancer Brother    Diabetes Maternal Grandmother    Hypertension Maternal Grandmother    Breast cancer Maternal Grandmother    Heart attack Maternal Grandmother    Diabetes Maternal Aunt    Breast cancer Maternal Aunt    Diabetes Maternal Uncle    Hypertension Maternal Uncle    Hypertension Maternal Uncle    Stroke Other    Colon cancer Neg Hx     Social History   Socioeconomic History   Marital status: Single    Spouse name: Not on file   Number of children: Not on file   Years of education: Not on file   Highest education level: Not on file  Occupational History   Occupation: Retired  Tobacco Use   Smoking status: Never   Smokeless tobacco: Never  Vaping Use   Vaping Use: Never used  Substance and Sexual Activity   Alcohol use: No   Drug use: No   Sexual activity: Yes    Birth control/protection: Post-menopausal  Other Topics Concern   Not on file  Social History Narrative   Not on file   Social Determinants of Health   Financial Resource Strain: Not on file  Food Insecurity: Not on file  Transportation Needs: Not on file  Physical Activity: Not on file  Stress: Not on file  Social Connections: Not on file  Intimate Partner Violence: Not on file    Review of Systems: General: Negative for fever, chills, fatigue, weakness. Eyes: Negative for vision changes.  ENT: Negative for hoarseness, difficulty swallowing , nasal congestion. CV: Negative for chest pain, angina, palpitations, dyspnea on exertion, peripheral edema.  Respiratory: Negative for dyspnea at rest, dyspnea on exertion, cough, sputum, wheezing.  GI: See history of present illness. GU:  Negative for dysuria, hematuria, urinary incontinence, urinary frequency, nocturnal urination.  MS: Negative for joint pain, low back pain.  Derm: Negative for rash or itching.  Neuro: Negative for weakness, abnormal sensation,  seizure, frequent headaches, memory loss, confusion.  Psych: Negative for anxiety, depression Endo: Negative for unusual weight change.  Heme: Negative for bruising or bleeding. Allergy: Negative for rash or hives.  Physical Exam:  Vital signs in last 24 hours: Temp:  [98.2 F (36.8 C)] 98.2 F (36.8 C) (09/13 0829) Pulse Rate:  [68] 68 (09/13 0829) Resp:  [13] 13 (09/13 0829) BP: (190)/(83) (P) 175/85 (09/13 0847) SpO2:  [100 %] 100 % (09/13 0829) Weight:  [65.8 kg] 65.8 kg (09/13 0829)   General:   Alert,  Well-developed, well-nourished, pleasant and cooperative in NAD Head:  Normocephalic and atraumatic. Eyes:  Sclera clear, no icterus.   Conjunctiva pink. Ears:  Normal auditory acuity. Nose:  No deformity, discharge,  or lesions. Mouth:  No deformity or lesions, dentition normal. Neck:  Supple; no masses or thyromegaly. Lungs:  Clear throughout to auscultation.   No wheezes, crackles, or rhonchi. No acute distress. Heart:  Regular rate and rhythm; no murmurs, clicks, rubs,  or gallops. Abdomen:  Soft, nontender and nondistended. No masses, hepatosplenomegaly or hernias noted. Normal bowel sounds, without guarding, and without rebound.   Msk:  Symmetrical without gross deformities. Normal posture. Extremities:  Without clubbing or edema. Neurologic:  Alert and  oriented x4;  grossly normal neurologically. Skin:  Intact without significant lesions or rashes. Cervical Nodes:  No significant cervical adenopathy. Psych:  Alert and cooperative. Normal mood and affect.   Impression/Plan: Dana Romero is here for an EGD with possible dilation due to dysphagia, gerd, abnormal MBE  Risks, benefits, limitations, imponderables and alternatives regarding EGD have been reviewed with the patient. Questions have been answered. All parties agreeable.

## 2022-01-18 NOTE — Anesthesia Postprocedure Evaluation (Signed)
Anesthesia Post Note  Patient: Dana Romero  Procedure(s) Performed: ESOPHAGOGASTRODUODENOSCOPY (EGD) WITH PROPOFOL BALLOON DILATION BIOPSY  Patient location during evaluation: Phase II Anesthesia Type: General Level of consciousness: awake and alert and oriented Pain management: pain level controlled Vital Signs Assessment: post-procedure vital signs reviewed and stable Respiratory status: spontaneous breathing, nonlabored ventilation and respiratory function stable Cardiovascular status: blood pressure returned to baseline and stable Postop Assessment: no apparent nausea or vomiting Anesthetic complications: no   No notable events documented.   Last Vitals:  Vitals:   01/18/22 0847 01/18/22 1043  BP: (!) (P) 175/85 (!) 156/76  Pulse:    Resp:  18  Temp:  (!) 36.4 C  SpO2:  94%    Last Pain:  Vitals:   01/18/22 1043  TempSrc: Oral  PainSc: 0-No pain                 Ahmet Schank C Peggyann Zwiefelhofer

## 2022-01-18 NOTE — Op Note (Signed)
[facility] Patient Name: Dana Romero Procedure Date: 01/18/2022 10:10 AM MRN: [id number] Date of Birth: [birth year] Attending MD: Elon Alas. Abbey Chatters DO CSN: [id number] Age: 71 Admit Type: Outpatient Procedure:                Upper GI endoscopy Indications:              Dysphagia, Heartburn, Abnormal UGI series Providers:                Elon Alas. Abbey Chatters, DO, Charlsie Quest. Insurance claims handler, Therapist, sports,                            Suzan Garibaldi. Risa Grill, Technician Referring MD:             Elon Alas. Abbey Chatters, DO Medicines:                See the Anesthesia note for documentation of the                            administered medications Complications:            No immediate complications. Estimated Blood Loss:     Estimated blood loss was minimal. Procedure:                Pre-Anesthesia Assessment:                           - The anesthesia plan was to use monitored                            anesthesia care (MAC).                           After obtaining informed consent, the endoscope was                            passed under direct vision. Throughout the                            procedure, the patient's blood pressure, pulse, and                            oxygen saturations were monitored continuously. The                            GIF-H190 ([id number]) scope was introduced through the                            mouth, and advanced to the second part of duodenum.                            The upper GI endoscopy was accomplished without                            difficulty. The patient tolerated the procedure  well. Scope In: 10:26:11 AM Scope Out: 10:37:52 AM Total Procedure Duration: 0 hours 11 minutes 41 seconds  Findings:      A medium-sized hiatal hernia was present.      A moderate Schatzki ring was found in the distal esophagus. A TTS       dilator was passed through the scope. Dilation with an 18-19-20 mm       balloon dilator was performed to 20 mm.  The dilation site was examined       and showed mild mucosal disruption and moderate improvement in luminal       narrowing. Ring was then further disrupted with cold forceps.      Diffuse moderate inflammation characterized by congestion (edema),       erythema and linear erosions was found in the entire examined stomach.       Biopsies were taken with a cold forceps for Helicobacter pylori testing.      The duodenal bulb, first portion of the duodenum and second portion of       the duodenum were normal. Impression:               - Medium-sized hiatal hernia.                           - Moderate Schatzki ring. Dilated.                           - Gastritis. Biopsied.                           - Normal duodenal bulb, first portion of the                            duodenum and second portion of the duodenum. Moderate Sedation:      Per Anesthesia Care Recommendation:           - Patient has a contact number available for                            emergencies. The signs and symptoms of potential                            delayed complications were discussed with the                            patient. Return to normal activities tomorrow.                            Written discharge instructions were provided to the                            patient.                           - Resume previous diet.                           - Continue present medications.                           -  Await pathology results.                           - Repeat upper endoscopy PRN for retreatment.                           - Return to GI office in 4 months.                           - Use Nexium (esomeprazole) 40 mg PO daily. Procedure Code(s):        --- Professional ---                           [phone], Esophagogastroduodenoscopy, flexible,                            transoral; with transendoscopic balloon dilation of                            esophagus (less than 30 mm diameter)                            43239, 59, Esophagogastroduodenoscopy, flexible,                            transoral; with biopsy, single or multiple Diagnosis Code(s):        --- Professional ---                           K44.9, Diaphragmatic hernia without obstruction or                            gangrene                           K22.2, Esophageal obstruction                           K29.70, Gastritis, unspecified, without bleeding                           R13.10, Dysphagia, unspecified                           R12, Heartburn                           R93.3, Abnormal findings on diagnostic imaging of                            other parts of digestive tract CPT copyright 2019 [employer]. All rights reserved. The codes documented in this report are preliminary and upon coder review may  be revised to meet current compliance requirements. Elon Alas. Abbey Chatters, DO [place] Abbey Chatters, DO 01/18/2022 10:41:46 AM This report has been signed electronically. Number of Addenda: 0

## 2022-01-18 NOTE — Anesthesia Preprocedure Evaluation (Signed)
Anesthesia Evaluation  Patient identified by MRN, date of birth, ID band Patient awake    Reviewed: Allergy & Precautions, NPO status , Patient's Chart, lab work & pertinent test results, reviewed documented beta blocker date and time   Airway Mallampati: II  TM Distance: >3 FB Neck ROM: Full    Dental  (+) Edentulous Upper, Edentulous Lower   Pulmonary asthma ,    Pulmonary exam normal breath sounds clear to auscultation       Cardiovascular Exercise Tolerance: Good hypertension, Pt. on home beta blockers and Pt. on medications + angina at rest Normal cardiovascular exam Rhythm:Regular Rate:Normal     Neuro/Psych PSYCHIATRIC DISORDERS Anxiety Depression  Neuromuscular disease CVA, Residual Symptoms    GI/Hepatic Neg liver ROS, hiatal hernia, GERD  Medicated and Poorly Controlled,  Endo/Other  diabetes, Well Controlled, Type 2, Oral Hypoglycemic Agents  Renal/GU Renal InsufficiencyRenal disease  negative genitourinary   Musculoskeletal  (+) Arthritis , Osteoarthritis,    Abdominal   Peds negative pediatric ROS (+)  Hematology negative hematology ROS (+)   Anesthesia Other Findings Severe back pain  Reproductive/Obstetrics negative OB ROS                             Anesthesia Physical  Anesthesia Plan  ASA: 3  Anesthesia Plan: General   Post-op Pain Management: Minimal or no pain anticipated   Induction: Intravenous  PONV Risk Score and Plan: TIVA  Airway Management Planned: Nasal Cannula and Natural Airway  Additional Equipment:   Intra-op Plan:   Post-operative Plan:   Informed Consent: I have reviewed the patients History and Physical, chart, labs and discussed the procedure including the risks, benefits and alternatives for the proposed anesthesia with the patient or authorized representative who has indicated his/her understanding and acceptance.     Dental advisory  given  Plan Discussed with: CRNA and Surgeon  Anesthesia Plan Comments:         Anesthesia Quick Evaluation

## 2022-01-19 LAB — SURGICAL PATHOLOGY

## 2022-01-24 ENCOUNTER — Encounter (HOSPITAL_COMMUNITY): Payer: Self-pay | Admitting: Internal Medicine

## 2022-03-16 ENCOUNTER — Encounter: Payer: Self-pay | Admitting: Gastroenterology

## 2022-04-19 ENCOUNTER — Other Ambulatory Visit (HOSPITAL_COMMUNITY): Payer: Self-pay | Admitting: Family Medicine

## 2022-04-19 ENCOUNTER — Ambulatory Visit: Payer: Medicare Other | Admitting: Gastroenterology

## 2022-04-19 DIAGNOSIS — R930 Abnormal findings on diagnostic imaging of skull and head, not elsewhere classified: Secondary | ICD-10-CM

## 2022-05-29 ENCOUNTER — Encounter (HOSPITAL_COMMUNITY): Payer: Self-pay

## 2022-05-29 ENCOUNTER — Ambulatory Visit (HOSPITAL_COMMUNITY)
Admission: RE | Admit: 2022-05-29 | Discharge: 2022-05-29 | Disposition: A | Discharge status: Home / Self Care | Payer: 59 | Source: Ambulatory Visit | Attending: Family Medicine | Admitting: Family Medicine

## 2022-05-29 DIAGNOSIS — R930 Abnormal findings on diagnostic imaging of skull and head, not elsewhere classified: Secondary | ICD-10-CM | POA: Diagnosis not present

## 2022-05-29 MED ORDER — GADOBUTROL 1 MMOL/ML IV SOLN
7.0000 mL | Freq: Once | INTRAVENOUS | Status: AC | PRN
  Administered 2022-05-29: 7 mL via INTRAVENOUS

## 2022-07-04 ENCOUNTER — Encounter: Payer: Self-pay | Admitting: Internal Medicine

## 2022-07-16 NOTE — Progress Notes (Signed)
Referring Provider: Percell Belt, DO Primary Care Physician:  Percell Belt, DO Primary GI Physician: Dr. Abbey Chatters  Chief Complaint  Patient presents with   Nausea    Nausea and vomiting for a while. Needs a colonoscopy and EGD.     HPI:   Dana Romero is a 72 y.o. female presenting today at the request of  Lazoff, Shawn P, DO for GERD, nausea/vomiting, weight loss, consult colonoscopy.  Patient has history of dysphagia with Schatzki ring and esophageal dysmotility, nausea, vomiting, abdominal pain, weight loss, chronic constipation.  Previously reported upper GI symptoms started at the time of losing her twin brother to pancreatic cancer in 2022 and also in the setting of uncontrolled diabetes and uncontrolled heartburn.  Last seen in our office 11/23/2021.  Intermittent generalized abdominal pain with associated nausea and intermittent vomiting without identified trigger.  Not associated with meals.  Typical heartburn well-controlled on Nexium 40 mg daily.  Continue with intermittent dysphagia though less frequent.  She had restarted Ozempic at a lower dose 4 months prior.  Elavil was also discontinued 3 weeks prior.  Continued with constipation, but this responded well to MiraLAX when she did take it.  Also stated abdominal pain improved when bowels are moving well.  She had recently been evaluated in the emergency room for the same symptoms.  No significant abnormalities on laboratory evaluation or CT A/P with contrast aside from persistent moderate size hiatal hernia with rotated but nondilated partially intrathoracic stomach.  It was felt her abdominal pain and upper GI symptoms were likely multifactorial in the setting of Ozempic, discontinuing Elavil/functional dyspepsia, possible gastroparesis, constipation, and queried intermittent volvulus.  Plan for upper GI series and advised patient to discuss stopping Ozempic with PCP.  For constipation, recommended MiraLAX daily.    Upper  GI series with esophageal dysmotility, moderate size hiatal hernia, stricture at GE junction obstructing barium tablet, additional narrowing of cervical esophagus due to combination of anterior mucosal web and proximal cricopharyngeus muscle, question mucosal fold thickening at the mid stomach at the level of the diaphragm versus artifact, focal gastritis not excluded.  Recommended EGD  Later completed gastric emptying study 12/29/2021 which was normal.  EGD 01/18/2022: Moderate size hiatal hernia, moderate Schatzki's ring dilated, gastritis biopsied.  Pathology with reactive gastropathy, negative for H. pylori.   Today:  Vomiting out of the blue. About once a month. No hematemesis or coffee ground emesis. Associated epigastric and right sided abdominal pain when she has the vomiting.  Otherwise, has some abdominal pain a couple days a week.  Feels this could be related to her anxiety.  States she has a lot of anxiety and has not been able to get this under control.  She also has depression in some days with feeling she is worse nothing.  No SI.  PCP has talked about referral to a psychiatrist.  Also has nausea 3 to 4 days a week that is random.  No specific trigger.  She is also having daily reflux despite taking Nexium 40 mg daily.  She is nervous about changing Nexium as she feels that this has worked the best for her in the past.  She is using Zofran or Phenergan to help with her nausea, but takes it sparingly.  Swallowing improved after her last EGD, but she has had some recurrent dysphagia to pills and meats.  She gets some associated burning in her esophagus when this happens.  She is on Ozempic.  States this  was discontinued for a time, but did not notice any improvement in her GI symptoms when it was stopped.  Appetite fluctuates.  States she does not have an appetite, then suddenly she wants to eat everything.  Overall, her weight has been fairly stable.  Needs a colonoscopy. Never had one.   Has had some blood on toilet tissue. Last occurred about 4 weeks ago. Started about 6 months. Occurs every few weeks. Has hard stools. Not sure about hemorrhoids. Takes MiraLAX every few days. Helps when she takes it. Abdominal pain is better when bowels move well.    She has evidence of recurrent strokes on brain MRI and has been started on Plavix. She has followed up with neurology who did not make any additional recommendations but recommended she continue Plavix.    Past Medical History:  Diagnosis Date   Angina at rest    Anxiety    Asthma    Caregiver stress 07/04/2011   Chronic pain syndrome    Degenerative disc disease, cervical    Depression    Essential hypertension 1998   GERD (gastroesophageal reflux disease)    Gout    Hiatal hernia    History of pancreatitis 06/08/2005   History of stroke    September 2020   N&V (nausea and vomiting) 04/13/2021   Pelvic mass    Renal insufficiency    Seasonal allergies    Sinusitis, chronic    Stroke ([place])    Transfusion history    Type 2 diabetes mellitus ([place]) 1998    Past Surgical History:  Procedure Laterality Date   APPENDECTOMY     ARM DEBRIDEMENT Left    debridement of axilla "spider bite"   BALLOON DILATION N/A 06/30/2021   Procedure: BALLOON DILATION;  Surgeon: Eloise Harman, DO;  [facility];  Service: Endoscopy;  Laterality: N/A;   BALLOON DILATION N/A 01/18/2022   Procedure: BALLOON DILATION;  Surgeon: Eloise Harman, DO;  [facility];  Service: Endoscopy;  Laterality: N/A;   BIOPSY  06/30/2021   Procedure: BIOPSY;  Surgeon: Eloise Harman, DO;  [facility];  Service: Endoscopy;;   BIOPSY  01/18/2022   Procedure: BIOPSY;  Surgeon: Eloise Harman, DO;  [facility];  Service: Endoscopy;;   BREAST SURGERY     breast cyst x2 -last '12   CATARACT EXTRACTION W/PHACO Left 11/25/2021   Procedure: CATARACT EXTRACTION PHACO AND INTRAOCULAR LENS PLACEMENT ([place]);   Surgeon: Baruch Goldmann, MD;  Location: [facility];  Service: Ophthalmology;  Laterality: Left;  CDE 4.11   CATARACT EXTRACTION W/PHACO Right 01/06/2022   Procedure: CATARACT EXTRACTION PHACO AND INTRAOCULAR LENS PLACEMENT ([facility]);  Surgeon: Baruch Goldmann, MD;  Location: [facility];  Service: Ophthalmology;  Laterality: Right;  CDE 4.97   CHOLECYSTECTOMY N/A 08/11/2015   Procedure: LAPAROSCOPIC CHOLECYSTECTOMY;  Surgeon: Arta Bruce Kinsinger, MD;  Location: [facility];  Service: General;  Laterality: N/A;   ESOPHAGOGASTRODUODENOSCOPY (EGD) WITH PROPOFOL N/A 06/30/2021   Surgeon: Eloise Harman, DO;   Moderate size hiatal hernia, moderate Schatzki's ring dilated, gastritis biopsied, normal examined duodenum.  Pathology with gastric antral and oxyntic mucosa with nonspecific reactive gastropathy, negative for H. pylori.   ESOPHAGOGASTRODUODENOSCOPY (EGD) WITH PROPOFOL N/A 01/18/2022   Procedure: ESOPHAGOGASTRODUODENOSCOPY (EGD) WITH PROPOFOL;  Surgeon: Eloise Harman, DO;  [facility];  Service: Endoscopy;  Laterality: N/A;  1:15 pm   TONSILLECTOMY     TONSILLECTOMY AND ADENOIDECTOMY  TUBAL LIGATION     post partum    Current Outpatient Medications  Medication Sig Dispense Refill   ADVAIR HFA 115-21 MCG/ACT inhaler INHALE 2 PUFFS INTO LUNGS TWICE DAILY (Patient taking differently: 2 puffs 2 (two) times daily as needed (respiratory issues.).) 12 Inhaler 0   albuterol (PROVENTIL) (2.5 MG/3ML) 0.083% nebulizer solution USE ONE VIAL IN NEBULIZER EVERY 6 HOURS AS NEEDED FOR WHEEZING OR SHORTNESS OF BREATH 75 mL 0   albuterol (VENTOLIN HFA) 108 (90 Base) MCG/ACT inhaler INHALE ONE TO TWO PUFFS BY MOUTH EVERY 6 HOURS AS NEEDED FOR WHEEZING OR SHORTNESS OF BREATH (OR PERSISTENT COUGHING) 18 each 2   Alcohol Swabs PADS Use to check blood sugar daily. E11.9 100 each 3   ALPRAZolam (XANAX) 1 MG tablet Take 0.5-1 mg by mouth See admin instructions. Take 1 tablet (1 mg) by mouth (scheduled) in the  morning at bedtime & may take 0.5 mg additional if needed for anxiety.     ARIPiprazole (ABILIFY) 5 MG tablet Take 5 mg by mouth at bedtime.     ascorbic acid (VITAMIN C) 500 MG tablet Take 500 mg by mouth daily.     blood glucose meter kit and supplies KIT Dispense based on patient and insurance preference. Use once a day.  DX: E11.9. 1 each 0   blood glucose meter kit and supplies Dispense based on patient and insurance preference. Use up to four times daily as directed. (FOR ICD-10 E11.9). 1 each 0   Blood Glucose Monitoring Suppl (FREESTYLE LITE) DEVI      cholecalciferol (VITAMIN D3) 25 MCG (1000 UNIT) tablet Take 1,000 Units by mouth daily.     clopidogrel (PLAVIX) 75 MG tablet Take 75 mg by mouth daily.     diphenhydrAMINE (BENADRYL) 25 MG tablet Take 25 mg by mouth at bedtime.     estradiol (VIVELLE-DOT) 0.075 MG/24HR Place 1 patch onto the skin 2 (two) times a week. Typically Tuesdays & Thursdays     FLUoxetine (PROZAC) 40 MG capsule Take 80 mg by mouth every evening.     fluticasone (FLONASE) 50 MCG/ACT nasal spray Place 2 sprays into both nostrils daily as needed for allergies.     Garlic (GARLIQUE) [id number] MG TBEC Take 400 mg by mouth in the morning and at bedtime.     glucose blood test strip Test blood sugar daily.  DX: E11.9 100 each 2   glucose monitoring kit (FREESTYLE) monitoring kit Test blood sugar daily. DX: E11.9 1 each 0   HYDROcodone-acetaminophen (NORCO) 10-325 MG per tablet Take 1 tablet by mouth 4 (four) times daily as needed for moderate pain or severe pain.     Lancet Devices (LANCING DEVICE) MISC Use to test blood sugar daily. E11.9 1 each 0   Lancets MISC Test blood sugar daily. DX: E11.9 100 each 2   magnesium oxide (MAG-OX) 400 MG tablet Take 400 mg by mouth daily.     medroxyPROGESTERone (PROVERA) 2.5 MG tablet TAKE ONE TABLET BY MOUTH ONCE DAILY (Patient taking differently: Take 2.5 mg by mouth See admin instructions. Take 1 tablet (2.5 mg) by mouth in the evening  for 3 weeks, then off 1 week cyclically) 90 tablet 0   Menthol, Topical Analgesic, (BIOFREEZE) 4 % GEL Apply 1 application  topically as needed (pain).     ondansetron (ZOFRAN) 8 MG tablet Take 1 tablet (8 mg total) by mouth every 8 (eight) hours as needed for nausea. 24 tablet 1   pantoprazole (PROTONIX) 40 MG  tablet Take 1 tablet (40 mg total) by mouth 2 (two) times daily before a meal. 60 tablet 3   polyethylene glycol (MIRALAX / GLYCOLAX) 17 g packet Take 17 g by mouth daily.     Propylene Glycol (SYSTANE BALANCE) 0.6 % SOLN Place 1 drop into both eyes in the morning, at noon, and at bedtime.     Semaglutide (OZEMPIC, 0.25 OR 0.5 MG/DOSE, [place]) Inject 0.5 mg into the skin once a week.     carvedilol (COREG CR) 40 MG 24 hr capsule Take 1 capsule (40 mg total) by mouth daily. (Patient not taking: Reported on 07/19/2022) 90 capsule 3   EPINEPHrine (EPIPEN 2-PAK) 0.3 mg/0.3 mL IJ SOAJ injection Inject 0.3 mLs (0.3 mg total) into the muscle once. (Patient not taking: Reported on 07/19/2022) 1 Device 1   meclizine (ANTIVERT) 25 MG tablet Take 25 mg by mouth 3 (three) times daily as needed for dizziness or nausea. (Patient not taking: Reported on 07/19/2022)     promethazine (PHENERGAN) 12.5 MG tablet Take 2 tablets (25 mg total) by mouth every 8 (eight) hours as needed for nausea or vomiting. (Patient not taking: Reported on 07/19/2022) 8 tablet 0   tiZANidine (ZANAFLEX) 4 MG tablet Take 4 mg by mouth every 6 (six) hours as needed for muscle spasms. (Patient not taking: Reported on 07/19/2022)     No current facility-administered medications for this visit.    Allergies as of 07/19/2022 - Review Complete 07/19/2022  Allergen Reaction Noted   Lyrica [pregabalin]  01/22/2020   Amlodipine besylate Other (See Comments)    Atorvastatin Other (See Comments)    Losartan potassium-hctz Nausea Only 02/28/2011   Morphine and related Other (See Comments) 08/11/2015   Niacin Other (See Comments) 10/12/2006    Rosuvastatin Other (See Comments)    Statins  05/11/2021   Toprol xl [metoprolol succinate] Other (See Comments) 02/28/2011   Vytorin [ezetimibe-simvastatin] Other (See Comments)    Latex Rash 06/06/2011   Valium [diazepam] Anxiety 04/12/2015    Family History  Problem Relation Age of Onset   Hypertension Mother    Depression Mother    Stomach cancer Mother 69   Hypertension Brother    Pancreatic cancer Brother    Diabetes Maternal Grandmother    Hypertension Maternal Grandmother    Breast cancer Maternal Grandmother    Heart attack Maternal Grandmother    Diabetes Maternal Aunt    Breast cancer Maternal Aunt    Diabetes Maternal Uncle    Hypertension Maternal Uncle    Hypertension Maternal Uncle    Stroke Other    Colon cancer Neg Hx     Social History   Socioeconomic History   Marital status: Single    Spouse name: Not on file   Number of children: Not on file   Years of education: Not on file   Highest education level: Not on file  Occupational History   Occupation: Retired  Tobacco Use   Smoking status: Never   Smokeless tobacco: Never  Vaping Use   Vaping Use: Never used  Substance and Sexual Activity   Alcohol use: No   Drug use: No   Sexual activity: Yes    Birth control/protection: Post-menopausal  Other Topics Concern   Not on file  Social History Narrative   Not on file   Social Determinants of Health   Financial Resource Strain: Not on file  Food Insecurity: Not on file  Transportation Needs: Not on file  Physical Activity: Not on  file  Stress: Not on file  Social Connections: Not on file    Review of Systems: Gen: Denies fever, chills, anorexia. Denies fatigue, weakness, weight loss.  CV: Denies chest pain, palpitations, syncope, peripheral edema, and claudication. Resp: Denies dyspnea at rest, cough, wheezing, coughing up blood, and pleurisy. GI: Denies vomiting blood, jaundice, and fecal incontinence.   Denies dysphagia or  odynophagia. Derm: Denies rash, itching, dry skin Psych: Denies depression, anxiety, memory loss, confusion. No homicidal or suicidal ideation.  Heme: Denies bruising, bleeding, and enlarged lymph nodes.  Physical Exam: BP (!) 148/85 (BP Location: Left Arm, Patient Position: Sitting, Cuff Size: Normal)   Pulse 81   Temp (!) 96.9 F (36.1 C) (Temporal)   Ht 5' 4.5" (1.638 m)   Wt 144 lb (65.3 kg)   LMP 03/15/2014   SpO2 98%   BMI 24.34 kg/m  General:   Alert and oriented. No distress noted. Pleasant and cooperative.  Head:  Normocephalic and atraumatic. Eyes:  Conjuctiva clear without scleral icterus. Heart:  S1, S2 present without murmurs appreciated. Lungs:  Clear to auscultation bilaterally. No wheezes, rales, or rhonchi. No distress.  Abdomen:  +BS, soft, non-distended.  Fairly significant tenderness to palpation in the right upper quadrant.  No rebound or guarding. No HSM or masses noted. Msk:  Symmetrical without gross deformities. Normal posture. Extremities:  Without edema. Neurologic:  Alert and  oriented x4 Psych:  Normal mood and affect.    Assessment:  72 year old female with history of anxiety/depression, chronic pain, HTN, recurrent strokes now on Plavix (started around December 2023), diabetes, renal insufficiency, GERD, dysphagia with Schatzki's ring and esophageal dysmotility, chronic abdominal pain, chronic nausea/vomiting, chronic constipation, presenting today for follow-up with multiple GI concerns.  Abdominal pain/nausea/vomiting: Chronic history of intermittent generalized abdominal pain and nausea, likely multifactorial in the setting of uncontrolled anxiety, chronic uncontrolled constipation, chronic GERD, currently uncontrolled, and moderate sized hiatal hernia. However, she is reporting intermittent spells of epigastric and RUQ abdominal pain with associated vomiting that occurs out of the blue about once a month.  Not triggered by any specific foods.  On  exam, patient has fairly significant tenderness in the right upper quadrant.  History of cholecystectomy. Last EGD September 2023 with moderate size hiatal hernia, moderate Schatzki's ring dilated, gastritis with benign biopsies. GES normal in August 2023.    Etiology of her intermittent epigastric/RUQ pain with vomiting is not clear. We will focus on managing her GERD and constipation, update labs, arrange [place] to evaluate for biliary cause, and plan for an EGD as well in the near future pending discussion with Dr. Abbey Chatters regarding recurrent strokes, now on Plavix. I have also recommended follow-up with PCP regarding anxiety, consider referral to psychiatry and consider resuming Elavil.  GERD: Chronic.  Not adequately managed on Nexium 40 mg daily.  Suspect moderate size of the hernia is likely contributing to uncontrolled symptoms. Patient is hesitant to change from Nexium, but discussed interactions with Plavix.  We will stop Nexium and start pantoprazole 40 mg twice daily.    Dysphagia: Multifactorial in the setting of prior Schatzki's ring and esophageal dysmotility.  Patient reports improvement in symptoms after esophageal dilation in September 2023, but now with recurrent trouble swallowing pills and meats.  Associated burning in her esophagus.  She very well may have recurrent Schatzki's ring in the setting of uncontrolled GERD.  We will plan for an EGD in the near future pending discussion with Dr. Abbey Chatters regarding recurrent strokes, now on Plavix.  Constipation: Chronic.  Chronic pain medications contributing to uncontrolled symptoms. Not adequately managed on MiraLAX as needed though she does respond to [facility] when she takes it.  Recommended MiraLAX daily.  She will let me know if she continues to struggle with constipation.  Rectal bleeding: 29-month history of intermittent but fairly rare toilet tissue hematochezia.  Possibly secondary to benign anorectal source such as hemorrhoids in the  setting of chronic constipation, but unable to rule out colon polyps or malignancy as she has never had a colonoscopy.  We are addressing constipation.  We will plan to schedule a colonoscopy in the near future pending discussion with Dr. Abbey Chatters regarding recurrent strokes, now on Plavix.  Recurrent Strokes:  Patient has had recurrent strokes, now on Plavix, started in December 2023.  Most recent MRI of the brain 05/29/2022 with nearly completely resolved signal abnormality in the left cerebellar hemisphere most consistent with a now chronic infarct, new 4 mm focus of restricted diffusion in the corpus callosum consistent with an acute/early subacute infarct, extensive chronic small vessel ischemic disease.  She is currently asymptomatic without focal deficit or weakness.      Plan:  RUQ ultrasound CBC, CMP I will discuss timing of EGD +/- dilation and colonoscopy as well as a possibility of procedures while on Plavix with Dr. Abbey Chatters.  Stop Nexium and start pantoprazole 40 mg twice daily 30 minutes before breakfast and dinner. Reinforced GERD diet/lifestyle.  Separate instructions provided on AVS. Continue Zofran or Phenergan as needed. Take MiraLAX 17 g daily in 8 ounces of water.  Requested patient to call if she continues to struggle with constipation. Recommended for patient to follow-up with PCP on anxiety/depression.  Advised to discuss the possibility of resuming Eliquis to see if this helps with any of her GI symptoms. Follow-up date TBD.  If able to proceed with EGD and colonoscopy, we will follow-up after procedures.   Aliene Altes, PA-C [facility] 07/19/2022

## 2022-07-19 ENCOUNTER — Encounter: Payer: Self-pay | Admitting: Gastroenterology

## 2022-07-19 ENCOUNTER — Ambulatory Visit (INDEPENDENT_AMBULATORY_CARE_PROVIDER_SITE_OTHER): Payer: 59 | Admitting: Gastroenterology

## 2022-07-19 ENCOUNTER — Telehealth: Payer: Self-pay | Admitting: *Deleted

## 2022-07-19 VITALS — BP 148/85 | HR 81 | Temp 96.9°F | Ht 64.5 in | Wt 144.0 lb

## 2022-07-19 DIAGNOSIS — R1011 Right upper quadrant pain: Secondary | ICD-10-CM

## 2022-07-19 DIAGNOSIS — K625 Hemorrhage of anus and rectum: Secondary | ICD-10-CM | POA: Diagnosis not present

## 2022-07-19 DIAGNOSIS — R1310 Dysphagia, unspecified: Secondary | ICD-10-CM | POA: Diagnosis not present

## 2022-07-19 DIAGNOSIS — R112 Nausea with vomiting, unspecified: Secondary | ICD-10-CM

## 2022-07-19 DIAGNOSIS — K5900 Constipation, unspecified: Secondary | ICD-10-CM

## 2022-07-19 DIAGNOSIS — K219 Gastro-esophageal reflux disease without esophagitis: Secondary | ICD-10-CM | POA: Diagnosis not present

## 2022-07-19 MED ORDER — PANTOPRAZOLE SODIUM 40 MG PO TBEC: 40.0000 mg | DELAYED_RELEASE_TABLET | Freq: Two times a day (BID) | ORAL | 3 refills | Status: DC

## 2022-07-19 NOTE — Telephone Encounter (Signed)
Pt informed of RUQ [place] scheduled for 08/01/22, arrive at 9:15 am to check in and NPO after midnight. Verbalized understanding.

## 2022-07-19 NOTE — Patient Instructions (Addendum)
Please have blood work completed [facility].  We will arrange you to have an ultrasound at [place].  I will discuss scheduling your upper endoscopy and colonoscopy with Dr. Abbey Chatters due to you recently starting Plavix and history of recent stroke.   Due to recently starting Plavix, will need to change your acid reflux medication from Nexium to pantoprazole.  Please start pantoprazole 40 mg twice daily 30 minutes before breakfast and dinner.  Follow a GERD diet:  Avoid fried, fatty, greasy, spicy, citrus foods. Avoid caffeine and carbonated beverages. Avoid chocolate. Try eating 4-6 small meals a day rather than 3 large meals. Do not eat within 3 hours of laying down. Prop head of bed up on wood or bricks to create a 6 inch incline.  Continue to use Zofran or Phenergan as needed for nausea/vomiting.  Take MiraLAX 17g daily in 8 oz of water.   Discuss your anxiety/depression with your primary care doctor.  Restarting Elavil would be something to consider to see if this helps with your GI symptoms.   Follow-up date TBD.   It was good to see you again today!   Aliene Altes, PA-C [facility]

## 2022-07-20 LAB — CBC WITH DIFFERENTIAL/PLATELET
Absolute Monocytes: 540 cells/uL (ref 200–950)
Basophils Absolute: 63 cells/uL (ref 0–200)
Basophils Relative: 0.7 %
Eosinophils Absolute: 162 cells/uL (ref 15–500)
Eosinophils Relative: 1.8 %
HCT: 41.5 % (ref 35.0–45.0)
Hemoglobin: 12.9 g/dL (ref 11.7–15.5)
Lymphs Abs: 1827 cells/uL (ref 850–3900)
MCH: 26.5 pg — ABNORMAL LOW (ref 27.0–33.0)
MCHC: 31.1 g/dL — ABNORMAL LOW (ref 32.0–36.0)
MCV: 85.4 fL (ref 80.0–100.0)
MPV: 10.9 fL (ref 7.5–12.5)
Monocytes Relative: 6 %
Neutro Abs: 6408 cells/uL (ref 1500–7800)
Neutrophils Relative %: 71.2 %
Platelets: 290 10*3/uL (ref 140–400)
RBC: 4.86 10*6/uL (ref 3.80–5.10)
RDW: 13.9 % (ref 11.0–15.0)
Total Lymphocyte: 20.3 %
WBC: 9 10*3/uL (ref 3.8–10.8)

## 2022-07-20 LAB — COMPLETE METABOLIC PANEL WITH GFR
AG Ratio: 1.5 (calc) (ref 1.0–2.5)
ALT: 18 U/L (ref 6–29)
AST: 32 U/L (ref 10–35)
Albumin: 4.3 g/dL (ref 3.6–5.1)
Alkaline phosphatase (APISO): 60 U/L (ref 37–153)
BUN/Creatinine Ratio: 11 (calc) (ref 6–22)
BUN: 16 mg/dL (ref 7–25)
CO2: 26 mmol/L (ref 20–32)
Calcium: 9.3 mg/dL (ref 8.6–10.4)
Chloride: 103 mmol/L (ref 98–110)
Creat: 1.46 mg/dL — ABNORMAL HIGH (ref 0.60–1.00)
Globulin: 2.9 g/dL (calc) (ref 1.9–3.7)
Glucose, Bld: 90 mg/dL (ref 65–99)
Potassium: 4.5 mmol/L (ref 3.5–5.3)
Sodium: 141 mmol/L (ref 135–146)
Total Bilirubin: 0.3 mg/dL (ref 0.2–1.2)
Total Protein: 7.2 g/dL (ref 6.1–8.1)
eGFR: 38 mL/min/{1.73_m2} — ABNORMAL LOW (ref >=60)

## 2022-07-21 ENCOUNTER — Encounter: Payer: Self-pay | Admitting: Gastroenterology

## 2022-07-28 ENCOUNTER — Ambulatory Visit: Payer: 59 | Admitting: Cardiology

## 2022-07-28 ENCOUNTER — Telehealth: Payer: Self-pay | Admitting: Gastroenterology

## 2022-07-28 DIAGNOSIS — Z8673 Personal history of transient ischemic attack (TIA), and cerebral infarction without residual deficits: Secondary | ICD-10-CM

## 2022-07-28 DIAGNOSIS — R1310 Dysphagia, unspecified: Secondary | ICD-10-CM

## 2022-07-28 NOTE — Progress Notes (Deleted)
Cardiology Office Note   Date:  07/28/2022   ID:  Dana Romero, DOB [birth year], MRN [id number]  PCP:  Percell Belt, DO  Cardiologist:  Dr. Domenic Polite    No chief complaint on file.     History of Present Illness: Dana Romero is a 72 y.o. female who presents for HTN and hx SVT.   history of HTN, HLD, CVA, SVT.  At preop she told them she had chest pain and didn't have any NTG. She said she was told she had angina in the past. She was given NTG by Dr. Orlinda Blalock many years ago. Negative myoview in 2012. She describes chest pain as sharp in center of her chest always at rest and eases in 1-2 min. 4 weeks ago she had an episode that lasted 3-4 min but had run out of NTG. She has a lot of GI symptoms.Has trouble swallowing, vomiting a lot.  No regular exercise because of frequent vertigo and fall risk.To start PT to get some strength in her legs.  No exertional chest pain. BP always runs high. Allergic to a lot of meds.   Neg myoview in 2012 and again in 2023   she was not taking meds approp so coreg switched to coreg 40 mg XR  her hydralazine was stopped.   She was last seen 07/04/21   Today    Past Medical History:  Diagnosis Date   Angina at rest    Anxiety    Asthma    Caregiver stress 07/04/2011   Chronic pain syndrome    Degenerative disc disease, cervical    Depression    Essential hypertension 1998   GERD (gastroesophageal reflux disease)    Gout    Hiatal hernia    History of pancreatitis 06/08/2005   History of stroke    September 2020   N&V (nausea and vomiting) 04/13/2021   Pelvic mass    Renal insufficiency    Seasonal allergies    Sinusitis, chronic    Stroke ([place])    Transfusion history    Type 2 diabetes mellitus ([place]) 1998    Past Surgical History:  Procedure Laterality Date   APPENDECTOMY     ARM DEBRIDEMENT Left    debridement of axilla "spider bite"   BALLOON DILATION N/A 06/30/2021   Procedure: BALLOON DILATION;  Surgeon: Eloise Harman, DO;   [facility];  Service: Endoscopy;  Laterality: N/A;   BALLOON DILATION N/A 01/18/2022   Procedure: BALLOON DILATION;  Surgeon: Eloise Harman, DO;  [facility];  Service: Endoscopy;  Laterality: N/A;   BIOPSY  06/30/2021   Procedure: BIOPSY;  Surgeon: Eloise Harman, DO;  [facility];  Service: Endoscopy;;   BIOPSY  01/18/2022   Procedure: BIOPSY;  Surgeon: Eloise Harman, DO;  [facility];  Service: Endoscopy;;   BREAST SURGERY     breast cyst x2 -last '12   CATARACT EXTRACTION W/PHACO Left 11/25/2021   Procedure: CATARACT EXTRACTION PHACO AND INTRAOCULAR LENS PLACEMENT ([place]);  Surgeon: Baruch Goldmann, MD;  Location: [facility];  Service: Ophthalmology;  Laterality: Left;  CDE 4.11   CATARACT EXTRACTION W/PHACO Right 01/06/2022   Procedure: CATARACT EXTRACTION PHACO AND INTRAOCULAR LENS PLACEMENT ([facility]);  Surgeon: Baruch Goldmann, MD;  Location: [facility];  Service: Ophthalmology;  Laterality: Right;  CDE 4.97   CHOLECYSTECTOMY N/A 08/11/2015   Procedure: LAPAROSCOPIC CHOLECYSTECTOMY;  Surgeon: Arta Bruce Kinsinger, MD;  Location: [facility];  Service:  General;  Laterality: N/A;   ESOPHAGOGASTRODUODENOSCOPY (EGD) WITH PROPOFOL N/A 06/30/2021   Surgeon: Eloise Harman, DO;   Moderate size hiatal hernia, moderate Schatzki's ring dilated, gastritis biopsied, normal examined duodenum.  Pathology with gastric antral and oxyntic mucosa with nonspecific reactive gastropathy, negative for H. pylori.   ESOPHAGOGASTRODUODENOSCOPY (EGD) WITH PROPOFOL N/A 01/18/2022   Procedure: ESOPHAGOGASTRODUODENOSCOPY (EGD) WITH PROPOFOL;  Surgeon: Eloise Harman, DO;  [facility];  Service: Endoscopy;  Laterality: N/A;  1:15 pm   TONSILLECTOMY     TONSILLECTOMY AND ADENOIDECTOMY     TUBAL LIGATION     post partum     Current Outpatient Medications  Medication Sig Dispense Refill   ADVAIR HFA 115-21 MCG/ACT inhaler INHALE 2 PUFFS INTO LUNGS TWICE DAILY  (Patient taking differently: 2 puffs 2 (two) times daily as needed (respiratory issues.).) 12 Inhaler 0   albuterol (PROVENTIL) (2.5 MG/3ML) 0.083% nebulizer solution USE ONE VIAL IN NEBULIZER EVERY 6 HOURS AS NEEDED FOR WHEEZING OR SHORTNESS OF BREATH 75 mL 0   albuterol (VENTOLIN HFA) 108 (90 Base) MCG/ACT inhaler INHALE ONE TO TWO PUFFS BY MOUTH EVERY 6 HOURS AS NEEDED FOR WHEEZING OR SHORTNESS OF BREATH (OR PERSISTENT COUGHING) 18 each 2   Alcohol Swabs PADS Use to check blood sugar daily. E11.9 100 each 3   ALPRAZolam (XANAX) 1 MG tablet Take 0.5-1 mg by mouth See admin instructions. Take 1 tablet (1 mg) by mouth (scheduled) in the morning at bedtime & may take 0.5 mg additional if needed for anxiety.     ARIPiprazole (ABILIFY) 5 MG tablet Take 5 mg by mouth at bedtime.     ascorbic acid (VITAMIN C) 500 MG tablet Take 500 mg by mouth daily.     blood glucose meter kit and supplies KIT Dispense based on patient and insurance preference. Use once a day.  DX: E11.9. 1 each 0   blood glucose meter kit and supplies Dispense based on patient and insurance preference. Use up to four times daily as directed. (FOR ICD-10 E11.9). 1 each 0   Blood Glucose Monitoring Suppl (FREESTYLE LITE) DEVI      carvedilol (COREG CR) 40 MG 24 hr capsule Take 1 capsule (40 mg total) by mouth daily. (Patient not taking: Reported on 07/19/2022) 90 capsule 3   cholecalciferol (VITAMIN D3) 25 MCG (1000 UNIT) tablet Take 1,000 Units by mouth daily.     clopidogrel (PLAVIX) 75 MG tablet Take 75 mg by mouth daily.     diphenhydrAMINE (BENADRYL) 25 MG tablet Take 25 mg by mouth at bedtime.     EPINEPHrine (EPIPEN 2-PAK) 0.3 mg/0.3 mL IJ SOAJ injection Inject 0.3 mLs (0.3 mg total) into the muscle once. (Patient not taking: Reported on 07/19/2022) 1 Device 1   estradiol (VIVELLE-DOT) 0.075 MG/24HR Place 1 patch onto the skin 2 (two) times a week. Typically Tuesdays & Thursdays     FLUoxetine (PROZAC) 40 MG capsule Take 80 mg by  mouth every evening.     fluticasone (FLONASE) 50 MCG/ACT nasal spray Place 2 sprays into both nostrils daily as needed for allergies.     Garlic (GARLIQUE) [id number] MG TBEC Take 400 mg by mouth in the morning and at bedtime.     glucose blood test strip Test blood sugar daily.  DX: E11.9 100 each 2   glucose monitoring kit (FREESTYLE) monitoring kit Test blood sugar daily. DX: E11.9 1 each 0   HYDROcodone-acetaminophen (NORCO) 10-325 MG per tablet Take 1 tablet by  mouth 4 (four) times daily as needed for moderate pain or severe pain.     Lancet Devices (LANCING DEVICE) MISC Use to test blood sugar daily. E11.9 1 each 0   Lancets MISC Test blood sugar daily. DX: E11.9 100 each 2   magnesium oxide (MAG-OX) 400 MG tablet Take 400 mg by mouth daily.     meclizine (ANTIVERT) 25 MG tablet Take 25 mg by mouth 3 (three) times daily as needed for dizziness or nausea. (Patient not taking: Reported on 07/19/2022)     medroxyPROGESTERone (PROVERA) 2.5 MG tablet TAKE ONE TABLET BY MOUTH ONCE DAILY (Patient taking differently: Take 2.5 mg by mouth See admin instructions. Take 1 tablet (2.5 mg) by mouth in the evening for 3 weeks, then off 1 week cyclically) 90 tablet 0   Menthol, Topical Analgesic, (BIOFREEZE) 4 % GEL Apply 1 application  topically as needed (pain).     ondansetron (ZOFRAN) 8 MG tablet Take 1 tablet (8 mg total) by mouth every 8 (eight) hours as needed for nausea. 24 tablet 1   pantoprazole (PROTONIX) 40 MG tablet Take 1 tablet (40 mg total) by mouth 2 (two) times daily before a meal. 60 tablet 3   polyethylene glycol (MIRALAX / GLYCOLAX) 17 g packet Take 17 g by mouth daily.     promethazine (PHENERGAN) 12.5 MG tablet Take 2 tablets (25 mg total) by mouth every 8 (eight) hours as needed for nausea or vomiting. (Patient not taking: Reported on 07/19/2022) 8 tablet 0   Propylene Glycol (SYSTANE BALANCE) 0.6 % SOLN Place 1 drop into both eyes in the morning, at noon, and at bedtime.     Semaglutide  (OZEMPIC, 0.25 OR 0.5 MG/DOSE, [place]) Inject 0.5 mg into the skin once a week.     tiZANidine (ZANAFLEX) 4 MG tablet Take 4 mg by mouth every 6 (six) hours as needed for muscle spasms. (Patient not taking: Reported on 07/19/2022)     No current facility-administered medications for this visit.    Allergies:   Lyrica [pregabalin], Amlodipine besylate, Atorvastatin, Losartan potassium-hctz, Morphine and related, Niacin, Rosuvastatin, Statins, Toprol xl [metoprolol succinate], Vytorin [ezetimibe-simvastatin], Latex, and Valium [diazepam]    Social History:  The patient  reports that she has never smoked. She has never used smokeless tobacco. She reports that she does not drink alcohol and does not use drugs.   Family History:  The patient's ***family history includes Breast cancer in her maternal aunt and maternal grandmother; Depression in her mother; Diabetes in her maternal aunt, maternal grandmother, and maternal uncle; Heart attack in her maternal grandmother; Hypertension in her brother, maternal grandmother, maternal uncle, maternal uncle, and mother; Pancreatic cancer in her brother; Stomach cancer (age of onset: 84) in her mother; Stroke in an other family member.    ROS:  General:no colds or fevers, no weight changes Skin:no rashes or ulcers HEENT:no blurred vision, no congestion CV:see HPI PUL:see HPI GI:no diarrhea constipation or melena, no indigestion GU:no hematuria, no dysuria MS:no joint pain, no claudication Neuro:no syncope, no lightheadedness Endo:no diabetes, no thyroid disease Wt Readings from Last 3 Encounters:  07/19/22 144 lb (65.3 kg)  01/18/22 145 lb (65.8 kg)  01/06/22 143 lb (64.9 kg)     PHYSICAL EXAM: VS:  LMP 03/15/2014  , BMI There is no height or weight on file to calculate BMI. General:Pleasant affect, NAD Skin:Warm and dry, brisk capillary refill HEENT:normocephalic, sclera clear, mucus membranes moist Neck:supple, no JVD, no bruits  Heart:S1S2 RRR  without  murmur, gallup, rub or click Lungs:clear without rales, rhonchi, or wheezes [id number], non tender, + BS, do not palpate liver spleen or masses Ext:no lower ext edema, 2+ pedal pulses, 2+ radial pulses Neuro:alert and oriented, MAE, follows commands, + facial symmetry    EKG:  EKG is ordered today. The ekg ordered today demonstrates ***   Recent Labs: 07/19/2022: ALT 18; BUN 16; Creat 1.46; Hemoglobin 12.9; Platelets 290; Potassium 4.5; Sodium 141    Lipid Panel    Component Value Date/Time   CHOL 224 (H) 05/26/2020 1117   TRIG 103 05/26/2020 1117   HDL 53 05/26/2020 1117   CHOLHDL 4.2 05/26/2020 1117   VLDL 21 05/26/2020 1117   LDLCALC 150 (H) 05/26/2020 1117   LDLDIRECT 133 (H) 05/09/2012 1414       Other studies Reviewed: Additional studies/ records that were reviewed today include: ***.   ASSESSMENT AND PLAN:  1.  ***   Current medicines are reviewed with the patient today.  The patient Has no concerns regarding medicines.  The following changes have been made:  See above Labs/ tests ordered today include:see above  Disposition:   FU:  see above  Signed, Cecilie Kicks, NP  07/28/2022 11:20 AM    [address], [place], [place], [place] Phone: [phone]; Fax: [phone]

## 2022-07-28 NOTE — Telephone Encounter (Signed)
Reviewed case with Dr. Abbey Chatters.  Stated we could perform colonoscopy while on Plavix.  He would be hesitant to stretch her esophagus while on Plavix.  Stated maybe we could hold off on EGD until she is safe to come off of Plavix.  Called to discuss with patient today.  After further discussion, she states she was confused about the doctors that she had seen and actually has not followed up with neurology.  She was never referred to a neurologist.  I have advised that we have her see neurology ASAP before proceeding with EGD and colonoscopy.  We will get their approval to proceed with procedures as well as their opinion on whether or not patient can safely hold Plavix.  Patient reports her reflux symptoms are improving with pantoprazole, but states her swallowing is really bothering her.  She feels like foods are getting stuck.  Advised we can arrange a swallowing study to reevaluate her symptoms which will help [place] determine how urgent her endoscopy is needed.  Tammy/Mindy:  Please place ASAP referral to neurology.  Dx: History of stroke/recurrent strokes. We will need clearance from neurology to proceed with EGD and colonoscopy and also need their opinion on whether or not patient would be safe to hold Plavix or when they feel she would be safe to hold Plavix for 5 days before procedures.   Please also arrange a barium pill esophagram to further evaluate dysphagia.

## 2022-07-31 NOTE — Telephone Encounter (Signed)
[facility] declined referral. Spoke with Cyril Mourning and I will send request to PCP

## 2022-07-31 NOTE — Telephone Encounter (Signed)
Asap referral placed to neuro.  BPE scheduled for 4/4, arrival 10:15am, npo 3 hrs prior.   Called pt, no answer, and VM full at this time. WCB

## 2022-07-31 NOTE — Addendum Note (Signed)
Addended by: Cheron Every on: 07/31/2022 09:24 AM   Modules accepted: Orders

## 2022-08-01 ENCOUNTER — Ambulatory Visit (HOSPITAL_COMMUNITY)
Admission: RE | Admit: 2022-08-01 | Discharge: 2022-08-01 | Disposition: A | Discharge status: Home / Self Care | Payer: 59 | Source: Ambulatory Visit | Attending: Gastroenterology | Admitting: Gastroenterology

## 2022-08-01 DIAGNOSIS — R112 Nausea with vomiting, unspecified: Secondary | ICD-10-CM | POA: Insufficient documentation

## 2022-08-01 DIAGNOSIS — R1011 Right upper quadrant pain: Secondary | ICD-10-CM | POA: Diagnosis present

## 2022-08-01 NOTE — Telephone Encounter (Signed)
Request faxed to PCP. 

## 2022-08-02 NOTE — Telephone Encounter (Signed)
Spoke with pt. She is aware of BPE appt details. Also aware sent clearance to PCP

## 2022-08-10 ENCOUNTER — Ambulatory Visit (HOSPITAL_COMMUNITY)
Admission: RE | Admit: 2022-08-10 | Discharge: 2022-08-10 | Disposition: A | Discharge status: Home / Self Care | Payer: 59 | Source: Ambulatory Visit | Attending: Gastroenterology | Admitting: Gastroenterology

## 2022-08-10 ENCOUNTER — Encounter: Payer: Self-pay | Admitting: *Deleted

## 2022-08-10 DIAGNOSIS — R1310 Dysphagia, unspecified: Secondary | ICD-10-CM | POA: Diagnosis present

## 2022-08-14 ENCOUNTER — Telehealth (INDEPENDENT_AMBULATORY_CARE_PROVIDER_SITE_OTHER): Payer: Self-pay | Admitting: Gastroenterology

## 2022-08-14 NOTE — Telephone Encounter (Signed)
Left message to return call. Gave [address] number

## 2022-08-14 NOTE — Telephone Encounter (Signed)
Pt left voicemail stating that she had labs done last week and is needing procedure done due to swallowing.  Per lab results-follow RUQ ultrasound.  Please advise. Thank you!

## 2022-08-14 NOTE — Telephone Encounter (Signed)
Pt returned call. Pt states she is not taking Plavix at this time. Has appt with provider next week. Informed pt that we would still need to get clearance from provider then we can move forward with EGD. Pt verbalized understanding.

## 2022-08-14 NOTE — Telephone Encounter (Signed)
Please see DG esophagus result note.  We are waiting on clearance/approval from patient's primary care provider regarding when patient would be safe to hold Plavix for her procedures.  For now:  Eat slowly, take small bites, chew thoroughly, drink plenty of liquids throughout meals.  Avoid trough textures All meats should be chopped finely.  If something gets hung in her esophagus and will not come up or go down, proceed to the emergency room.

## 2022-08-14 NOTE — Telephone Encounter (Signed)
Noted. We have also sent clearance request to her PCP. We will see what they say. If she hasn't heard anything back from [place] by the end of the week or first of next week, feel free to call back and follow-up.

## 2022-08-22 NOTE — Telephone Encounter (Signed)
BP was 164/110 when patient saw PCP. She was started on amlodipine. I agree BP needs to be improved. They plan to follow-up in 3 months. We do need clarification. Are they requesting we wait 3 months before we can proceed with EGD? We should be able to tell pretty quickly if the amlodipine is going to help with her BP. Would like to proceed with procedures soon if possible.

## 2022-08-22 NOTE — Telephone Encounter (Signed)
Will await to receive note back from PCP

## 2022-08-22 NOTE — Telephone Encounter (Signed)
Called PCP and was advised "EGD will be cleared as long as her blood pressure improves". OV note in care everywhere. Receptionists was going to send message back to provider to get clarification.

## 2022-08-23 NOTE — Telephone Encounter (Signed)
  What type of surgery is being performed? EGD  When is surgery scheduled? TBD  Medical clearance  Name of physician performing surgery?  Dr. Earnest Bailey [facility] Phone: [phone] Fax: [phone]  Anethesia type (none, local, MAC, general)? MAC

## 2022-08-30 NOTE — Telephone Encounter (Signed)
Yes, BP still uncontrolled. We will need to wait until this has improved.

## 2022-08-30 NOTE — Telephone Encounter (Signed)
See notes in care everywhere. Looks like she has still been having high BP readings

## 2022-09-01 ENCOUNTER — Other Ambulatory Visit: Payer: Self-pay | Admitting: Gastroenterology

## 2022-09-01 DIAGNOSIS — K219 Gastro-esophageal reflux disease without esophagitis: Secondary | ICD-10-CM

## 2022-10-03 NOTE — Telephone Encounter (Signed)
error 

## 2022-10-03 NOTE — Telephone Encounter (Signed)
Agree with OV prior to scheduling.

## 2022-10-03 NOTE — Telephone Encounter (Signed)
Pt called wanting to schedule colonoscopy/EGD. Pt says she has been holding off taking her Plavix to be able to schedule her procedures. She also says that she has been working with her PCP to get blood pressure under control. Advised pt that she needs an OV with provider. Transferred to front to make appt. FYI

## 2022-10-04 NOTE — Telephone Encounter (Signed)
noted 

## 2022-10-05 NOTE — Telephone Encounter (Signed)
Pt called in and scheduled an appt with Kristen to come in to discuss scheduling procedures.

## 2022-10-12 NOTE — Progress Notes (Signed)
Referring Provider: Mattie Marlin, DO Primary Care Physician:  Mattie Marlin, DO Primary GI Physician: Dr. Marletta Lor  Chief Complaint  Patient presents with   Colonoscopy    Colonoscopy screening and trouble swallowing     HPI:   Dana Romero is a 72 y.o. female with history of anxiety/depression, chronic pain, HTN, recurrent strokes now on Plavix (started around December 2023), diabetes, renal insufficiency, GERD, dysphagia with Schatzki's ring and esophageal dysmotility, chronic abdominal pain, chronic nausea/vomiting, chronic constipation, presenting today to discuss scheduling procedures.  Last seen in our office 07/19/2022 for multiple GI concerns including abdominal pain, nausea, vomiting, GERD, dysphagia, constipation, rectal bleeding.  GERD was not adequately managed on Nexium 40 mg daily.  Constipation not adequately managed on MiraLAX as needed.  Reported 6 months of intermittent toilet tissue hematochezia.  Continued with chronic abdominal pain.  Also reporting intermittent spells of epigastric and RUQ abdominal pain with associated vomiting occurring out of the blue about once a month without identified triggers.  It was felt that her chronic abdominal pain was multifactorial in the setting of uncontrolled anxiety, uncontrolled constipation, uncontrolled GERD, moderate size hiatal hernia.  Planned to change Nexium to pantoprazole 40 mg twice daily, start MiraLAX daily, update labs, arrange ultrasound, and plan for EGD and colonoscopy in the near future.  Also advised patient to follow-up with PCP on anxiety/depression and discussed the possibility of resuming Elavil to see if this would help with any of her GI symptoms.   Case discussed with Dr. Marletta Lor who stated he was hesitant to pursue esophageal dilation while on Plavix.  Recommended holding off on EGD until safe to come off Plavix.  She was scheduled for barium pill esophagram to evaluate her dysphagia as she was reporting  this was very bothersome to her.   CBC and CMP with no significant abnormalities aside from creatinine elevated at 1.46 which was consistent with her baseline.  RUQ ultrasound 08/01/2022 with mild dilation of CBD and mild intrahepatic duct dilation likely due to prior cholecystectomy.  Barium pill esophagram completed 08/10/2022 showing tapered narrowing at GE junction obstructing 12.5 mm barium tablet consistent with stricture.  Appearance did not seem consistent with achalasia.  Large hiatal hernia with approximately half of the stomach in the chest.  Significant esophageal dysmotility.  We also reached out to her primary care provider to obtain clearance to hold Plavix as patient was not following with neurology.  However, due to uncontrolled hypertension, PCP stated she would not be cleared until blood pressure improved.  Today: GERD is better with pantoprazole BID  as long as she avoids spicy foods. Flare of abdominal pain, nausea, and vomiting have also decreased.  Now only occurring about once a month.  Feels like something builds up in her stomach, does not want to go anywhere, then she gets sick.  Has been on Ozempic for 1.5 years, but feels that this medication did worsen her GI symptoms.  Dysphagia is worsening.  Occurring with solids and liquids.  Taking MiraLAX once a week because PCP did not want her to take it daily. Bowels moving twice a week. Would like to try prescription medication. Takes MiraALX a while to work.  No recent rectal bleeding.   No currently taking Plavix.   Past Medical History:  Diagnosis Date   Angina at rest    Anxiety    Asthma    Caregiver stress 07/04/2011   Chronic pain syndrome    Degenerative disc disease,  cervical    Depression    Essential hypertension 1998   GERD (gastroesophageal reflux disease)    Gout    Hiatal hernia    History of pancreatitis 06/08/2005   History of stroke    September 2020   N&V (nausea and vomiting) 04/13/2021    Pelvic mass    Renal insufficiency    Seasonal allergies    Sinusitis, chronic    Stroke (HCC)    Transfusion history    Type 2 diabetes mellitus (HCC) 1998    Past Surgical History:  Procedure Laterality Date   APPENDECTOMY     ARM DEBRIDEMENT Left    debridement of axilla "spider bite"   BALLOON DILATION N/A 06/30/2021   Procedure: BALLOON DILATION;  Surgeon: Lanelle Bal, DO;  [facility]: [facility];  Service: Endoscopy;  Laterality: N/A;   BALLOON DILATION N/A 01/18/2022   Procedure: BALLOON DILATION;  Surgeon: Lanelle Bal, DO;  [facility]: [facility];  Service: Endoscopy;  Laterality: N/A;   BIOPSY  06/30/2021   Procedure: BIOPSY;  Surgeon: Lanelle Bal, DO;  [facility];  Service: Endoscopy;;   BIOPSY  01/18/2022   Procedure: BIOPSY;  Surgeon: Lanelle Bal, DO;  [facility]: [facility];  Service: Endoscopy;;   BREAST SURGERY     breast cyst x2 -last '12   CATARACT EXTRACTION W/PHACO Left 11/25/2021   Procedure: CATARACT EXTRACTION PHACO AND INTRAOCULAR LENS PLACEMENT (IOC);  Surgeon: Fabio Pierce, MD;  Location: [facility];  Service: Ophthalmology;  Laterality: Left;  CDE 4.11   CATARACT EXTRACTION W/PHACO Right 01/06/2022   Procedure: CATARACT EXTRACTION PHACO AND INTRAOCULAR LENS PLACEMENT (IOC);  Surgeon: Fabio Pierce, MD;  Location: [facility];  Service: Ophthalmology;  Laterality: Right;  CDE 4.97   CHOLECYSTECTOMY N/A 08/11/2015   Procedure: LAPAROSCOPIC CHOLECYSTECTOMY;  Surgeon: De Blanch Kinsinger, MD;  Location: [facility];  Service: General;  Laterality: N/A;   ESOPHAGOGASTRODUODENOSCOPY (EGD) WITH PROPOFOL N/A 06/30/2021   Surgeon: Lanelle Bal, DO;   Moderate size hiatal hernia, moderate Schatzki's ring dilated, gastritis biopsied, normal examined duodenum.  Pathology with gastric antral and oxyntic mucosa with nonspecific reactive gastropathy, negative for H. pylori.   ESOPHAGOGASTRODUODENOSCOPY (EGD) WITH PROPOFOL N/A 01/18/2022    Procedure: ESOPHAGOGASTRODUODENOSCOPY (EGD) WITH PROPOFOL;  Surgeon: Lanelle Bal, DO;  [facility];  Service: Endoscopy;  Laterality: N/A;  1:15 pm   TONSILLECTOMY     TONSILLECTOMY AND ADENOIDECTOMY     TUBAL LIGATION     post partum    Current Outpatient Medications  Medication Sig Dispense Refill   albuterol (PROVENTIL) (2.5 MG/3ML) 0.083% nebulizer solution USE ONE VIAL IN NEBULIZER EVERY 6 HOURS AS NEEDED FOR WHEEZING OR SHORTNESS OF BREATH 75 mL 0   Alcohol Swabs PADS Use to check blood sugar daily. E11.9 100 each 3   ALPRAZolam (XANAX) 1 MG tablet Take 0.5-1 mg by mouth See admin instructions. Take 1 tablet (1 mg) by mouth (scheduled) in the morning at bedtime & may take 0.5 mg additional if needed for anxiety.     ARIPiprazole (ABILIFY) 5 MG tablet Take 5 mg by mouth at bedtime.     ascorbic acid (VITAMIN C) 500 MG tablet Take 500 mg by mouth daily.     blood glucose meter kit and supplies KIT Dispense based on patient and insurance preference. Use once a day.  DX: E11.9. 1 each 0   blood glucose meter kit and supplies Dispense based on patient and  insurance preference. Use up to four times daily as directed. (FOR ICD-10 E11.9). 1 each 0   Blood Glucose Monitoring Suppl (FREESTYLE LITE) DEVI      carvedilol (COREG CR) 40 MG 24 hr capsule Take 1 capsule (40 mg total) by mouth daily. 90 capsule 3   cholecalciferol (VITAMIN D3) 25 MCG (1000 UNIT) tablet Take 1,000 Units by mouth daily.     estradiol (VIVELLE-DOT) 0.075 MG/24HR Place 1 patch onto the skin 2 (two) times a week. Typically Tuesdays & Thursdays     FLUoxetine (PROZAC) 40 MG capsule Take 80 mg by mouth every evening.     fluticasone (FLONASE) 50 MCG/ACT nasal spray Place 2 sprays into both nostrils daily as needed for allergies.     Garlic (GARLIQUE) 400 MG TBEC Take 400 mg by mouth in the morning and at bedtime.     glucose blood test strip Test blood sugar daily.  DX: E11.9 100 each 2   glucose monitoring  kit (FREESTYLE) monitoring kit Test blood sugar daily. DX: E11.9 1 each 0   HYDROcodone-acetaminophen (NORCO) 10-325 MG per tablet Take 1 tablet by mouth 4 (four) times daily as needed for moderate pain or severe pain.     Lancet Devices (LANCING DEVICE) MISC Use to test blood sugar daily. E11.9 1 each 0   Lancets MISC Test blood sugar daily. DX: E11.9 100 each 2   magnesium oxide (MAG-OX) 400 MG tablet Take 400 mg by mouth daily.     medroxyPROGESTERone (PROVERA) 2.5 MG tablet TAKE ONE TABLET BY MOUTH ONCE DAILY (Patient taking differently: Take 2.5 mg by mouth See admin instructions. Take 1 tablet (2.5 mg) by mouth in the evening for 3 weeks, then off 1 week cyclically) 90 tablet 0   Menthol, Topical Analgesic, (BIOFREEZE) 4 % GEL Apply 1 application  topically as needed (pain).     ondansetron (ZOFRAN) 8 MG tablet Take 1 tablet (8 mg total) by mouth every 8 (eight) hours as needed for nausea. 24 tablet 1   pantoprazole (PROTONIX) 40 MG tablet TAKE 1 TABLET (40 MG TOTAL) BY MOUTH TWICE A DAY BEFORE MEALS 180 tablet 1   polyethylene glycol-electrolytes (NULYTELY) 420 g solution Take 4,000 mLs by mouth once for 1 dose. 4000 mL 0   Propylene Glycol (SYSTANE BALANCE) 0.6 % SOLN Place 1 drop into both eyes in the morning, at noon, and at bedtime.     Semaglutide (OZEMPIC, 0.25 OR 0.5 MG/DOSE, [place]) Inject 0.5 mg into the skin once a week.     ADVAIR HFA 115-21 MCG/ACT inhaler INHALE 2 PUFFS INTO LUNGS TWICE DAILY (Patient not taking: Reported on 10/13/2022) 12 Inhaler 0   albuterol (VENTOLIN HFA) 108 (90 Base) MCG/ACT inhaler INHALE ONE TO TWO PUFFS BY MOUTH EVERY 6 HOURS AS NEEDED FOR WHEEZING OR SHORTNESS OF BREATH (OR PERSISTENT COUGHING) (Patient not taking: Reported on 10/13/2022) 18 each 2   clopidogrel (PLAVIX) 75 MG tablet Take 75 mg by mouth daily. (Patient not taking: Reported on 10/13/2022)     diphenhydrAMINE (BENADRYL) 25 MG tablet Take 25 mg by mouth at bedtime. (Patient not taking: Reported on  10/13/2022)     EPINEPHrine (EPIPEN 2-PAK) 0.3 mg/0.3 mL IJ SOAJ injection Inject 0.3 mLs (0.3 mg total) into the muscle once. (Patient not taking: Reported on 07/19/2022) 1 Device 1   meclizine (ANTIVERT) 25 MG tablet Take 25 mg by mouth 3 (three) times daily as needed for dizziness or nausea. (Patient not taking: Reported on 07/19/2022)  No current facility-administered medications for this visit.    Allergies as of 10/13/2022 - Review Complete 10/13/2022  Allergen Reaction Noted   Lyrica [pregabalin]  01/22/2020   Amlodipine besylate Other (See Comments)    Atorvastatin Other (See Comments)    Losartan potassium-hctz Nausea Only 02/28/2011   Morphine and codeine Other (See Comments) 08/11/2015   Niacin Other (See Comments) 10/12/2006   Rosuvastatin Other (See Comments)    Statins  05/11/2021   Toprol xl [metoprolol succinate] Other (See Comments) 02/28/2011   Vytorin [ezetimibe-simvastatin] Other (See Comments)    Latex Rash 06/06/2011   Valium [diazepam] Anxiety 04/12/2015    Family History  Problem Relation Age of Onset   Hypertension Mother    Depression Mother    Stomach cancer Mother 51   Hypertension Brother    Pancreatic cancer Brother    Diabetes Maternal Grandmother    Hypertension Maternal Grandmother    Breast cancer Maternal Grandmother    Heart attack Maternal Grandmother    Diabetes Maternal Aunt    Breast cancer Maternal Aunt    Diabetes Maternal Uncle    Hypertension Maternal Uncle    Hypertension Maternal Uncle    Stroke Other    Colon cancer Neg Hx     Social History   Socioeconomic History   Marital status: Single    Spouse name: Not on file   Number of children: Not on file   Years of education: Not on file   Highest education level: Not on file  Occupational History   Occupation: Retired  Tobacco Use   Smoking status: Never   Smokeless tobacco: Never  Vaping Use   Vaping Use: Never used  Substance and Sexual Activity   Alcohol use: No    Drug use: No   Sexual activity: Yes    Birth control/protection: Post-menopausal  Other Topics Concern   Not on file  Social History Narrative   Not on file   Social Determinants of Health   Financial Resource Strain: Not on file  Food Insecurity: Not on file  Transportation Needs: Not on file  Physical Activity: Not on file  Stress: Not on file  Social Connections: Not on file    Review of Systems: Gen: Denies fever, chills, cold or flulike symptoms, presyncope, syncope. CV: Denies chest pain, palpitations. Resp: Denies dyspnea, cough. GI: See HPI Heme: See HPI  Physical Exam: BP 139/80 (BP Location: Right Arm, Patient Position: Sitting, Cuff Size: Normal)   Pulse 71   Temp 97.9 F (36.6 C) (Temporal)   Ht 5\' 3"  (1.6 m)   Wt 142 lb 9.6 oz (64.7 kg)   LMP 03/15/2014   SpO2 99%   BMI 25.26 kg/m  General:   Alert and oriented. No distress noted. Pleasant and cooperative.  Head:  Normocephalic and atraumatic. Eyes:  Conjuctiva clear without scleral icterus. Heart:  S1, S2 present without murmurs appreciated. Lungs:  Clear to auscultation bilaterally. No wheezes, rales, or rhonchi. No distress.  Abdomen:  +BS, soft,  and non-distended.  Mild to moderate TTP in RUQ region.  Slight TTP in LLQ.  No rebound or guarding. No HSM or masses noted. Msk:  Symmetrical without gross deformities. Normal posture. Extremities:  Without edema. Neurologic:  Alert and  oriented x4 Psych:  Normal mood and affect.    Assessment:  72 y.o. female with history of anxiety/depression, chronic pain, HTN, recurrent strokes now on Plavix (started around December 2023), diabetes, renal insufficiency, GERD, dysphagia with Schatzki's ring and  esophageal dysmotility, chronic abdominal pain, chronic nausea/vomiting, chronic constipation, presenting today for follow-up of multiple GI concerns and to discuss scheduling colonoscopy and EGD.   Abdominal pain/nausea/vomiting: Chronic history of  intermittent generalized abdominal pain and nausea, likely multifactorial in the setting of uncontrolled anxiety, chronic uncontrolled constipation, chronic GERD, and large hiatal hernia. Also with intermittent flares of RUQ/epigastric abdominal pain with vomiting about once a month. States she feels  something builds up in her stomach, does not want to go anywhere, then she gets sick. Query gastroparesis vs gastritis, PUD, or medication effect secondary to Ozempic.  On exam, she does have mild to moderate tenderness in the RUQ as well as slight tenderness in LLQ.  History of cholecystectomy. Last EGD September 2023 with moderate size hiatal hernia, moderate Schatzki's ring dilated, gastritis with benign biopsies. GES normal in August 2023.  RUQ ultrasound March 2024 with mild CBD and intrahepatic duct dilation likely secondary to prior cholecystectomy and chronic pain medications.  Labs at that time with normal LFTs.  Etiology of her intermittent epigastric/RUQ pain with vomiting is not clear. We will pursue EGD for further evaluation.   GERD: Much improved with changing Nexium to Protonix 40 mg twice daily and avoiding trigger foods.  Dysphagia: Worsening.  Reports dysphagia to solids and liquids.  Barium pill esophagram April 2024 with tapered narrowing of GE junction obstructing 12.5 mm barium tablet consistent with stricture.  Also with large hiatal hernia with approximately half of the stomach in the chest, significant esophageal dysmotility.  We are pursuing EGD with dilation for further evaluation and to address esophageal stricture.  Constipation: Chronic.  Not adequately controlled with MiraLAX once weekly.  Patient would like to try prescription medication.  I will try her on Linzess 145 mcg daily.  Samples provided and requested she call back in 1 week with a progress report.  Rectal bleeding:  No current rectal bleeding, but at her last visit in March, she reported 46-month history of  intermittent, fairly rare toilet tissue hematochezia.  Suspect this was secondary to benign anorectal source such as hemorrhoids in the setting of chronic, uncontrolled constipation.  However, she has never had a colonoscopy, so I am unable to rule out colon polyps or malignancy.  We are proceeding with first-ever colonoscopy in the near future.   Plan:  Proceed with colonoscopy with propofol by Dr. Marletta Lor in near future. The risks, benefits, and alternatives have been discussed with the patient in detail. The patient states understanding and desires to proceed.  ASA 3 Hold Plavix x 5 days prior to procedure (patient not currently taking at all) Hold Ozempic x 1 week.   Continue pantoprazole 40 mg twice daily. Reinforced GERD diet/lifestyle.  Written instructions provided on AVS. Avoid NSAIDs. Dysphagia precautions:  Eat slowly, take small bites, chew thoroughly, drink plenty of liquids throughout meals.  Avoid trough textures All meats should be chopped finely.  If something gets hung in your esophagus and will not come up or go down, proceed to the emergency room.   Stop Miralax Start Linzess 145 mcg daily 30 minutes before breakfast.  Samples provided.  Requested progress report in 1 week. Follow-up after procedures.   Ermalinda Memos, PA-C [facility] 10/13/2022

## 2022-10-13 ENCOUNTER — Encounter: Payer: Self-pay | Admitting: Gastroenterology

## 2022-10-13 ENCOUNTER — Ambulatory Visit (INDEPENDENT_AMBULATORY_CARE_PROVIDER_SITE_OTHER): Payer: 59 | Admitting: Gastroenterology

## 2022-10-13 ENCOUNTER — Other Ambulatory Visit: Payer: Self-pay | Admitting: *Deleted

## 2022-10-13 ENCOUNTER — Telehealth: Payer: Self-pay | Admitting: *Deleted

## 2022-10-13 ENCOUNTER — Encounter: Payer: Self-pay | Admitting: *Deleted

## 2022-10-13 VITALS — BP 139/80 | HR 71 | Temp 97.9°F | Ht 63.0 in | Wt 142.6 lb

## 2022-10-13 DIAGNOSIS — R1310 Dysphagia, unspecified: Secondary | ICD-10-CM | POA: Diagnosis not present

## 2022-10-13 DIAGNOSIS — R1084 Generalized abdominal pain: Secondary | ICD-10-CM

## 2022-10-13 DIAGNOSIS — K219 Gastro-esophageal reflux disease without esophagitis: Secondary | ICD-10-CM | POA: Diagnosis not present

## 2022-10-13 DIAGNOSIS — K5900 Constipation, unspecified: Secondary | ICD-10-CM

## 2022-10-13 DIAGNOSIS — K625 Hemorrhage of anus and rectum: Secondary | ICD-10-CM

## 2022-10-13 DIAGNOSIS — R112 Nausea with vomiting, unspecified: Secondary | ICD-10-CM

## 2022-10-13 MED ORDER — PEG 3350-KCL-NA BICARB-NACL 420 G PO SOLR: 4000.0000 mL | Freq: Once | ORAL | 0 refills | Status: AC

## 2022-10-13 NOTE — Telephone Encounter (Signed)
[employer] PA: Notification or Prior Authorization is not required for the requested services You are not required to submit a notification/prior authorization based on the information provided. If you have general questions about the prior authorization requirements, visit [email or web address] > Clinician Resources > Advance and Admission Notification Requirements. The number above acknowledges your notification. Please write this reference number down for future reference. If you would like to request an organization determination, please call us at [phone]. Decision ID #: [id number]

## 2022-10-13 NOTE — Patient Instructions (Addendum)
We will arrange for have an upper endoscopy with stretching of your esophagus and colonoscopy in the near future with Dr. Marletta Lor.  For reflux:  Continue taking pantoprazole 40 mg twice daily 30 minutes before breakfast and dinner.  Follow a GERD diet:  Avoid fried, fatty, greasy, spicy, citrus foods. Avoid caffeine and carbonated beverages. Avoid chocolate. Try eating 4-6 small meals a day rather than 3 large meals. Do not eat within 3 hours of laying down. Prop head of bed up on wood or bricks to create a 6 inch incline.  Avoid NSAID products.   Swallowing precautions:  Eat slowly, take small bites, chew thoroughly, drink plenty of liquids throughout meals.  Avoid trough textures All meats should be chopped finely.  If something gets hung in your esophagus and will not come up or go down, proceed to the emergency room.     For constipation: Stop MiraLAX.  Start Linzess 145 mcg daily 30 minutes before breakfast.  I am providing you with samples today.  Please call next week and let me know how you are doing.  If it works well, we will continue this dose.  If it is not strong enough, we do have a higher dose.  We will follow-up with you in the office after your procedures.   It was good to see you again today!  Ermalinda Memos, PA-C [facility]

## 2022-10-16 ENCOUNTER — Telehealth: Payer: Self-pay | Admitting: *Deleted

## 2022-10-16 NOTE — Telephone Encounter (Signed)
Pt called and given pre-op appointment for Wednesday, 11/08/22 at 1:15 pm. Go to [facility] and go through main entrance. Pt verbalized understanding.

## 2022-10-19 ENCOUNTER — Encounter: Payer: Self-pay | Admitting: Podiatrist

## 2022-10-19 ENCOUNTER — Ambulatory Visit (INDEPENDENT_AMBULATORY_CARE_PROVIDER_SITE_OTHER): Payer: 59 | Admitting: Podiatrist

## 2022-10-19 ENCOUNTER — Ambulatory Visit: Payer: 59 | Admitting: Podiatry

## 2022-10-19 DIAGNOSIS — M2042 Other hammer toe(s) (acquired), left foot: Secondary | ICD-10-CM

## 2022-10-19 DIAGNOSIS — M205X9 Other deformities of toe(s) (acquired), unspecified foot: Secondary | ICD-10-CM | POA: Diagnosis not present

## 2022-10-19 DIAGNOSIS — M2141 Flat foot [pes planus] (acquired), right foot: Secondary | ICD-10-CM

## 2022-10-19 DIAGNOSIS — E1140 Type 2 diabetes mellitus with diabetic neuropathy, unspecified: Secondary | ICD-10-CM | POA: Diagnosis not present

## 2022-10-19 DIAGNOSIS — M2142 Flat foot [pes planus] (acquired), left foot: Secondary | ICD-10-CM

## 2022-10-19 NOTE — Progress Notes (Signed)
Subjective: Dana Romero is a 72 y.o. female patient with history of diabetes who presents to office today for a diabetic foot evaluation and for evaluation for diabetic shoes.  Patient states that her last HgA1c was 6.1%.  she states she continues to have numbness to her feet and toes. She also relates her third to on the left foot is under lapping her left second toe.  She relates no pain due to numbness of the toes.   Her primary care provider is Dr. Adelene Amas.  Her last visit was 08/29/22  Current Outpatient Medications  Medication Instructions   ADVAIR HFA 115-21 MCG/ACT inhaler INHALE 2 PUFFS INTO LUNGS TWICE DAILY   albuterol (PROVENTIL) (2.5 MG/3ML) 0.083% nebulizer solution USE ONE VIAL IN NEBULIZER EVERY 6 HOURS AS NEEDED FOR WHEEZING OR SHORTNESS OF BREATH   albuterol (VENTOLIN HFA) 108 (90 Base) MCG/ACT inhaler INHALE ONE TO TWO PUFFS BY MOUTH EVERY 6 HOURS AS NEEDED FOR WHEEZING OR SHORTNESS OF BREATH (OR PERSISTENT COUGHING)   Alcohol Swabs PADS Use to check blood sugar daily. E11.9   ALPRAZolam (XANAX) 0.5-1 mg, Oral, See admin instructions, Take 1 tablet (1 mg) by mouth (scheduled) in the morning at bedtime & may take 0.5 mg additional if needed for anxiety.   ARIPiprazole (ABILIFY) 5 mg, Oral, Daily at bedtime   ascorbic acid (VITAMIN C) 500 mg, Oral, Daily   blood glucose meter kit and supplies KIT Dispense based on patient and insurance preference. Use once a day.  DX: E11.9.   blood glucose meter kit and supplies Dispense based on patient and insurance preference. Use up to four times daily as directed. (FOR ICD-10 E11.9).   Blood Glucose Monitoring Suppl (FREESTYLE LITE) DEVI No dose, route, or frequency recorded.   carvedilol (COREG CR) 40 mg, Oral, Daily   cholecalciferol (VITAMIN D3) 1,000 Units, Oral, Daily   clopidogrel (PLAVIX) 75 mg, Daily   diphenhydrAMINE (BENADRYL) 25 mg, Daily at bedtime   EPINEPHrine (EPIPEN 2-PAK) 0.3 mg, Intramuscular,  Once    estradiol (VIVELLE-DOT) 0.075 MG/24HR 1 patch, Transdermal, 2 times weekly, Typically Tuesdays & Thursdays   FLUoxetine (PROZAC) 80 mg, Oral, Every evening   fluticasone (FLONASE) 50 MCG/ACT nasal spray 2 sprays, Each Nare, Daily PRN   Garlique 400 mg, Oral, 2 times daily   glucose blood test strip Test blood sugar daily.  DX: E11.9   glucose monitoring kit (FREESTYLE) monitoring kit Test blood sugar daily. DX: E11.9   HYDROcodone-acetaminophen (NORCO) 10-325 MG per tablet 1 tablet, Oral, 4 times daily PRN   Lancet Devices (LANCING DEVICE) MISC Use to test blood sugar daily. E11.9   Lancets MISC Test blood sugar daily. DX: E11.9   magnesium oxide (MAG-OX) 400 mg, Oral, Daily   meclizine (ANTIVERT) 25 mg, 3 times daily PRN   medroxyPROGESTERone (PROVERA) 2.5 MG tablet TAKE ONE TABLET BY MOUTH ONCE DAILY   Menthol, Topical Analgesic, (BIOFREEZE) 4 % GEL 1 application , Topical, As needed   ondansetron (ZOFRAN) 8 mg, Oral, Every 8 hours PRN   pantoprazole (PROTONIX) 40 MG tablet TAKE 1 TABLET (40 MG TOTAL) BY MOUTH TWICE A DAY BEFORE MEALS   Propylene Glycol (SYSTANE BALANCE) 0.6 % SOLN 1 drop, Both Eyes, 3 times daily   Semaglutide (OZEMPIC, 0.25 OR 0.5 MG/DOSE, [place]) 0.5 mg, Subcutaneous, Weekly    Allergies  Allergen Reactions   Lyrica [Pregabalin]     Makes pt "out of it"   Amlodipine Besylate Other (See Comments)  Fatigued; "felt really bad"   Atorvastatin Other (See Comments)    Muscle/joint pain especially hip pain   Losartan Potassium-Hctz Nausea Only    fatigued   Morphine And Codeine Other (See Comments)    MIGRAINE   Niacin Other (See Comments)    flushing   Rosuvastatin Other (See Comments)    Muscle/joint pain especially hip pain   Statins     Hip pain   Toprol Xl [Metoprolol Succinate] Other (See Comments)    Makes her feel weak; fatigued   Vytorin [Ezetimibe-Simvastatin] Other (See Comments)    Muscle/joint pain especially hip pain   Latex Rash   Valium [Diazepam]  Anxiety    Has reverse effect on patient      Objective: General: Patient is awake, alert, and oriented x 3 and in no acute distress.  Integument: Skin is warm, dry and supple bilateral. Left hallux nail is significantly thick, discolored, dystrophic and clinically mycotic.  Remainder of the nails are also thick, brittle, discolored and mycotic with the left hallux being the most severe and symptomatic.  No interdigital macerations are noted.  No preulcerative lesions are seen.  Vasculature:  Dorsalis Pedis pulse 2/4 bilateral. Posterior Tibial pulse  1/4 bilateral.  Capillary fill time <3 sec 1-5 bilateral. Positive hair growth to the level of the digits. Temperature gradient within normal limits. No varicosities present bilateral. No edema present bilateral.   Neurology: The patient has absent sensation measured with a 5.07/10g Semmes Weinstein Monofilament 0/10 sites bilateral . Vibratory sensation diminished bilateral with tuning fork. No Babinski sign present bilateral.   Musculoskeletal: pes planus noted bilateral.  Decreased range of motion at the first metatarsal phalangeal joint bilaterally is noted.  Contracture left second toe with underlapping of the third toe and rigid deformity is noted. No tenderness with calf compression bilateral.  Assessment and Plan:   ICD-10-CM   1. Type 2 diabetes mellitus with diabetic neuropathy, unspecified whether long term insulin use (HCC)  E11.40     2. Pes planus of both feet  M21.41    M21.42     3. Hammertoe of left foot  M20.42     4. Hallux limitus, unspecified laterality  M20.5X9        -Examined patient. -Discussed and educated patient on diabetic foot care, especially with  regards to the vascular, neurological and musculoskeletal systems.  -did agree that diabetic shoes would be beneficial for her with her flat foot type. hammertoes and neuropathy. Will have her see our casting department for diabetic shoes and inserts.   -Patient advised to call the office if any problems or questions arise in the meantime.  Delories Heinz, DPM

## 2022-10-20 NOTE — Addendum Note (Signed)
Addended by: Delories Heinz on: 10/20/2022 07:45 AM   Modules accepted: Orders

## 2022-11-06 NOTE — Patient Instructions (Addendum)
Dana Romero  11/06/2022     @PREFPERIOPPHARMACY @   Your procedure is scheduled on  11/13/2022.   Report [facility] at  [phone]  A.M.   Call this number if you have problems the morning of surgery:  [phone]  If you experience any cold or flu symptoms such as cough, fever, chills, shortness of breath, etc. between now and your scheduled surgery, please notify us at the above number.   Remember:  Follow the diet and prep instructions given to you by the office.     Your last dose of semaglutide should be on 11/05/2022.      DO NOT take any medications for diabetes the morning of your procedure.     Take these medicines the morning of surgery with A SIP OF WATER         xanax(if needed), cardevilol, hydrocodone(if needed), zofran (if needed), pantoprazole.     Do not wear jewelry, make-up or nail polish, including gel polish,  artificial nails, or any other type of covering on natural nails (fingers and  toes).  Do not wear lotions, powders, or perfumes, or deodorant.  Do not shave 48 hours prior to surgery.  Men may shave face and neck.  Do not bring valuables to the hospital.  [facility] is not responsible for any belongings or valuables.  Contacts, dentures or bridgework may not be worn into surgery.  Leave your suitcase in the car.  After surgery it may be brought to your room.  For patients admitted to the hospital, discharge time will be determined by your treatment team.  Patients discharged the day of surgery will not be allowed to drive home and must have someone with them for 24 hours.    Special instructions:   DO NOT smoke tobacco or vape for 24 hours before your procedure.  Please read over the following fact sheets that you were given. Anesthesia Post-op Instructions and Care and Recovery After Surgery           Upper Endoscopy, Adult, Care After After the procedure, it is common to have a sore throat. It is also common to have: Mild  stomach pain or discomfort. Bloating. Nausea. Follow these instructions at home: The instructions below may help you care for yourself at home. Your health care provider may give you more instructions. If you have questions, ask your health care provider. If you were given a sedative during the procedure, it can affect you for several hours. Do not drive or operate machinery until your health care provider says that it is safe. If you will be going home right after the procedure, plan to have a responsible adult: Take you home from the hospital or clinic. You will not be allowed to drive. Care for you for the time you are told. Follow instructions from your health care provider about what you may eat and drink. Return to your normal activities as told by your health care provider. Ask your health care provider what activities are safe for you. Take over-the-counter and prescription medicines only as told by your health care provider. Contact a health care provider if you: Have a sore throat that lasts longer than one day. Have trouble swallowing. Have a fever. Get help right away if you: Vomit blood or your vomit looks like coffee grounds. Have bloody, black, or tarry stools. Have a very bad sore throat or you cannot swallow. Have difficulty breathing or very bad  pain in your chest or abdomen. These symptoms may be an emergency. Get help right away. Call 911. Do not wait to see if the symptoms will go away. Do not drive yourself to the hospital. Summary After the procedure, it is common to have a sore throat, mild stomach discomfort, bloating, and nausea. If you were given a sedative during the procedure, it can affect you for several hours. Do not drive until your health care provider says that it is safe. Follow instructions from your health care provider about what you may eat and drink. Return to your normal activities as told by your health care provider. This information is not  intended to replace advice given to you by your health care provider. Make sure you discuss any questions you have with your health care provider. Document Revised: 08/03/2021 Document Reviewed: 08/03/2021 Elsevier Patient Education  2024 [employer] Esophageal Dilatation Esophageal dilatation, also called esophageal dilation, is a procedure to widen or open a blocked or narrowed part of the esophagus. The esophagus is the part of the body that moves food and liquid from the mouth to the stomach. You may need this procedure if: You have a buildup of scar tissue in your esophagus that makes it difficult, painful, or impossible to swallow. This can be caused by gastroesophageal reflux disease (GERD). You have cancer of the esophagus. There is a problem with how food moves through your esophagus. In some cases, you may need this procedure repeated at a later time to dilate the esophagus gradually. Tell a health care provider about: Any allergies you have. All medicines you are taking, including vitamins, herbs, eye drops, creams, and over-the-counter medicines. Any problems you or family members have had with anesthetic medicines. Any blood disorders you have. Any surgeries you have had. Any medical conditions you have. Any antibiotic medicines you are required to take before dental procedures. Whether you are pregnant or may be pregnant. What are the risks? Generally, this is a safe procedure. However, problems may occur, including: Bleeding due to a tear in the lining of the esophagus. A hole, or perforation, in the esophagus. What happens before the procedure? Ask your health care provider about: Changing or stopping your regular medicines. This is especially important if you are taking diabetes medicines or blood thinners. Taking medicines such as aspirin and ibuprofen. These medicines can thin your blood. Do not take these medicines unless your health care provider tells you to take  them. Taking over-the-counter medicines, vitamins, herbs, and supplements. Follow instructions from your health care provider about eating or drinking restrictions. Plan to have a responsible adult take you home from the hospital or clinic. Plan to have a responsible adult care for you for the time you are told after you leave the hospital or clinic. This is important. What happens during the procedure? You may be given a medicine to help you relax (sedative). A numbing medicine may be sprayed into the back of your throat, or you may gargle the medicine. Your health care provider may perform the dilatation using various surgical instruments, such as: Simple dilators. This instrument is carefully placed in the esophagus to stretch it. Guided wire bougies. This involves using an endoscope to insert a wire into the esophagus. A dilator is passed over this wire to enlarge the esophagus. Then the wire is removed. Balloon dilators. An endoscope with a small balloon is inserted into the esophagus. The balloon is inflated to stretch the esophagus and open it up. The  procedure may vary among health care providers and hospitals. What can I expect after the procedure? Your blood pressure, heart rate, breathing rate, and blood oxygen level will be monitored until you leave the hospital or clinic. Your throat may feel slightly sore and numb. This will get better over time. You will not be allowed to eat or drink until your throat is no longer numb. When you are able to drink, urinate, and sit on the edge of the bed without nausea or dizziness, you may be able to return home. Follow these instructions at home: Take over-the-counter and prescription medicines only as told by your health care provider. If you were given a sedative during the procedure, it can affect you for several hours. Do not drive or operate machinery until your health care provider says that it is safe. Plan to have a responsible adult  care for you for the time you are told. This is important. Follow instructions from your health care provider about any eating or drinking restrictions. Do not use any products that contain nicotine or tobacco, such as cigarettes, e-cigarettes, and chewing tobacco. If you need help quitting, ask your health care provider. Keep all follow-up visits. This is important. Contact a health care provider if: You have a fever. You have pain that is not relieved by medicine. Get help right away if: You have chest pain. You have trouble breathing. You have trouble swallowing. You vomit blood. You have black, tarry, or bloody stools. These symptoms may represent a serious problem that is an emergency. Do not wait to see if the symptoms will go away. Get medical help right away. Call your local emergency services (911 in the [place] Do not drive yourself to the hospital. Summary Esophageal dilatation, also called esophageal dilation, is a procedure to widen or open a blocked or narrowed part of the esophagus. Plan to have a responsible adult take you home from the hospital or clinic. For this procedure, a numbing medicine may be sprayed into the back of your throat, or you may gargle the medicine. Do not drive or operate machinery until your health care provider says that it is safe. This information is not intended to replace advice given to you by your health care provider. Make sure you discuss any questions you have with your health care provider. Document Revised: 09/10/2019 Document Reviewed: 09/10/2019 Elsevier Patient Education  2024 [employer] Colonoscopy, Adult, Care After The following information offers guidance on how to care for yourself after your procedure. Your health care provider may also give you more specific instructions. If you have problems or questions, contact your health care provider. What can I expect after the procedure? After the procedure, it is common to have: A  small amount of blood in your stool for 24 hours after the procedure. Some gas. Mild cramping or bloating of your abdomen. Follow these instructions at home: Eating and drinking  Drink enough fluid to keep your urine pale yellow. Follow instructions from your health care provider about eating or drinking restrictions. Resume your normal diet as told by your health care provider. Avoid heavy or fried foods that are hard to digest. Activity Rest as told by your health care provider. Avoid sitting for a long time without moving. Get up to take short walks every 1-2 hours. This is important to improve blood flow and breathing. Ask for help if you feel weak or unsteady. Return to your normal activities as told by your health care provider. Ask  your health care provider what activities are safe for you. Managing cramping and bloating  Try walking around when you have cramps or feel bloated. If directed, apply heat to your abdomen as told by your health care provider. Use the heat source that your health care provider recommends, such as a moist heat pack or a heating pad. Place a towel between your skin and the heat source. Leave the heat on for 20-30 minutes. Remove the heat if your skin turns bright red. This is especially important if you are unable to feel pain, heat, or cold. You have a greater risk of getting burned. General instructions If you were given a sedative during the procedure, it can affect you for several hours. Do not drive or operate machinery until your health care provider says that it is safe. For the first 24 hours after the procedure: Do not sign important documents. Do not drink alcohol. Do your regular daily activities at a slower pace than normal. Eat soft foods that are easy to digest. Take over-the-counter and prescription medicines only as told by your health care provider. Keep all follow-up visits. This is important. Contact a health care provider if: You have  blood in your stool 2-3 days after the procedure. Get help right away if: You have more than a small spotting of blood in your stool. You have large blood clots in your stool. You have swelling of your abdomen. You have nausea or vomiting. You have a fever. You have increasing pain in your abdomen that is not relieved with medicine. These symptoms may be an emergency. Get help right away. Call 911. Do not wait to see if the symptoms will go away. Do not drive yourself to the hospital. Summary After the procedure, it is common to have a small amount of blood in your stool. You may also have mild cramping and bloating of your abdomen. If you were given a sedative during the procedure, it can affect you for several hours. Do not drive or operate machinery until your health care provider says that it is safe. Get help right away if you have a lot of blood in your stool, nausea or vomiting, a fever, or increased pain in your abdomen. This information is not intended to replace advice given to you by your health care provider. Make sure you discuss any questions you have with your health care provider. Document Revised: 06/06/2022 Document Reviewed: 12/15/2020 Elsevier Patient Education  2024 [employer] Monitored Anesthesia Care, Care After The following information offers guidance on how to care for yourself after your procedure. Your health care provider may also give you more specific instructions. If you have problems or questions, contact your health care provider. What can I expect after the procedure? After the procedure, it is common to have: Tiredness. Little or no memory about what happened during or after the procedure. Impaired judgment when it comes to making decisions. Nausea or vomiting. Some trouble with balance. Follow these instructions at home: For the time period you were told by your health care provider:  Rest. Do not participate in activities where you could fall  or become injured. Do not drive or use machinery. Do not drink alcohol. Do not take sleeping pills or medicines that cause drowsiness. Do not make important decisions or sign legal documents. Do not take care of children on your own. Medicines Take over-the-counter and prescription medicines only as told by your health care provider. If you were prescribed antibiotics, take them  as told by your health care provider. Do not stop using the antibiotic even if you start to feel better. Eating and drinking Follow instructions from your health care provider about what you may eat and drink. Drink enough fluid to keep your urine pale yellow. If you vomit: Drink clear fluids slowly and in small amounts as you are able. Clear fluids include water, ice chips, low-calorie sports drinks, and fruit juice that has water added to it (diluted fruit juice). Eat light and bland foods in small amounts as you are able. These foods include bananas, applesauce, rice, lean meats, toast, and crackers. General instructions  Have a responsible adult stay with you for the time you are told. It is important to have someone help care for you until you are awake and alert. If you have sleep apnea, surgery and some medicines can increase your risk for breathing problems. Follow instructions from your health care provider about wearing your sleep device: When you are sleeping. This includes during daytime naps. While taking prescription pain medicines, sleeping medicines, or medicines that make you drowsy. Do not use any products that contain nicotine or tobacco. These products include cigarettes, chewing tobacco, and vaping devices, such as e-cigarettes. If you need help quitting, ask your health care provider. Contact a health care provider if: You feel nauseous or vomit every time you eat or drink. You feel light-headed. You are still sleepy or having trouble with balance after 24 hours. You get a rash. You have a  fever. You have redness or swelling around the IV site. Get help right away if: You have trouble breathing. You have new confusion after you get home. These symptoms may be an emergency. Get help right away. Call 911. Do not wait to see if the symptoms will go away. Do not drive yourself to the hospital. This information is not intended to replace advice given to you by your health care provider. Make sure you discuss any questions you have with your health care provider. Document Revised: 09/19/2021 Document Reviewed: 09/19/2021 Elsevier Patient Education  2024 [employer].

## 2022-11-08 ENCOUNTER — Encounter (HOSPITAL_COMMUNITY)
Admission: RE | Admit: 2022-11-08 | Discharge: 2022-11-08 | Disposition: A | Discharge status: Home / Self Care | Payer: 59 | Source: Ambulatory Visit | Attending: Internal Medicine | Admitting: Internal Medicine

## 2022-11-08 ENCOUNTER — Encounter (HOSPITAL_COMMUNITY): Payer: Self-pay

## 2022-11-08 VITALS — BP 155/67 | HR 78 | Temp 97.8°F | Resp 18 | Ht 63.0 in | Wt 142.6 lb

## 2022-11-08 DIAGNOSIS — Z01818 Encounter for other preprocedural examination: Secondary | ICD-10-CM | POA: Diagnosis not present

## 2022-11-08 DIAGNOSIS — E1142 Type 2 diabetes mellitus with diabetic polyneuropathy: Secondary | ICD-10-CM | POA: Insufficient documentation

## 2022-11-08 DIAGNOSIS — I10 Essential (primary) hypertension: Secondary | ICD-10-CM | POA: Insufficient documentation

## 2022-11-08 LAB — BASIC METABOLIC PANEL
Anion gap: 11 (ref 5–15)
BUN: 20 mg/dL (ref 8–23)
CO2: 21 mmol/L — ABNORMAL LOW (ref 22–32)
Calcium: 8.8 mg/dL — ABNORMAL LOW (ref 8.9–10.3)
Chloride: 104 mmol/L (ref 98–111)
Creatinine, Ser: 1.34 mg/dL — ABNORMAL HIGH (ref 0.44–1.00)
GFR, Estimated: 42 mL/min — ABNORMAL LOW (ref >60)
Glucose, Bld: 154 mg/dL — ABNORMAL HIGH (ref 70–99)
Potassium: 5.1 mmol/L (ref 3.5–5.1)
Sodium: 136 mmol/L (ref 135–145)

## 2022-11-13 ENCOUNTER — Ambulatory Visit (HOSPITAL_COMMUNITY): Payer: 59 | Admitting: Anesthesiology

## 2022-11-13 ENCOUNTER — Other Ambulatory Visit: Payer: 59

## 2022-11-13 ENCOUNTER — Encounter (HOSPITAL_COMMUNITY)
Admission: RE | Disposition: A | Discharge status: Home / Self Care | Payer: Self-pay | Source: Home / Self Care | Attending: Internal Medicine

## 2022-11-13 ENCOUNTER — Ambulatory Visit (HOSPITAL_COMMUNITY)
Admission: RE | Admit: 2022-11-13 | Discharge: 2022-11-13 | Disposition: A | Discharge status: Home / Self Care | Payer: 59 | Attending: Internal Medicine | Admitting: Internal Medicine

## 2022-11-13 ENCOUNTER — Encounter (HOSPITAL_COMMUNITY): Payer: Self-pay

## 2022-11-13 ENCOUNTER — Ambulatory Visit (HOSPITAL_BASED_OUTPATIENT_CLINIC_OR_DEPARTMENT_OTHER): Payer: 59 | Admitting: Anesthesiology

## 2022-11-13 DIAGNOSIS — I209 Angina pectoris, unspecified: Secondary | ICD-10-CM

## 2022-11-13 DIAGNOSIS — Z1211 Encounter for screening for malignant neoplasm of colon: Secondary | ICD-10-CM | POA: Diagnosis not present

## 2022-11-13 DIAGNOSIS — Z7984 Long term (current) use of oral hypoglycemic drugs: Secondary | ICD-10-CM | POA: Insufficient documentation

## 2022-11-13 DIAGNOSIS — E119 Type 2 diabetes mellitus without complications: Secondary | ICD-10-CM | POA: Diagnosis not present

## 2022-11-13 DIAGNOSIS — K449 Diaphragmatic hernia without obstruction or gangrene: Secondary | ICD-10-CM | POA: Diagnosis not present

## 2022-11-13 DIAGNOSIS — R112 Nausea with vomiting, unspecified: Secondary | ICD-10-CM | POA: Diagnosis not present

## 2022-11-13 DIAGNOSIS — I10 Essential (primary) hypertension: Secondary | ICD-10-CM | POA: Insufficient documentation

## 2022-11-13 DIAGNOSIS — Z8673 Personal history of transient ischemic attack (TIA), and cerebral infarction without residual deficits: Secondary | ICD-10-CM | POA: Insufficient documentation

## 2022-11-13 DIAGNOSIS — K648 Other hemorrhoids: Secondary | ICD-10-CM | POA: Insufficient documentation

## 2022-11-13 DIAGNOSIS — K219 Gastro-esophageal reflux disease without esophagitis: Secondary | ICD-10-CM | POA: Insufficient documentation

## 2022-11-13 DIAGNOSIS — F32A Depression, unspecified: Secondary | ICD-10-CM | POA: Diagnosis not present

## 2022-11-13 DIAGNOSIS — K5730 Diverticulosis of large intestine without perforation or abscess without bleeding: Secondary | ICD-10-CM | POA: Insufficient documentation

## 2022-11-13 DIAGNOSIS — D123 Benign neoplasm of transverse colon: Secondary | ICD-10-CM | POA: Insufficient documentation

## 2022-11-13 DIAGNOSIS — D126 Benign neoplasm of colon, unspecified: Secondary | ICD-10-CM

## 2022-11-13 DIAGNOSIS — K2970 Gastritis, unspecified, without bleeding: Secondary | ICD-10-CM | POA: Diagnosis not present

## 2022-11-13 DIAGNOSIS — K222 Esophageal obstruction: Secondary | ICD-10-CM | POA: Insufficient documentation

## 2022-11-13 DIAGNOSIS — R1013 Epigastric pain: Secondary | ICD-10-CM | POA: Diagnosis not present

## 2022-11-13 DIAGNOSIS — F419 Anxiety disorder, unspecified: Secondary | ICD-10-CM | POA: Diagnosis not present

## 2022-11-13 DIAGNOSIS — R1310 Dysphagia, unspecified: Secondary | ICD-10-CM | POA: Diagnosis not present

## 2022-11-13 DIAGNOSIS — K625 Hemorrhage of anus and rectum: Secondary | ICD-10-CM

## 2022-11-13 HISTORY — PX: BALLOON DILATION: SHX5330

## 2022-11-13 HISTORY — PX: BIOPSY: SHX5522

## 2022-11-13 HISTORY — PX: COLONOSCOPY WITH PROPOFOL: SHX5780

## 2022-11-13 HISTORY — PX: ESOPHAGOGASTRODUODENOSCOPY (EGD) WITH PROPOFOL: SHX5813

## 2022-11-13 HISTORY — PX: POLYPECTOMY: SHX5525

## 2022-11-13 LAB — GLUCOSE, CAPILLARY: Glucose-Capillary: 91 mg/dL (ref 70–99)

## 2022-11-13 SURGERY — COLONOSCOPY WITH PROPOFOL
Anesthesia: General

## 2022-11-13 MED ORDER — LIDOCAINE HCL (CARDIAC) PF 100 MG/5ML IV SOSY
PREFILLED_SYRINGE | INTRAVENOUS | Status: DC | PRN
  Administered 2022-11-13: 50 mg via INTRAVENOUS

## 2022-11-13 MED ORDER — STERILE WATER FOR IRRIGATION IR SOLN
Status: DC | PRN
  Administered 2022-11-13: 180 mL

## 2022-11-13 MED ORDER — PROPOFOL 500 MG/50ML IV EMUL
INTRAVENOUS | Status: DC | PRN
  Administered 2022-11-13: 150 ug/kg/min via INTRAVENOUS

## 2022-11-13 MED ORDER — PROPOFOL 10 MG/ML IV BOLUS
INTRAVENOUS | Status: DC | PRN
  Administered 2022-11-13: 80 mg via INTRAVENOUS

## 2022-11-13 MED ORDER — LACTATED RINGERS IV SOLN: INTRAVENOUS | Status: DC

## 2022-11-13 NOTE — Discharge Instructions (Addendum)
EGD Discharge instructions Please read the instructions outlined below and refer to this sheet in the next few weeks. These discharge instructions provide you with general information on caring for yourself after you leave the hospital. Your doctor may also give you specific instructions. While your treatment has been planned according to the most current medical practices available, unavoidable complications occasionally occur. If you have any problems or questions after discharge, please call your doctor. ACTIVITY You may resume your regular activity but move at a slower pace for the next 24 hours.  Take frequent rest periods for the next 24 hours.  Walking will help expel (get rid of) the air and reduce the bloated feeling in your abdomen.  No driving for 24 hours (because of the anesthesia (medicine) used during the test).  You may shower.  Do not sign any important legal documents or operate any machinery for 24 hours (because of the anesthesia used during the test).  NUTRITION Drink plenty of fluids.  You may resume your normal diet.  Begin with a light meal and progress to your normal diet.  Avoid alcoholic beverages for 24 hours or as instructed by your caregiver.  MEDICATIONS You may resume your normal medications unless your caregiver tells you otherwise.  WHAT YOU CAN EXPECT TODAY You may experience abdominal discomfort such as a feeling of fullness or "gas" pains.  FOLLOW-UP Your doctor will discuss the results of your test with you.  SEEK IMMEDIATE MEDICAL ATTENTION IF ANY OF THE FOLLOWING OCCUR: Excessive nausea (feeling sick to your stomach) and/or vomiting.  Severe abdominal pain and distention (swelling).  Trouble swallowing.  Temperature over 101 F (37.8 C).  Rectal bleeding or vomiting of blood.     Colonoscopy Discharge Instructions  Read the instructions outlined below and refer to this sheet in the next few weeks. These discharge instructions provide you with  general information on caring for yourself after you leave the hospital. Your doctor may also give you specific instructions. While your treatment has been planned according to the most current medical practices available, unavoidable complications occasionally occur.   ACTIVITY You may resume your regular activity, but move at a slower pace for the next 24 hours.  Take frequent rest periods for the next 24 hours.  Walking will help get rid of the air and reduce the bloated feeling in your belly (abdomen).  No driving for 24 hours (because of the medicine (anesthesia) used during the test).   Do not sign any important legal documents or operate any machinery for 24 hours (because of the anesthesia used during the test).  NUTRITION Drink plenty of fluids.  You may resume your normal diet as instructed by your doctor.  Begin with a light meal and progress to your normal diet. Heavy or fried foods are harder to digest and may make you feel sick to your stomach (nauseated).  Avoid alcoholic beverages for 24 hours or as instructed.  MEDICATIONS You may resume your normal medications unless your doctor tells you otherwise.  WHAT YOU CAN EXPECT TODAY Some feelings of bloating in the abdomen.  Passage of more gas than usual.  Spotting of blood in your stool or on the toilet paper.  IF YOU HAD POLYPS REMOVED DURING THE COLONOSCOPY: No aspirin products for 7 days or as instructed.  No alcohol for 7 days or as instructed.  Eat a soft diet for the next 24 hours.  FINDING OUT THE RESULTS OF YOUR TEST Not all test results are  available during your visit. If your test results are not back during the visit, make an appointment with your caregiver to find out the results. Do not assume everything is normal if you have not heard from your caregiver or the medical facility. It is important for you to follow up on all of your test results.  SEEK IMMEDIATE MEDICAL ATTENTION IF: You have more than a spotting of  blood in your stool.  Your belly is swollen (abdominal distention).  You are nauseated or vomiting.  You have a temperature over 101.  You have abdominal pain or discomfort that is severe or gets worse throughout the day.    Your EGD revealed mild amount inflammation in your stomach.  I took biopsies of this to rule out infection with a bacteria called H. pylori.  Await pathology results, my office will contact you.  You have a large hiatal hernia.  You also have a Schatzki's ring which was previously seen.  I stretched this out again today.  I also further disrupted it with forceps.  Hopefully this helps with the feeling of food getting stuck.  Continue on pantoprazole twice daily  Your colonoscopy revealed 1 polyp(s) which I removed successfully. Await pathology results, my office will contact you.   Given your age, I do not think we need to perform further colonoscopy for polyp surveillance.  You also have diverticulosis and internal hemorrhoids. I would recommend increasing fiber in your diet or adding OTC Benefiber/Metamucil. Be sure to drink at least 4 to 6 glasses of water daily. Follow-up with GI in 2-3 months   I hope you have a great rest of your week!  Dana Romero. Marletta Lor, D.O. Gastroenterology and Hepatology [facility]

## 2022-11-13 NOTE — Op Note (Signed)
[facility] Patient Name: Dana Romero Procedure Date: 11/13/2022 8:40 AM MRN: [id number] Date of Birth: 1950/12/20 Attending MD: Hennie Duos. Marletta Lor , [place], [id number] CSN: [id number] Age: 72 Admit Type: Outpatient Procedure:                Upper GI endoscopy Indications:              Epigastric abdominal pain, Dysphagia, Nausea with                            vomiting Providers:                Hennie Duos. Marletta Lor, DO, Sheran Fava, Pandora Leiter, Technician Referring MD:              Medicines:                See the Anesthesia note for documentation of the                            administered medications Complications:            No immediate complications. Estimated Blood Loss:     Estimated blood loss was minimal. Procedure:                Pre-Anesthesia Assessment:                           - The anesthesia plan was to use monitored                            anesthesia care (MAC).                           After obtaining informed consent, the endoscope was                            passed under direct vision. Throughout the                            procedure, the patient's blood pressure, pulse, and                            oxygen saturations were monitored continuously. The                            [id number]-H190 ([id number]) scope was introduced through the                            mouth, and advanced to the second part of duodenum.                            The upper GI endoscopy was accomplished without                            difficulty. The patient tolerated the procedure  well. Scope In: 8:50:27 AM Scope Out: 8:58:09 AM Total Procedure Duration: 0 hours 7 minutes 42 seconds  Findings:      A large hiatal hernia was present.      A moderate Schatzki ring was found in the distal esophagus. A TTS       dilator was passed through the scope. Dilation with an 18-19-20 mm       balloon dilator was performed to  19 mm. The dilation site was examined       and showed mild mucosal disruption and moderate improvement in luminal       narrowing. Ring was then further disrupted with cold forceps      Diffuse moderate inflammation characterized by erythema and friability       was found in the entire examined stomach. Biopsies were taken with a       cold forceps for Helicobacter pylori testing.      The duodenal bulb, first portion of the duodenum and second portion of       the duodenum were normal. Impression:               - Large hiatal hernia.                           - Moderate Schatzki ring. Dilated.                           - Gastritis. Biopsied.                           - Normal duodenal bulb, first portion of the                            duodenum and second portion of the duodenum. Moderate Sedation:      Per Anesthesia Care Recommendation:           - Patient has a contact number available for                            emergencies. The signs and symptoms of potential                            delayed complications were discussed with the                            patient. Return to normal activities tomorrow.                            Written discharge instructions were provided to the                            patient.                           - Resume previous diet.                           - Continue present medications.                           -  Await pathology results.                           - Repeat upper endoscopy PRN for retreatment.                           - Return to GI clinic in 3 months.                           - Use a proton pump inhibitor PO BID. Procedure Code(s):        --- Professional ---                           [phone], Esophagogastroduodenoscopy, flexible,                            transoral; with transendoscopic balloon dilation of                            esophagus (less than 30 mm diameter)                           43239, 59,  Esophagogastroduodenoscopy, flexible,                            transoral; with biopsy, single or multiple Diagnosis Code(s):        --- Professional ---                           K44.9, Diaphragmatic hernia without obstruction or                            gangrene                           K22.2, Esophageal obstruction                           K29.70, Gastritis, unspecified, without bleeding                           R10.13, Epigastric pain                           R13.10, Dysphagia, unspecified                           R11.2, Nausea with vomiting, unspecified CPT copyright 2022 American Medical Association. All rights reserved. The codes documented in this report are preliminary and upon coder review may  be revised to meet current compliance requirements. Hennie Duos. Marletta Lor, DO Hennie Duos. Pammy Vesey, DO 11/13/2022 9:00:39 AM This report has been signed electronically. Number of Addenda: 0

## 2022-11-13 NOTE — Op Note (Signed)
[facility] Patient Name: Dana Romero Procedure Date: 11/13/2022 8:39 AM MRN: [id number] Date of Birth: Aug 04, 1950 Attending MD: Hennie Duos. Marletta Lor , [place], [id number] CSN: [id number] Age: 72 Admit Type: Outpatient Procedure:                Colonoscopy Indications:              Screening for colorectal malignant neoplasm Providers:                Hennie Duos. Marletta Lor, DO, Sheran Fava, Pandora Leiter, Technician Referring MD:              Medicines:                See the Anesthesia note for documentation of the                            administered medications Complications:            No immediate complications. Estimated Blood Loss:     Estimated blood loss was minimal. Procedure:                Pre-Anesthesia Assessment:                           - The anesthesia plan was to use monitored                            anesthesia care (MAC).                           After obtaining informed consent, the colonoscope                            was passed under direct vision. Throughout the                            procedure, the patient's blood pressure, pulse, and                            oxygen saturations were monitored continuously. The                            [id number]-HQ190L ([id number]) was introduced through the                            anus and advanced to the the cecum, identified by                            appendiceal orifice and ileocecal valve. The                            colonoscopy was performed without difficulty. The                            patient tolerated the procedure well. The quality  of the bowel preparation was evaluated using the                            BBPS [facility] Bowel Preparation Scale) with scores                            of: Right Colon = 2 (minor amount of residual                            staining, small fragments of stool and/or opaque                            liquid, but  mucosa seen well), Transverse Colon = 2                            (minor amount of residual staining, small fragments                            of stool and/or opaque liquid, but mucosa seen                            well) and Left Colon = 2 (minor amount of residual                            staining, small fragments of stool and/or opaque                            liquid, but mucosa seen well). The total BBPS score                            equals 6. The quality of the bowel preparation was                            good. Scope In: 9:02:43 AM Scope Out: 9:19:44 AM Scope Withdrawal Time: 0 hours 12 minutes 19 seconds  Total Procedure Duration: 0 hours 17 minutes 1 second  Findings:      Non-bleeding internal hemorrhoids were found during endoscopy.      A few large-mouthed and small-mouthed diverticula were found in the       sigmoid colon.      An 8 mm polyp was found in the transverse colon. The polyp was sessile.       The polyp was removed with a cold snare. Resection and retrieval were       complete.      The exam was otherwise without abnormality. Impression:               - Non-bleeding internal hemorrhoids.                           - Diverticulosis in the sigmoid colon.                           - One 8 mm polyp in the transverse colon, removed  with a cold snare. Resected and retrieved.                           - The examination was otherwise normal. Moderate Sedation:      Per Anesthesia Care Recommendation:           - Patient has a contact number available for                            emergencies. The signs and symptoms of potential                            delayed complications were discussed with the                            patient. Return to normal activities tomorrow.                            Written discharge instructions were provided to the                            patient.                           - Resume previous  diet.                           - Continue present medications.                           - Await pathology results.                           - No repeat colonoscopy due to age.                           - Return to GI clinic in 3 months. Procedure Code(s):        --- Professional ---                           [phone], Colonoscopy, flexible; with removal of                            tumor(s), polyp(s), or other lesion(s) by snare                            technique Diagnosis Code(s):        --- Professional ---                           Z12.11, Encounter for screening for malignant                            neoplasm of colon                           K64.8, Other hemorrhoids  D12.3, Benign neoplasm of transverse colon (hepatic                            flexure or splenic flexure)                           K57.30, Diverticulosis of large intestine without                            perforation or abscess without bleeding CPT copyright 2022 American Medical Association. All rights reserved. The codes documented in this report are preliminary and upon coder review may  be revised to meet current compliance requirements. Hennie Duos. Marletta Lor, DO Hennie Duos. Marletta Lor, DO 11/13/2022 9:21:43 AM This report has been signed electronically. Number of Addenda: 0

## 2022-11-13 NOTE — Transfer of Care (Signed)
Immediate Anesthesia Transfer of Care Note  Patient: Dana Romero  Procedure(s) Performed: COLONOSCOPY WITH PROPOFOL ESOPHAGOGASTRODUODENOSCOPY (EGD) WITH PROPOFOL BALLOON DILATION BIOPSY POLYPECTOMY  Patient Location: Short Stay  Anesthesia Type:General  Level of Consciousness: drowsy  Airway & Oxygen Therapy: Patient Spontanous Breathing  Post-op Assessment: Report given to RN and Post -op Vital signs reviewed and stable  Post vital signs: Reviewed and stable  Last Vitals:  Vitals Value Taken Time  BP 153/71 11/13/22 0924  Temp 36.5 C 11/13/22 0924  Pulse 61 11/13/22 0924  Resp 16 11/13/22 0924  SpO2 100 % 11/13/22 0924    Last Pain:  Vitals:   11/13/22 0924  TempSrc:   PainSc: Asleep      Patients Stated Pain Goal: 4 (11/13/22 0812)  Complications: No notable events documented.

## 2022-11-13 NOTE — H&P (Signed)
Primary Care Physician:  Mattie Marlin, DO Primary Gastroenterologist:  Dr. Marletta Lor  Pre-Procedure History & Physical: HPI:  Dana Romero is a 72 y.o. female is here for an upper endoscopy with possible dilation due to dysphagia, abnormal esophagram, and  a colonoscopy to be performed for colon cancer screening purposes.  Past Medical History:  Diagnosis Date   Angina at rest    Anxiety    Asthma    Caregiver stress 07/04/2011   Chronic pain syndrome    Degenerative disc disease, cervical    Depression    Essential hypertension 1998   GERD (gastroesophageal reflux disease)    Gout    Hiatal hernia    History of pancreatitis 06/08/2005   History of stroke    September 2020   N&V (nausea and vomiting) 04/13/2021   Pelvic mass    Renal insufficiency    Seasonal allergies    Sinusitis, chronic    Stroke (HCC)    Transfusion history    Type 2 diabetes mellitus (HCC) 1998    Past Surgical History:  Procedure Laterality Date   APPENDECTOMY     ARM DEBRIDEMENT Left    debridement of axilla "spider bite"   BALLOON DILATION N/A 06/30/2021   Procedure: BALLOON DILATION;  Surgeon: Lanelle Bal, DO;  Location: [facility];  Service: Endoscopy;  Laterality: N/A;   BALLOON DILATION N/A 01/18/2022   Procedure: BALLOON DILATION;  Surgeon: Lanelle Bal, DO;  Location: [facility];  Service: Endoscopy;  Laterality: N/A;   BIOPSY  06/30/2021   Procedure: BIOPSY;  Surgeon: Lanelle Bal, DO;  [facility]: [facility];  Service: Endoscopy;;   BIOPSY  01/18/2022   Procedure: BIOPSY;  Surgeon: Lanelle Bal, DO;  Location: [facility];  Service: Endoscopy;;   BREAST SURGERY     breast cyst x2 -last '12   CATARACT EXTRACTION W/PHACO Left 11/25/2021   Procedure: CATARACT EXTRACTION PHACO AND INTRAOCULAR LENS PLACEMENT (IOC);  Surgeon: Fabio Pierce, MD;  Location: [facility];  Service: Ophthalmology;  Laterality: Left;  CDE 4.11   CATARACT EXTRACTION W/PHACO Right 01/06/2022    Procedure: CATARACT EXTRACTION PHACO AND INTRAOCULAR LENS PLACEMENT (IOC);  Surgeon: Fabio Pierce, MD;  Location: [facility];  Service: Ophthalmology;  Laterality: Right;  CDE 4.97   CHOLECYSTECTOMY N/A 08/11/2015   Procedure: LAPAROSCOPIC CHOLECYSTECTOMY;  Surgeon: De Blanch Kinsinger, MD;  Location: [facility];  Service: General;  Laterality: N/A;   ESOPHAGOGASTRODUODENOSCOPY (EGD) WITH PROPOFOL N/A 06/30/2021   Surgeon: Lanelle Bal, DO;   Moderate size hiatal hernia, moderate Schatzki's ring dilated, gastritis biopsied, normal examined duodenum.  Pathology with gastric antral and oxyntic mucosa with nonspecific reactive gastropathy, negative for H. pylori.   ESOPHAGOGASTRODUODENOSCOPY (EGD) WITH PROPOFOL N/A 01/18/2022   Procedure: ESOPHAGOGASTRODUODENOSCOPY (EGD) WITH PROPOFOL;  Surgeon: Lanelle Bal, DO;  Location: [facility];  Service: Endoscopy;  Laterality: N/A;  1:15 pm   TONSILLECTOMY     TONSILLECTOMY AND ADENOIDECTOMY     TUBAL LIGATION     post partum    Prior to Admission medications   Medication Sig Start Date End Date Taking? Authorizing Provider  ADVAIR HFA 115-21 MCG/ACT inhaler INHALE 2 PUFFS INTO LUNGS TWICE DAILY Patient taking differently: Inhale 2 puffs into the lungs 2 (two) times daily as needed (shortness of breath or wheezing). 04/05/17  Yes Ethelda Chick, MD  albuterol (PROVENTIL) (2.5 MG/3ML) 0.083% nebulizer solution USE ONE VIAL IN NEBULIZER EVERY 6 HOURS AS NEEDED  FOR WHEEZING OR SHORTNESS OF BREATH 09/25/15  Yes Ethelda Chick, MD  albuterol (VENTOLIN HFA) 108 (90 Base) MCG/ACT inhaler INHALE ONE TO TWO PUFFS BY MOUTH EVERY 6 HOURS AS NEEDED FOR WHEEZING OR SHORTNESS OF BREATH (OR PERSISTENT COUGHING) 01/18/16  Yes Ethelda Chick, MD  Alcohol Swabs PADS Use to check blood sugar daily. E11.9 04/07/15  Yes Ethelda Chick, MD  ALPRAZolam Prudy Feeler) 1 MG tablet Take 0.5-1 mg by mouth See admin instructions. Take 1 tablet (1 mg) by mouth (scheduled) in the  morning at bedtime & may take 0.5 mg additional if needed for anxiety. 02/28/21  Yes [provider]  amLODipine (NORVASC) 10 MG tablet Take 10 mg by mouth daily. 09/06/22 09/06/23 Yes [provider]  ARIPiprazole (ABILIFY) 5 MG tablet Take 5 mg by mouth at bedtime.   Yes [provider]  ascorbic acid (VITAMIN C) 500 MG tablet Take 500 mg by mouth daily.   Yes [provider]  carvedilol (COREG CR) 40 MG 24 hr capsule Take 1 capsule (40 mg total) by mouth daily. 07/04/21  Yes Hilty, Lisette Abu, MD  cholecalciferol (VITAMIN D3) 25 MCG (1000 UNIT) tablet Take 1,000 Units by mouth daily.   Yes [provider]  EPINEPHrine (EPIPEN 2-PAK) 0.3 mg/0.3 mL IJ SOAJ injection Inject 0.3 mLs (0.3 mg total) into the muscle once. 01/07/15  Yes Ethelda Chick, MD  esomeprazole (NEXIUM) 40 MG capsule Take 40 mg by mouth daily before breakfast. 09/20/22  Yes [provider]  estradiol (VIVELLE-DOT) 0.05 MG/24HR patch Place 1 patch onto the skin 2 (two) times a week. Typically Tuesdays & Thursdays 06/03/21  Yes [provider]  FLUoxetine (PROZAC) 40 MG capsule Take 80 mg by mouth every evening.   Yes [provider]  fluticasone (FLONASE) 50 MCG/ACT nasal spray Place 2 sprays into both nostrils daily.   Yes [provider]  Garlic (GARLIQUE) 400 MG TBEC Take 400 mg by mouth daily.   Yes [provider]  levocetirizine (XYZAL) 5 MG tablet Take 5 mg by mouth every evening.   Yes [provider]  magnesium oxide (MAG-OX) 400 MG tablet Take 400 mg by mouth daily.   Yes [provider]  meclizine (ANTIVERT) 25 MG tablet Take 25 mg by mouth 3 (three) times daily as needed for dizziness or nausea. 06/28/20  Yes [provider]  medroxyPROGESTERone (PROVERA) 2.5 MG tablet TAKE ONE TABLET BY MOUTH ONCE DAILY 10/14/15  Yes Ethelda Chick, MD  Menthol, Topical Analgesic, (BIOFREEZE) 4 % GEL Apply 1 application  topically  as needed (pain).   Yes [provider]  olmesartan (BENICAR) 20 MG tablet Take 20 mg by mouth daily. 10/31/22  Yes [provider]  ondansetron (ZOFRAN) 8 MG tablet Take 1 tablet (8 mg total) by mouth every 8 (eight) hours as needed for nausea. 01/22/20  Yes Lazaro Arms, MD  ondansetron (ZOFRAN-ODT) 4 MG disintegrating tablet Take 4 mg by mouth every 8 (eight) hours as needed for vomiting or nausea.   Yes [provider]  oxyCODONE-acetaminophen (PERCOCET) 10-325 MG tablet Take 1 tablet by mouth 4 (four) times daily as needed for pain. 03/29/22  Yes [provider]  pantoprazole (PROTONIX) 40 MG tablet TAKE 1 TABLET (40 MG TOTAL) BY MOUTH TWICE A DAY BEFORE MEALS 09/04/22  Yes Clearance Coots, Kristen S, PA-C  Propylene Glycol (SYSTANE BALANCE) 0.6 % SOLN Place 1 drop into both eyes in the morning, at noon, and  at bedtime.   Yes [provider]  Semaglutide (OZEMPIC, 0.25 OR 0.5 MG/DOSE, [place]) Inject 0.5 mg into the skin once a week.   Yes [provider]  blood glucose meter kit and supplies KIT Dispense based on patient and insurance preference. Use once a day.  DX: E11.9. 12/31/14   Ethelda Chick, MD  blood glucose meter kit and supplies Dispense based on patient and insurance preference. Use up to four times daily as directed. (FOR ICD-10 E11.9). 03/31/15   Ethelda Chick, MD  Blood Glucose Monitoring Suppl (FREESTYLE LITE) DEVI  01/12/15   [provider]  glucose blood test strip Test blood sugar daily.  DX: E11.9 04/06/15   Ethelda Chick, MD  glucose monitoring kit (FREESTYLE) monitoring kit Test blood sugar daily. DX: E11.9 01/12/15   Ethelda Chick, MD  Lancet Devices (LANCING DEVICE) MISC Use to test blood sugar daily. E11.9 04/07/15   Ethelda Chick, MD  Lancets MISC Test blood sugar daily. DX: E11.9 04/06/15   Ethelda Chick, MD    Allergies as of 10/13/2022 - Review Complete 10/13/2022  Allergen Reaction Noted   Lyrica  [pregabalin]  01/22/2020   Amlodipine besylate Other (See Comments)    Atorvastatin Other (See Comments)    Losartan potassium-hctz Nausea Only 02/28/2011   Morphine and codeine Other (See Comments) 08/11/2015   Niacin Other (See Comments) 10/12/2006   Rosuvastatin Other (See Comments)    Statins  05/11/2021   Toprol xl [metoprolol succinate] Other (See Comments) 02/28/2011   Vytorin [ezetimibe-simvastatin] Other (See Comments)    Latex Rash 06/06/2011   Valium [diazepam] Anxiety 04/12/2015    Family History  Problem Relation Age of Onset   Hypertension Mother    Depression Mother    Stomach cancer Mother 59   Hypertension Brother    Pancreatic cancer Brother    Diabetes Maternal Grandmother    Hypertension Maternal Grandmother    Breast cancer Maternal Grandmother    Heart attack Maternal Grandmother    Diabetes Maternal Aunt    Breast cancer Maternal Aunt    Diabetes Maternal Uncle    Hypertension Maternal Uncle    Hypertension Maternal Uncle    Stroke Other    Colon cancer Neg Hx     Social History   Socioeconomic History   Marital status: Single    Spouse name: Not on file   Number of children: Not on file   Years of education: Not on file   Highest education level: Not on file  Occupational History   Occupation: Retired  Tobacco Use   Smoking status: Never   Smokeless tobacco: Never  Vaping Use   Vaping Use: Never used  Substance and Sexual Activity   Alcohol use: No   Drug use: No   Sexual activity: Yes    Birth control/protection: Post-menopausal  Other Topics Concern   Not on file  Social History Narrative   Not on file   Social Determinants of Health   Financial Resource Strain: Not on file  Food Insecurity: Not on file  Transportation Needs: Not on file  Physical Activity: Not on file  Stress: Not on file  Social Connections: Not on file  Intimate Partner Violence: Not on file    Review of Systems: See HPI, otherwise negative  ROS  Physical Exam: Vital signs in last 24 hours: Temp:  [97.6 F (36.4 C)] 97.6 F (36.4 C) (07/08 0812) Pulse Rate:  [63] 63 (07/08 0812) Resp:  [  19] 19 (07/08 0812) BP: (168)/(82) 168/82 (07/08 0812) SpO2:  [100 %] 100 % (07/08 0812)   General:   Alert,  Well-developed, well-nourished, pleasant and cooperative in NAD Head:  Normocephalic and atraumatic. Eyes:  Sclera clear, no icterus.   Conjunctiva pink. Ears:  Normal auditory acuity. Nose:  No deformity, discharge,  or lesions. Msk:  Symmetrical without gross deformities. Normal posture. Extremities:  Without clubbing or edema. Neurologic:  Alert and  oriented x4;  grossly normal neurologically. Skin:  Intact without significant lesions or rashes. Psych:  Alert and cooperative. Normal mood and affect.  Impression/Plan: Dana Romero is here for an upper endoscopy with possible dilation due to dysphagia, abnormal esophagram, and  a colonoscopy to be performed for colon cancer screening purposes.  The risks of the procedure including infection, bleed, or perforation as well as benefits, limitations, alternatives and imponderables have been reviewed with the patient. Questions have been answered. All parties agreeable.

## 2022-11-13 NOTE — Anesthesia Postprocedure Evaluation (Signed)
Anesthesia Post Note  Patient: Dana Romero  Procedure(s) Performed: COLONOSCOPY WITH PROPOFOL ESOPHAGOGASTRODUODENOSCOPY (EGD) WITH PROPOFOL BALLOON DILATION BIOPSY POLYPECTOMY  Patient location during evaluation: Phase II Anesthesia Type: General Level of consciousness: awake and alert and oriented Pain management: pain level controlled Vital Signs Assessment: post-procedure vital signs reviewed and stable Respiratory status: spontaneous breathing, nonlabored ventilation and respiratory function stable Cardiovascular status: blood pressure returned to baseline and stable Postop Assessment: no apparent nausea or vomiting Anesthetic complications: no  No notable events documented.   Last Vitals:  Vitals:   11/13/22 0812 11/13/22 0924  BP: (!) 168/82 (!) 153/71  Pulse: 63 61  Resp: 19 16  Temp: 36.4 C 36.5 C  SpO2: 100% 100%    Last Pain:  Vitals:   11/13/22 0940  TempSrc:   PainSc: 0-No pain                 Amaris Garrette C Sanjana Folz

## 2022-11-13 NOTE — Anesthesia Preprocedure Evaluation (Signed)
Anesthesia Evaluation  Patient identified by MRN, date of birth, ID band Patient awake    Reviewed: Allergy & Precautions, H&P , NPO status , Patient's Chart, lab work & pertinent test results  Airway Mallampati: II  TM Distance: >3 FB Neck ROM: Full   Comment: Degenerative disc disease, cervical Dental  (+) Edentulous Upper, Edentulous Lower   Pulmonary asthma    Pulmonary exam normal breath sounds clear to auscultation       Cardiovascular Exercise Tolerance: Good hypertension, Pt. on medications + angina  Normal cardiovascular exam Rhythm:Regular Rate:Normal     Neuro/Psych  PSYCHIATRIC DISORDERS Anxiety Depression     Neuromuscular disease CVA, Residual Symptoms    GI/Hepatic Neg liver ROS, hiatal hernia,GERD  Medicated and Controlled,,  Endo/Other  diabetes, Well Controlled, Type 2, Oral Hypoglycemic Agents    Renal/GU Renal InsufficiencyRenal disease  negative genitourinary   Musculoskeletal  (+) Arthritis , Osteoarthritis,    Abdominal   Peds negative pediatric ROS (+)  Hematology negative hematology ROS (+)   Anesthesia Other Findings   Reproductive/Obstetrics negative OB ROS                             Anesthesia Physical Anesthesia Plan  ASA: 3  Anesthesia Plan: General   Post-op Pain Management: Minimal or no pain anticipated   Induction: Intravenous  PONV Risk Score and Plan: 1 and Propofol infusion  Airway Management Planned: Nasal Cannula and Natural Airway  Additional Equipment:   Intra-op Plan:   Post-operative Plan:   Informed Consent: I have reviewed the patients History and Physical, chart, labs and discussed the procedure including the risks, benefits and alternatives for the proposed anesthesia with the patient or authorized representative who has indicated his/her understanding and acceptance.       Plan Discussed with: CRNA and  Surgeon  Anesthesia Plan Comments:        Anesthesia Quick Evaluation

## 2022-11-14 LAB — SURGICAL PATHOLOGY

## 2022-11-17 ENCOUNTER — Encounter (HOSPITAL_COMMUNITY): Payer: Self-pay | Admitting: Internal Medicine

## 2022-12-27 ENCOUNTER — Encounter: Payer: Self-pay | Admitting: Gastroenterology

## 2023-01-17 ENCOUNTER — Other Ambulatory Visit: Payer: Self-pay

## 2023-01-17 ENCOUNTER — Emergency Department (HOSPITAL_COMMUNITY): Payer: 59

## 2023-01-17 ENCOUNTER — Encounter (HOSPITAL_COMMUNITY): Payer: Self-pay | Admitting: Emergency Medicine

## 2023-01-17 ENCOUNTER — Emergency Department (HOSPITAL_COMMUNITY)
Admission: EM | Admit: 2023-01-17 | Discharge: 2023-01-17 | Disposition: A | Discharge status: Home / Self Care | Payer: 59

## 2023-01-17 DIAGNOSIS — E1140 Type 2 diabetes mellitus with diabetic neuropathy, unspecified: Secondary | ICD-10-CM | POA: Insufficient documentation

## 2023-01-17 DIAGNOSIS — Z91040 Latex allergy status: Secondary | ICD-10-CM | POA: Diagnosis not present

## 2023-01-17 DIAGNOSIS — J45909 Unspecified asthma, uncomplicated: Secondary | ICD-10-CM | POA: Diagnosis not present

## 2023-01-17 DIAGNOSIS — Z79899 Other long term (current) drug therapy: Secondary | ICD-10-CM | POA: Insufficient documentation

## 2023-01-17 DIAGNOSIS — W1839XA Other fall on same level, initial encounter: Secondary | ICD-10-CM | POA: Insufficient documentation

## 2023-01-17 DIAGNOSIS — R531 Weakness: Secondary | ICD-10-CM | POA: Diagnosis not present

## 2023-01-17 DIAGNOSIS — S12001A Unspecified nondisplaced fracture of first cervical vertebra, initial encounter for closed fracture: Secondary | ICD-10-CM | POA: Insufficient documentation

## 2023-01-17 DIAGNOSIS — R519 Headache, unspecified: Secondary | ICD-10-CM | POA: Diagnosis present

## 2023-01-17 DIAGNOSIS — U071 COVID-19: Secondary | ICD-10-CM | POA: Insufficient documentation

## 2023-01-17 LAB — URINALYSIS, ROUTINE W REFLEX MICROSCOPIC
Bacteria, UA: NONE SEEN
Bilirubin Urine: NEGATIVE
Glucose, UA: NEGATIVE mg/dL
Hgb urine dipstick: NEGATIVE
Ketones, ur: 5 mg/dL — AB
Leukocytes,Ua: NEGATIVE
Nitrite: NEGATIVE
Protein, ur: 100 mg/dL — AB
Specific Gravity, Urine: 1.01 (ref 1.005–1.030)
pH: 8 (ref 5.0–8.0)

## 2023-01-17 LAB — CBC
HCT: 41.9 % (ref 36.0–46.0)
Hemoglobin: 13.5 g/dL (ref 12.0–15.0)
MCH: 27.3 pg (ref 26.0–34.0)
MCHC: 32.2 g/dL (ref 30.0–36.0)
MCV: 84.6 fL (ref 80.0–100.0)
Platelets: 222 10*3/uL (ref 150–400)
RBC: 4.95 MIL/uL (ref 3.87–5.11)
RDW: 14.3 % (ref 11.5–15.5)
WBC: 6.6 10*3/uL (ref 4.0–10.5)
nRBC: 0 % (ref 0.0–0.2)

## 2023-01-17 LAB — CBG MONITORING, ED: Glucose-Capillary: 170 mg/dL — ABNORMAL HIGH (ref 70–99)

## 2023-01-17 LAB — SARS CORONAVIRUS 2 BY RT PCR: SARS Coronavirus 2 by RT PCR: POSITIVE — AB

## 2023-01-17 LAB — BASIC METABOLIC PANEL
Anion gap: 11 (ref 5–15)
BUN: 15 mg/dL (ref 8–23)
CO2: 24 mmol/L (ref 22–32)
Calcium: 9.4 mg/dL (ref 8.9–10.3)
Chloride: 99 mmol/L (ref 98–111)
Creatinine, Ser: 1.15 mg/dL — ABNORMAL HIGH (ref 0.44–1.00)
GFR, Estimated: 50 mL/min — ABNORMAL LOW (ref >60)
Glucose, Bld: 151 mg/dL — ABNORMAL HIGH (ref 70–99)
Potassium: 4.1 mmol/L (ref 3.5–5.1)
Sodium: 134 mmol/L — ABNORMAL LOW (ref 135–145)

## 2023-01-17 LAB — HEPATIC FUNCTION PANEL
ALT: 32 U/L (ref 0–44)
AST: 52 U/L — ABNORMAL HIGH (ref 15–41)
Albumin: 4.1 g/dL (ref 3.5–5.0)
Alkaline Phosphatase: 48 U/L (ref 38–126)
Bilirubin, Direct: 0.1 mg/dL (ref 0.0–0.2)
Indirect Bilirubin: 0.3 mg/dL (ref 0.3–0.9)
Total Bilirubin: 0.4 mg/dL (ref 0.3–1.2)
Total Protein: 7.9 g/dL (ref 6.5–8.1)

## 2023-01-17 LAB — TROPONIN I (HIGH SENSITIVITY)
Troponin I (High Sensitivity): 5 ng/L (ref <18)
Troponin I (High Sensitivity): 7 ng/L (ref <18)

## 2023-01-17 MED ORDER — ACETAMINOPHEN 500 MG PO TABS
500.0000 mg | ORAL_TABLET | Freq: Once | ORAL | Status: AC
  Administered 2023-01-17: 500 mg via ORAL
  Filled 2023-01-17: qty 1

## 2023-01-17 NOTE — ED Triage Notes (Addendum)
Pt states headache on right side woke her up today at 0245am. Pt got up and fell. Pt states she felt confused and weak all over. NIH done on arrival. Pt is a/o. Gen weakness noted. Color wnl. Pt has hx of dizziness and has been dizzy this am.  Bruising noted to legs/arms and right shoulder which pt states she falls often. Baseline walks with walker most of time. Pupils perrla. LKW at 11 pm 01/16/23

## 2023-01-17 NOTE — ED Notes (Signed)
Pt states feels lighter touch on left side of face than on right. Arm/legs were equal.   Pt had slight drift to right leg.

## 2023-01-17 NOTE — ED Provider Notes (Signed)
[place] [facility] AT [facility] Provider Note   CSN: [id number] Arrival date & time: 01/17/23  4098     History  Chief Complaint  Patient presents with   Weakness   Headache    Dana Romero is a 72 y.o. female history of CVA September 2020 not currently anticoagulated, type 2 diabetes, diabetic peripheral neuropathy, GERD, asthma and otitis pretension presented with headache, fall.  Patient states that she went to bed at 11 PM last night and had a headache that was all around her head that woke her up around 2:45 AM.  Patient got up because his headache and then fell.  Patient is unsure if she hit her head or able to further describe this fall or LOC but notes that when she came to she was weak all over.  Patient notes bruising to the right shoulder and right lower leg but otherwise does not endorse any pain there.  Patient has been feeling dizzy since then as well in which she feels unstable.  Patient thinks her fall may be related to previous stroke and denies any recent fevers, sick contacts, chest pain, shortness of breath, dysuria, abdominal pain, nausea/vomiting.   Home Medications Prior to Admission medications   Medication Sig Start Date End Date Taking? Authorizing Provider  ADVAIR HFA 115-21 MCG/ACT inhaler INHALE 2 PUFFS INTO LUNGS TWICE DAILY Patient taking differently: Inhale 2 puffs into the lungs 2 (two) times daily as needed (shortness of breath or wheezing). 04/05/17   Ethelda Chick, MD  albuterol (PROVENTIL) (2.5 MG/3ML) 0.083% nebulizer solution USE ONE VIAL IN NEBULIZER EVERY 6 HOURS AS NEEDED FOR WHEEZING OR SHORTNESS OF BREATH 09/25/15   Ethelda Chick, MD  albuterol (VENTOLIN HFA) 108 (90 Base) MCG/ACT inhaler INHALE ONE TO TWO PUFFS BY MOUTH EVERY 6 HOURS AS NEEDED FOR WHEEZING OR SHORTNESS OF BREATH (OR PERSISTENT COUGHING) 01/18/16   Ethelda Chick, MD  Alcohol Swabs PADS Use to check blood sugar daily. E11.9 04/07/15   Ethelda Chick,  MD  ALPRAZolam Prudy Feeler) 1 MG tablet Take 0.5-1 mg by mouth See admin instructions. Take 1 tablet (1 mg) by mouth (scheduled) in the morning at bedtime & may take 0.5 mg additional if needed for anxiety. 02/28/21   [provider]  amLODipine (NORVASC) 10 MG tablet Take 10 mg by mouth daily. 09/06/22 09/06/23  [provider]  ARIPiprazole (ABILIFY) 5 MG tablet Take 5 mg by mouth at bedtime.    [provider]  ascorbic acid (VITAMIN C) 500 MG tablet Take 500 mg by mouth daily.    [provider]  blood glucose meter kit and supplies KIT Dispense based on patient and insurance preference. Use once a day.  DX: E11.9. 12/31/14   Ethelda Chick, MD  blood glucose meter kit and supplies Dispense based on patient and insurance preference. Use up to four times daily as directed. (FOR ICD-10 E11.9). 03/31/15   Ethelda Chick, MD  Blood Glucose Monitoring Suppl (FREESTYLE LITE) DEVI  01/12/15   [provider]  carvedilol (COREG CR) 40 MG 24 hr capsule Take 1 capsule (40 mg total) by mouth daily. 07/04/21   Hilty, Lisette Abu, MD  cholecalciferol (VITAMIN D3) 25 MCG (1000 UNIT) tablet Take 1,000 Units by mouth daily.    [provider]  EPINEPHrine (EPIPEN 2-PAK) 0.3 mg/0.3 mL IJ SOAJ injection Inject 0.3 mLs (0.3 mg total) into the muscle once. 01/07/15   Ethelda Chick, MD  estradiol (VIVELLE-DOT) 0.05 MG/24HR patch Place 1 patch onto the skin 2 (two) times a week. Typically Tuesdays & Thursdays 06/03/21   [provider]  FLUoxetine (PROZAC) 40 MG capsule Take 80 mg by mouth every evening.    [provider]  fluticasone (FLONASE) 50 MCG/ACT nasal spray Place 2 sprays into both nostrils daily.    [provider]  Garlic (GARLIQUE) 400 MG TBEC Take 400 mg by mouth daily.    [provider]  glucose blood test strip Test blood sugar daily.  DX: E11.9 04/06/15   Ethelda Chick, MD  glucose monitoring kit (FREESTYLE) monitoring kit  Test blood sugar daily. DX: E11.9 01/12/15   Ethelda Chick, MD  Lancet Devices (LANCING DEVICE) MISC Use to test blood sugar daily. E11.9 04/07/15   Ethelda Chick, MD  Lancets MISC Test blood sugar daily. DX: E11.9 04/06/15   Ethelda Chick, MD  levocetirizine (XYZAL) 5 MG tablet Take 5 mg by mouth every evening.    [provider]  magnesium oxide (MAG-OX) 400 MG tablet Take 400 mg by mouth daily.    [provider]  meclizine (ANTIVERT) 25 MG tablet Take 25 mg by mouth 3 (three) times daily as needed for dizziness or nausea. 06/28/20   [provider]  medroxyPROGESTERone (PROVERA) 2.5 MG tablet TAKE ONE TABLET BY MOUTH ONCE DAILY 10/14/15   Ethelda Chick, MD  Menthol, Topical Analgesic, (BIOFREEZE) 4 % GEL Apply 1 application  topically as needed (pain).    [provider]  olmesartan (BENICAR) 20 MG tablet Take 20 mg by mouth daily. 10/31/22   [provider]  ondansetron (ZOFRAN) 8 MG tablet Take 1 tablet (8 mg total) by mouth every 8 (eight) hours as needed for nausea. 01/22/20   Lazaro Arms, MD  ondansetron (ZOFRAN-ODT) 4 MG disintegrating tablet Take 4 mg by mouth every 8 (eight) hours as needed for vomiting or nausea.    [provider]  oxyCODONE-acetaminophen (PERCOCET) 10-325 MG tablet Take 1 tablet by mouth 4 (four) times daily as needed for pain. 03/29/22   [provider]  pantoprazole (PROTONIX) 40 MG tablet TAKE 1 TABLET (40 MG TOTAL) BY MOUTH TWICE A DAY BEFORE MEALS 09/04/22   Letta Median, PA-C  Propylene Glycol (SYSTANE BALANCE) 0.6 % SOLN Place 1 drop into both eyes in the morning, at noon, and at bedtime.    [provider]  Semaglutide (OZEMPIC, 0.25 OR 0.5 MG/DOSE, [place]) Inject 0.5 mg into the skin once a week.    [provider]      Allergies    Lyrica [pregabalin], Amlodipine besylate, Atorvastatin, Losartan potassium-hctz, Morphine and codeine, Niacin, Rosuvastatin, Statins, Toprol xl  [metoprolol succinate], Vytorin [ezetimibe-simvastatin], Latex, and Valium [diazepam]    Review of Systems   Review of Systems  Neurological:  Positive for weakness and headaches.    Physical Exam Updated Vital Signs BP (!) 152/71   Pulse 96   Temp (!) 102.1 F (38.9 C) (Rectal)   Resp 20   LMP 03/15/2014   SpO2 95%  Physical Exam Constitutional:      General: She is not in acute distress.    Comments: Whole body tremors which appear to be baseline  HENT:     Head: Normocephalic and atraumatic.     Nose: Nose normal.     Mouth/Throat:     Mouth: Mucous membranes are moist.     Pharynx: No posterior oropharyngeal erythema.  Eyes:     Extraocular Movements: Extraocular movements intact.     Conjunctiva/sclera: Conjunctivae normal.     Pupils: Pupils are equal, round, and reactive to light.  Cardiovascular:     Rate and Rhythm: Normal rate and regular rhythm.     Pulses: Normal pulses.  Pulmonary:     Effort: Pulmonary effort is normal. No respiratory distress.     Breath sounds: Normal breath sounds.  Abdominal:     Palpations: Abdomen is soft.     Tenderness: There is no abdominal tenderness. There is no guarding or rebound.  Musculoskeletal:        General: Normal range of motion.     Cervical back: Normal range of motion and neck supple. No tenderness.  Skin:    General: Skin is warm and dry.     Capillary Refill: Capillary refill takes less than 2 seconds.     Comments: Ecchymosis noted to right shoulder and right tib-fib  Neurological:     Mental Status: She is alert and oriented to person, place, and time.     Sensory: Sensation is intact.     Motor: Motor function is intact.     Coordination: Coordination is intact.     Comments: Vision grossly intact Cranial nerves III through XII intact  Psychiatric:        Mood and Affect: Mood normal.     ED Results / Procedures / Treatments   Labs (all labs ordered are listed, but only abnormal results are  displayed) Labs Reviewed  SARS CORONAVIRUS 2 BY RT PCR - Abnormal; Notable for the following components:      Result Value   SARS Coronavirus 2 by RT PCR POSITIVE (*)    All other components within normal limits  BASIC METABOLIC PANEL - Abnormal; Notable for the following components:   Sodium 134 (*)    Glucose, Bld 151 (*)    Creatinine, Ser 1.15 (*)    GFR, Estimated 50 (*)    All other components within normal limits  URINALYSIS, ROUTINE W REFLEX MICROSCOPIC - Abnormal; Notable for the following components:   Color, Urine STRAW (*)    Ketones, ur 5 (*)    Protein, ur 100 (*)    All other components within normal limits  HEPATIC FUNCTION PANEL - Abnormal; Notable for the following components:   AST 52 (*)    All other components within normal limits  CBG MONITORING, ED - Abnormal; Notable for the following components:   Glucose-Capillary 170 (*)    All other components within normal limits  CBC  TROPONIN I (HIGH SENSITIVITY)  TROPONIN I (HIGH SENSITIVITY)    EKG EKG Interpretation Date/Time:  Wednesday January 17 2023 09:07:15 EDT Ventricular Rate:  91 PR Interval:  158 QRS Duration:  90 QT Interval:  393 QTC Calculation: 484 R Axis:   -35  Text Interpretation: Sinus rhythm Probable left atrial enlargement Left axis deviation Anteroseptal infarct, old Confirmed by Beckey Downing [phone]) on 01/17/2023 9:11:09 AM  Radiology CT Head Wo Contrast  Result Date: 01/17/2023 CLINICAL DATA:  Headache, recent stroke, fall; Fall EXAM: CT HEAD WITHOUT CONTRAST CT CERVICAL SPINE WITHOUT CONTRAST TECHNIQUE: Multidetector CT imaging of the head and cervical spine was performed following the standard protocol without intravenous contrast. Multiplanar CT image reconstructions of the cervical spine were also generated. RADIATION DOSE REDUCTION: This exam was performed according to the departmental dose-optimization program which includes automated exposure control, adjustment of the mA and/or  kV according  to patient size and/or use of iterative reconstruction technique. COMPARISON:  CT cervical spine 08/05/2007 FINDINGS: CT HEAD FINDINGS Brain: No evidence of acute infarction, hemorrhage, hydrocephalus, extra-axial collection or mass lesion/mass effect. Sequela of moderate chronic microvascular ischemic change. Likely chronic left cerebellar infarct. Vascular: No hyperdense vessel or unexpected calcification. Skull: Normal. Negative for fracture or focal lesion. Sinuses/Orbits: No middle ear or mastoid effusion. Paranasal sinuses are clear. Bilateral lens replacement. Orbits are otherwise unremarkable. Other: None. CT CERVICAL SPINE FINDINGS Alignment: Trace retrolisthesis of C4 on C5. Skull base and vertebrae: There is likely a mildly displaced acute fractures of the lateral mass of C1 on the right (series 6, image 26). Soft tissues and spinal canal: No prevertebral fluid or swelling. No visible canal hematoma. Disc levels:  No evidence of high-grade spinal canal stenosis. Upper chest: Negative. Other: None IMPRESSION: 1. No CT evidence of intracranial injury. 2. Likely mildly displaced acute fracture of the lateral mass of C1 on the right. Consider further evaluation with a cervical spine MRI. Electronically Signed   By: Lorenza Cambridge M.D.   On: 01/17/2023 11:16   CT Cervical Spine Wo Contrast  Result Date: 01/17/2023 CLINICAL DATA:  Headache, recent stroke, fall; Fall EXAM: CT HEAD WITHOUT CONTRAST CT CERVICAL SPINE WITHOUT CONTRAST TECHNIQUE: Multidetector CT imaging of the head and cervical spine was performed following the standard protocol without intravenous contrast. Multiplanar CT image reconstructions of the cervical spine were also generated. RADIATION DOSE REDUCTION: This exam was performed according to the departmental dose-optimization program which includes automated exposure control, adjustment of the mA and/or kV according to patient size and/or use of iterative reconstruction  technique. COMPARISON:  CT cervical spine 08/05/2007 FINDINGS: CT HEAD FINDINGS Brain: No evidence of acute infarction, hemorrhage, hydrocephalus, extra-axial collection or mass lesion/mass effect. Sequela of moderate chronic microvascular ischemic change. Likely chronic left cerebellar infarct. Vascular: No hyperdense vessel or unexpected calcification. Skull: Normal. Negative for fracture or focal lesion. Sinuses/Orbits: No middle ear or mastoid effusion. Paranasal sinuses are clear. Bilateral lens replacement. Orbits are otherwise unremarkable. Other: None. CT CERVICAL SPINE FINDINGS Alignment: Trace retrolisthesis of C4 on C5. Skull base and vertebrae: There is likely a mildly displaced acute fractures of the lateral mass of C1 on the right (series 6, image 26). Soft tissues and spinal canal: No prevertebral fluid or swelling. No visible canal hematoma. Disc levels:  No evidence of high-grade spinal canal stenosis. Upper chest: Negative. Other: None IMPRESSION: 1. No CT evidence of intracranial injury. 2. Likely mildly displaced acute fracture of the lateral mass of C1 on the right. Consider further evaluation with a cervical spine MRI. Electronically Signed   By: Lorenza Cambridge M.D.   On: 01/17/2023 11:16   DG Chest Port 1 View  Result Date: 01/17/2023 CLINICAL DATA:  Bruising from a fall EXAM: PORTABLE CHEST 1 VIEW COMPARISON:  None Available. FINDINGS: No consolidation, pneumothorax or effusion. No edema. Normal cardiopericardial silhouette. Overlapping cardiac leads. Lucency overlying the cardiac silhouette consistent with a hiatal hernia. IMPRESSION: Hiatal hernia.  No acute cardiopulmonary disease Electronically Signed   By: Karen Kays M.D.   On: 01/17/2023 10:34   DG Shoulder Right  Result Date: 01/17/2023 CLINICAL DATA:  Pain after fall EXAM: RIGHT SHOULDER - 2 VIEW COMPARISON:  None Available. FINDINGS: There is no evidence of fracture or dislocation. There is no evidence of arthropathy or  other focal bone abnormality. Soft tissues are unremarkable. Osteopenia. IMPRESSION: Osteopenia.  No acute osseous abnormality. Electronically Signed  By: Karen Kays M.D.   On: 01/17/2023 10:33   DG Tibia/Fibula Right  Result Date: 01/17/2023 CLINICAL DATA:  Bruise from a fall EXAM: RIGHT TIBIA AND FIBULA - 2 VIEW COMPARISON:  None Available. FINDINGS: No fracture or dislocation. Preserved bone mineralization. Note is made of chondrocalcinosis about the knee joint with some mild degenerative changes. IMPRESSION: No acute osseous abnormality Electronically Signed   By: Karen Kays M.D.   On: 01/17/2023 10:32    Procedures Procedures    Medications Ordered in ED Medications  acetaminophen (TYLENOL) tablet 500 mg (500 mg Oral Given 01/17/23 1202)    ED Course/ Medical Decision Making/ A&P                                 Medical Decision Making Amount and/or Complexity of Data Reviewed Labs: ordered. Radiology: ordered.  Risk OTC drugs. Decision regarding hospitalization.   Vianney Costabile Tobey 72 y.o. presented today for weakness. Working DDx that I considered at this time includes, but not limited to, CVA/TIA, electrolyte abnormalities, viral illness, anemia, dehydration, pneumonia, UTI, sepsis, tension headache, migraine peripheral neuropathy, DKA/HHS, drug-induced, ACS hypoglycemia, polypharmacy.  R/o DDx: CVA/TIA, electrolyte abnormalities, anemia, dehydration, pneumonia, UTI, sepsis, tension headache, migraine peripheral neuropathy, DKA/HHS, drug-induced, ACS hypoglycemia, polypharmacy: These are considered less likely due to history of present illness, physical exam, labs/imaging findings  Review of prior external notes: 11/10/2021 ED provider  Unique Tests and My Interpretation:  CBC: Unremarkable CBG: 170 BMP: Unremarkable Hepatic function panel:Unremarkable COVID: Positive UA: Unremarkable Troponin: 5 to 7 CT head without contrast: Unremarkable CT cervical spine without  contrast: Small C1 fracture Right tib-fib x-ray: Unremarkable Right shoulder x-ray: Unremarkable Chest x-ray: Unremarkable EKG: Sinus 91 bpm, left atrial enlargement, no ST elevations or blocks noted  Discussion with Independent Historian:  Husband  Discussion of Management of Tests:  Meyran, MD Neurosurgery  Risk: Medium: prescription drug management  Risk Stratification Score: None  Staffed with Tee, MD  Plan: On exam patient was in no acute distress but was noted to be hypertensive 180/75.  Patient did have tremors on exam throughout all extremities but otherwise had a reassuring neuroexam.  Patient did have bruising on her right shoulder and right tib-fib for which x-rays will be ordered.  Patient is concerned that her fall and headache this morning were related to having a stroke she has had this previously and is not currently anticoagulated.  Last known well was 11 PM last night and so patient is currently outside the window of tPA and low suspicion of stroke at this time so code stroke was not initiated but we will proceed with CT head and neck to further assess along with labs.  At time of initial evaluation patient did not have any midline tenderness but was endorsing generalized neck pain but was not placed in c-collar at this time.  Patient stable at this time.  CT came back showing possible acute C1 fracture as mild.  Hard c-collar ordered and neurosurg consulted to see if C-spine MR is indicated at this time with patient can follow-up in the outpatient setting.  Patient was COVID-positive which I suspect is causing patient's generalized weakness and what led to her fall and why she is having headache at this time.  Patient tested positive for COVID. Patient does qualify for Paxlovid as they are higher risk and it has been within 5 days of infection. Patient's kidney function  was reviewed to determine dosage along and medication interactions will be checked with the [facility]  interaction website if patient wants the Paxlovid. I spoke extensively with the patient about the risk and benefit of Paxlovid and patient determine they do not want the Paxlovid.  I spoke to the neurosurgeon on-call and he stated that patient is to be placed in c-collar and follow-up in the office and does not need an MRI as it is a very tiny cervical fracture.  This plan was relayed to the patient and patient verbalized her understanding acceptance of it.  Pending patient's urine analysis and delta troponin will discharge home.  Tylenol was ordered for patient's headache.  Rectal temp was obtained which shows patient is febrile 101.2 F.  Patient just received Tylenol before the rectal temp and will continue to monitor.  Patient does normally walk with a walker however is unable to ambulate at this time due to weakness.  Will reassess and if unable to walk or if fever continues to rise we will consider admission.  An hour later after the Tylenol rectal temp was rechecked and patient does have increased temp of 102.2 F.  Patient continues to feel weak and cannot walk.  Will admit to the hospitalist for weakness secondary to COVID.  Patient stable for admission.  I went spoke to the patient about being admitted for her weakness and patient stated that she does not want to be admitted.  Patient understands my concern of sending her home with weakness and having a fall with complications as patient has been having falls and patient with full decision-making capacity stated that she understands the risk of being discharged while being weaker than her baseline and accepts the risks.  Patient verbalized she still wants to be discharged as she has a wheelchair at home that she can use.  I encouraged patient to take Tylenol every 6 hours and to remain hydrated follow-up with her primary care provider and to return to ER if any of the symptoms change or worsen.  Of note when I went to reassess the patient  patient did state after taking the Tylenol she does feel better.        Final Clinical Impression(s) / ED Diagnoses Final diagnoses:  Weakness  COVID  Closed nondisplaced fracture of first cervical vertebra, unspecified fracture morphology, initial encounter [facility])    Rx / DC Orders ED Discharge Orders     None         Remi Deter 01/17/23 1446    Durwin Glaze, MD 01/18/23 1149

## 2023-01-17 NOTE — Discharge Instructions (Addendum)
Please follow-up with your primary care provider regarding recent symptoms and ER visit.  Today we discussed the possibility of admission however you declined and wanted to be treated in the outpatient setting.  Please take Tylenol every 6 hours as needed for pain remain hydrated follow-up with your primary care provider.  Today your CT scan also shows that you have a small fracture in your C1 vertebrae and will need to follow-up with a neurosurgeon.  Please keep the c-collar on at all times if any of your neck pain or symptoms they are worsen please return to the ER.  If symptoms change or worsen please return to ER.

## 2023-01-24 ENCOUNTER — Other Ambulatory Visit: Payer: Self-pay | Admitting: Gastroenterology

## 2023-01-24 DIAGNOSIS — K219 Gastro-esophageal reflux disease without esophagitis: Secondary | ICD-10-CM

## 2023-02-02 ENCOUNTER — Other Ambulatory Visit (HOSPITAL_COMMUNITY)
Admission: RE | Admit: 2023-02-02 | Discharge: 2023-02-02 | Disposition: A | Discharge status: Home / Self Care | Payer: 59 | Source: Ambulatory Visit | Attending: Family Medicine | Admitting: Family Medicine

## 2023-02-02 DIAGNOSIS — D649 Anemia, unspecified: Secondary | ICD-10-CM | POA: Insufficient documentation

## 2023-02-02 LAB — CBC WITH DIFFERENTIAL/PLATELET
Abs Immature Granulocytes: 0 10*3/uL (ref 0.00–0.07)
Band Neutrophils: 1 %
Basophils Absolute: 0 10*3/uL (ref 0.0–0.1)
Basophils Relative: 0 %
Eosinophils Absolute: 0 10*3/uL (ref 0.0–0.5)
Eosinophils Relative: 0 %
HCT: 38 % (ref 36.0–46.0)
Hemoglobin: 12.2 g/dL (ref 12.0–15.0)
Lymphocytes Relative: 39 %
Lymphs Abs: 3.1 10*3/uL (ref 0.7–4.0)
MCH: 26.8 pg (ref 26.0–34.0)
MCHC: 32.1 g/dL (ref 30.0–36.0)
MCV: 83.3 fL (ref 80.0–100.0)
Monocytes Absolute: 0.5 10*3/uL (ref 0.1–1.0)
Monocytes Relative: 6 %
Neutro Abs: 4.3 10*3/uL (ref 1.7–7.7)
Neutrophils Relative %: 54 %
Platelets: 348 10*3/uL (ref 150–400)
RBC: 4.56 MIL/uL (ref 3.87–5.11)
RDW: 14.8 % (ref 11.5–15.5)
WBC: 7.9 10*3/uL (ref 4.0–10.5)
nRBC: 0 % (ref 0.0–0.2)

## 2023-02-02 LAB — COMPREHENSIVE METABOLIC PANEL
ALT: 28 U/L (ref 0–44)
AST: 40 U/L (ref 15–41)
Albumin: 3.5 g/dL (ref 3.5–5.0)
Alkaline Phosphatase: 57 U/L (ref 38–126)
Anion gap: 9 (ref 5–15)
BUN: 12 mg/dL (ref 8–23)
CO2: 23 mmol/L (ref 22–32)
Calcium: 8.9 mg/dL (ref 8.9–10.3)
Chloride: 106 mmol/L (ref 98–111)
Creatinine, Ser: 1.11 mg/dL — ABNORMAL HIGH (ref 0.44–1.00)
GFR, Estimated: 52 mL/min — ABNORMAL LOW (ref >60)
Glucose, Bld: 119 mg/dL — ABNORMAL HIGH (ref 70–99)
Potassium: 4.6 mmol/L (ref 3.5–5.1)
Sodium: 138 mmol/L (ref 135–145)
Total Bilirubin: 0.4 mg/dL (ref 0.3–1.2)
Total Protein: 6.9 g/dL (ref 6.5–8.1)

## 2023-02-02 LAB — MAGNESIUM: Magnesium: 1.6 mg/dL — ABNORMAL LOW (ref 1.7–2.4)

## 2023-02-14 ENCOUNTER — Ambulatory Visit: Payer: 59 | Admitting: Internal Medicine

## 2023-02-14 ENCOUNTER — Encounter: Payer: Self-pay | Admitting: Internal Medicine

## 2023-02-14 VITALS — BP 115/78 | HR 76 | Temp 97.8°F | Ht 64.0 in | Wt 146.4 lb

## 2023-02-14 DIAGNOSIS — K5904 Chronic idiopathic constipation: Secondary | ICD-10-CM

## 2023-02-14 DIAGNOSIS — R1310 Dysphagia, unspecified: Secondary | ICD-10-CM | POA: Diagnosis not present

## 2023-02-14 DIAGNOSIS — R109 Unspecified abdominal pain: Secondary | ICD-10-CM

## 2023-02-14 DIAGNOSIS — K449 Diaphragmatic hernia without obstruction or gangrene: Secondary | ICD-10-CM

## 2023-02-14 DIAGNOSIS — R1319 Other dysphagia: Secondary | ICD-10-CM

## 2023-02-14 DIAGNOSIS — K5909 Other constipation: Secondary | ICD-10-CM

## 2023-02-14 DIAGNOSIS — K219 Gastro-esophageal reflux disease without esophagitis: Secondary | ICD-10-CM | POA: Diagnosis not present

## 2023-02-14 DIAGNOSIS — G8929 Other chronic pain: Secondary | ICD-10-CM

## 2023-02-14 MED ORDER — LINACLOTIDE 145 MCG PO CAPS: 145.0000 ug | ORAL_CAPSULE | Freq: Every day | ORAL | 3 refills | Status: DC

## 2023-02-14 NOTE — H&P (View-Only) (Signed)
Referring Provider: Mattie Marlin, DO Primary Care Physician:  Mattie Marlin, DO Primary GI:  Dr. Marletta Lor  Chief Complaint  Patient presents with   Follow-up    Pt following up after procedures    HPI:   Dana Romero is a 72 y.o. female who presents to clinic today for follow-up visit. She has a history of anxiety/depression, chronic pain, HTN, recurrent strokes now on Plavix (started around December 2023), diabetes, renal insufficiency, GERD, dysphagia with Schatzki's ring and esophageal dysmotility, chronic abdominal pain, chronic nausea/vomiting, chronic constipation.  Previous workup:  RUQ ultrasound 08/01/2022 with mild dilation of CBD and mild intrahepatic duct dilation likely due to prior cholecystectomy.   Barium pill esophagram completed 08/10/2022 showing tapered narrowing at GE junction obstructing 12.5 mm barium tablet consistent with stricture.  Appearance did not seem consistent with achalasia.  Large hiatal hernia with approximately half of the stomach in the chest.  Significant esophageal dysmotility.  EGD 11/13/2022 with large hiatal hernia, moderate Schatzki's ring in the distal esophagus dilated up to 19 mm with mild mucosal disruption and moderate improvement.  Ring then further disrupted with cold forceps.  Gastritis noted, biopsies negative for H. pylori.  Colonoscopy 11/13/2022 internal hemorrhoids, diverticulosis of the sigmoid colon, 8 mm tubular adenoma of the transverse colon.  Otherwise unremarkable.  Today:  Abdominal pain/nausea/vomiting: Chronic history of intermittent generalized abdominal pain and nausea, likely multifactorial in the setting of uncontrolled anxiety, chronic uncontrolled constipation, chronic GERD, and large hiatal hernia. Also with intermittent flares of RUQ/epigastric abdominal pain with vomiting about once a month.    GERD: Well controlled on Protonix 40 mg twice daily and avoiding trigger foods.   Dysphagia: Improved after most  recent dilation though now having issues again with feeling of food getting stuck.  Does state her pills are passing without issue now though food is becoming a problem again.   Constipation: Chronic.  Not adequately controlled with MiraLAX.  Given samples of Linzess 145 mcg daily previously which she states helped immensely.  Requesting prescription today.  Past Medical History:  Diagnosis Date   Angina at rest [facility])    Anxiety    Asthma    Caregiver stress 07/04/2011   Chronic pain syndrome    Degenerative disc disease, cervical    Depression    Essential hypertension 1998   GERD (gastroesophageal reflux disease)    Gout    Hiatal hernia    History of pancreatitis 06/08/2005   History of stroke    September 2020   N&V (nausea and vomiting) 04/13/2021   Pelvic mass    Renal insufficiency    Seasonal allergies    Sinusitis, chronic    Stroke (HCC)    Transfusion history    Type 2 diabetes mellitus (HCC) 1998    Past Surgical History:  Procedure Laterality Date   APPENDECTOMY     ARM DEBRIDEMENT Left    debridement of axilla "spider bite"   BALLOON DILATION N/A 06/30/2021   Procedure: BALLOON DILATION;  Surgeon: Lanelle Bal, DO;  [facility];  Service: Endoscopy;  Laterality: N/A;   BALLOON DILATION N/A 01/18/2022   Procedure: BALLOON DILATION;  Surgeon: Lanelle Bal, DO;  [facility];  Service: Endoscopy;  Laterality: N/A;   BALLOON DILATION N/A 11/13/2022   Procedure: BALLOON DILATION;  Surgeon: Lanelle Bal, DO;  [facility];  Service: Endoscopy;  Laterality: N/A;   BIOPSY  06/30/2021   Procedure:  BIOPSY;  Surgeon: Lanelle Bal, DO;  [facility];  Service: Endoscopy;;   BIOPSY  01/18/2022   Procedure: BIOPSY;  Surgeon: Lanelle Bal, DO;  [facility];  Service: Endoscopy;;   BIOPSY  11/13/2022   Procedure: BIOPSY;  Surgeon: Lanelle Bal, DO;  [facility];  Service:  Endoscopy;;   BREAST SURGERY     breast cyst x2 -last '12   CATARACT EXTRACTION W/PHACO Left 11/25/2021   Procedure: CATARACT EXTRACTION PHACO AND INTRAOCULAR LENS PLACEMENT (IOC);  Surgeon: Fabio Pierce, MD;  Location: [facility];  Service: Ophthalmology;  Laterality: Left;  CDE 4.11   CATARACT EXTRACTION W/PHACO Right 01/06/2022   Procedure: CATARACT EXTRACTION PHACO AND INTRAOCULAR LENS PLACEMENT (IOC);  Surgeon: Fabio Pierce, MD;  Location: [facility];  Service: Ophthalmology;  Laterality: Right;  CDE 4.97   CHOLECYSTECTOMY N/A 08/11/2015   Procedure: LAPAROSCOPIC CHOLECYSTECTOMY;  Surgeon: De Blanch Kinsinger, MD;  Location: [facility];  Service: General;  Laterality: N/A;   COLONOSCOPY WITH PROPOFOL N/A 11/13/2022   Procedure: COLONOSCOPY WITH PROPOFOL;  Surgeon: Lanelle Bal, DO;  [facility];  Service: Endoscopy;  Laterality: N/A;  8:30 am, asa 3   ESOPHAGOGASTRODUODENOSCOPY (EGD) WITH PROPOFOL N/A 06/30/2021   Surgeon: Lanelle Bal, DO;   Moderate size hiatal hernia, moderate Schatzki's ring dilated, gastritis biopsied, normal examined duodenum.  Pathology with gastric antral and oxyntic mucosa with nonspecific reactive gastropathy, negative for H. pylori.   ESOPHAGOGASTRODUODENOSCOPY (EGD) WITH PROPOFOL N/A 01/18/2022   Procedure: ESOPHAGOGASTRODUODENOSCOPY (EGD) WITH PROPOFOL;  Surgeon: Lanelle Bal, DO;  [facility];  Service: Endoscopy;  Laterality: N/A;  1:15 pm   ESOPHAGOGASTRODUODENOSCOPY (EGD) WITH PROPOFOL N/A 11/13/2022   Procedure: ESOPHAGOGASTRODUODENOSCOPY (EGD) WITH PROPOFOL;  Surgeon: Lanelle Bal, DO;  [facility];  Service: Endoscopy;  Laterality: N/A;   POLYPECTOMY  11/13/2022   Procedure: POLYPECTOMY;  Surgeon: Lanelle Bal, DO;  [facility];  Service: Endoscopy;;   TONSILLECTOMY     TONSILLECTOMY AND ADENOIDECTOMY     TUBAL LIGATION     post partum    Current Outpatient Medications  Medication Sig Dispense  Refill   ADVAIR HFA 115-21 MCG/ACT inhaler INHALE 2 PUFFS INTO LUNGS TWICE DAILY (Patient taking differently: Inhale 2 puffs into the lungs 2 (two) times daily as needed (shortness of breath or wheezing).) 12 Inhaler 0   albuterol (PROVENTIL) (2.5 MG/3ML) 0.083% nebulizer solution USE ONE VIAL IN NEBULIZER EVERY 6 HOURS AS NEEDED FOR WHEEZING OR SHORTNESS OF BREATH 75 mL 0   albuterol (VENTOLIN HFA) 108 (90 Base) MCG/ACT inhaler INHALE ONE TO TWO PUFFS BY MOUTH EVERY 6 HOURS AS NEEDED FOR WHEEZING OR SHORTNESS OF BREATH (OR PERSISTENT COUGHING) 18 each 2   Alcohol Swabs PADS Use to check blood sugar daily. E11.9 100 each 3   ALPRAZolam (XANAX) 1 MG tablet Take 0.5-1 mg by mouth See admin instructions. Take 1 tablet (1 mg) by mouth (scheduled) in the morning at bedtime & may take 0.5 mg additional if needed for anxiety.     amLODipine (NORVASC) 10 MG tablet Take 10 mg by mouth daily.     ARIPiprazole (ABILIFY) 5 MG tablet Take 5 mg by mouth at bedtime.     ascorbic acid (VITAMIN C) 500 MG tablet Take 500 mg by mouth daily.     blood glucose meter kit and supplies KIT Dispense based on patient and insurance preference. Use once a day.  DX: E11.9. 1 each 0   blood glucose meter kit and supplies Dispense based on patient and insurance preference. Use up to four times daily as directed. (FOR ICD-10 E11.9). 1 each 0   Blood Glucose Monitoring Suppl (FREESTYLE LITE) DEVI      carvedilol (COREG CR) 40 MG 24 hr capsule Take 1 capsule (40 mg total) by mouth daily. 90 capsule 3   cholecalciferol (VITAMIN D3) 25 MCG (1000 UNIT) tablet Take 1,000 Units by mouth daily.     EPINEPHrine (EPIPEN 2-PAK) 0.3 mg/0.3 mL IJ SOAJ injection Inject 0.3 mLs (0.3 mg total) into the muscle once. 1 Device 1   estradiol (VIVELLE-DOT) 0.05 MG/24HR patch Place 1 patch onto the skin 2 (two) times a week. Typically Tuesdays & Thursdays     FLUoxetine (PROZAC) 40 MG capsule Take 80 mg by mouth every evening.     fluticasone (FLONASE)  50 MCG/ACT nasal spray Place 2 sprays into both nostrils daily.     Garlic (GARLIQUE) 400 MG TBEC Take 400 mg by mouth daily.     glucose blood test strip Test blood sugar daily.  DX: E11.9 100 each 2   glucose monitoring kit (FREESTYLE) monitoring kit Test blood sugar daily. DX: E11.9 1 each 0   Lancet Devices (LANCING DEVICE) MISC Use to test blood sugar daily. E11.9 1 each 0   Lancets MISC Test blood sugar daily. DX: E11.9 100 each 2   levocetirizine (XYZAL) 5 MG tablet Take 5 mg by mouth every evening.     magnesium oxide (MAG-OX) 400 MG tablet Take 400 mg by mouth daily.     meclizine (ANTIVERT) 25 MG tablet Take 25 mg by mouth 3 (three) times daily as needed for dizziness or nausea.     medroxyPROGESTERone (PROVERA) 2.5 MG tablet TAKE ONE TABLET BY MOUTH ONCE DAILY 90 tablet 0   Menthol, Topical Analgesic, (BIOFREEZE) 4 % GEL Apply 1 application  topically as needed (pain).     olmesartan (BENICAR) 20 MG tablet Take 20 mg by mouth daily.     ondansetron (ZOFRAN) 8 MG tablet Take 1 tablet (8 mg total) by mouth every 8 (eight) hours as needed for nausea. 24 tablet 1   ondansetron (ZOFRAN-ODT) 4 MG disintegrating tablet Take 4 mg by mouth every 8 (eight) hours as needed for vomiting or nausea.     oxyCODONE-acetaminophen (PERCOCET) 10-325 MG tablet Take 1 tablet by mouth 4 (four) times daily as needed for pain.     pantoprazole (PROTONIX) 40 MG tablet TAKE 1 TABLET (40 MG TOTAL) BY MOUTH TWICE A DAY BEFORE MEALS 180 tablet 1   Propylene Glycol (SYSTANE BALANCE) 0.6 % SOLN Place 1 drop into both eyes in the morning, at noon, and at bedtime.     Semaglutide (OZEMPIC, 0.25 OR 0.5 MG/DOSE, [place]) Inject 0.5 mg into the skin once a week.     No current facility-administered medications for this visit.    Allergies as of 02/14/2023 - Review Complete 01/17/2023  Allergen Reaction Noted   Lyrica [pregabalin]  01/22/2020   Amlodipine besylate Other (See Comments)    Atorvastatin Other (See Comments)     Losartan potassium-hctz Nausea Only 02/28/2011   Morphine and codeine Other (See Comments) 08/11/2015   Niacin Other (See Comments) 10/12/2006   Rosuvastatin Other (See Comments)    Statins  05/11/2021   Toprol xl [metoprolol succinate] Other (See Comments) 02/28/2011   Vytorin [ezetimibe-simvastatin] Other (See Comments)    Latex Rash 06/06/2011  Valium [diazepam] Anxiety 04/12/2015    Family History  Problem Relation Age of Onset   Hypertension Mother    Depression Mother    Stomach cancer Mother 56   Hypertension Brother    Pancreatic cancer Brother    Diabetes Maternal Grandmother    Hypertension Maternal Grandmother    Breast cancer Maternal Grandmother    Heart attack Maternal Grandmother    Diabetes Maternal Aunt    Breast cancer Maternal Aunt    Diabetes Maternal Uncle    Hypertension Maternal Uncle    Hypertension Maternal Uncle    Stroke Other    Colon cancer Neg Hx     Social History   Socioeconomic History   Marital status: Single    Spouse name: Not on file   Number of children: Not on file   Years of education: Not on file   Highest education level: Not on file  Occupational History   Occupation: Retired  Tobacco Use   Smoking status: Never   Smokeless tobacco: Never  Vaping Use   Vaping status: Never Used  Substance and Sexual Activity   Alcohol use: No   Drug use: No   Sexual activity: Yes    Birth control/protection: Post-menopausal  Other Topics Concern   Not on file  Social History Narrative   Not on file   Social Determinants of Health   Financial Resource Strain: Low Risk  (01/19/2023)   Received from [facility]   Overall Financial Resource Strain (CARDIA)    Difficulty of Paying Living Expenses: Not hard at all  Food Insecurity: Low Risk  (01/24/2023)   Received from [facility]   Hunger Vital Sign    Worried About Running Out of Food in the Last Year: Never true    Ran Out of Food in the Last Year: Never true   Transportation Needs: No Transportation Needs (01/24/2023)   Received from [facility]    In the past 12 months, has lack of reliable transportation kept you from medical appointments, meetings, work or from getting things needed for daily living? : No  Physical Activity: Not on file  Stress: Not on file  Social Connections: Unknown (04/12/2022)   Received from [facility]   Social Network    Social Network: Not on file    Subjective: Review of Systems  Constitutional:  Negative for chills and fever.  HENT:  Negative for congestion and hearing loss.   Eyes:  Negative for blurred vision and double vision.  Respiratory:  Negative for cough and shortness of breath.   Cardiovascular:  Negative for chest pain and palpitations.  Gastrointestinal:  Positive for abdominal pain and heartburn. Negative for blood in stool, constipation, diarrhea, melena and vomiting.       Dysphagia  Genitourinary:  Negative for dysuria and urgency.  Musculoskeletal:  Negative for joint pain and myalgias.  Skin:  Negative for itching and rash.  Neurological:  Negative for dizziness and headaches.  Psychiatric/Behavioral:  Negative for depression. The patient is not nervous/anxious.      Objective: BP (!) 143/77   Pulse 76   Temp 97.8 F (36.6 C)   Ht 5\' 4"  (1.626 m)   Wt 146 lb 6.4 oz (66.4 kg)   LMP 03/15/2014   BMI 25.13 kg/m  Physical Exam Constitutional:      Appearance: Normal appearance.  HENT:     Head: Normocephalic and atraumatic.  Eyes:     Extraocular Movements:  Extraocular movements intact.     Conjunctiva/sclera: Conjunctivae normal.  Cardiovascular:     Rate and Rhythm: Normal rate and regular rhythm.  Pulmonary:     Effort: Pulmonary effort is normal.     Breath sounds: Normal breath sounds.  Abdominal:     General: Bowel sounds are normal.     Palpations: Abdomen is soft.  Musculoskeletal:        General: No swelling. Normal range of  motion.     Cervical back: Normal range of motion and neck supple.  Skin:    General: Skin is warm and dry.     Coloration: Skin is not jaundiced.  Neurological:     General: No focal deficit present.     Mental Status: She is alert and oriented to person, place, and time.  Psychiatric:        Mood and Affect: Mood normal.        Behavior: Behavior normal.      Assessment/Plan:  1.  Esophageal dysphagia-discussed case with Dr. Levon Hedger who is willing to perform upper endoscopy with savory dilation as patient may get more benefit in this regard.  We will arrange this.  2.  Chronic constipation-improved immensely on samples of Linzess.  Will send in formal prescription today to pharmacy.  3.  Chronic GERD-well-controlled on Protonix twice daily.  Will continue.  Patient to continue to avoid certain triggers.  4.  Chronic abdominal pain-likely multifactorial in the setting of uncontrolled anxiety, chronic uncontrolled constipation, chronic GERD, and large hiatal hernia.  Hopefully will improve as her constipation improves.  Follow-up in 3 to 4 months.  02/14/2023 2:51 PM   Disclaimer: This note was dictated with voice recognition software. Similar sounding words can inadvertently be transcribed and may not be corrected upon review.

## 2023-02-14 NOTE — Patient Instructions (Signed)
I will reach out to my partner about possibly pursuing upper endoscopy with a different dilator in regards to your esophageal stricture.  Continue on pantoprazole twice daily.  I will send in prescription for Linzess 145 mcg daily for your chronic constipation.  Linzess works best when taken once a day every day, on an empty stomach, at least 30 minutes before your first meal of the day.  When Linzess is taken daily as directed:  *Constipation relief is typically felt in about a week *IBS-C patients may begin to experience relief from belly pain and overall abdominal symptoms (pain, discomfort, and bloating) in about 1 week,   with symptoms typically improving over 12 weeks.  Diarrhea may occur in the first 2 weeks -keep taking it.  The diarrhea should go away and you should start having normal, complete, full bowel movements. It may be helpful to start treatment when you can be near the comfort of your own bathroom, such as a weekend.   Follow-up in 3 months.  It was very nice seeing both you today.  Dr. Marletta Lor

## 2023-02-14 NOTE — Progress Notes (Signed)
Referring Provider: Mattie Marlin, DO Primary Care Physician:  Mattie Marlin, DO Primary GI:  Dr. Marletta Lor  Chief Complaint  Patient presents with   Follow-up    Pt following up after procedures    HPI:   Dana Romero is a 72 y.o. female who presents to clinic today for follow-up visit. She has a history of anxiety/depression, chronic pain, HTN, recurrent strokes now on Plavix (started around December 2023), diabetes, renal insufficiency, GERD, dysphagia with Schatzki's ring and esophageal dysmotility, chronic abdominal pain, chronic nausea/vomiting, chronic constipation.  Previous workup:  RUQ ultrasound 08/01/2022 with mild dilation of CBD and mild intrahepatic duct dilation likely due to prior cholecystectomy.   Barium pill esophagram completed 08/10/2022 showing tapered narrowing at GE junction obstructing 12.5 mm barium tablet consistent with stricture.  Appearance did not seem consistent with achalasia.  Large hiatal hernia with approximately half of the stomach in the chest.  Significant esophageal dysmotility.  EGD 11/13/2022 with large hiatal hernia, moderate Schatzki's ring in the distal esophagus dilated up to 19 mm with mild mucosal disruption and moderate improvement.  Ring then further disrupted with cold forceps.  Gastritis noted, biopsies negative for H. pylori.  Colonoscopy 11/13/2022 internal hemorrhoids, diverticulosis of the sigmoid colon, 8 mm tubular adenoma of the transverse colon.  Otherwise unremarkable.  Today:  Abdominal pain/nausea/vomiting: Chronic history of intermittent generalized abdominal pain and nausea, likely multifactorial in the setting of uncontrolled anxiety, chronic uncontrolled constipation, chronic GERD, and large hiatal hernia. Also with intermittent flares of RUQ/epigastric abdominal pain with vomiting about once a month.    GERD: Well controlled on Protonix 40 mg twice daily and avoiding trigger foods.   Dysphagia: Improved after most  recent dilation though now having issues again with feeling of food getting stuck.  Does state her pills are passing without issue now though food is becoming a problem again.   Constipation: Chronic.  Not adequately controlled with MiraLAX.  Given samples of Linzess 145 mcg daily previously which she states helped immensely.  Requesting prescription today.  Past Medical History:  Diagnosis Date   Angina at rest [facility])    Anxiety    Asthma    Caregiver stress 07/04/2011   Chronic pain syndrome    Degenerative disc disease, cervical    Depression    Essential hypertension 1998   GERD (gastroesophageal reflux disease)    Gout    Hiatal hernia    History of pancreatitis 06/08/2005   History of stroke    September 2020   N&V (nausea and vomiting) 04/13/2021   Pelvic mass    Renal insufficiency    Seasonal allergies    Sinusitis, chronic    Stroke (HCC)    Transfusion history    Type 2 diabetes mellitus (HCC) 1998    Past Surgical History:  Procedure Laterality Date   APPENDECTOMY     ARM DEBRIDEMENT Left    debridement of axilla "spider bite"   BALLOON DILATION N/A 06/30/2021   Procedure: BALLOON DILATION;  Surgeon: Lanelle Bal, DO;  [facility];  Service: Endoscopy;  Laterality: N/A;   BALLOON DILATION N/A 01/18/2022   Procedure: BALLOON DILATION;  Surgeon: Lanelle Bal, DO;  [facility];  Service: Endoscopy;  Laterality: N/A;   BALLOON DILATION N/A 11/13/2022   Procedure: BALLOON DILATION;  Surgeon: Lanelle Bal, DO;  [facility];  Service: Endoscopy;  Laterality: N/A;   BIOPSY  06/30/2021   Procedure:  BIOPSY;  Surgeon: Lanelle Bal, DO;  [facility];  Service: Endoscopy;;   BIOPSY  01/18/2022   Procedure: BIOPSY;  Surgeon: Lanelle Bal, DO;  [facility];  Service: Endoscopy;;   BIOPSY  11/13/2022   Procedure: BIOPSY;  Surgeon: Lanelle Bal, DO;  [facility];  Service:  Endoscopy;;   BREAST SURGERY     breast cyst x2 -last '12   CATARACT EXTRACTION W/PHACO Left 11/25/2021   Procedure: CATARACT EXTRACTION PHACO AND INTRAOCULAR LENS PLACEMENT (IOC);  Surgeon: Fabio Pierce, MD;  Location: [facility];  Service: Ophthalmology;  Laterality: Left;  CDE 4.11   CATARACT EXTRACTION W/PHACO Right 01/06/2022   Procedure: CATARACT EXTRACTION PHACO AND INTRAOCULAR LENS PLACEMENT (IOC);  Surgeon: Fabio Pierce, MD;  Location: [facility];  Service: Ophthalmology;  Laterality: Right;  CDE 4.97   CHOLECYSTECTOMY N/A 08/11/2015   Procedure: LAPAROSCOPIC CHOLECYSTECTOMY;  Surgeon: De Blanch Kinsinger, MD;  Location: [facility];  Service: General;  Laterality: N/A;   COLONOSCOPY WITH PROPOFOL N/A 11/13/2022   Procedure: COLONOSCOPY WITH PROPOFOL;  Surgeon: Lanelle Bal, DO;  [facility];  Service: Endoscopy;  Laterality: N/A;  8:30 am, asa 3   ESOPHAGOGASTRODUODENOSCOPY (EGD) WITH PROPOFOL N/A 06/30/2021   Surgeon: Lanelle Bal, DO;   Moderate size hiatal hernia, moderate Schatzki's ring dilated, gastritis biopsied, normal examined duodenum.  Pathology with gastric antral and oxyntic mucosa with nonspecific reactive gastropathy, negative for H. pylori.   ESOPHAGOGASTRODUODENOSCOPY (EGD) WITH PROPOFOL N/A 01/18/2022   Procedure: ESOPHAGOGASTRODUODENOSCOPY (EGD) WITH PROPOFOL;  Surgeon: Lanelle Bal, DO;  [facility];  Service: Endoscopy;  Laterality: N/A;  1:15 pm   ESOPHAGOGASTRODUODENOSCOPY (EGD) WITH PROPOFOL N/A 11/13/2022   Procedure: ESOPHAGOGASTRODUODENOSCOPY (EGD) WITH PROPOFOL;  Surgeon: Lanelle Bal, DO;  [facility];  Service: Endoscopy;  Laterality: N/A;   POLYPECTOMY  11/13/2022   Procedure: POLYPECTOMY;  Surgeon: Lanelle Bal, DO;  [facility];  Service: Endoscopy;;   TONSILLECTOMY     TONSILLECTOMY AND ADENOIDECTOMY     TUBAL LIGATION     post partum    Current Outpatient Medications  Medication Sig Dispense  Refill   ADVAIR HFA 115-21 MCG/ACT inhaler INHALE 2 PUFFS INTO LUNGS TWICE DAILY (Patient taking differently: Inhale 2 puffs into the lungs 2 (two) times daily as needed (shortness of breath or wheezing).) 12 Inhaler 0   albuterol (PROVENTIL) (2.5 MG/3ML) 0.083% nebulizer solution USE ONE VIAL IN NEBULIZER EVERY 6 HOURS AS NEEDED FOR WHEEZING OR SHORTNESS OF BREATH 75 mL 0   albuterol (VENTOLIN HFA) 108 (90 Base) MCG/ACT inhaler INHALE ONE TO TWO PUFFS BY MOUTH EVERY 6 HOURS AS NEEDED FOR WHEEZING OR SHORTNESS OF BREATH (OR PERSISTENT COUGHING) 18 each 2   Alcohol Swabs PADS Use to check blood sugar daily. E11.9 100 each 3   ALPRAZolam (XANAX) 1 MG tablet Take 0.5-1 mg by mouth See admin instructions. Take 1 tablet (1 mg) by mouth (scheduled) in the morning at bedtime & may take 0.5 mg additional if needed for anxiety.     amLODipine (NORVASC) 10 MG tablet Take 10 mg by mouth daily.     ARIPiprazole (ABILIFY) 5 MG tablet Take 5 mg by mouth at bedtime.     ascorbic acid (VITAMIN C) 500 MG tablet Take 500 mg by mouth daily.     blood glucose meter kit and supplies KIT Dispense based on patient and insurance preference. Use once a day.  DX: E11.9. 1 each 0   blood glucose meter kit and supplies Dispense based on patient and insurance preference. Use up to four times daily as directed. (FOR ICD-10 E11.9). 1 each 0   Blood Glucose Monitoring Suppl (FREESTYLE LITE) DEVI      carvedilol (COREG CR) 40 MG 24 hr capsule Take 1 capsule (40 mg total) by mouth daily. 90 capsule 3   cholecalciferol (VITAMIN D3) 25 MCG (1000 UNIT) tablet Take 1,000 Units by mouth daily.     EPINEPHrine (EPIPEN 2-PAK) 0.3 mg/0.3 mL IJ SOAJ injection Inject 0.3 mLs (0.3 mg total) into the muscle once. 1 Device 1   estradiol (VIVELLE-DOT) 0.05 MG/24HR patch Place 1 patch onto the skin 2 (two) times a week. Typically Tuesdays & Thursdays     FLUoxetine (PROZAC) 40 MG capsule Take 80 mg by mouth every evening.     fluticasone (FLONASE)  50 MCG/ACT nasal spray Place 2 sprays into both nostrils daily.     Garlic (GARLIQUE) 400 MG TBEC Take 400 mg by mouth daily.     glucose blood test strip Test blood sugar daily.  DX: E11.9 100 each 2   glucose monitoring kit (FREESTYLE) monitoring kit Test blood sugar daily. DX: E11.9 1 each 0   Lancet Devices (LANCING DEVICE) MISC Use to test blood sugar daily. E11.9 1 each 0   Lancets MISC Test blood sugar daily. DX: E11.9 100 each 2   levocetirizine (XYZAL) 5 MG tablet Take 5 mg by mouth every evening.     magnesium oxide (MAG-OX) 400 MG tablet Take 400 mg by mouth daily.     meclizine (ANTIVERT) 25 MG tablet Take 25 mg by mouth 3 (three) times daily as needed for dizziness or nausea.     medroxyPROGESTERone (PROVERA) 2.5 MG tablet TAKE ONE TABLET BY MOUTH ONCE DAILY 90 tablet 0   Menthol, Topical Analgesic, (BIOFREEZE) 4 % GEL Apply 1 application  topically as needed (pain).     olmesartan (BENICAR) 20 MG tablet Take 20 mg by mouth daily.     ondansetron (ZOFRAN) 8 MG tablet Take 1 tablet (8 mg total) by mouth every 8 (eight) hours as needed for nausea. 24 tablet 1   ondansetron (ZOFRAN-ODT) 4 MG disintegrating tablet Take 4 mg by mouth every 8 (eight) hours as needed for vomiting or nausea.     oxyCODONE-acetaminophen (PERCOCET) 10-325 MG tablet Take 1 tablet by mouth 4 (four) times daily as needed for pain.     pantoprazole (PROTONIX) 40 MG tablet TAKE 1 TABLET (40 MG TOTAL) BY MOUTH TWICE A DAY BEFORE MEALS 180 tablet 1   Propylene Glycol (SYSTANE BALANCE) 0.6 % SOLN Place 1 drop into both eyes in the morning, at noon, and at bedtime.     Semaglutide (OZEMPIC, 0.25 OR 0.5 MG/DOSE, [place]) Inject 0.5 mg into the skin once a week.     No current facility-administered medications for this visit.    Allergies as of 02/14/2023 - Review Complete 01/17/2023  Allergen Reaction Noted   Lyrica [pregabalin]  01/22/2020   Amlodipine besylate Other (See Comments)    Atorvastatin Other (See Comments)     Losartan potassium-hctz Nausea Only 02/28/2011   Morphine and codeine Other (See Comments) 08/11/2015   Niacin Other (See Comments) 10/12/2006   Rosuvastatin Other (See Comments)    Statins  05/11/2021   Toprol xl [metoprolol succinate] Other (See Comments) 02/28/2011   Vytorin [ezetimibe-simvastatin] Other (See Comments)    Latex Rash 06/06/2011  Valium [diazepam] Anxiety 04/12/2015    Family History  Problem Relation Age of Onset   Hypertension Mother    Depression Mother    Stomach cancer Mother 56   Hypertension Brother    Pancreatic cancer Brother    Diabetes Maternal Grandmother    Hypertension Maternal Grandmother    Breast cancer Maternal Grandmother    Heart attack Maternal Grandmother    Diabetes Maternal Aunt    Breast cancer Maternal Aunt    Diabetes Maternal Uncle    Hypertension Maternal Uncle    Hypertension Maternal Uncle    Stroke Other    Colon cancer Neg Hx     Social History   Socioeconomic History   Marital status: Single    Spouse name: Not on file   Number of children: Not on file   Years of education: Not on file   Highest education level: Not on file  Occupational History   Occupation: Retired  Tobacco Use   Smoking status: Never   Smokeless tobacco: Never  Vaping Use   Vaping status: Never Used  Substance and Sexual Activity   Alcohol use: No   Drug use: No   Sexual activity: Yes    Birth control/protection: Post-menopausal  Other Topics Concern   Not on file  Social History Narrative   Not on file   Social Determinants of Health   Financial Resource Strain: Low Risk  (01/19/2023)   Received from [facility]   Overall Financial Resource Strain (CARDIA)    Difficulty of Paying Living Expenses: Not hard at all  Food Insecurity: Low Risk  (01/24/2023)   Received from [facility]   Hunger Vital Sign    Worried About Running Out of Food in the Last Year: Never true    Ran Out of Food in the Last Year: Never true   Transportation Needs: No Transportation Needs (01/24/2023)   Received from [facility]    In the past 12 months, has lack of reliable transportation kept you from medical appointments, meetings, work or from getting things needed for daily living? : No  Physical Activity: Not on file  Stress: Not on file  Social Connections: Unknown (04/12/2022)   Received from [facility]   Social Network    Social Network: Not on file    Subjective: Review of Systems  Constitutional:  Negative for chills and fever.  HENT:  Negative for congestion and hearing loss.   Eyes:  Negative for blurred vision and double vision.  Respiratory:  Negative for cough and shortness of breath.   Cardiovascular:  Negative for chest pain and palpitations.  Gastrointestinal:  Positive for abdominal pain and heartburn. Negative for blood in stool, constipation, diarrhea, melena and vomiting.       Dysphagia  Genitourinary:  Negative for dysuria and urgency.  Musculoskeletal:  Negative for joint pain and myalgias.  Skin:  Negative for itching and rash.  Neurological:  Negative for dizziness and headaches.  Psychiatric/Behavioral:  Negative for depression. The patient is not nervous/anxious.      Objective: BP (!) 143/77   Pulse 76   Temp 97.8 F (36.6 C)   Ht 5\' 4"  (1.626 m)   Wt 146 lb 6.4 oz (66.4 kg)   LMP 03/15/2014   BMI 25.13 kg/m  Physical Exam Constitutional:      Appearance: Normal appearance.  HENT:     Head: Normocephalic and atraumatic.  Eyes:     Extraocular Movements:  Extraocular movements intact.     Conjunctiva/sclera: Conjunctivae normal.  Cardiovascular:     Rate and Rhythm: Normal rate and regular rhythm.  Pulmonary:     Effort: Pulmonary effort is normal.     Breath sounds: Normal breath sounds.  Abdominal:     General: Bowel sounds are normal.     Palpations: Abdomen is soft.  Musculoskeletal:        General: No swelling. Normal range of  motion.     Cervical back: Normal range of motion and neck supple.  Skin:    General: Skin is warm and dry.     Coloration: Skin is not jaundiced.  Neurological:     General: No focal deficit present.     Mental Status: She is alert and oriented to person, place, and time.  Psychiatric:        Mood and Affect: Mood normal.        Behavior: Behavior normal.      Assessment/Plan:  1.  Esophageal dysphagia-discussed case with Dr. Levon Hedger who is willing to perform upper endoscopy with savory dilation as patient may get more benefit in this regard.  We will arrange this.  2.  Chronic constipation-improved immensely on samples of Linzess.  Will send in formal prescription today to pharmacy.  3.  Chronic GERD-well-controlled on Protonix twice daily.  Will continue.  Patient to continue to avoid certain triggers.  4.  Chronic abdominal pain-likely multifactorial in the setting of uncontrolled anxiety, chronic uncontrolled constipation, chronic GERD, and large hiatal hernia.  Hopefully will improve as her constipation improves.  Follow-up in 3 to 4 months.  02/14/2023 2:51 PM   Disclaimer: This note was dictated with voice recognition software. Similar sounding words can inadvertently be transcribed and may not be corrected upon review.

## 2023-02-21 ENCOUNTER — Telehealth: Payer: Self-pay | Admitting: *Deleted

## 2023-02-21 NOTE — Telephone Encounter (Signed)
Per Dr. Marletta Lor "Dana Romero, I talked to Dr. Levon Hedger about this patient and he is willing to perform upper endoscopy with savory dilation as she has not received long term benefit from balloon dilation from me. Can we set this up with him? ASA 3. Thank you"

## 2023-02-21 NOTE — Telephone Encounter (Signed)
Spoke with pt. Scheduled for 11/5. Aware will call back with pre-op appt. Instructions discussed in detail regarding EGD and holding ozempic 7 days.

## 2023-02-21 NOTE — Telephone Encounter (Signed)
Called pt and she is aware of pre-op appt details

## 2023-03-08 NOTE — Patient Instructions (Signed)
20    Your procedure is scheduled on: 03/13/2023  Report to [facility] Main Entrance at   7:45  AM.  Call this number if you have problems the morning of surgery: [phone]   Remember:   Follow instructions on letter from office regarding when to stop eating and drinking        No Smoking the day of procedure      Take these medicines the morning of surgery with A SIP OF WATER: xanax, Amlodipine, Abilify, Carvedilol, pantoprazole, and prozac  Flonase, xyzal, meclizine, and oxycodone if needed   Do not wear jewelry, make-up or nail polish.  Do not wear lotions, powders, or perfumes. You may wear deodorant.                Do not bring valuables to the hospital.  Contacts, dentures or bridgework may not be worn into surgery.  Leave suitcase in the car. After surgery it may be brought to your room.  For patients admitted to the hospital, checkout time is 11:00 AM the day of discharge.   Patients discharged the day of surgery will not be allowed to drive home. Upper Endoscopy, Adult Upper endoscopy is a procedure to look inside the upper GI (gastrointestinal) tract. The upper GI tract is made up of: The part of the body that moves food from your mouth to your stomach (esophagus). The stomach. The first part of your small intestine (duodenum). This procedure is also called esophagogastroduodenoscopy (EGD) or gastroscopy. In this procedure, your health care provider passes a thin, flexible tube (endoscope) through your mouth and down your esophagus into your stomach. A small camera is attached to the end of the tube. Images from the camera appear on a monitor in the exam room. During this procedure, your health care provider may also remove a small piece of tissue to be sent to a lab and examined under a microscope (biopsy). Your health care provider may do an upper endoscopy to diagnose cancers of the upper GI tract. You may also have this procedure to find the cause of other conditions, such  as: Stomach pain. Heartburn. Pain or problems when swallowing. Nausea and vomiting. Stomach bleeding. Stomach ulcers. Tell a health care provider about: Any allergies you have. All medicines you are taking, including vitamins, herbs, eye drops, creams, and over-the-counter medicines. Any problems you or family members have had with anesthetic medicines. Any blood disorders you have. Any surgeries you have had. Any medical conditions you have. Whether you are pregnant or may be pregnant. What are the risks? Generally, this is a safe procedure. However, problems may occur, including: Infection. Bleeding. Allergic reactions to medicines. A tear or hole (perforation) in the esophagus, stomach, or duodenum. What happens before the procedure? Staying hydrated Follow instructions from your health care provider about hydration, which may include: Up to 4 hours before the procedure - you may continue to drink clear liquids, such as water, clear fruit juice, black coffee, and plain tea.   Medicines Ask your health care provider about: Changing or stopping your regular medicines. This is especially important if you are taking diabetes medicines or blood thinners. Taking medicines such as aspirin and ibuprofen. These medicines can thin your blood. Do not take these medicines unless your health care provider tells you to take them. Taking over-the-counter medicines, vitamins, herbs, and supplements. General instructions Plan to have someone take you home from the hospital or clinic. If you will be going home right after the  procedure, plan to have someone with you for 24 hours. Ask your health care provider what steps will be taken to help prevent infection. What happens during the procedure?  An IV will be inserted into one of your veins. You may be given one or more of the following: A medicine to help you relax (sedative). A medicine to numb the throat (local anesthetic). You will lie  on your left side on an exam table. Your health care provider will pass the endoscope through your mouth and down your esophagus. Your health care provider will use the scope to check the inside of your esophagus, stomach, and duodenum. Biopsies may be taken. The endoscope will be removed. The procedure may vary among health care providers and hospitals. What happens after the procedure? Your blood pressure, heart rate, breathing rate, and blood oxygen level will be monitored until you leave the hospital or clinic. Do not drive for 24 hours if you were given a sedative during your procedure. When your throat is no longer numb, you may be given some fluids to drink. It is up to you to get the results of your procedure. Ask your health care provider, or the department that is doing the procedure, when your results will be ready. Summary Upper endoscopy is a procedure to look inside the upper GI tract. During the procedure, an IV will be inserted into one of your veins. You may be given a medicine to help you relax. A medicine will be used to numb your throat. The endoscope will be passed through your mouth and down your esophagus. This information is not intended to replace advice given to you by your health care provider. Make sure you discuss any questions you have with your health care provider. Document Revised: 10/17/2017 Document Reviewed: 09/24/2017 Elsevier Patient Education  2020 [employer]                                                                                                                                      EndoscopyCare After  Please read the instructions outlined below and refer to this sheet in the next few weeks. These discharge instructions provide you with general information on caring for yourself after you leave the hospital. Your doctor may also give you specific instructions. While your treatment has been planned according to the most current medical  practices available, unavoidable complications occasionally occur. If you have any problems or questions after discharge, please call your doctor. HOME CARE INSTRUCTIONS Activity You may resume your regular activity but move at a slower pace for the next 24 hours.  Take frequent rest periods for the next 24 hours.  Walking will help expel (get rid of) the air and reduce the bloated feeling in your abdomen.  No driving for 24 hours (because of the anesthesia (medicine) used during the test).  You may shower.  Do not sign any important legal documents  or operate any machinery for 24 hours (because of the anesthesia used during the test).  Nutrition Drink plenty of fluids.  You may resume your normal diet.  Begin with a light meal and progress to your normal diet.  Avoid alcoholic beverages for 24 hours or as instructed by your caregiver.  Medications You may resume your normal medications unless your caregiver tells you otherwise. What you can expect today You may experience abdominal discomfort such as a feeling of fullness or "gas" pains.  You may experience a sore throat for 2 to 3 days. This is normal. Gargling with salt water may help this.  Follow-up Your doctor will discuss the results of your test with you. SEEK IMMEDIATE MEDICAL CARE IF: You have excessive nausea (feeling sick to your stomach) and/or vomiting.  You have severe abdominal pain and distention (swelling).  You have trouble swallowing.  You have a temperature over 100 F (37.8 C).  You have rectal bleeding or vomiting of blood.  Document Released: 12/07/2003 Document Revised: 04/13/2011 Document Reviewed: 06/19/2007 Esophageal Dilatation Esophageal dilatation, also called esophageal dilation, is a procedure to widen or open a blocked or narrowed part of the esophagus. The esophagus is the part of the body that moves food and liquid from the mouth to the stomach. You may need this procedure if: You have a buildup of  scar tissue in your esophagus that makes it difficult, painful, or impossible to swallow. This can be caused by gastroesophageal reflux disease (GERD). You have cancer of the esophagus. There is a problem with how food moves through your esophagus. In some cases, you may need this procedure repeated at a later time to dilate the esophagus gradually. Tell a health care provider about: Any allergies you have. All medicines you are taking, including vitamins, herbs, eye drops, creams, and over-the-counter medicines. Any problems you or family members have had with anesthetic medicines. Any blood disorders you have. Any surgeries you have had. Any medical conditions you have. Any antibiotic medicines you are required to take before dental procedures. Whether you are pregnant or may be pregnant. What are the risks? Generally, this is a safe procedure. However, problems may occur, including: Bleeding due to a tear in the lining of the esophagus. A hole, or perforation, in the esophagus. What happens before the procedure? Ask your health care provider about: Changing or stopping your regular medicines. This is especially important if you are taking diabetes medicines or blood thinners. Taking medicines such as aspirin and ibuprofen. These medicines can thin your blood. Do not take these medicines unless your health care provider tells you to take them. Taking over-the-counter medicines, vitamins, herbs, and supplements. Follow instructions from your health care provider about eating or drinking restrictions. Plan to have a responsible adult take you home from the hospital or clinic. Plan to have a responsible adult care for you for the time you are told after you leave the hospital or clinic. This is important. What happens during the procedure? You may be given a medicine to help you relax (sedative). A numbing medicine may be sprayed into the back of your throat, or you may gargle the  medicine. Your health care provider may perform the dilatation using various surgical instruments, such as: Simple dilators. This instrument is carefully placed in the esophagus to stretch it. Guided wire bougies. This involves using an endoscope to insert a wire into the esophagus. A dilator is passed over this wire to enlarge the esophagus. Then the  wire is removed. Balloon dilators. An endoscope with a small balloon is inserted into the esophagus. The balloon is inflated to stretch the esophagus and open it up. The procedure may vary among health care providers and hospitals. What can I expect after the procedure? Your blood pressure, heart rate, breathing rate, and blood oxygen level will be monitored until you leave the hospital or clinic. Your throat may feel slightly sore and numb. This will get better over time. You will not be allowed to eat or drink until your throat is no longer numb. When you are able to drink, urinate, and sit on the edge of the bed without nausea or dizziness, you may be able to return home. Follow these instructions at home: Take over-the-counter and prescription medicines only as told by your health care provider. If you were given a sedative during the procedure, it can affect you for several hours. Do not drive or operate machinery until your health care provider says that it is safe. Plan to have a responsible adult care for you for the time you are told. This is important. Follow instructions from your health care provider about any eating or drinking restrictions. Do not use any products that contain nicotine or tobacco, such as cigarettes, e-cigarettes, and chewing tobacco. If you need help quitting, ask your health care provider. Keep all follow-up visits. This is important. Contact a health care provider if: You have a fever. You have pain that is not relieved by medicine. Get help right away if: You have chest pain. You have trouble breathing. You  have trouble swallowing. You vomit blood. You have black, tarry, or bloody stools. These symptoms may represent a serious problem that is an emergency. Do not wait to see if the symptoms will go away. Get medical help right away. Call your local emergency services (911 in the [place] Do not drive yourself to the hospital. Summary Esophageal dilatation, also called esophageal dilation, is a procedure to widen or open a blocked or narrowed part of the esophagus. Plan to have a responsible adult take you home from the hospital or clinic. For this procedure, a numbing medicine may be sprayed into the back of your throat, or you may gargle the medicine. Do not drive or operate machinery until your health care provider says that it is safe. This information is not intended to replace advice given to you by your health care provider. Make sure you discuss any questions you have with your health care provider. Document Revised: 09/10/2019 Document Reviewed: 09/10/2019 Elsevier Patient Education  2024 [employer].

## 2023-03-09 ENCOUNTER — Other Ambulatory Visit: Payer: Self-pay

## 2023-03-09 ENCOUNTER — Encounter (HOSPITAL_COMMUNITY): Payer: Self-pay

## 2023-03-09 ENCOUNTER — Encounter (HOSPITAL_COMMUNITY)
Admission: RE | Admit: 2023-03-09 | Discharge: 2023-03-09 | Disposition: A | Discharge status: Home / Self Care | Payer: 59 | Source: Ambulatory Visit | Attending: Gastroenterology | Admitting: Gastroenterology

## 2023-03-09 VITALS — BP 154/75 | HR 76 | Temp 97.8°F | Resp 18 | Ht 64.0 in | Wt 146.4 lb

## 2023-03-09 DIAGNOSIS — Z01818 Encounter for other preprocedural examination: Secondary | ICD-10-CM | POA: Diagnosis present

## 2023-03-09 DIAGNOSIS — N182 Chronic kidney disease, stage 2 (mild): Secondary | ICD-10-CM | POA: Diagnosis not present

## 2023-03-09 DIAGNOSIS — Z01812 Encounter for preprocedural laboratory examination: Secondary | ICD-10-CM | POA: Diagnosis not present

## 2023-03-09 LAB — BASIC METABOLIC PANEL
Anion gap: 9 (ref 5–15)
BUN: 16 mg/dL (ref 8–23)
CO2: 24 mmol/L (ref 22–32)
Calcium: 8.7 mg/dL — ABNORMAL LOW (ref 8.9–10.3)
Chloride: 104 mmol/L (ref 98–111)
Creatinine, Ser: 1.06 mg/dL — ABNORMAL HIGH (ref 0.44–1.00)
GFR, Estimated: 55 mL/min — ABNORMAL LOW (ref >60)
Glucose, Bld: 123 mg/dL — ABNORMAL HIGH (ref 70–99)
Potassium: 4.3 mmol/L (ref 3.5–5.1)
Sodium: 137 mmol/L (ref 135–145)

## 2023-03-09 NOTE — Progress Notes (Signed)
PAT visit completed. Pt verbalized understanding on procedure instructions and arrival time.   

## 2023-03-13 ENCOUNTER — Ambulatory Visit (HOSPITAL_COMMUNITY)
Admission: RE | Admit: 2023-03-13 | Discharge: 2023-03-13 | Disposition: A | Discharge status: Home / Self Care | Payer: 59 | Attending: Gastroenterology | Admitting: Gastroenterology

## 2023-03-13 ENCOUNTER — Encounter (HOSPITAL_COMMUNITY)
Admission: RE | Disposition: A | Discharge status: Home / Self Care | Payer: Self-pay | Source: Home / Self Care | Attending: Gastroenterology

## 2023-03-13 ENCOUNTER — Ambulatory Visit (HOSPITAL_COMMUNITY): Payer: 59 | Admitting: Certified Registered"

## 2023-03-13 ENCOUNTER — Ambulatory Visit (HOSPITAL_BASED_OUTPATIENT_CLINIC_OR_DEPARTMENT_OTHER): Payer: 59 | Admitting: Certified Registered"

## 2023-03-13 DIAGNOSIS — J45909 Unspecified asthma, uncomplicated: Secondary | ICD-10-CM | POA: Insufficient documentation

## 2023-03-13 DIAGNOSIS — K5710 Diverticulosis of small intestine without perforation or abscess without bleeding: Secondary | ICD-10-CM | POA: Diagnosis not present

## 2023-03-13 DIAGNOSIS — Z79899 Other long term (current) drug therapy: Secondary | ICD-10-CM | POA: Diagnosis not present

## 2023-03-13 DIAGNOSIS — F419 Anxiety disorder, unspecified: Secondary | ICD-10-CM | POA: Diagnosis not present

## 2023-03-13 DIAGNOSIS — R1310 Dysphagia, unspecified: Secondary | ICD-10-CM

## 2023-03-13 DIAGNOSIS — Z7985 Long-term (current) use of injectable non-insulin antidiabetic drugs: Secondary | ICD-10-CM | POA: Diagnosis not present

## 2023-03-13 DIAGNOSIS — I10 Essential (primary) hypertension: Secondary | ICD-10-CM | POA: Insufficient documentation

## 2023-03-13 DIAGNOSIS — R112 Nausea with vomiting, unspecified: Secondary | ICD-10-CM | POA: Insufficient documentation

## 2023-03-13 DIAGNOSIS — K5909 Other constipation: Secondary | ICD-10-CM | POA: Diagnosis not present

## 2023-03-13 DIAGNOSIS — R1084 Generalized abdominal pain: Secondary | ICD-10-CM | POA: Insufficient documentation

## 2023-03-13 DIAGNOSIS — K449 Diaphragmatic hernia without obstruction or gangrene: Secondary | ICD-10-CM | POA: Insufficient documentation

## 2023-03-13 DIAGNOSIS — Z8673 Personal history of transient ischemic attack (TIA), and cerebral infarction without residual deficits: Secondary | ICD-10-CM | POA: Diagnosis not present

## 2023-03-13 DIAGNOSIS — F413 Other mixed anxiety disorders: Secondary | ICD-10-CM | POA: Diagnosis not present

## 2023-03-13 DIAGNOSIS — F32A Depression, unspecified: Secondary | ICD-10-CM | POA: Insufficient documentation

## 2023-03-13 DIAGNOSIS — I209 Angina pectoris, unspecified: Secondary | ICD-10-CM | POA: Diagnosis not present

## 2023-03-13 DIAGNOSIS — E119 Type 2 diabetes mellitus without complications: Secondary | ICD-10-CM | POA: Insufficient documentation

## 2023-03-13 DIAGNOSIS — Z7902 Long term (current) use of antithrombotics/antiplatelets: Secondary | ICD-10-CM | POA: Diagnosis not present

## 2023-03-13 DIAGNOSIS — K219 Gastro-esophageal reflux disease without esophagitis: Secondary | ICD-10-CM | POA: Insufficient documentation

## 2023-03-13 HISTORY — PX: SAVORY DILATION: SHX5439

## 2023-03-13 HISTORY — PX: ESOPHAGOGASTRODUODENOSCOPY (EGD) WITH PROPOFOL: SHX5813

## 2023-03-13 LAB — GLUCOSE, CAPILLARY: Glucose-Capillary: 107 mg/dL — ABNORMAL HIGH (ref 70–99)

## 2023-03-13 SURGERY — ESOPHAGOGASTRODUODENOSCOPY (EGD) WITH PROPOFOL
Anesthesia: General

## 2023-03-13 MED ORDER — SODIUM CHLORIDE 0.9% FLUSH: 10.0000 mL | Freq: Two times a day (BID) | INTRAVENOUS | Status: DC

## 2023-03-13 MED ORDER — LIDOCAINE HCL (PF) 2 % IJ SOLN
INTRAMUSCULAR | Status: DC | PRN
  Administered 2023-03-13: 50 mg via INTRADERMAL

## 2023-03-13 MED ORDER — OXYCODONE HCL 5 MG PO TABS: 5.0000 mg | ORAL_TABLET | Freq: Once | ORAL | Status: DC | PRN

## 2023-03-13 MED ORDER — LACTATED RINGERS IV SOLN: INTRAVENOUS | Status: DC | PRN

## 2023-03-13 MED ORDER — MIDAZOLAM HCL 2 MG/2ML IJ SOLN
1.0000 mg | INTRAMUSCULAR | Status: DC | PRN
  Administered 2023-03-13: 1 mg via INTRAVENOUS
  Filled 2023-03-13: qty 2

## 2023-03-13 MED ORDER — PROPOFOL 500 MG/50ML IV EMUL
INTRAVENOUS | Status: DC | PRN
  Administered 2023-03-13: 150 ug/kg/min via INTRAVENOUS

## 2023-03-13 MED ORDER — FENTANYL CITRATE (PF) 100 MCG/2ML IJ SOLN: 25.0000 ug | INTRAMUSCULAR | Status: DC | PRN

## 2023-03-13 MED ORDER — ONDANSETRON HCL 4 MG/2ML IJ SOLN: 4.0000 mg | Freq: Once | INTRAMUSCULAR | Status: DC | PRN

## 2023-03-13 MED ORDER — PROPOFOL 10 MG/ML IV BOLUS
INTRAVENOUS | Status: DC | PRN
  Administered 2023-03-13: 100 mg via INTRAVENOUS
  Administered 2023-03-13: 50 mg via INTRAVENOUS

## 2023-03-13 MED ORDER — OXYCODONE HCL 5 MG/5ML PO SOLN: 5.0000 mg | Freq: Once | ORAL | Status: DC | PRN

## 2023-03-13 NOTE — Interval H&P Note (Signed)
History and Physical Interval Note:  03/13/2023 9:15 AM  Dana Romero  has presented today for surgery, with the diagnosis of dysphagia.  The various methods of treatment have been discussed with the patient and family. After consideration of risks, benefits and other options for treatment, the patient has consented to  Procedure(s) with comments: ESOPHAGOGASTRODUODENOSCOPY (EGD) WITH PROPOFOL (N/A) - 945am, asa 3 SAVORY DILATION (N/A) as a surgical intervention.  The patient's history has been reviewed, patient examined, no change in status, stable for surgery.  I have reviewed the patient's chart and labs.  Questions were answered to the patient's satisfaction.     [name]

## 2023-03-13 NOTE — Discharge Instructions (Signed)
You are being discharged to home.  Resume your previous diet.  Continue your present medications.  Consider speech and swallow evaluation if persistent symptoms.

## 2023-03-13 NOTE — Anesthesia Procedure Notes (Signed)
Date/Time: 03/13/2023 9:41 AM  Performed by: Julian Reil, CRNAPre-anesthesia Checklist: Patient identified, Emergency Drugs available, Suction available and Patient being monitored Patient Re-evaluated:Patient Re-evaluated prior to induction Oxygen Delivery Method: Nasal cannula Induction Type: IV induction Placement Confirmation: positive ETCO2

## 2023-03-13 NOTE — Transfer of Care (Signed)
Immediate Anesthesia Transfer of Care Note  Patient: Dana Romero  Procedure(s) Performed: ESOPHAGOGASTRODUODENOSCOPY (EGD) WITH PROPOFOL SAVORY DILATION  Patient Location: Short Stay  Anesthesia Type:General  Level of Consciousness: drowsy  Airway & Oxygen Therapy: Patient Spontanous Breathing  Post-op Assessment: Report given to RN and Post -op Vital signs reviewed and stable  Post vital signs: Reviewed and stable  Last Vitals:  Vitals Value Taken Time  BP 150/72 03/13/23 0952  Temp 36.4 C 03/13/23 0952  Pulse 57 03/13/23 0952  Resp 14 03/13/23 0952  SpO2 100 % 03/13/23 0952    Last Pain:  Vitals:   03/13/23 0952  TempSrc: Axillary  PainSc:          Complications: No notable events documented.

## 2023-03-13 NOTE — Anesthesia Preprocedure Evaluation (Signed)
Anesthesia Evaluation  Patient identified by MRN, date of birth, ID band Patient awake    Reviewed: Allergy & Precautions, H&P , NPO status , Patient's Chart, lab work & pertinent test results, reviewed documented beta blocker date and time   Airway Mallampati: II  TM Distance: >3 FB Neck ROM: full    Dental no notable dental hx.    Pulmonary neg pulmonary ROS, asthma    Pulmonary exam normal breath sounds clear to auscultation       Cardiovascular Exercise Tolerance: Good hypertension, + angina  negative cardio ROS  Rhythm:regular Rate:Normal     Neuro/Psych  PSYCHIATRIC DISORDERS Anxiety Depression     Neuromuscular disease CVA negative neurological ROS  negative psych ROS   GI/Hepatic negative GI ROS, Neg liver ROS, hiatal hernia,GERD  ,,  Endo/Other  negative endocrine ROSdiabetes    Renal/GU Renal diseasenegative Renal ROS  negative genitourinary   Musculoskeletal   Abdominal   Peds  Hematology negative hematology ROS (+)   Anesthesia Other Findings   Reproductive/Obstetrics negative OB ROS                             Anesthesia Physical Anesthesia Plan  ASA: 3  Anesthesia Plan: General   Post-op Pain Management:    Induction:   PONV Risk Score and Plan: Propofol infusion  Airway Management Planned:   Additional Equipment:   Intra-op Plan:   Post-operative Plan:   Informed Consent: I have reviewed the patients History and Physical, chart, labs and discussed the procedure including the risks, benefits and alternatives for the proposed anesthesia with the patient or authorized representative who has indicated his/her understanding and acceptance.     Dental Advisory Given  Plan Discussed with: CRNA  Anesthesia Plan Comments:        Anesthesia Quick Evaluation

## 2023-03-13 NOTE — Op Note (Signed)
[facility] Patient Name: Dana Romero Procedure Date: 03/13/2023 9:17 AM MRN: [id number] Date of Birth: [birth year] Attending MD: Katrinka Blazing , , [id number] CSN: [id number] Age: 72 Admit Type: Outpatient Procedure:                Upper GI endoscopy Indications:              Dysphagia Providers:                Katrinka Blazing, Sheran Fava, Kristine L.                            Jessee Avers, Technician Referring MD:              Medicines:                Monitored Anesthesia Care Complications:            No immediate complications. Estimated Blood Loss:     Estimated blood loss: none. Procedure:                Pre-Anesthesia Assessment:                           - Prior to the procedure, a History and Physical                            was performed, and patient medications, allergies                            and sensitivities were reviewed. The patient's                            tolerance of previous anesthesia was reviewed.                           - The risks and benefits of the procedure and the                            sedation options and risks were discussed with the                            patient. All questions were answered and informed                            consent was obtained.                           - ASA Grade Assessment: III - A patient with severe                            systemic disease.                           After obtaining informed consent, the endoscope was                            passed under direct vision. Throughout the  procedure, the patient's blood pressure, pulse, and                            oxygen saturations were monitored continuously. The                            [id number]-[id number] ([id number]) scope was introduced through the                            mouth, and advanced to the second part of duodenum.                            The upper GI endoscopy was accomplished without                             difficulty. The patient tolerated the procedure                            well. Scope In: 9:41:06 AM Scope Out: 9:49:36 AM Total Procedure Duration: 0 hours 8 minutes 30 seconds  Findings:      No endoscopic abnormality was evident in the esophagus to explain the       patient's complaint of dysphagia. It was decided, however, to proceed       with dilation of the entire esophagus. A guidewire was placed and the       scope was withdrawn. Dilation was performed with a Savary dilator with       mild resistance at 20 mm. The dilation site was examined following       endoscope reinsertion and showed mild mucosal disruption in the upper       esophagus.      A 7 cm hiatal hernia was found. The hiatal narrowing was 38 cm from the       incisors. The Z-line was 31 cm from the incisors.      The stomach was normal.      A medium non-bleeding diverticulum was found in the second portion of       the duodenum. Impression:               - No endoscopic esophageal abnormality to explain                            patient's dysphagia. Esophagus dilated. Dilated.                           - 7 cm hiatal hernia.                           - Normal stomach.                           - Non-bleeding duodenal diverticulum.                           - No specimens collected. Moderate Sedation:      Per Anesthesia Care Recommendation:           - Discharge patient to home (  ambulatory).                           - Resume previous diet.                           - Consider speech and swallow evaluation if                            persistent symptoms.                           - Query if dysphagia is related to large hiatal                            hernia.                           - Continue present medications. Procedure Code(s):        --- Professional ---                           [phone], Esophagogastroduodenoscopy, flexible,                            transoral; with insertion of  guide wire followed by                            passage of dilator(s) through esophagus over guide                            wire Diagnosis Code(s):        --- Professional ---                           R13.10, Dysphagia, unspecified                           K44.9, Diaphragmatic hernia without obstruction or                            gangrene                           K57.10, Diverticulosis of small intestine without                            perforation or abscess without bleeding CPT copyright 2022 American Medical Association. All rights reserved. The codes documented in this report are preliminary and upon coder review may  be revised to meet current compliance requirements. Katrinka Blazing, MD Katrinka Blazing,  03/13/2023 9:58:44 AM This report has been signed electronically. Number of Addenda: 0

## 2023-03-14 NOTE — Anesthesia Postprocedure Evaluation (Signed)
Anesthesia Post Note  Patient: Dana Romero  Procedure(s) Performed: ESOPHAGOGASTRODUODENOSCOPY (EGD) WITH PROPOFOL SAVORY DILATION  Patient location during evaluation: Phase II Anesthesia Type: General Level of consciousness: awake Pain management: pain level controlled Vital Signs Assessment: post-procedure vital signs reviewed and stable Respiratory status: spontaneous breathing and respiratory function stable Cardiovascular status: blood pressure returned to baseline and stable Postop Assessment: no headache and no apparent nausea or vomiting Anesthetic complications: no Comments: Late entry   No notable events documented.   Last Vitals:  Vitals:   03/13/23 0952 03/13/23 1000  BP: (!) 150/72 (!) 147/81  Pulse: (!) 57   Resp: 14   Temp: 36.4 C   SpO2: 100%     Last Pain:  Vitals:   03/13/23 1004  TempSrc:   PainSc: 4                  Windell Norfolk

## 2023-03-19 ENCOUNTER — Encounter (HOSPITAL_COMMUNITY): Payer: Self-pay | Admitting: Gastroenterology

## 2023-05-17 ENCOUNTER — Other Ambulatory Visit: Payer: Self-pay | Admitting: *Deleted

## 2023-05-17 ENCOUNTER — Ambulatory Visit: Payer: 59 | Admitting: Gastroenterology

## 2023-05-17 ENCOUNTER — Encounter: Payer: Self-pay | Admitting: Gastroenterology

## 2023-05-17 VITALS — BP 136/77 | HR 69 | Temp 97.5°F | Ht 64.0 in | Wt 152.8 lb

## 2023-05-17 DIAGNOSIS — R1310 Dysphagia, unspecified: Secondary | ICD-10-CM

## 2023-05-17 DIAGNOSIS — K5900 Constipation, unspecified: Secondary | ICD-10-CM

## 2023-05-17 DIAGNOSIS — K449 Diaphragmatic hernia without obstruction or gangrene: Secondary | ICD-10-CM

## 2023-05-17 NOTE — Patient Instructions (Signed)
 We are referring you to a surgeon to discuss hiatal hernia repair.   Let's try the Linzess  at the smallest dosage. Linzess  works best when taken once a day every day, on an empty stomach, at least 30 minutes before your first meal of the day.  When Linzess  is taken daily as directed:  *Constipation relief is typically felt in about a week *IBS-C patients may begin to experience relief from belly pain and overall abdominal symptoms (pain, discomfort, and bloating) in about 1 week,   with symptoms typically improving over 12 weeks.  Diarrhea may occur in the first 2 weeks -keep taking it.  The diarrhea should go away and you should start having normal, complete, full bowel movements. It may be helpful to start treatment when you can be near the comfort of your own bathroom, such as a weekend.   Let me know how it works for you!   We will see you in 3 months!   It was a pleasure to see you today. I want to create trusting relationships with patients and provide genuine, compassionate, and quality care. I truly value your feedback, so please be on the lookout for a survey regarding your visit with me today. I appreciate your time in completing this!         Therisa MICAEL Stager, PhD, ANP-BC [facility]

## 2023-05-17 NOTE — Progress Notes (Signed)
 Gastroenterology Office Note     Primary Care Physician:  Macarthur Elouise SQUIBB, DO  Primary Gastroenterologist: Dr. Cindie    Chief Complaint   Chief Complaint  Patient presents with   Dysphagia    Follow up on dysphagia. Feels like it has gotten worse last couple of weeks. Having some nausea.      History of Present Illness   Dana Romero is a 73 y.o. female presenting today with a history of GERD, dysphagia, chronic abdominal pain, N/V, chronic constipation, recently undergoing early interval EGD with dilation  Nov 2024 with empiric dilation.   Had brief improvement but now dysphagia returned. BPE in April 2024 with large hiatal hernia and 50% of stomach in inferior mediastinum. EGD recently completed also with large hiatal hernia.   She remains on pantoprazole  BID.    Linzess  145 mcg worked well the first dose then had significant diarrhea. BM every 3 days.     EGD Nov 2024: no esophageal abnormality to explain dysphagia, empiric dilation, 7 cm hiatal hernia, non-bleeding duodenal diverticulum.   EGD 11/13/2022 with large hiatal hernia, moderate Schatzki's ring in the distal esophagus dilated up to 19 mm with mild mucosal disruption and moderate improvement.  Ring then further disrupted with cold forceps.  Gastritis noted, biopsies negative for H. pylori.   Colonoscopy 11/13/2022 internal hemorrhoids, diverticulosis of the sigmoid colon, 8 mm tubular adenoma of the transverse colon.  Otherwise unremarkable.    Past Medical History:  Diagnosis Date   Angina at rest [facility])    Anxiety    Asthma    Caregiver stress 07/04/2011   Chronic pain syndrome    Degenerative disc disease, cervical    Depression    Essential hypertension 1998   GERD (gastroesophageal reflux disease)    Gout    Hiatal hernia    History of pancreatitis 06/08/2005   History of stroke    September 2020   N&V (nausea and vomiting) 04/13/2021   Pelvic mass    Renal insufficiency     Seasonal allergies    Sinusitis, chronic    Stroke (HCC)    Transfusion history    Type 2 diabetes mellitus (HCC) 1998    Past Surgical History:  Procedure Laterality Date   APPENDECTOMY     ARM DEBRIDEMENT Left    debridement of axilla spider bite   BALLOON DILATION N/A 06/30/2021   Procedure: BALLOON DILATION;  Surgeon: Cindie Carlin POUR, DO;  [facility];  Service: Endoscopy;  Laterality: N/A;   BALLOON DILATION N/A 01/18/2022   Procedure: BALLOON DILATION;  Surgeon: Cindie Carlin POUR, DO;  [facility];  Service: Endoscopy;  Laterality: N/A;   BALLOON DILATION N/A 11/13/2022   Procedure: BALLOON DILATION;  Surgeon: Cindie Carlin POUR, DO;  [facility];  Service: Endoscopy;  Laterality: N/A;   BIOPSY  06/30/2021   Procedure: BIOPSY;  Surgeon: Cindie Carlin POUR, DO;  [facility];  Service: Endoscopy;;   BIOPSY  01/18/2022   Procedure: BIOPSY;  Surgeon: Cindie Carlin POUR, DO;  [facility];  Service: Endoscopy;;   BIOPSY  11/13/2022   Procedure: BIOPSY;  Surgeon: Cindie Carlin POUR, DO;  [facility];  Service: Endoscopy;;   BREAST SURGERY     breast cyst x2 -last '12   CATARACT EXTRACTION W/PHACO Left 11/25/2021   Procedure: CATARACT EXTRACTION PHACO AND INTRAOCULAR LENS PLACEMENT (IOC);  Surgeon: Harrie Agent, MD;  Location: [facility];  Service: Ophthalmology;  Laterality: Left;  CDE 4.11   CATARACT EXTRACTION W/PHACO Right 01/06/2022   Procedure: CATARACT EXTRACTION PHACO AND INTRAOCULAR LENS PLACEMENT (IOC);  Surgeon: Harrie Agent, MD;  Location: [facility];  Service: Ophthalmology;  Laterality: Right;  CDE 4.97   CHOLECYSTECTOMY N/A 08/11/2015   Procedure: LAPAROSCOPIC CHOLECYSTECTOMY;  Surgeon: Herlene Righter Kinsinger, MD;  Location: [facility];  Service: General;  Laterality: N/A;   COLONOSCOPY WITH PROPOFOL  N/A 11/13/2022   Procedure: COLONOSCOPY WITH PROPOFOL ;  Surgeon: Cindie Carlin POUR, DO;  [facility];   Service: Endoscopy;  Laterality: N/A;  8:30 am, asa 3   ESOPHAGOGASTRODUODENOSCOPY (EGD) WITH PROPOFOL  N/A 06/30/2021   Surgeon: Cindie Carlin POUR, DO;   Moderate size hiatal hernia, moderate Schatzki's ring dilated, gastritis biopsied, normal examined duodenum.  Pathology with gastric antral and oxyntic mucosa with nonspecific reactive gastropathy, negative for H. pylori.   ESOPHAGOGASTRODUODENOSCOPY (EGD) WITH PROPOFOL  N/A 01/18/2022   Procedure: ESOPHAGOGASTRODUODENOSCOPY (EGD) WITH PROPOFOL ;  Surgeon: Cindie Carlin POUR, DO;  [facility];  Service: Endoscopy;  Laterality: N/A;  1:15 pm   ESOPHAGOGASTRODUODENOSCOPY (EGD) WITH PROPOFOL  N/A 11/13/2022   Procedure: ESOPHAGOGASTRODUODENOSCOPY (EGD) WITH PROPOFOL ;  Surgeon: Cindie Carlin POUR, DO;  [facility];  Service: Endoscopy;  Laterality: N/A;   ESOPHAGOGASTRODUODENOSCOPY (EGD) WITH PROPOFOL  N/A 03/13/2023   Procedure: ESOPHAGOGASTRODUODENOSCOPY (EGD) WITH PROPOFOL ;  Surgeon: Eartha Angelia Sieving, MD;  [facility];  Service: Gastroenterology;  Laterality: N/A;  945am, asa 3   POLYPECTOMY  11/13/2022   Procedure: POLYPECTOMY;  Surgeon: Cindie Carlin POUR, DO;  [facility];  Service: Endoscopy;;   SAVORY DILATION N/A 03/13/2023   Procedure: SAVORY DILATION;  Surgeon: Eartha Angelia Sieving, MD;  [facility];  Service: Gastroenterology;  Laterality: N/A;   TONSILLECTOMY     TONSILLECTOMY AND ADENOIDECTOMY     TUBAL LIGATION     post partum    Current Outpatient Medications  Medication Sig Dispense Refill   ADVAIR HFA 115-21 MCG/ACT inhaler INHALE 2 PUFFS INTO LUNGS TWICE DAILY (Patient taking differently: Inhale 2 puffs into the lungs 2 (two) times daily as needed (shortness of breath or wheezing).) 12 Inhaler 0   albuterol  (PROVENTIL ) (2.5 MG/3ML) 0.083% nebulizer solution USE ONE VIAL IN NEBULIZER EVERY 6 HOURS AS NEEDED FOR WHEEZING OR SHORTNESS OF BREATH 75 mL 0   albuterol   (VENTOLIN  HFA) 108 (90 Base) MCG/ACT inhaler INHALE ONE TO TWO PUFFS BY MOUTH EVERY 6 HOURS AS NEEDED FOR WHEEZING OR SHORTNESS OF BREATH (OR PERSISTENT COUGHING) 18 each 2   Alcohol  Swabs  PADS Use to check blood sugar daily. E11.9 100 each 3   ALPRAZolam  (XANAX ) 1 MG tablet Take 0.5-1 mg by mouth See admin instructions. Take 1 tablet (1 mg) by mouth (scheduled) in the morning at bedtime & may take 0.5 mg additional if needed for anxiety.     ARIPiprazole  (ABILIFY ) 5 MG tablet Take 5 mg by mouth at bedtime.     ascorbic acid (VITAMIN C) 500 MG tablet Take 500 mg by mouth daily.     blood glucose meter kit and supplies KIT Dispense based on patient and insurance preference. Use once a day.  DX: E11.9. 1 each 0   blood glucose meter kit and supplies Dispense based on patient and insurance preference. Use up to four times daily as directed. (FOR ICD-10 E11.9). 1 each 0   Blood Glucose Monitoring Suppl (FREESTYLE LITE) DEVI  carvedilol  (COREG  CR) 40 MG 24 hr capsule Take 1 capsule (40 mg total) by mouth daily. 90 capsule 3   cholecalciferol (VITAMIN D3) 25 MCG (1000 UNIT) tablet Take 1,000 Units by mouth daily.     EPINEPHrine  (EPIPEN  2-PAK) 0.3 mg/0.3 mL IJ SOAJ injection Inject 0.3 mLs (0.3 mg total) into the muscle once. 1 Device 1   estradiol  (VIVELLE -DOT) 0.05 MG/24HR patch Place 1 patch onto the skin 2 (two) times a week. Typically Tuesdays & Thursdays     FLUoxetine  (PROZAC ) 40 MG capsule Take 80 mg by mouth every evening.     fluticasone  (FLONASE ) 50 MCG/ACT nasal spray Place 2 sprays into both nostrils daily.     Garlic (GARLIQUE) 400 MG TBEC Take 400 mg by mouth daily.     glucose blood test strip Test blood sugar daily.  DX: E11.9 100 each 2   glucose monitoring kit (FREESTYLE) monitoring kit Test blood sugar daily. DX: E11.9 1 each 0   Lancet Devices (LANCING DEVICE) MISC Use to test blood sugar daily. E11.9 1 each 0   Lancets MISC Test blood sugar daily. DX: E11.9 100 each 2    magnesium  oxide (MAG-OX) 400 MG tablet Take 400 mg by mouth daily.     meclizine  (ANTIVERT ) 25 MG tablet Take 25 mg by mouth 3 (three) times daily as needed for dizziness or nausea.     medroxyPROGESTERone  (PROVERA ) 2.5 MG tablet TAKE ONE TABLET BY MOUTH ONCE DAILY 90 tablet 0   Menthol, Topical Analgesic, (BIOFREEZE) 4 % GEL Apply 1 application  topically as needed (pain).     olmesartan  (BENICAR ) 20 MG tablet Take 20 mg by mouth daily.     ondansetron  (ZOFRAN -ODT) 4 MG disintegrating tablet Take 4 mg by mouth every 8 (eight) hours as needed for vomiting or nausea.     oxyCODONE -acetaminophen  (PERCOCET) 10-325 MG tablet Take 1 tablet by mouth 4 (four) times daily as needed for pain.     pantoprazole  (PROTONIX ) 40 MG tablet TAKE 1 TABLET (40 MG TOTAL) BY MOUTH TWICE A DAY BEFORE MEALS 180 tablet 1   Propylene Glycol (SYSTANE BALANCE) 0.6 % SOLN Place 1 drop into both eyes in the morning, at noon, and at bedtime.     Semaglutide (OZEMPIC, 0.25 OR 0.5 MG/DOSE, [place]) Inject 0.5 mg into the skin once a week.     amLODipine (NORVASC) 10 MG tablet Take 10 mg by mouth daily. (Patient not taking: Reported on 05/17/2023)     linaclotide  (LINZESS ) 145 MCG CAPS capsule Take 1 capsule (145 mcg total) by mouth daily before breakfast. (Patient not taking: Reported on 05/17/2023) 90 capsule 3   ondansetron  (ZOFRAN ) 8 MG tablet Take 1 tablet (8 mg total) by mouth every 8 (eight) hours as needed for nausea. (Patient not taking: Reported on 05/17/2023) 24 tablet 1   No current facility-administered medications for this visit.    Allergies as of 05/17/2023 - Review Complete 05/17/2023  Allergen Reaction Noted   Lyrica [pregabalin]  01/22/2020   Amlodipine besylate Other (See Comments)    Atorvastatin Other (See Comments)    Losartan  potassium-hctz Nausea Only 02/28/2011   Morphine and codeine Other (See Comments) 08/11/2015   Niacin Other (See Comments) 10/12/2006   Rosuvastatin Other (See Comments)    Statins   05/11/2021   Toprol xl [metoprolol succinate] Other (See Comments) 02/28/2011   Vytorin [ezetimibe -simvastatin] Other (See Comments)    Latex Rash 06/06/2011   Valium  [diazepam ] Anxiety 04/12/2015    Family History  Problem Relation Age of Onset   Hypertension Mother    Depression Mother    Stomach cancer Mother 76   Hypertension Brother    Pancreatic cancer Brother    Diabetes Maternal Grandmother    Hypertension Maternal Grandmother    Breast cancer Maternal Grandmother    Heart attack Maternal Grandmother    Diabetes Maternal Aunt    Breast cancer Maternal Aunt    Diabetes Maternal Uncle    Hypertension Maternal Uncle    Hypertension Maternal Uncle    Stroke Other    Colon cancer Neg Hx     Social History   Socioeconomic History   Marital status: Single    Spouse name: Not on file   Number of children: Not on file   Years of education: Not on file   Highest education level: Not on file  Occupational History   Occupation: Retired  Tobacco Use   Smoking status: Never   Smokeless tobacco: Never  Vaping Use   Vaping status: Never Used  Substance and Sexual Activity   Alcohol  use: No   Drug use: No   Sexual activity: Yes    Birth control/protection: Post-menopausal  Other Topics Concern   Not on file  Social History Narrative   Not on file   Social Drivers of Health   Financial Resource Strain: Low Risk  (01/19/2023)   Received from [facility]   Overall Financial Resource Strain (CARDIA)    Difficulty of Paying Living Expenses: Not hard at all  Food Insecurity: Low Risk  (01/24/2023)   Received from [facility]   Hunger Vital Sign    Worried About Running Out of Food in the Last Year: Never true    Ran Out of Food in the Last Year: Never true  Transportation Needs: No Transportation Needs (01/24/2023)   Received from [facility]    In the past 12 months, has lack of reliable transportation kept you from medical appointments,  meetings, work or from getting things needed for daily living? : No  Physical Activity: Not on file  Stress: Not on file  Social Connections: Unknown (04/12/2022)   Received from [facility]   Social Network    Social Network: Not on file  Intimate Partner Violence: Unknown (04/12/2022)   Received from [facility]   HITS    Physically Hurt: Not on file    Insult or Talk Down To: Not on file    Threaten Physical Harm: Not on file    Scream or Curse: Not on file     Review of Systems   Gen: Denies any fever, chills, fatigue, weight loss, lack of appetite.  CV: Denies chest pain, heart palpitations, peripheral edema, syncope.  Resp: Denies shortness of breath at rest or with exertion. Denies wheezing or cough.  GI: Denies dysphagia or odynophagia. Denies jaundice, hematemesis, fecal incontinence. GU : Denies urinary burning, urinary frequency, urinary hesitancy MS: Denies joint pain, muscle weakness, cramps, or limitation of movement.  Derm: Denies rash, itching, dry skin Psych: Denies depression, anxiety, memory loss, and confusion Heme: Denies bruising, bleeding, and enlarged lymph nodes.   Physical Exam   BP 136/77   Pulse 69   Temp (!) 97.5 F (36.4 C)   Ht 5' 4 (1.626 m)   Wt 152 lb 12.8 oz (69.3 kg)   LMP 03/15/2014   BMI 26.23 kg/m  General:   Alert and oriented. Pleasant and cooperative. Well-nourished and  well-developed.  Head:  Normocephalic and atraumatic. Eyes:  Without icterus Abdomen:  +BS, soft, non-tender and non-distended. No HSM noted. No guarding or rebound. No masses appreciated.  Rectal:  Deferred  Msk:  Symmetrical without gross deformities. Normal posture. Extremities:  Without edema. Neurologic:  Alert and  oriented x4;  grossly normal neurologically. Skin:  Intact without significant lesions or rashes. Psych:  Alert and cooperative. Normal mood and affect.   Assessment   Dana Romero is a 73 y.o. female  presenting today with a history of GERD, dysphagia, chronic abdominal pain, N/V, chronic constipation, recently undergoing early interval EGD with dilation  Nov 2024 with empiric dilation.   Dysphagia only briefly improved s/p dilation and felt secondary to known large hiatal hernia. Prior BPE in April 2024 with large hiatal hernia and 50% of stomach in inferior mediastinum. Pantoprazole  BID. Unfortunately, she will need to be referred to surgeon for consideration of hiatal hernia repair, as she continues to have difficulties with dysphagia, nausea, all despite behavior modification and high dose PPI.   Constipation: Linzess  145 mcg overshooting the mark. Will trial 72 mcg.      PLAN    PPI BID Linzess  72 mcg samples Referral to surgeon 3 month follow-up   Therisa MICAEL Stager, PhD, ANP-BC [facility]

## 2023-06-08 ENCOUNTER — Telehealth: Payer: Self-pay

## 2023-06-08 NOTE — Telephone Encounter (Signed)
Dana Romero, Pt called and she was [facility] and [facility] would not see her. She wants to know what to do to get back in. Please advise

## 2023-06-11 NOTE — Telephone Encounter (Signed)
Please refer back to [facility]. Thanks!

## 2023-06-11 NOTE — Telephone Encounter (Signed)
REFERRAL SENT BACK TO [facility]

## 2023-06-11 NOTE — Telephone Encounter (Signed)
 noted

## 2023-06-27 ENCOUNTER — Ambulatory Visit: Payer: Self-pay | Admitting: General Surgery

## 2023-06-27 DIAGNOSIS — E1142 Type 2 diabetes mellitus with diabetic polyneuropathy: Secondary | ICD-10-CM

## 2023-07-09 NOTE — Progress Notes (Addendum)
 Anesthesia Review:  PCP: Adelene Amas LOV 05/30/23  Cardiologist : none   PPM/ ICD: Device Orders: Rep Notified:  Chest x-ray : 01/17/23- 1 view  EKG : 01/18/23  Echo : Stress test: 06/14/21  Cardiac Cath :   Activity level:  Sleep Study/ CPAP : Fasting Blood Sugar :      / Checks Blood Sugar -- times a day:    Blood Thinner/ Instructions /Last Dose: ASA / Instructions/ Last Dose :   DM- type  type 2 checks glucose twice daily  Hgba1c- 07/11/23- 8.0 routed to DR Kinsinger on 07/13/23.  On no meds per pt at preop   Ozempic- has not started per pt as of preop on 07/11/23.    03/13/23- EGD    Blood pressure was 189/100 in right arm and in left arm was 184/90 at preop.  PT states she is a little nervous.  Denies chest pain, headache, blurred vision or dizziness at prep .  Copy of blood pressure readings given to pt.  PT states she checks at home. Instsructed pt to keep a check on blood pressure and to notfiy PCP of blood pressure readings and readings at preop.  PT voiced understanding.     AT preop at beginning pt stated her friend, Molly Maduro had gone to park the car and was to come in with her.  Looked for friend and could not find.  Called frined and he was in parking lot in car and stated he was coming in to get her afer appt.  Called friend at end of appt to pick her up front at [facility].

## 2023-07-09 NOTE — Patient Instructions (Addendum)
 SURGICAL WAITING ROOM VISITATION  Patients having surgery or a procedure may have no more than 2 support people in the waiting area - these visitors may rotate.    Children under the age of 30 must have an adult with them who is not the patient.  Due to an increase in RSV and influenza rates and associated hospitalizations, children ages 60 and under may not visit patients in [facility] hospitals.  Visitors with respiratory illnesses are discouraged from visiting and should remain at home.  If the patient needs to stay at the hospital during part of their recovery, the visitor guidelines for inpatient rooms apply. Pre-op nurse will coordinate an appropriate time for 1 support person to accompany patient in pre-op.  This support person may not rotate.    Please refer to the [facility] website for the visitor guidelines for Inpatients (after your surgery is over and you are in a regular room).       Your procedure is scheduled on:  07/17/2023    Report to [facility] Main Entrance    Report to admitting at   [phone]   Call this number if you have problems the morning of surgery [phone]   Do not eat food :After Midnight.   After Midnight you may have the following liquids until  0430______ AM DAY OF SURGERY  Water Non-Citrus Juices (without pulp, NO RED-Apple, White grape, White cranberry) Black Coffee (NO MILK/CREAM OR CREAMERS, sugar ok)  Clear Tea (NO MILK/CREAM OR CREAMERS, sugar ok) regular and decaf                             Plain Jell-O (NO RED)                                           Fruit ices (not with fruit pulp, NO RED)                                     Popsicles (NO RED)                                                               Sports drinks like Gatorade (NO RED)                   The day of surgery:  Drink ONE (1) Pre-Surgery Clear Ensure or G2 at 0430 AM ( have completed by )  the morning of surgery. Drink in one sitting. Do not sip.   This drink was given to you during your hospital  pre-op appointment visit. Nothing else to drink after completing the  Pre-Surgery Clear Ensure or G2.          If you have questions, please contact your surgeon's office.       Oral Hygiene is also important to reduce your risk of infection.  Remember - BRUSH YOUR TEETH THE MORNING OF SURGERY WITH YOUR REGULAR TOOTHPASTE  DENTURES WILL BE REMOVED PRIOR TO SURGERY PLEASE DO NOT APPLY "Poly grip" OR ADHESIVES!!!   Do NOT smoke after Midnight   Stop all vitamins and herbal supplements 7 days before surgery.   Take these medicines the morning of surgery with A SIP OF WATER:  inhalers as usual and bring, nebulizer if needed, coreg, flonase, protonix   DO NOT TAKE ANY ORAL DIABETIC MEDICATIONS DAY OF YOUR SURGERY  Bring CPAP mask and tubing day of surgery.                              You may not have any metal on your body including hair pins, jewelry, and body piercing             Do not wear make-up, lotions, powders, perfumes/cologne, or deodorant  Do not wear nail polish including gel and S&S, artificial/acrylic nails, or any other type of covering on natural nails including finger and toenails. If you have artificial nails, gel coating, etc. that needs to be removed by a nail salon please have this removed prior to surgery or surgery may need to be canceled/ delayed if the surgeon/ anesthesia feels like they are unable to be safely monitored.   Do not shave  48 hours prior to surgery.               Men may shave face and neck.   Do not bring valuables to the hospital. [facility] IS NOT             RESPONSIBLE   FOR VALUABLES.   Contacts, glasses, dentures or bridgework may not be worn into surgery.   Bring small overnight bag day of surgery.   DO NOT BRING YOUR HOME MEDICATIONS TO THE HOSPITAL. PHARMACY WILL DISPENSE MEDICATIONS LISTED ON YOUR MEDICATION LIST TO YOU DURING YOUR ADMISSION  IN THE HOSPITAL!    Patients discharged on the day of surgery will not be allowed to drive home.  Someone NEEDS to stay with you for the first 24 hours after anesthesia.   Special Instructions: Bring a copy of your healthcare power of attorney and living will documents the day of surgery if you haven't scanned them before.              Please read over the following fact sheets you were given: IF YOU HAVE QUESTIONS ABOUT YOUR PRE-OP INSTRUCTIONS PLEASE CALL [phone]   If you received a COVID test during your pre-op visit  it is requested that you wear a mask when out in public, stay away from anyone that may not be feeling well and notify your surgeon if you develop symptoms. If you test positive for Covid or have been in contact with anyone that has tested positive in the last 10 days please notify you surgeon.    [facility] - Preparing for Surgery Before surgery, you can play an important role.  Because skin is not sterile, your skin needs to be as free of germs as possible.  You can reduce the number of germs on your skin by washing with CHG (chlorahexidine gluconate) soap before surgery.  CHG is an antiseptic cleaner which kills germs and bonds with the skin to continue killing germs even after washing. Please DO NOT use if you have an allergy to CHG or antibacterial soaps.  If your skin becomes reddened/irritated  stop using the CHG and inform your nurse when you arrive at Short Stay. Do not shave (including legs and underarms) for at least 48 hours prior to the first CHG shower.  You may shave your face/neck. Please follow these instructions carefully:  1.  Shower with CHG Soap the night before surgery and the  morning of Surgery.  2.  If you choose to wash your hair, wash your hair first as usual with your  normal  shampoo.  3.  After you shampoo, rinse your hair and body thoroughly to remove the  shampoo.                           4.  Use CHG as you would any other liquid soap.  You can  apply chg directly  to the skin and wash                       Gently with a scrungie or clean washcloth.  5.  Apply the CHG Soap to your body ONLY FROM THE NECK DOWN.   Do not use on face/ open                           Wound or open sores. Avoid contact with eyes, ears mouth and genitals (private parts).                       Wash face,  Genitals (private parts) with your normal soap.             6.  Wash thoroughly, paying special attention to the area where your surgery  will be performed.  7.  Thoroughly rinse your body with warm water from the neck down.  8.  DO NOT shower/wash with your normal soap after using and rinsing off  the CHG Soap.                9.  Pat yourself dry with a clean towel.            10.  Wear clean pajamas.            11.  Place clean sheets on your bed the night of your first shower and do not  sleep with pets. Day of Surgery : Do not apply any lotions/deodorants the morning of surgery.  Please wear clean clothes to the hospital/surgery center.  FAILURE TO FOLLOW THESE INSTRUCTIONS MAY RESULT IN THE CANCELLATION OF YOUR SURGERY PATIENT SIGNATURE_________________________________  NURSE SIGNATURE__________________________________  ________________________________________________________________________

## 2023-07-11 ENCOUNTER — Encounter (HOSPITAL_COMMUNITY)
Admission: RE | Admit: 2023-07-11 | Discharge: 2023-07-11 | Disposition: A | Discharge status: Home / Self Care | Payer: 59 | Source: Ambulatory Visit | Attending: General Surgery | Admitting: General Surgery

## 2023-07-11 ENCOUNTER — Encounter (HOSPITAL_COMMUNITY): Payer: Self-pay | Admitting: General Surgery

## 2023-07-11 ENCOUNTER — Other Ambulatory Visit: Payer: Self-pay

## 2023-07-11 VITALS — BP 184/90 | HR 70 | Temp 98.4°F | Resp 16 | Ht 64.0 in | Wt 155.0 lb

## 2023-07-11 DIAGNOSIS — F419 Anxiety disorder, unspecified: Secondary | ICD-10-CM | POA: Insufficient documentation

## 2023-07-11 DIAGNOSIS — K219 Gastro-esophageal reflux disease without esophagitis: Secondary | ICD-10-CM | POA: Diagnosis not present

## 2023-07-11 DIAGNOSIS — R1310 Dysphagia, unspecified: Secondary | ICD-10-CM | POA: Insufficient documentation

## 2023-07-11 DIAGNOSIS — Z8673 Personal history of transient ischemic attack (TIA), and cerebral infarction without residual deficits: Secondary | ICD-10-CM | POA: Insufficient documentation

## 2023-07-11 DIAGNOSIS — Z79899 Other long term (current) drug therapy: Secondary | ICD-10-CM | POA: Insufficient documentation

## 2023-07-11 DIAGNOSIS — F32A Depression, unspecified: Secondary | ICD-10-CM | POA: Insufficient documentation

## 2023-07-11 DIAGNOSIS — J45909 Unspecified asthma, uncomplicated: Secondary | ICD-10-CM | POA: Diagnosis not present

## 2023-07-11 DIAGNOSIS — E1142 Type 2 diabetes mellitus with diabetic polyneuropathy: Secondary | ICD-10-CM | POA: Diagnosis not present

## 2023-07-11 DIAGNOSIS — I10 Essential (primary) hypertension: Secondary | ICD-10-CM | POA: Insufficient documentation

## 2023-07-11 DIAGNOSIS — G894 Chronic pain syndrome: Secondary | ICD-10-CM | POA: Diagnosis not present

## 2023-07-11 DIAGNOSIS — Z01812 Encounter for preprocedural laboratory examination: Secondary | ICD-10-CM | POA: Insufficient documentation

## 2023-07-11 DIAGNOSIS — Z01818 Encounter for other preprocedural examination: Secondary | ICD-10-CM | POA: Diagnosis present

## 2023-07-11 DIAGNOSIS — E785 Hyperlipidemia, unspecified: Secondary | ICD-10-CM | POA: Insufficient documentation

## 2023-07-11 DIAGNOSIS — K449 Diaphragmatic hernia without obstruction or gangrene: Secondary | ICD-10-CM | POA: Insufficient documentation

## 2023-07-11 LAB — CBC WITH DIFFERENTIAL/PLATELET
Abs Immature Granulocytes: 0.06 10*3/uL (ref 0.00–0.07)
Basophils Absolute: 0.1 10*3/uL (ref 0.0–0.1)
Basophils Relative: 1 %
Eosinophils Absolute: 0.4 10*3/uL (ref 0.0–0.5)
Eosinophils Relative: 4 %
HCT: 39.2 % (ref 36.0–46.0)
Hemoglobin: 12 g/dL (ref 12.0–15.0)
Immature Granulocytes: 1 %
Lymphocytes Relative: 26 %
Lymphs Abs: 2.5 10*3/uL (ref 0.7–4.0)
MCH: 26.5 pg (ref 26.0–34.0)
MCHC: 30.6 g/dL (ref 30.0–36.0)
MCV: 86.5 fL (ref 80.0–100.0)
Monocytes Absolute: 0.7 10*3/uL (ref 0.1–1.0)
Monocytes Relative: 7 %
Neutro Abs: 5.8 10*3/uL (ref 1.7–7.7)
Neutrophils Relative %: 61 %
Platelets: 291 10*3/uL (ref 150–400)
RBC: 4.53 MIL/uL (ref 3.87–5.11)
RDW: 14.8 % (ref 11.5–15.5)
WBC: 9.5 10*3/uL (ref 4.0–10.5)
nRBC: 0 % (ref 0.0–0.2)

## 2023-07-11 LAB — BASIC METABOLIC PANEL
Anion gap: 10 (ref 5–15)
BUN: 17 mg/dL (ref 8–23)
CO2: 24 mmol/L (ref 22–32)
Calcium: 8.6 mg/dL — ABNORMAL LOW (ref 8.9–10.3)
Chloride: 102 mmol/L (ref 98–111)
Creatinine, Ser: 1.39 mg/dL — ABNORMAL HIGH (ref 0.44–1.00)
GFR, Estimated: 40 mL/min — ABNORMAL LOW (ref >60)
Glucose, Bld: 138 mg/dL — ABNORMAL HIGH (ref 70–99)
Potassium: 4.3 mmol/L (ref 3.5–5.1)
Sodium: 136 mmol/L (ref 135–145)

## 2023-07-11 LAB — GLUCOSE, CAPILLARY: Glucose-Capillary: 132 mg/dL — ABNORMAL HIGH (ref 70–99)

## 2023-07-11 LAB — HEMOGLOBIN A1C
Hgb A1c MFr Bld: 8 % — ABNORMAL HIGH (ref 4.8–5.6)
Mean Plasma Glucose: 182.9 mg/dL

## 2023-07-13 ENCOUNTER — Encounter (HOSPITAL_COMMUNITY): Payer: Self-pay

## 2023-07-13 NOTE — Progress Notes (Addendum)
 Case: [id number] Date/Time: 07/17/23 0715   Procedures:      REPAIR, HERNIA, HIATAL, ROBOT-ASSISTED     INSERTION, GASTROSTOMY TUBE, PERCUTANEOUS   Anesthesia type: General   Pre-op diagnosis: HIATAL HERNIA   Location: [facility]OM 02 / [facility]   Surgeons: Kinsinger, De Blanch, MD       DISCUSSION: Dana Romero is a 73 yo female who presents to PAT prior to surgery above. PMH of HTN, asthma, multiple CVAs, GERD, hiatal hernia, T2DM, chronic pain syndrome, anxiety, depression  Prior anesthesia complications include PONV  Patient with severe dysphagia, GERD, and multiple EGDs with dilatations. Now scheduled for hiatal hernia repair due to failure of medical therapy.  Follows with PCP for chronic issues. Last seen on 05/30/23. Patient with hx of multiple CVAs. Last one was in 2023. She is on ASA therapy, could not tolerate Plavix. Diabetes not well controlled - A1c 8.0. BP very elevated at PAT visit and PCP visit. Patient reports home readings are high as well and her PCP is looking in to reasons for secondary HTN. Usually her BP runs 160/90. Asthma treated with prn albuterol. She was admitted for COVID pneumonia in 01/2023.  Seen by Cardiology in the past for HTN and hyperlipidemia. Last seen in 2023. She is statin intolerant. Takes Repatha.     07/11/2023    2:44 PM 05/17/2023    2:32 PM 03/13/2023   10:00 AM  Vitals with BMI  Height 5\' 4"  5\' 4"    Weight 155 lbs 152 lbs 13 oz   BMI 26.59 26.22   Systolic 184 136 454  Diastolic 90 77 81  Pulse 70 69      VS: BP (!) 184/90   Pulse 70   Temp 36.9 C (Oral)   Resp 16   Ht 5\' 4"  (1.626 m)   Wt 70.3 kg   LMP 03/15/2014   SpO2 100%   BMI 26.61 kg/m   PROVIDERS: Lazoff, Shawn P, DO   LABS: Labs reviewed: Acceptable for surgery. CKD at baseline. (all labs ordered are listed, but only abnormal results are displayed)  Labs Reviewed  BASIC METABOLIC PANEL - Abnormal; Notable for the following components:      Result Value   Glucose,  Bld 138 (*)    Creatinine, Ser 1.39 (*)    Calcium 8.6 (*)    GFR, Estimated 40 (*)    All other components within normal limits  HEMOGLOBIN A1C - Abnormal; Notable for the following components:   Hgb A1c MFr Bld 8.0 (*)    All other components within normal limits  GLUCOSE, CAPILLARY - Abnormal; Notable for the following components:   Glucose-Capillary 132 (*)    All other components within normal limits  CBC WITH DIFFERENTIAL/PLATELET     IMAGES:  CXR 01/17/23:   FINDINGS: No consolidation, pneumothorax or effusion. No edema. Normal cardiopericardial silhouette. Overlapping cardiac leads. Lucency overlying the cardiac silhouette consistent with a hiatal hernia.   IMPRESSION: Hiatal hernia.  No acute cardiopulmonary disease  EKG:   CV: Stress test 06/14/2021:  Narrative & Impression     Findings are consistent with no prior ischemia. The study is low risk.   No ST deviation was noted.   Left ventricular function is normal. End diastolic cavity size is normal. End systolic cavity size is normal.   Prior study available for comparison from 01/30/2011. No changes compared to prior study.  Cardiac monitor 05/12/2019:  Zio patch reviewed.  12 days 5 hours analyzed.  Predominant rhythm is sinus with heart rate ranging from 63 bpm up to 114 bpm and average heart rate 81 bpm.  Rare PACs and PVCs noted representing less than 1% of total beats.  There were a few, generally brief episodes of SVT, one aberrantly conducted episode of 4 beats, longest SVT episode lasted 13 seconds.  Heart rate up into the 190s during brief SVT.  A few isolated complexes look to be possible preexcitation.  There were no sustained arrhythmias or pauses.  Past Medical History:  Diagnosis Date   Anxiety    Asthma    Caregiver stress 07/04/2011   Chronic pain syndrome    Degenerative disc disease, cervical    Depression    Dyspnea    due to hiatal hernia   Essential hypertension 1998   GERD  (gastroesophageal reflux disease)    Gout    Hiatal hernia    History of pancreatitis 06/08/2005   History of stroke    September 2020   N&V (nausea and vomiting) 04/13/2021   Pelvic mass    Pneumonia    x 2   PONV (postoperative nausea and vomiting)    Renal insufficiency    Seasonal allergies    Sinusitis, chronic    Stroke (HCC)    Transfusion history    Type 2 diabetes mellitus (HCC) 1998    Past Surgical History:  Procedure Laterality Date   APPENDECTOMY     ARM DEBRIDEMENT Left    debridement of axilla "spider bite"   BALLOON DILATION N/A 06/30/2021   Procedure: BALLOON DILATION;  Surgeon: Lanelle Bal, DO;  [facility];  Service: Endoscopy;  Laterality: N/A;   BALLOON DILATION N/A 01/18/2022   Procedure: BALLOON DILATION;  Surgeon: Lanelle Bal, DO;  [facility];  Service: Endoscopy;  Laterality: N/A;   BALLOON DILATION N/A 11/13/2022   Procedure: BALLOON DILATION;  Surgeon: Lanelle Bal, DO;  [facility];  Service: Endoscopy;  Laterality: N/A;   BIOPSY  06/30/2021   Procedure: BIOPSY;  Surgeon: Lanelle Bal, DO;  [facility];  Service: Endoscopy;;   BIOPSY  01/18/2022   Procedure: BIOPSY;  Surgeon: Lanelle Bal, DO;  [facility];  Service: Endoscopy;;   BIOPSY  11/13/2022   Procedure: BIOPSY;  Surgeon: Lanelle Bal, DO;  [facility];  Service: Endoscopy;;   BREAST SURGERY     breast cyst x2 -last '12   CATARACT EXTRACTION W/PHACO Left 11/25/2021   Procedure: CATARACT EXTRACTION PHACO AND INTRAOCULAR LENS PLACEMENT (IOC);  Surgeon: Fabio Pierce, MD;  [facility]: [facility];  Service: Ophthalmology;  Laterality: Left;  CDE 4.11   CATARACT EXTRACTION W/PHACO Right 01/06/2022   Procedure: CATARACT EXTRACTION PHACO AND INTRAOCULAR LENS PLACEMENT (IOC);  Surgeon: Fabio Pierce, MD;  [facility];  Service: Ophthalmology;  Laterality: Right;  CDE 4.97   CHOLECYSTECTOMY N/A  08/11/2015   Procedure: LAPAROSCOPIC CHOLECYSTECTOMY;  Surgeon: De Blanch Kinsinger, MD;  [facility];  Service: General;  Laterality: N/A;   COLONOSCOPY WITH PROPOFOL N/A 11/13/2022   Procedure: COLONOSCOPY WITH PROPOFOL;  Surgeon: Lanelle Bal, DO;  [facility];  Service: Endoscopy;  Laterality: N/A;  8:30 am, asa 3   ESOPHAGOGASTRODUODENOSCOPY (EGD) WITH PROPOFOL N/A 06/30/2021   Surgeon: Lanelle Bal, DO;   Moderate size hiatal hernia, moderate Schatzki's ring dilated, gastritis biopsied, normal examined duodenum.  Pathology with gastric antral and oxyntic mucosa with nonspecific reactive  gastropathy, negative for H. pylori.   ESOPHAGOGASTRODUODENOSCOPY (EGD) WITH PROPOFOL N/A 01/18/2022   Procedure: ESOPHAGOGASTRODUODENOSCOPY (EGD) WITH PROPOFOL;  Surgeon: Lanelle Bal, DO;  [facility];  Service: Endoscopy;  Laterality: N/A;  1:15 pm   ESOPHAGOGASTRODUODENOSCOPY (EGD) WITH PROPOFOL N/A 11/13/2022   Procedure: ESOPHAGOGASTRODUODENOSCOPY (EGD) WITH PROPOFOL;  Surgeon: Lanelle Bal, DO;  [facility];  Service: Endoscopy;  Laterality: N/A;   ESOPHAGOGASTRODUODENOSCOPY (EGD) WITH PROPOFOL N/A 03/13/2023   Procedure: ESOPHAGOGASTRODUODENOSCOPY (EGD) WITH PROPOFOL;  Surgeon: Dolores Frame, MD;  [facility];  Service: Gastroenterology;  Laterality: N/A;  945am, asa 3   POLYPECTOMY  11/13/2022   Procedure: POLYPECTOMY;  Surgeon: Lanelle Bal, DO;  [facility];  Service: Endoscopy;;   SAVORY DILATION N/A 03/13/2023   Procedure: SAVORY DILATION;  Surgeon: Dolores Frame, MD;  [facility];  Service: Gastroenterology;  Laterality: N/A;   TONSILLECTOMY     TONSILLECTOMY AND ADENOIDECTOMY     TUBAL LIGATION     post partum    MEDICATIONS:  diphenhydrAMINE (BENADRYL) 50 MG capsule   OVER THE COUNTER MEDICATION   ADVAIR HFA 115-21 MCG/ACT inhaler   albuterol (PROVENTIL) (2.5 MG/3ML)  0.083% nebulizer solution   albuterol (VENTOLIN HFA) 108 (90 Base) MCG/ACT inhaler   Alcohol Swabs PADS   ALPRAZolam (XANAX) 1 MG tablet   ARIPiprazole (ABILIFY) 15 MG tablet   Ascorbic Acid (VITAMIN C PO)   blood glucose meter kit and supplies KIT   blood glucose meter kit and supplies   Blood Glucose Monitoring Suppl (FREESTYLE LITE) DEVI   carvedilol (COREG CR) 40 MG 24 hr capsule   denosumab (PROLIA) 60 MG/ML SOSY injection   EPINEPHrine (EPIPEN 2-PAK) 0.3 mg/0.3 mL IJ SOAJ injection   estradiol (VIVELLE-DOT) 0.05 MG/24HR patch   FLUoxetine (PROZAC) 40 MG capsule   fluticasone (FLONASE) 50 MCG/ACT nasal spray   glucose blood test strip   glucose monitoring kit (FREESTYLE) monitoring kit   Lancet Devices (LANCING DEVICE) MISC   Lancets MISC   magnesium oxide (MAG-OX) 400 MG tablet   meclizine (ANTIVERT) 25 MG tablet   medroxyPROGESTERone (PROVERA) 2.5 MG tablet   Menthol, Topical Analgesic, (BIOFREEZE) 4 % GEL   Multiple Vitamin (MULTIVITAMIN WITH MINERALS) TABS tablet   olmesartan (BENICAR) 40 MG tablet   ondansetron (ZOFRAN) 4 MG tablet   ondansetron (ZOFRAN-ODT) 4 MG disintegrating tablet   oxyCODONE-acetaminophen (PERCOCET) 10-325 MG tablet   pantoprazole (PROTONIX) 40 MG tablet   Propylene Glycol (SYSTANE BALANCE) 0.6 % SOLN   REPATHA SURECLICK 140 MG/ML SOAJ   Semaglutide (OZEMPIC, 0.25 OR 0.5 MG/DOSE, Amo)   VITAMIN D PO   No current facility-administered medications for this encounter.   Marcille Blanco [facility] Short Stay/Anesthesiology [facility] Phone [phone] 07/13/2023 10:44 AM

## 2023-07-13 NOTE — Anesthesia Preprocedure Evaluation (Addendum)
 Anesthesia Evaluation  Patient identified by MRN, date of birth, ID band Patient awake    Reviewed: Allergy & Precautions, NPO status , Patient's Chart, lab work & pertinent test results, reviewed documented beta blocker date and time   History of Anesthesia Complications (+) PONV and history of anesthetic complications  Airway Mallampati: II  TM Distance: >3 FB Neck ROM: Full    Dental  (+) Lower Dentures, Upper Dentures   Pulmonary asthma    Pulmonary exam normal        Cardiovascular hypertension, Pt. on home beta blockers and Pt. on medications Normal cardiovascular exam   '23 Nuc Stress - Findings are consistent with no prior ischemia. The study is low risk.   No ST deviation was noted.   Left ventricular function is normal. End diastolic cavity size is normal. End systolic cavity size is normal.   Prior study available for comparison from 01/30/2011. No changes compared to prior study.    Neuro/Psych  PSYCHIATRIC DISORDERS Anxiety Depression    CVA, No Residual Symptoms    GI/Hepatic Neg liver ROS, hiatal hernia,GERD  Controlled,,  Endo/Other  diabetes, Poorly Controlled, Type 2    Renal/GU CRFRenal disease     Musculoskeletal  (+) Arthritis ,    Abdominal   Peds  Hematology negative hematology ROS (+)   Anesthesia Other Findings Off GLP-1a Chronic pain syndrome Gout  Reproductive/Obstetrics                             Anesthesia Physical Anesthesia Plan  ASA: 3  Anesthesia Plan: General   Post-op Pain Management: Tylenol PO (pre-op)*   Induction: Intravenous  PONV Risk Score and Plan: 4 or greater and Treatment may vary due to age or medical condition, Ondansetron, Dexamethasone and Propofol infusion  Airway Management Planned: Oral ETT  Additional Equipment: None  Intra-op Plan:   Post-operative Plan: Extubation in OR  Informed Consent: I have reviewed the  patients History and Physical, chart, labs and discussed the procedure including the risks, benefits and alternatives for the proposed anesthesia with the patient or authorized representative who has indicated his/her understanding and acceptance.     Dental advisory given  Plan Discussed with: CRNA and Anesthesiologist  Anesthesia Plan Comments: (See PAT note from 3/5 by K Gekas PA-C )        Anesthesia Quick Evaluation

## 2023-07-17 ENCOUNTER — Other Ambulatory Visit: Payer: Self-pay

## 2023-07-17 ENCOUNTER — Encounter (HOSPITAL_COMMUNITY): Payer: Self-pay | Admitting: General Surgery

## 2023-07-17 ENCOUNTER — Observation Stay (HOSPITAL_COMMUNITY)
Admission: RE | Admit: 2023-07-17 | Discharge: 2023-07-18 | Disposition: A | Discharge status: Home / Self Care | Payer: 59 | Source: Ambulatory Visit | Attending: General Surgery | Admitting: General Surgery

## 2023-07-17 ENCOUNTER — Encounter (HOSPITAL_COMMUNITY)
Admission: RE | Disposition: A | Discharge status: Home / Self Care | Payer: Self-pay | Source: Ambulatory Visit | Attending: General Surgery

## 2023-07-17 ENCOUNTER — Ambulatory Visit (HOSPITAL_COMMUNITY): Payer: Self-pay | Admitting: Physician Assistant

## 2023-07-17 ENCOUNTER — Ambulatory Visit (HOSPITAL_BASED_OUTPATIENT_CLINIC_OR_DEPARTMENT_OTHER): Admitting: Anesthesiology

## 2023-07-17 DIAGNOSIS — Z01818 Encounter for other preprocedural examination: Secondary | ICD-10-CM

## 2023-07-17 DIAGNOSIS — I120 Hypertensive chronic kidney disease with stage 5 chronic kidney disease or end stage renal disease: Secondary | ICD-10-CM

## 2023-07-17 DIAGNOSIS — Z79899 Other long term (current) drug therapy: Secondary | ICD-10-CM | POA: Diagnosis not present

## 2023-07-17 DIAGNOSIS — E1122 Type 2 diabetes mellitus with diabetic chronic kidney disease: Secondary | ICD-10-CM

## 2023-07-17 DIAGNOSIS — N186 End stage renal disease: Secondary | ICD-10-CM | POA: Diagnosis not present

## 2023-07-17 DIAGNOSIS — K449 Diaphragmatic hernia without obstruction or gangrene: Secondary | ICD-10-CM

## 2023-07-17 DIAGNOSIS — E119 Type 2 diabetes mellitus without complications: Secondary | ICD-10-CM | POA: Diagnosis not present

## 2023-07-17 HISTORY — DX: Other specified postprocedural states: Z98.890

## 2023-07-17 HISTORY — DX: Dyspnea, unspecified: R06.00

## 2023-07-17 HISTORY — PX: XI ROBOTIC ASSISTED HIATAL HERNIA REPAIR: SHX6889

## 2023-07-17 HISTORY — DX: Other specified postprocedural states: R11.2

## 2023-07-17 HISTORY — DX: Pneumonia, unspecified organism: J18.9

## 2023-07-17 HISTORY — PX: ESOPHAGOGASTRODUODENOSCOPY: SHX5428

## 2023-07-17 HISTORY — PX: LAPAROSCOPIC INSERTION GASTROSTOMY TUBE: SHX6817

## 2023-07-17 LAB — CREATININE, SERUM
Creatinine, Ser: 1.2 mg/dL — ABNORMAL HIGH (ref 0.44–1.00)
GFR, Estimated: 48 mL/min — ABNORMAL LOW (ref >60)

## 2023-07-17 LAB — GLUCOSE, CAPILLARY
Glucose-Capillary: 182 mg/dL — ABNORMAL HIGH (ref 70–99)
Glucose-Capillary: 189 mg/dL — ABNORMAL HIGH (ref 70–99)
Glucose-Capillary: 138 mg/dL — ABNORMAL HIGH (ref 70–99)
Glucose-Capillary: 167 mg/dL — ABNORMAL HIGH (ref 70–99)
Glucose-Capillary: 175 mg/dL — ABNORMAL HIGH (ref 70–99)

## 2023-07-17 LAB — CBC
HCT: 38.4 % (ref 36.0–46.0)
Hemoglobin: 11.7 g/dL — ABNORMAL LOW (ref 12.0–15.0)
MCH: 26.2 pg (ref 26.0–34.0)
MCHC: 30.5 g/dL (ref 30.0–36.0)
MCV: 85.9 fL (ref 80.0–100.0)
Platelets: 260 10*3/uL (ref 150–400)
RBC: 4.47 MIL/uL (ref 3.87–5.11)
RDW: 14.7 % (ref 11.5–15.5)
WBC: 13.7 10*3/uL — ABNORMAL HIGH (ref 4.0–10.5)
nRBC: 0 % (ref 0.0–0.2)

## 2023-07-17 SURGERY — REPAIR, HERNIA, HIATAL, ROBOT-ASSISTED
Anesthesia: General

## 2023-07-17 MED ORDER — CHLORHEXIDINE GLUCONATE 0.12 % MT SOLN
15.0000 mL | Freq: Once | OROMUCOSAL | Status: AC
  Administered 2023-07-17: 15 mL via OROMUCOSAL

## 2023-07-17 MED ORDER — LIDOCAINE HCL 2 % IJ SOLN
INTRAMUSCULAR | Status: AC
  Filled 2023-07-17: qty 20

## 2023-07-17 MED ORDER — BUPIVACAINE-EPINEPHRINE (PF) 0.25% -1:200000 IJ SOLN
INTRAMUSCULAR | Status: AC
  Filled 2023-07-17: qty 60

## 2023-07-17 MED ORDER — ENOXAPARIN SODIUM 40 MG/0.4ML IJ SOSY: 40.0000 mg | PREFILLED_SYRINGE | INTRAMUSCULAR | Status: DC

## 2023-07-17 MED ORDER — HYDROMORPHONE HCL 1 MG/ML IJ SOLN: 0.5000 mg | INTRAMUSCULAR | Status: DC | PRN

## 2023-07-17 MED ORDER — ORAL CARE MOUTH RINSE: 15.0000 mL | Freq: Once | OROMUCOSAL | Status: AC

## 2023-07-17 MED ORDER — ACETAMINOPHEN 500 MG PO TABS
1000.0000 mg | ORAL_TABLET | ORAL | Status: AC
  Administered 2023-07-17: 1000 mg via ORAL
  Filled 2023-07-17: qty 2

## 2023-07-17 MED ORDER — BUPIVACAINE-EPINEPHRINE 0.25% -1:200000 IJ SOLN
INTRAMUSCULAR | Status: DC | PRN
  Administered 2023-07-17: 30 mL

## 2023-07-17 MED ORDER — FLUOXETINE HCL 20 MG PO CAPS
80.0000 mg | ORAL_CAPSULE | Freq: Every evening | ORAL | Status: DC
  Administered 2023-07-17: 80 mg via ORAL
  Filled 2023-07-17: qty 4

## 2023-07-17 MED ORDER — PHENYLEPHRINE 80 MCG/ML (10ML) SYRINGE FOR IV PUSH (FOR BLOOD PRESSURE SUPPORT)
PREFILLED_SYRINGE | INTRAVENOUS | Status: DC | PRN
  Administered 2023-07-17: 80 ug via INTRAVENOUS
  Administered 2023-07-17: 40 ug via INTRAVENOUS

## 2023-07-17 MED ORDER — SUGAMMADEX SODIUM 200 MG/2ML IV SOLN
INTRAVENOUS | Status: DC | PRN
  Administered 2023-07-17: 200 mg via INTRAVENOUS

## 2023-07-17 MED ORDER — ACETAMINOPHEN 500 MG PO TABS
1000.0000 mg | ORAL_TABLET | Freq: Four times a day (QID) | ORAL | Status: DC
  Administered 2023-07-17 – 2023-07-18 (×2): 1000 mg via ORAL
  Filled 2023-07-17 (×3): qty 2

## 2023-07-17 MED ORDER — KETAMINE HCL 50 MG/5ML IJ SOSY
PREFILLED_SYRINGE | INTRAMUSCULAR | Status: AC
  Filled 2023-07-17: qty 5

## 2023-07-17 MED ORDER — FENTANYL CITRATE PF 50 MCG/ML IJ SOSY
PREFILLED_SYRINGE | INTRAMUSCULAR | Status: AC
  Filled 2023-07-17: qty 1

## 2023-07-17 MED ORDER — DEXAMETHASONE SODIUM PHOSPHATE 10 MG/ML IJ SOLN
INTRAMUSCULAR | Status: DC | PRN
  Administered 2023-07-17: 5 mg via INTRAVENOUS

## 2023-07-17 MED ORDER — ALBUTEROL SULFATE (2.5 MG/3ML) 0.083% IN NEBU: 2.5000 mg | INHALATION_SOLUTION | RESPIRATORY_TRACT | Status: DC | PRN

## 2023-07-17 MED ORDER — DEXTROSE-SODIUM CHLORIDE 5-0.45 % IV SOLN: INTRAVENOUS | Status: DC

## 2023-07-17 MED ORDER — ONDANSETRON HCL 4 MG/2ML IJ SOLN
INTRAMUSCULAR | Status: DC | PRN
  Administered 2023-07-17: 4 mg via INTRAVENOUS

## 2023-07-17 MED ORDER — SUGAMMADEX SODIUM 200 MG/2ML IV SOLN
INTRAVENOUS | Status: AC
  Filled 2023-07-17: qty 2

## 2023-07-17 MED ORDER — INSULIN ASPART 100 UNIT/ML IJ SOLN
0.0000 [IU] | INTRAMUSCULAR | Status: AC | PRN
  Administered 2023-07-17 (×2): 2 [IU] via SUBCUTANEOUS
  Filled 2023-07-17: qty 1

## 2023-07-17 MED ORDER — DIPHENHYDRAMINE HCL 50 MG/ML IJ SOLN: 12.5000 mg | Freq: Four times a day (QID) | INTRAMUSCULAR | Status: DC | PRN

## 2023-07-17 MED ORDER — ESMOLOL HCL 100 MG/10ML IV SOLN
INTRAVENOUS | Status: DC | PRN
Start: 2023-07-17 — End: 2023-07-17
  Administered 2023-07-17: 20 mg via INTRAVENOUS
  Administered 2023-07-17: 30 mg via INTRAVENOUS

## 2023-07-17 MED ORDER — CHLORHEXIDINE GLUCONATE CLOTH 2 % EX PADS: 6.0000 | MEDICATED_PAD | Freq: Once | CUTANEOUS | Status: DC

## 2023-07-17 MED ORDER — LIDOCAINE HCL (PF) 2 % IJ SOLN
INTRAMUSCULAR | Status: AC
  Filled 2023-07-17: qty 5

## 2023-07-17 MED ORDER — FENTANYL CITRATE (PF) 250 MCG/5ML IJ SOLN
INTRAMUSCULAR | Status: AC
  Filled 2023-07-17: qty 5

## 2023-07-17 MED ORDER — MECLIZINE HCL 25 MG PO TABS: 25.0000 mg | ORAL_TABLET | Freq: Three times a day (TID) | ORAL | Status: DC | PRN

## 2023-07-17 MED ORDER — LACTATED RINGERS IV SOLN: INTRAVENOUS | Status: DC

## 2023-07-17 MED ORDER — KETAMINE HCL 10 MG/ML IJ SOLN
INTRAMUSCULAR | Status: DC | PRN
  Administered 2023-07-17: 30 mg via INTRAVENOUS

## 2023-07-17 MED ORDER — SIMETHICONE 80 MG PO CHEW: 40.0000 mg | CHEWABLE_TABLET | Freq: Four times a day (QID) | ORAL | Status: DC | PRN

## 2023-07-17 MED ORDER — STERILE WATER FOR IRRIGATION IR SOLN
Status: DC | PRN
  Administered 2023-07-17: 1000 mL

## 2023-07-17 MED ORDER — ONDANSETRON 4 MG PO TBDP: 4.0000 mg | ORAL_TABLET | Freq: Four times a day (QID) | ORAL | Status: DC | PRN

## 2023-07-17 MED ORDER — ALPRAZOLAM 0.5 MG PO TABS
0.5000 mg | ORAL_TABLET | Freq: Every evening | ORAL | Status: DC | PRN
  Administered 2023-07-17: 1 mg via ORAL
  Filled 2023-07-17: qty 2

## 2023-07-17 MED ORDER — PHENYLEPHRINE HCL-NACL 20-0.9 MG/250ML-% IV SOLN
INTRAVENOUS | Status: DC | PRN
  Administered 2023-07-17: 35 ug/min via INTRAVENOUS

## 2023-07-17 MED ORDER — HYDROMORPHONE HCL 1 MG/ML IJ SOLN
INTRAMUSCULAR | Status: AC
Start: 2023-07-17 — End: 2023-07-17
  Filled 2023-07-17: qty 1

## 2023-07-17 MED ORDER — ONDANSETRON HCL 4 MG/2ML IJ SOLN: 4.0000 mg | Freq: Once | INTRAMUSCULAR | Status: DC | PRN

## 2023-07-17 MED ORDER — HYDROMORPHONE HCL 1 MG/ML IJ SOLN
0.2500 mg | INTRAMUSCULAR | Status: DC | PRN
  Administered 2023-07-17: 0.5 mg via INTRAVENOUS
  Administered 2023-07-17 (×2): 0.25 mg via INTRAVENOUS

## 2023-07-17 MED ORDER — ROCURONIUM BROMIDE 10 MG/ML (PF) SYRINGE
PREFILLED_SYRINGE | INTRAVENOUS | Status: DC | PRN
  Administered 2023-07-17: 50 mg via INTRAVENOUS
  Administered 2023-07-17: 20 mg via INTRAVENOUS
  Administered 2023-07-17: 10 mg via INTRAVENOUS

## 2023-07-17 MED ORDER — FENTANYL CITRATE PF 50 MCG/ML IJ SOSY
25.0000 ug | PREFILLED_SYRINGE | INTRAMUSCULAR | Status: DC | PRN
  Administered 2023-07-17 (×3): 50 ug via INTRAVENOUS

## 2023-07-17 MED ORDER — PHENYLEPHRINE 80 MCG/ML (10ML) SYRINGE FOR IV PUSH (FOR BLOOD PRESSURE SUPPORT)
PREFILLED_SYRINGE | INTRAVENOUS | Status: AC
  Filled 2023-07-17: qty 10

## 2023-07-17 MED ORDER — HYDRALAZINE HCL 20 MG/ML IJ SOLN
10.0000 mg | INTRAMUSCULAR | Status: DC | PRN
  Administered 2023-07-17: 10 mg via INTRAVENOUS
  Filled 2023-07-17: qty 1

## 2023-07-17 MED ORDER — ONDANSETRON HCL 4 MG/2ML IJ SOLN
INTRAMUSCULAR | Status: AC
  Filled 2023-07-17: qty 2

## 2023-07-17 MED ORDER — DEXAMETHASONE SODIUM PHOSPHATE 10 MG/ML IJ SOLN
INTRAMUSCULAR | Status: AC
  Filled 2023-07-17: qty 1

## 2023-07-17 MED ORDER — CARVEDILOL PHOSPHATE ER 20 MG PO CP24
40.0000 mg | ORAL_CAPSULE | Freq: Every day | ORAL | Status: DC
  Administered 2023-07-18: 40 mg via ORAL
  Filled 2023-07-17: qty 2

## 2023-07-17 MED ORDER — MAGNESIUM OXIDE -MG SUPPLEMENT 400 (240 MG) MG PO TABS
400.0000 mg | ORAL_TABLET | Freq: Every morning | ORAL | Status: DC
  Administered 2023-07-18: 400 mg via ORAL
  Filled 2023-07-17: qty 1

## 2023-07-17 MED ORDER — FLUTICASONE PROPIONATE 50 MCG/ACT NA SUSP
2.0000 | Freq: Every day | NASAL | Status: DC
  Administered 2023-07-18: 2 via NASAL
  Filled 2023-07-17: qty 16

## 2023-07-17 MED ORDER — OXYCODONE HCL 5 MG/5ML PO SOLN: 5.0000 mg | Freq: Once | ORAL | Status: DC | PRN

## 2023-07-17 MED ORDER — FENTANYL CITRATE (PF) 250 MCG/5ML IJ SOLN
INTRAMUSCULAR | Status: DC | PRN
Start: 2023-07-17 — End: 2023-07-17
  Administered 2023-07-17 (×5): 50 ug via INTRAVENOUS

## 2023-07-17 MED ORDER — LIDOCAINE HCL (PF) 2 % IJ SOLN
INTRAMUSCULAR | Status: DC | PRN
  Administered 2023-07-17: 1.5 mg/kg/h via INTRADERMAL
  Administered 2023-07-17: 60 mg via INTRADERMAL

## 2023-07-17 MED ORDER — ARIPIPRAZOLE 5 MG PO TABS
15.0000 mg | ORAL_TABLET | Freq: Every day | ORAL | Status: DC
  Administered 2023-07-17: 15 mg via ORAL
  Filled 2023-07-17: qty 1

## 2023-07-17 MED ORDER — ROCURONIUM BROMIDE 10 MG/ML (PF) SYRINGE
PREFILLED_SYRINGE | INTRAVENOUS | Status: AC
  Filled 2023-07-17: qty 10

## 2023-07-17 MED ORDER — PROPOFOL 10 MG/ML IV BOLUS
INTRAVENOUS | Status: AC
  Filled 2023-07-17: qty 20

## 2023-07-17 MED ORDER — CEFAZOLIN SODIUM-DEXTROSE 2-4 GM/100ML-% IV SOLN
2.0000 g | INTRAVENOUS | Status: AC
  Administered 2023-07-17: 2 g via INTRAVENOUS
  Filled 2023-07-17: qty 100

## 2023-07-17 MED ORDER — ESMOLOL HCL 100 MG/10ML IV SOLN
INTRAVENOUS | Status: AC
  Filled 2023-07-17: qty 10

## 2023-07-17 MED ORDER — LABETALOL HCL 5 MG/ML IV SOLN: 5.0000 mg | INTRAVENOUS | Status: DC | PRN

## 2023-07-17 MED ORDER — PROPOFOL 10 MG/ML IV BOLUS
INTRAVENOUS | Status: DC | PRN
  Administered 2023-07-17: 120 mg via INTRAVENOUS

## 2023-07-17 MED ORDER — FENTANYL CITRATE PF 50 MCG/ML IJ SOSY
PREFILLED_SYRINGE | INTRAMUSCULAR | Status: AC
  Filled 2023-07-17: qty 2

## 2023-07-17 MED ORDER — DIPHENHYDRAMINE HCL 12.5 MG/5ML PO ELIX: 12.5000 mg | ORAL_SOLUTION | Freq: Four times a day (QID) | ORAL | Status: DC | PRN

## 2023-07-17 MED ORDER — PANTOPRAZOLE SODIUM 40 MG IV SOLR
40.0000 mg | Freq: Every day | INTRAVENOUS | Status: DC
  Administered 2023-07-17: 40 mg via INTRAVENOUS
  Filled 2023-07-17: qty 10

## 2023-07-17 MED ORDER — MOMETASONE FURO-FORMOTEROL FUM 200-5 MCG/ACT IN AERO
2.0000 | INHALATION_SPRAY | Freq: Two times a day (BID) | RESPIRATORY_TRACT | Status: DC
  Administered 2023-07-17 – 2023-07-18 (×2): 2 via RESPIRATORY_TRACT
  Filled 2023-07-17: qty 8.8

## 2023-07-17 MED ORDER — 0.9 % SODIUM CHLORIDE (POUR BTL) OPTIME
TOPICAL | Status: DC | PRN
Start: 2023-07-17 — End: 2023-07-17
  Administered 2023-07-17: 1000 mL

## 2023-07-17 MED ORDER — OXYCODONE HCL 5 MG PO TABS: 5.0000 mg | ORAL_TABLET | Freq: Once | ORAL | Status: DC | PRN

## 2023-07-17 MED ORDER — IRBESARTAN 300 MG PO TABS
300.0000 mg | ORAL_TABLET | Freq: Every day | ORAL | Status: DC
  Administered 2023-07-18: 300 mg via ORAL
  Filled 2023-07-17: qty 1
  Filled 2023-07-17: qty 2

## 2023-07-17 MED ORDER — OXYCODONE HCL 5 MG PO TABS
5.0000 mg | ORAL_TABLET | ORAL | Status: DC | PRN
  Administered 2023-07-17 – 2023-07-18 (×2): 10 mg via ORAL
  Filled 2023-07-17 (×2): qty 2

## 2023-07-17 MED ORDER — ONDANSETRON HCL 4 MG/2ML IJ SOLN: 4.0000 mg | Freq: Four times a day (QID) | INTRAMUSCULAR | Status: DC | PRN

## 2023-07-17 MED ORDER — TRAMADOL HCL 50 MG PO TABS
50.0000 mg | ORAL_TABLET | Freq: Four times a day (QID) | ORAL | Status: DC | PRN
  Administered 2023-07-18: 50 mg via ORAL
  Filled 2023-07-17 (×2): qty 1

## 2023-07-17 SURGICAL SUPPLY — 72 items
ANTIFOG SOL W/FOAM PAD STRL (MISCELLANEOUS) ×1 IMPLANT
BAG COUNTER SPONGE SURGICOUNT (BAG) ×1 IMPLANT
BENZOIN TINCTURE PRP APPL 2/3 (GAUZE/BANDAGES/DRESSINGS) IMPLANT
BLADE SURG SZ11 CARB STEEL (BLADE) ×1 IMPLANT
BNDG ADH 1X3 SHEER STRL LF (GAUZE/BANDAGES/DRESSINGS) ×2 IMPLANT
CABLE HIGH FREQUENCY MONO STRZ (ELECTRODE) IMPLANT
CHLORAPREP W/TINT 26 (MISCELLANEOUS) ×1 IMPLANT
COVER SURGICAL LIGHT HANDLE (MISCELLANEOUS) ×1 IMPLANT
COVER TIP SHEARS 8 DVNC (MISCELLANEOUS) IMPLANT
DERMABOND ADVANCED .7 DNX12 (GAUZE/BANDAGES/DRESSINGS) ×1 IMPLANT
DRAIN PENROSE 0.5X18 (DRAIN) IMPLANT
DRAPE ARM DVNC X/XI (DISPOSABLE) ×4 IMPLANT
DRAPE COLUMN DVNC XI (DISPOSABLE) ×1 IMPLANT
DRAPE SURG ORHT 6 SPLT 77X108 (DRAPES) ×1 IMPLANT
DRIVER NDL LRG 8 DVNC XI (INSTRUMENTS) ×1 IMPLANT
DRIVER NDL MEGA SUTCUT DVNCXI (INSTRUMENTS) ×1 IMPLANT
DRIVER NDLE LRG 8 DVNC XI (INSTRUMENTS) ×1 IMPLANT
DRIVER NDLE MEGA SUTCUT DVNCXI (INSTRUMENTS) ×1 IMPLANT
ELECT PENCIL ROCKER SW 15FT (MISCELLANEOUS) ×1 IMPLANT
ELECT REM PT RETURN 15FT ADLT (MISCELLANEOUS) ×1 IMPLANT
FORCEPS CADIERE DVNC XI (FORCEP) ×1 IMPLANT
G-TUBE MIC 18FR ENFIT ADLT (TUBING) IMPLANT
G-TUBE MIC BOLUS 18FR ENFIT (TUBING) IMPLANT
G-TUBE MIC BOLUS 22FR ENFIT (TUBING) IMPLANT
G-TUBE MIC BOLUS 24FR ENFIT (CATHETERS) IMPLANT
GAUZE 4X4 16PLY ~~LOC~~+RFID DBL (SPONGE) ×1 IMPLANT
GLOVE BIOGEL PI IND STRL 7.0 (GLOVE) ×2 IMPLANT
GLOVE SURG SS PI 7.0 STRL IVOR (GLOVE) ×2 IMPLANT
GOWN STRL REUS W/ TWL LRG LVL3 (GOWN DISPOSABLE) ×2 IMPLANT
GOWN STRL REUS W/ TWL XL LVL3 (GOWN DISPOSABLE) IMPLANT
GRASPER SUT TROCAR 14GX15 (MISCELLANEOUS) ×1 IMPLANT
GRASPER TIP-UP FEN DVNC XI (INSTRUMENTS) ×1 IMPLANT
IRRIG SUCT STRYKERFLOW 2 WTIP (MISCELLANEOUS) ×1 IMPLANT
IRRIGATION SUCT STRKRFLW 2 WTP (MISCELLANEOUS) ×1 IMPLANT
KIT BASIN OR (CUSTOM PROCEDURE TRAY) ×1 IMPLANT
KIT TURNOVER KIT A (KITS) IMPLANT
LUBRICANT JELLY K Y 4OZ (MISCELLANEOUS) IMPLANT
MARKER SKIN DUAL TIP RULER LAB (MISCELLANEOUS) ×1 IMPLANT
NDL HYPO 22X1.5 SAFETY MO (MISCELLANEOUS) ×1 IMPLANT
NEEDLE HYPO 22X1.5 SAFETY MO (MISCELLANEOUS) ×1 IMPLANT
PACK CARDIOVASCULAR III (CUSTOM PROCEDURE TRAY) ×1 IMPLANT
SCISSORS LAP 5X35 DISP (ENDOMECHANICALS) IMPLANT
SCISSORS MNPLR CVD DVNC XI (INSTRUMENTS) ×1 IMPLANT
SEAL UNIV 5-12 XI (MISCELLANEOUS) ×3 IMPLANT
SEALER VESSEL EXT DVNC XI (MISCELLANEOUS) ×1 IMPLANT
SET IRRIG Y TYPE TUR BLADDER L (SET/KITS/TRAYS/PACK) IMPLANT
SET TUBE SMOKE EVAC HIGH FLOW (TUBING) ×1 IMPLANT
SLEEVE Z-THREAD 5X100MM (TROCAR) ×1 IMPLANT
SOL ELECTROSURG ANTI STICK (MISCELLANEOUS) IMPLANT
SOLUTION ANTFG W/FOAM PAD STRL (MISCELLANEOUS) ×1 IMPLANT
SOLUTION ELECTROSURG ANTI STCK (MISCELLANEOUS) IMPLANT
SPIKE FLUID TRANSFER (MISCELLANEOUS) ×1 IMPLANT
STRIP CLOSURE SKIN 1/2X4 (GAUZE/BANDAGES/DRESSINGS) IMPLANT
SUT ETHIBOND 0 36 GRN (SUTURE) ×2 IMPLANT
SUT ETHILON 2 0 PS N (SUTURE) IMPLANT
SUT MNCRL AB 4-0 PS2 18 (SUTURE) ×1 IMPLANT
SUT SILK 0 SH 30 (SUTURE) IMPLANT
SUT SILK 2 0 SH (SUTURE) ×3 IMPLANT
SUT SILK 3 0 SH 30 (SUTURE) ×5 IMPLANT
SYR 20ML LL LF (SYRINGE) ×2 IMPLANT
TIP INNERVISION DETACH 40FR (MISCELLANEOUS) IMPLANT
TIP INNERVISION DETACH 50FR (MISCELLANEOUS) IMPLANT
TIP INNERVISION DETACH 56FR (MISCELLANEOUS) IMPLANT
TOWEL OR 17X26 10 PK STRL BLUE (TOWEL DISPOSABLE) ×1 IMPLANT
TRAY FOLEY MTR SLVR 16FR STAT (SET/KITS/TRAYS/PACK) IMPLANT
TRAY LAPAROSCOPIC (CUSTOM PROCEDURE TRAY) ×1 IMPLANT
TROCAR KII 8X100ML NONTHREADED (TROCAR) ×1 IMPLANT
TROCAR Z-THREAD OPTICAL 5X100M (TROCAR) ×1 IMPLANT
TUBE GASTRO 18FR ENFIT (TUBING) IMPLANT
TUBE GASTRO BOLUS 18FR ENFIT (TUBING) ×1 IMPLANT
TUBE GASTRO BOLUS 22FR ENFIT (TUBING) IMPLANT
TUBE GASTRO BOLUS 24FR ENFIT (CATHETERS) IMPLANT

## 2023-07-17 NOTE — Anesthesia Procedure Notes (Signed)
 Procedure Name: Intubation Date/Time: 07/17/2023 7:54 AM  Performed by: Orest Dikes, CRNAPre-anesthesia Checklist: Patient identified, Emergency Drugs available, Suction available and Patient being monitored Patient Re-evaluated:Patient Re-evaluated prior to induction Oxygen Delivery Method: Circle system utilized Preoxygenation: Pre-oxygenation with 100% oxygen Induction Type: IV induction Laryngoscope Size: Mac and 4 Grade View: Grade I Tube type: Oral Tube size: 7.0 mm Number of attempts: 1 Airway Equipment and Method: Stylet Placement Confirmation: ETT inserted through vocal cords under direct vision, positive ETCO2 and breath sounds checked- equal and bilateral Secured at: 22 cm Tube secured with: Tape Dental Injury: Teeth and Oropharynx as per pre-operative assessment

## 2023-07-17 NOTE — Op Note (Signed)
 Preoperative diagnosis: giant hiatal hernia  Postoperative diagnosis: same   Procedure: robotic paraesophageal hernia repair, laparoscopic gastrostomy tube insertion, upper endoscopy  Surgeon: Feliciana Rossetti, M.D.  Asst: Ivar Drape  Anesthesia: general  Indications for procedure: Dana Romero is a 73 y.o. year old female with symptoms of reflux, regurgitation and dysphagia. She had a large hiatal hernia on UGI and Ct scan. She had some esophageal dilation on imaging also. She was consented for robotic hiatal hernia repair and g tube.  Description of procedure: The patient was brought into the operative suite. Anesthesia was administered with General endotracheal anesthesia. WHO checklist was applied. The patient was then placed in supine position. The area was prepped and draped in the usual sterile fashion.  Next, a right subcostal incision was made. A 5mm trocar was used to gain access to the peritoneal cavity by optical entry technique. Pneumoperitoneum was applied with a high flow and low pressure. The laparoscope was reinserted to confirm position. An 8 mm trocar was placed in the left mid abdomen and an 8 mm trocar was placed in the left lateral abdomen. An 8 mm trocar was placed in the right mid abdomen. A 8 mm trocar was placed in the periumbilical right abdomen. Bilateral TAP blocks were placed with Marcaine.  A Nathanson retractor was placed in the subxiphoid space and used to retract the left lobe of the liver.  The hiatal hernia appeared large and contained greater than 50% of the stomach. The pars flaccida was divided with harmonic scalpel The peritoneum of the right crus was divided and the sac separated from the chest contents. This plane was continued anteriorly and to the left crus. Next, the posterior area was dissected free. Additional care was used to dissect attachments to the chest to the sac and esophagus to improve mobility. The esophagus was completely freed from  surrounding attachments. Care was taken to avoid injury to the vagus nerves. The sac was divided from the GE junction and removed.  The crus was repaired with 6 interrupted 2-0 ethibond sutures showing appropriate sizing of the crus. There was about 2 cm of esophagus seen below the hiatus closure. At this time a performed and upper endoscopy showing moderate mid esophageal dilation, GE junction identified 2 cm within the abdomen at 35 cm from the incisors. No thermal injuries were seen on stomach or esophagus on endoscopy. Due to dilation and swallowing issues, I elected not to perform fundoplication and instead perform g tube based gastropexy.  Next, a 2-0 silk was used to make a pursestring in the distal body of the anterior stomach. The robot was undocked. A left upper quadrant incision was made. An 18 fr g tube was introduced into the abdomen. A gastrotomy was made with cautery in the center of the pursestring area. The g tube was easily introduced. The pursestring was tied down. 1 3-0 silk and 1 0 silk were used for gastropexy superior and inferior to the g tube and sutured to the fascia with suture passer. The g tube was tightened to 6 cm at the skin.  Hemostasis was inspected and intact. Pneumoperitoneum was removed. All trocars were removed. All incisions were closed with 4-0 monocryl subcuticular suture. Dermabond was placed for dressing. The patient awoke from anesthesia and was brought to pacu in stable condition.   Findings: giant hiatal hernia, 2 cm of esophagus  Specimen: none  Implant: 18 fr   Blood loss: 10 ml  Local anesthesia: 30 ml  Complications: none  Feliciana Rossetti, M.D. General, Bariatric, & Minimally Invasive Surgery [facility]

## 2023-07-17 NOTE — H&P (Signed)
 New Consultation  HIATAL HERNIA   Subjective   Dana Romero is a 74 y.o. female new patient in today for: History of Present Illness The patient, with a history of gallbladder removal and diabetes, presents with a chief complaint of difficulty swallowing and discomfort due to a hiatal hernia. The patient reports having to eat extremely slowly and avoid lying down after eating to manage the discomfort. The patient also reports feeling like food gets stuck and does not go down. The patient has had this issue for a while and has had two dilation procedures performed by Dr. Levon Hedger, which did improve the symptoms but now things have worsened. The patient also experiences heartburn and reflux, which are managed with medication.  In addition to the hiatal hernia, the patient has been on a diabetes medication, Ozempic, which initially caused severe side effects including vomiting and significant weight loss of seventy pounds. The patient has since gained some of the weight back. The patient also takes pain medication daily for an unspecified condition. The patient has not had any other surgeries besides the gallbladder removal.    No data to display      Outpatient Medications Prior to Visit  Medication Sig Dispense Refill  ADVAIR HFA 115-21 mcg/actuation inhaler 0  albuterol (PROVENTIL) 2.5 mg /3 mL (0.083 %) nebulizer solution 0  albuterol 90 mcg/actuation inhaler 0  ALPRAZolam (XANAX) 1 MG tablet TAKE 1 2 TO 1 (ONE HALF TO ONE) TABLET BY MOUTH ONCE DAILY 0  amitriptyline (ELAVIL) 25 MG tablet Take 25 mg by mouth nightly 6  estradiol (VIVELLE-DOT) patch 0.075 mg/24hr APPLY 1 PATCH TOPICALLY TWICE A WEEK 13  FLUoxetine (PROZAC) 20 MG capsule TAKE 4 CAPSULES BY MOUTH AT BEDTIME 0  HYDROcodone-acetaminophen (NORCO) 10-325 mg tablet TAKE 1 TABLET BY MOUTH 4 TIMES DAILY AS NEEDED 0  medroxyPROGESTERone (PROVERA) 2.5 MG tablet Take 2.5 mg by mouth once daily 1  promethazine (PHENERGAN) 12.5 MG  tablet TAKE 1 TO 2 TABLETS BY MOUTH AS NEEDED FOR NAUSEA 1   No facility-administered medications prior to visit.   Review of Systems  Constitutional: Negative.  HENT: Negative.  Eyes: Negative.  Respiratory: Negative.  Cardiovascular: Negative.  Gastrointestinal: Positive for heartburn and nausea.  Genitourinary: Negative.  Musculoskeletal: Negative.  Skin: Negative.  Neurological: Negative.  Endo/Heme/Allergies: Negative.  Psychiatric/Behavioral: Negative.    Objective   Vitals:  06/21/23 1329  BP: (!) 179/87  Pulse: 78  Temp: 36.7 C (98.1 F)  SpO2: 98%  Weight: 71.7 kg (158 lb)  Height: 162.6 cm (5\' 4" )  PainSc: 0-No pain   Body mass index is 27.12 kg/m. Physical Exam Constitutional:  Appearance: Normal appearance.  HENT:  Head: Normocephalic and atraumatic.  Pulmonary:  Effort: Pulmonary effort is normal.  Musculoskeletal:  General: Normal range of motion.  Cervical back: Normal range of motion.  Neurological:  General: No focal deficit present.  Mental Status: She is alert and oriented to person, place, and time. Mental status is at baseline.  Psychiatric:  Mood and Affect: Mood normal.  Behavior: Behavior normal.  Thought Content: Thought content normal.    I reviewed UGI from 08/2022 showing large hiatal hernia as well as some delayed emptying at the GE junction and some esophageal dysmotility  Assessment/Plan:   Assessment & Plan Hiatal Hernia Severe dysphagia with both solids and liquids, causing significant distress and lifestyle modification. Previous dilation procedures have not improved symptoms. No significant weight loss. -Plan for surgical repair of hiatal hernia with laparoscopic or  robotic approach. -Consideration of not performing fundoplication due to existing swallowing issues. -Potential placement of a temporary gastric feeding tube for postoperative nutrition and to promote scar formation.  We discussed the etiology and symptoms  of hiatal hernias. We discussed possible future issues of worsened reflux, early satiety, Cameron's ulcer, and volvulus. We went over surgical options of laparoscopic reduction of the hiatal hernia with identification of the esophagus and stomach, movable of the sac, ensuring appropriate esophageal length into the abdomen, repairing the diaphragm crura, possible placement of mesh, partial fundoplication and other fundoplication options, and possible need for gastrostomy tube. We discussed risks of bleeding, infection, pneumonia, injury to esophagus, injury to stomach, injury to other abdominal or thoracic organs, trouble swallowing, gas bloat, and need for additional surgery. All questions were answered. Patient decided proceed with robotic hiatal hernia without fundoplication and with g tube creation as observation.  Chronic Pain Managed with daily pain medication for the past 12 years. -Continue current pain management regimen.  Obesity Significant weight loss in the past due to medication side effects, with some weight regain. Patient expresses desire to avoid further weight gain. -Encourage patient to consider reducing intake of high-calorie beverages such as Pepsi to aid in weight management.  Plan for follow-up -Schedule surgery for March 2025. -Postoperative follow-up to assess recovery and manage any potential complications.

## 2023-07-17 NOTE — Transfer of Care (Signed)
 Immediate Anesthesia Transfer of Care Note  Patient: Dana Romero  Procedure(s) Performed: REPAIR, HERNIA, HIATAL, ROBOT-ASSISTED INSERTION, GASTROSTOMY TUBE, PERCUTANEOUS EGD (ESOPHAGOGASTRODUODENOSCOPY)  Patient Location: PACU  Anesthesia Type:General  Level of Consciousness: awake, alert , and oriented  Airway & Oxygen Therapy: Patient Spontanous Breathing and Patient connected to face mask oxygen  Post-op Assessment: Report given to RN and Post -op Vital signs reviewed and stable  Post vital signs: Reviewed and stable  Last Vitals:  Vitals Value Taken Time  BP 182/100 07/17/23 1017  Temp    Pulse 87 07/17/23 1022  Resp 18 07/17/23 1022  SpO2 100 % 07/17/23 1022  Vitals shown include unfiled device data.  Last Pain:  Vitals:   07/17/23 0654  TempSrc:   PainSc: 5       Patients Stated Pain Goal: 6 (07/17/23 9147)  Complications: No notable events documented.

## 2023-07-17 NOTE — Anesthesia Postprocedure Evaluation (Signed)
 Anesthesia Post Note  Patient: Dana Romero  Procedure(s) Performed: REPAIR, HERNIA, HIATAL, ROBOT-ASSISTED INSERTION, GASTROSTOMY TUBE, PERCUTANEOUS EGD (ESOPHAGOGASTRODUODENOSCOPY)     Patient location during evaluation: PACU Anesthesia Type: General Level of consciousness: awake and alert Pain management: pain level controlled Vital Signs Assessment: post-procedure vital signs reviewed and stable Respiratory status: spontaneous breathing, nonlabored ventilation, respiratory function stable and patient connected to nasal cannula oxygen Cardiovascular status: stable and blood pressure returned to baseline Anesthetic complications: no   No notable events documented.  Last Vitals:  Vitals:   07/17/23 1325 07/17/23 1429  BP: (!) 154/100 (!) 165/96  Pulse: 91 90  Resp: 18 18  Temp: 37 C 37.3 C  SpO2: 95% 95%    Last Pain:  Vitals:   07/17/23 1429  TempSrc: Oral  PainSc:                  Beryle Lathe

## 2023-07-18 ENCOUNTER — Encounter (HOSPITAL_COMMUNITY): Payer: Self-pay | Admitting: General Surgery

## 2023-07-18 DIAGNOSIS — K449 Diaphragmatic hernia without obstruction or gangrene: Secondary | ICD-10-CM | POA: Diagnosis not present

## 2023-07-18 LAB — CBC
HCT: 40.8 % (ref 36.0–46.0)
Hemoglobin: 12.2 g/dL (ref 12.0–15.0)
MCH: 26.2 pg (ref 26.0–34.0)
MCHC: 29.9 g/dL — ABNORMAL LOW (ref 30.0–36.0)
MCV: 87.6 fL (ref 80.0–100.0)
Platelets: 228 10*3/uL (ref 150–400)
RBC: 4.66 MIL/uL (ref 3.87–5.11)
RDW: 15.1 % (ref 11.5–15.5)
WBC: 12.9 10*3/uL — ABNORMAL HIGH (ref 4.0–10.5)
nRBC: 0 % (ref 0.0–0.2)

## 2023-07-18 LAB — BASIC METABOLIC PANEL
Anion gap: 11 (ref 5–15)
BUN: 11 mg/dL (ref 8–23)
CO2: 23 mmol/L (ref 22–32)
Calcium: 8.6 mg/dL — ABNORMAL LOW (ref 8.9–10.3)
Chloride: 102 mmol/L (ref 98–111)
Creatinine, Ser: 1.13 mg/dL — ABNORMAL HIGH (ref 0.44–1.00)
GFR, Estimated: 51 mL/min — ABNORMAL LOW (ref >60)
Glucose, Bld: 184 mg/dL — ABNORMAL HIGH (ref 70–99)
Potassium: 3.7 mmol/L (ref 3.5–5.1)
Sodium: 136 mmol/L (ref 135–145)

## 2023-07-18 MED ORDER — GLUCERNA SHAKE PO LIQD
237.0000 mL | Freq: Three times a day (TID) | ORAL | Status: DC
  Administered 2023-07-18: 237 mL via ORAL
  Filled 2023-07-18 (×2): qty 237

## 2023-07-18 MED ORDER — OLMESARTAN MEDOXOMIL 40 MG PO TABS: 40.0000 mg | ORAL_TABLET | Freq: Every day | ORAL | 0 refills | Status: DC

## 2023-07-18 MED ORDER — OXYCODONE HCL 5 MG PO TABS: 5.0000 mg | ORAL_TABLET | Freq: Four times a day (QID) | ORAL | 0 refills | Status: AC | PRN

## 2023-07-18 NOTE — Plan of Care (Signed)

## 2023-07-18 NOTE — Progress Notes (Signed)
 IV removed, dressed, belongings packed, instructions reviewed with understanding verbalized, transferred to front entrance via wheelchair.

## 2023-07-18 NOTE — Discharge Summary (Signed)
 Physician Discharge Summary  Dana Romero UJW:[id number] DOB: 04-Sep-1950 DOA: 07/17/2023  PCP: Mattie Marlin, DO  Admit date: 07/17/2023 Discharge date:  07/18/2023   Recommendations for Outpatient Follow-up:   (include homehealth, outpatient follow-up instructions, specific recommendations for PCP to follow-up on, etc.)   Follow-up Information     Joeanne Robicheaux, De Blanch, MD Follow up on 08/09/2023.   Specialty: General Surgery Contact information: [address] [place] [address] [phone]                Discharge Diagnoses:  Principal Problem:   Hiatal hernia   Surgical Procedure: robotic hiatal hernia repair with g tube  Discharge Condition: Good Disposition: Home  Diet recommendation: full liquid diet and protein shakes   Hospital Course:  73 yo female with long standing history of esophageal symptoms presented for surgery and underwent hiatal hernia repair. Post op she did well. She tolerated liquids well. She had moderate diffuse chest pain controlled with medications. She was discharged home POD 1.  Discharge Instructions  Discharge Instructions     Diet full liquid   Complete by: As directed    Discharge wound care:   Complete by: As directed    Shower normal tomorrow. Glue to stay on for 10-14 days. No bandage needed.   Increase activity slowly   Complete by: As directed       Allergies as of 07/18/2023       Reactions   Pregabalin Other (See Comments)   Makes pt "out of it"   Amlodipine Besylate Other (See Comments)   Reaction not cited   Atorvastatin Other (See Comments)   Muscle/joint pain especially hip pain   Clopidogrel Other (See Comments)   Reaction not recalled   Ezetimibe-simvastatin Other (See Comments)   Muscle/joint pain especially hip pain   Losartan Potassium-hctz Nausea Only, Other (See Comments)   "Felt fatigued" too   Losartan Potassium-hctz Other (See Comments)   Made the hips hurt   Morphine  Other (See Comments)   Migraine   Niacin Other (See Comments)   Flushing    Rosuvastatin Other (See Comments)   Muscle/joint pain especially hip pain   Semaglutide Nausea Only   Statins    Hip pain   Toprol Xl [metoprolol Succinate] Other (See Comments)   Makes her feel weak; fatigued   Valsartan Other (See Comments)   Reaction not cited   Aspirin Hives   Diazepam Anxiety, Other (See Comments)   Had reverse effect on patient   Latex Rash        Medication List     TAKE these medications    Advair HFA 115-21 MCG/ACT inhaler Generic drug: fluticasone-salmeterol INHALE 2 PUFFS INTO LUNGS TWICE DAILY What changed: See the new instructions.   albuterol (2.5 MG/3ML) 0.083% nebulizer solution Commonly known as: PROVENTIL USE ONE VIAL IN NEBULIZER EVERY 6 HOURS AS NEEDED FOR WHEEZING OR SHORTNESS OF BREATH   albuterol 108 (90 Base) MCG/ACT inhaler Commonly known as: Ventolin HFA INHALE ONE TO TWO PUFFS BY MOUTH EVERY 6 HOURS AS NEEDED FOR WHEEZING OR SHORTNESS OF BREATH (OR PERSISTENT COUGHING)   Alcohol Swabs Pads Use to check blood sugar daily. E11.9   ALPRAZolam 1 MG tablet Commonly known as: XANAX Take 1 mg by mouth in the morning and at bedtime.   ARIPiprazole 15 MG tablet Commonly known as: ABILIFY Take 15 mg by mouth at bedtime.   Biofreeze 4 % Gel Generic drug: Menthol (Topical Analgesic) Apply  1 application  topically as needed (pain- affected areas).   blood glucose meter kit and supplies Dispense based on patient and insurance preference. Use up to four times daily as directed. (FOR ICD-10 E11.9).   blood glucose meter kit and supplies Kit Dispense based on patient and insurance preference. Use once a day.  DX: E11.9.   carvedilol 40 MG 24 hr capsule Commonly known as: COREG CR Take 1 capsule (40 mg total) by mouth daily.   cyanocobalamin 1000 MCG tablet Commonly known as: VITAMIN B12 Take 1,000 mcg by mouth daily.   diphenhydrAMINE 50 MG  capsule Commonly known as: BENADRYL Take 50 mg by mouth at bedtime.   EPINEPHrine 0.3 mg/0.3 mL Soaj injection Commonly known as: EpiPen 2-Pak Inject 0.3 mLs (0.3 mg total) into the muscle once. What changed:  when to take this reasons to take this   estradiol 0.05 MG/24HR patch Commonly known as: VIVELLE-DOT Place 1 patch onto the skin 2 (two) times a week.   FLUoxetine 40 MG capsule Commonly known as: PROZAC Take 80 mg by mouth at bedtime.   fluticasone 50 MCG/ACT nasal spray Commonly known as: FLONASE Place 2 sprays into both nostrils daily as needed for allergies or rhinitis.   glucose blood test strip Test blood sugar daily.  DX: E11.9   glucose monitoring kit monitoring kit Test blood sugar daily. DX: E11.9   FreeStyle Lite Devi   Lancets Misc Test blood sugar daily. DX: E11.9   Lancing Device Misc Use to test blood sugar daily. E11.9   magnesium oxide 400 MG tablet Commonly known as: MAG-OX Take 400 mg by mouth See admin instructions. Take 400 mg by mouth at 10 AM   meclizine 25 MG tablet Commonly known as: ANTIVERT Take 25 mg by mouth 3 (three) times daily as needed for dizziness or nausea.   medroxyPROGESTERone 2.5 MG tablet Commonly known as: PROVERA TAKE ONE TABLET BY MOUTH ONCE DAILY What changed: when to take this   multivitamin with minerals Tabs tablet Take 1 tablet by mouth daily after breakfast.   Narcan 4 MG/0.1ML Liqd nasal spray kit Generic drug: naloxone Place 1 spray into the nose See admin instructions. Instill 1 spray into the nose every 3 minutes until awake of EMS arrives- for an accidental opioid overdose   olmesartan 40 MG tablet Commonly known as: BENICAR Take 1 tablet (40 mg total) by mouth daily.   ondansetron 4 MG disintegrating tablet Commonly known as: ZOFRAN-ODT Take 4 mg by mouth every 8 (eight) hours as needed for vomiting or nausea (dissolve orally).   oxyCODONE 5 MG immediate release tablet Commonly known as: Oxy  IR/ROXICODONE Take 1 tablet (5 mg total) by mouth every 6 (six) hours as needed for severe pain (pain score 7-10).   oxyCODONE-acetaminophen 10-325 MG tablet Commonly known as: PERCOCET Take 1 tablet by mouth See admin instructions. Take 1 tablet by mouth in the morning, at noontime, and at bedtime- may take 1 additional tablet once a day as needed for pain   pantoprazole 40 MG tablet Commonly known as: PROTONIX TAKE 1 TABLET (40 MG TOTAL) BY MOUTH TWICE A DAY BEFORE MEALS   Systane Balance 0.6 % Soln Generic drug: Propylene Glycol Place 1 drop into both eyes in the morning, at noon, and at bedtime.   VITAMIN C PO Take 1,000 mg by mouth in the morning.   Vitamin D3 50 MCG (2000 UT) Tabs Take 2,000 Units by mouth daily.   ZyrTEC Allergy 10 MG  tablet Generic drug: cetirizine Take 10 mg by mouth at bedtime.               Discharge Care Instructions  (From admission, onward)           Start     Ordered   07/18/23 0000  Discharge wound care:       Comments: Shower normal tomorrow. Glue to stay on for 10-14 days. No bandage needed.   07/18/23 4098            Follow-up Information     Trenton Verne, De Blanch, MD Follow up on 08/09/2023.   Specialty: General Surgery Contact information: [address] [place] [address] [phone]                  The results of significant diagnostics from this hospitalization (including imaging, microbiology, ancillary and laboratory) are listed below for reference.    Significant Diagnostic Studies: No results found.  Labs: Basic Metabolic Panel: Recent Labs  Lab 07/11/23 1443 07/17/23 1534 07/18/23 0516  NA 136  --  136  K 4.3  --  3.7  CL 102  --  102  CO2 24  --  23  GLUCOSE 138*  --  184*  BUN 17  --  11  CREATININE 1.39* 1.20* 1.13*  CALCIUM 8.6*  --  8.6*   Liver Function Tests: No results for input(s): "AST", "ALT", "ALKPHOS", "BILITOT", "PROT", "ALBUMIN" in the last 168  hours.  CBC: Recent Labs  Lab 07/11/23 1443 07/17/23 1534 07/18/23 0516  WBC 9.5 13.7* 12.9*  NEUTROABS 5.8  --   --   HGB 12.0 11.7* 12.2  HCT 39.2 38.4 40.8  MCV 86.5 85.9 87.6  PLT 291 260 228    CBG: Recent Labs  Lab 07/17/23 0628 07/17/23 0902 07/17/23 1130 07/17/23 1621 07/17/23 2112  GLUCAP 182* 189* 138* 167* 175*    Principal Problem:   Hiatal hernia   Time coordinating discharge: 15 min

## 2023-07-18 NOTE — Discharge Instructions (Signed)

## 2023-07-18 NOTE — Progress Notes (Signed)
   07/18/23 0857  TOC Brief Assessment  Insurance and Status Reviewed  Patient has primary care physician Yes  Home environment has been reviewed home with s/o  Prior level of function: independent  Prior/Current Home Services No current home services  Social Drivers of Health Review SDOH reviewed no interventions necessary  Readmission risk has been reviewed Yes  Transition of care needs no transition of care needs at this time

## 2023-08-16 ENCOUNTER — Ambulatory Visit: Payer: 59 | Admitting: Gastroenterology

## 2023-08-20 ENCOUNTER — Encounter: Payer: Self-pay | Admitting: Gastroenterology

## 2023-08-23 ENCOUNTER — Ambulatory Visit: Attending: General Surgery

## 2023-09-28 NOTE — Therapy (Incomplete)
 OUTPATIENT PHYSICAL THERAPY EVALUATION   Patient Name: Dana Romero MRN: [id number] DOB:Jan 30, 1951, 73 y.o., female Today's Date: 09/28/2023  END OF SESSION:   Past Medical History:  Diagnosis Date   Anxiety    Asthma    Caregiver stress 07/04/2011   Chronic pain syndrome    Degenerative disc disease, cervical    Depression    Dyspnea    due to hiatal hernia   Essential hypertension 1998   GERD (gastroesophageal reflux disease)    Gout    Hiatal hernia    History of pancreatitis 06/08/2005   History of stroke    September 2020   N&V (nausea and vomiting) 04/13/2021   Pelvic mass    Pneumonia    x 2   PONV (postoperative nausea and vomiting)    Renal insufficiency    Seasonal allergies    Sinusitis, chronic    Stroke (HCC)    Transfusion history    Type 2 diabetes mellitus (HCC) 1998   Past Surgical History:  Procedure Laterality Date   APPENDECTOMY     ARM DEBRIDEMENT Left    debridement of axilla "spider bite"   BALLOON DILATION N/A 06/30/2021   Procedure: BALLOON DILATION;  Surgeon: Vinetta Greening, DO;  [facility];  Service: Endoscopy;  Laterality: N/A;   BALLOON DILATION N/A 01/18/2022   Procedure: BALLOON DILATION;  Surgeon: Vinetta Greening, DO;  [facility];  Service: Endoscopy;  Laterality: N/A;   BALLOON DILATION N/A 11/13/2022   Procedure: BALLOON DILATION;  Surgeon: Vinetta Greening, DO;  [facility];  Service: Endoscopy;  Laterality: N/A;   BIOPSY  06/30/2021   Procedure: BIOPSY;  Surgeon: Vinetta Greening, DO;  [facility];  Service: Endoscopy;;   BIOPSY  01/18/2022   Procedure: BIOPSY;  Surgeon: Vinetta Greening, DO;  [facility];  Service: Endoscopy;;   BIOPSY  11/13/2022   Procedure: BIOPSY;  Surgeon: Vinetta Greening, DO;  [facility];  Service: Endoscopy;;   BREAST SURGERY     breast cyst x2 -last '12   CATARACT EXTRACTION W/PHACO Left 11/25/2021   Procedure:  CATARACT EXTRACTION PHACO AND INTRAOCULAR LENS PLACEMENT (IOC);  Surgeon: Tarri Farm, MD;  Location: [facility];  Service: Ophthalmology;  Laterality: Left;  CDE 4.11   CATARACT EXTRACTION W/PHACO Right 01/06/2022   Procedure: CATARACT EXTRACTION PHACO AND INTRAOCULAR LENS PLACEMENT (IOC);  Surgeon: Tarri Farm, MD;  Location: [facility];  Service: Ophthalmology;  Laterality: Right;  CDE 4.97   CHOLECYSTECTOMY N/A 08/11/2015   Procedure: LAPAROSCOPIC CHOLECYSTECTOMY;  Surgeon: Alphonso Aschoff Kinsinger, MD;  Location: [facility];  Service: General;  Laterality: N/A;   COLONOSCOPY WITH PROPOFOL  N/A 11/13/2022   Procedure: COLONOSCOPY WITH PROPOFOL ;  Surgeon: Vinetta Greening, DO;  [facility];  Service: Endoscopy;  Laterality: N/A;  8:30 am, asa 3   ESOPHAGOGASTRODUODENOSCOPY N/A 07/17/2023   Procedure: EGD (ESOPHAGOGASTRODUODENOSCOPY);  Surgeon: Dorrie Gaudier, Alphonso Aschoff, MD;  Location: [facility];  Service: General;  Laterality: N/A;   ESOPHAGOGASTRODUODENOSCOPY (EGD) WITH PROPOFOL  N/A 06/30/2021   Surgeon: Vinetta Greening, DO;   Moderate size hiatal hernia, moderate Schatzki's ring dilated, gastritis biopsied, normal examined duodenum.  Pathology with gastric antral and oxyntic mucosa with nonspecific reactive gastropathy, negative for H. pylori.   ESOPHAGOGASTRODUODENOSCOPY (EGD) WITH PROPOFOL  N/A 01/18/2022   Procedure: ESOPHAGOGASTRODUODENOSCOPY (EGD) WITH PROPOFOL ;  Surgeon: Vinetta Greening, DO;  [facility];  Service: Endoscopy;  Laterality:  N/A;  1:15 pm   ESOPHAGOGASTRODUODENOSCOPY (EGD) WITH PROPOFOL  N/A 11/13/2022   Procedure: ESOPHAGOGASTRODUODENOSCOPY (EGD) WITH PROPOFOL ;  Surgeon: Vinetta Greening, DO;  [facility];  Service: Endoscopy;  Laterality: N/A;   ESOPHAGOGASTRODUODENOSCOPY (EGD) WITH PROPOFOL  N/A 03/13/2023   Procedure: ESOPHAGOGASTRODUODENOSCOPY (EGD) WITH PROPOFOL ;  Surgeon: Urban Garden, MD;  [facility];  Service: Gastroenterology;   Laterality: N/A;  945am, asa 3   LAPAROSCOPIC INSERTION GASTROSTOMY TUBE N/A 07/17/2023   Procedure: INSERTION, GASTROSTOMY TUBE, PERCUTANEOUS;  Surgeon: Kinsinger, Alphonso Aschoff, MD;  Location: [facility];  Service: General;  Laterality: N/A;   POLYPECTOMY  11/13/2022   Procedure: POLYPECTOMY;  Surgeon: Vinetta Greening, DO;  [facility];  Service: Endoscopy;;   SAVORY DILATION N/A 03/13/2023   Procedure: Dixie Frederickson DILATION;  Surgeon: Urban Garden, MD;  [facility];  Service: Gastroenterology;  Laterality: N/A;   TONSILLECTOMY     TONSILLECTOMY AND ADENOIDECTOMY     TUBAL LIGATION     post partum   XI ROBOTIC ASSISTED HIATAL HERNIA REPAIR N/A 07/17/2023   Procedure: REPAIR, HERNIA, HIATAL, ROBOT-ASSISTED;  Surgeon: Kinsinger, Alphonso Aschoff, MD;  Location: [facility];  Service: General;  Laterality: N/A;   Patient Active Problem List   Diagnosis Date Noted   Hiatal hernia 07/17/2023   Large hiatal hernia 05/17/2023   Generalized abdominal pain 11/23/2021   N&V (nausea and vomiting) 04/13/2021   Dysphagia 04/13/2021   Constipation 04/13/2021   Abdominal pain, chronic, epigastric 04/13/2021   Gout 05/18/2020   Gastroesophageal reflux disease 05/18/2020   Asthma 05/18/2020   Nausea 05/18/2020   Menopausal and female climacteric states 05/18/2020   Pure hypercholesterolemia 04/18/2016   Allergic rhinitis 05/09/2012   Essential hypertension 07/26/2007   DM II (diabetes mellitus, type II), controlled (HCC) 11/20/2006    Class: History of   Anxiety state 11/20/2006   Chronic kidney disease 11/20/2006   Osteoarthritis 11/20/2006   DIABETIC PERIPHERAL NEUROPATHY 02/21/2006    PCP: Lazoff, Shawn P, DO   REFERRING PROVIDER:   Kinsinger, Alphonso Aschoff, MD    REFERRING DIAG: R29.6 (ICD-10-CM) - Repeated falls   Rationale for Evaluation and Treatment:  Rehabiliation  THERAPY DIAG:  No diagnosis found.  ONSET DATE: S/P hernia repair 07/17/23, chronic pain, frequent  falls most recent was ***   SUBJECTIVE:                                                                                                                                                                                           SUBJECTIVE STATEMENT: The patient, with a recent surgical history, presents with low  energy, lightheadedness, and frequent falls post-surgery. The patient reports feeling lightheaded and has fallen four times since the surgery, always falling backwards. The patient has a history of frequent falls, with about thirty falls in the past three years   PERTINENT HISTORY:  See above PMH, post op hernia repair, frequent falls  PAIN: *** NPRS scale: /10 upon arrival Pain location: Pain description: constant, achy, sharp Aggravating factors:  Relieving factors: rest, meds   PRECAUTIONS: ,  Fall  RED FLAGS: None   WEIGHT BEARING RESTRICTIONS:  No  FALLS:  Has patient fallen in last 6 months? Yes. Number of falls ***   PLOF:  {PLOF:24004}  PATIENT GOALS:  ***  OBJECTIVE:  Note: Objective measures were completed at Evaluation unless otherwise noted.  DIAGNOSTIC FINDINGS:  No recent imaging in chart  PATIENT SURVEYS:  Patient-Specific Activity Scoring Scheme  "0" represents "unable to perform." "10" represents "able to perform at prior level. 0 1 2 3 4 5 6 7 8 9  10 (Date and Score)   Activity Eval     1. ***      2. ***      3. ***    4.    5.    Score ***    Total score = sum of the activity scores/number of activities Minimum detectable change (90%CI) for average score = 2 points Minimum detectable change (90%CI) for single activity score = 3 points   POSTURE:  {posture:25561}  GAIT: Assistive device utilized: {Assistive devices:23999} Level of assistance: {Levels of assistance:24026} Comments: Decreased gait speed, pain and difficulty with prolonged walking over ***   PALPATION: ***  {PT ROM TABLES:29304}  SPECIAL TESTS:  {PT  Special Tests:29286}  FUNCTIONAL TESTS:  Eval BERG BALANCE TUG TEST 5 times sit to stand test m-CTSIB (Modified Clinical Test for Sensory Integration of Balance): Condition 1: feet together eyes open ***seconds. Condition 2: feet together eyes closed ***seconds. Condition 3: Foam surface feet together eyes open ***seconds. Condition 4: Foam surface feet together eyes closed *** seconds. Total score ***/120                                                                                                                            TREATMENT DATE:  Eval HEP creation and review with demonstration and trial set preformed, see below for details Selfcare:    PATIENT EDUCATION: Education details: HEP, PT plan of care, selfcare Person educated: Patient Education method: Explanation, Demonstration, Verbal cues, and Handouts Education comprehension: verbalized understanding, further education recommended   HOME EXERCISE PROGRAM: ***  ASSESSMENT:  CLINICAL IMPRESSION: Patient referred to PT for repeated falls, general weakness and deconditioning that has been worse since recent hernia surgery. Patient will benefit from skilled PT to improve overall function and to address impairments and limitations listed below.  OBJECTIVE IMPAIRMENTS: decreased activity tolerance for ADL's, difficulty walking, decreased balance, decreased endurance, decreased mobility, decreased ROM, decreased strength, impaired flexibility, impaired UE/LE use, and pain.  ACTIVITY LIMITATIONS: bending, lifting, carry, reaching, locomotion, cleaning, community activity, driving,  PERSONAL FACTORS: see above PMH  also affecting patient's functional outcome.  REHAB POTENTIAL: {rehabpotential:25112}  CLINICAL DECISION MAKING: {clinical decision making:25114}  EVALUATION COMPLEXITY: {Evaluation complexity:25115}    GOALS: Short term PT Goals Target date: *** Pt will be I and compliant with HEP. Baseline:  Goal status:  New Pt will decrease pain by 25% overall Baseline: Goal status: New  Long term PT goals Target date:*** Pt will improve ROM to [facility] to improve functional mobility Baseline: Goal status: New Pt will improve  strength to at least 4+/5 MMT to improve functional strength Baseline: Goal status: New Pt will improve Patient specific functional scale (PSFS) to at least /10 to show improved function level Baseline: Goal status: New Pt will reduce pain to overall less than 3/10 with usual activity and work activity. Baseline: Goal status: New Pt will be able to ambulate community distances at least 1000 ft WNL gait pattern without complaints Baseline: Goal status: New  PLAN: PT FREQUENCY: 1-3 times per week   PT DURATION: 6-8 weeks  PLANNED INTERVENTIONS (unless contraindicated): aquatic PT, Canalith repositioning, cryotherapy, Electrical stimulation, Iontophoresis with 4 mg/ml dexamethasome, Moist heat, traction, Ultrasound, gait training, Therapeutic exercise, balance training, neuromuscular re-education, patient/family education, manual techniques, passive ROM, dry needling, taping, vasopnuematic device, vestibular, spinal manipulations, joint manipulations {rehab planned interventions:25118::"97110-Therapeutic exercises","97530- Therapeutic [phone]- Neuromuscular re-education","97535- Self HQIO","96295- Manual therapy"}  PLAN FOR NEXT SESSION: *** NEXT MD VISIT: Mick Alamin, PT,DPT 09/28/2023, 9:24 AM

## 2023-10-03 ENCOUNTER — Ambulatory Visit

## 2023-10-08 ENCOUNTER — Ambulatory Visit: Attending: General Surgery | Admitting: Physical Therapy

## 2023-11-19 ENCOUNTER — Encounter (HOSPITAL_COMMUNITY): Payer: Self-pay

## 2023-11-19 ENCOUNTER — Other Ambulatory Visit (HOSPITAL_COMMUNITY): Payer: Self-pay | Admitting: Family Medicine

## 2023-11-19 ENCOUNTER — Ambulatory Visit (HOSPITAL_COMMUNITY)
Admission: RE | Admit: 2023-11-19 | Discharge: 2023-11-19 | Disposition: A | Discharge status: Home / Self Care | Source: Ambulatory Visit | Attending: Family Medicine | Admitting: Family Medicine

## 2023-11-19 DIAGNOSIS — R110 Nausea: Secondary | ICD-10-CM

## 2023-11-19 DIAGNOSIS — R1084 Generalized abdominal pain: Secondary | ICD-10-CM

## 2023-11-19 NOTE — Progress Notes (Signed)
 [facility] [place]  Return Patient Visit Dana Romero DOB: 11/11/1950  MRN: [id number] Visit Date: 11/19/2023  Encounter Provider: Laneta Tanda Agent, PA-C  Subjective Dana Romero is a 73 y.o. female who presents for Nausea (Pt states she has had nausea and vomiting x's 1 week. Pt states she really weak.  ) History of Present Illness The patient is a 73 year old female presenting for nausea and abdominal pain over the last week.  She has been experiencing intermittent nausea and vomiting for the past week, with the last episode of vomiting occurring 3 days ago. The vomiting was not severe, occurring about 4 times over 2 days. She reports no recent travel, suspicious food or drink consumption, or antibiotic use. Her appetite has been poor since the onset of these symptoms, and she has been unable to eat since the vomiting started. She has been attempting to stay hydrated with Coke, but has not tried water , Gatorade, or Powerade. She also reports some diarrhea, which initially was complete liquid but has since improved and is now described as softer than normal. She is unaware of any recent illnesses in her close contacts. She experienced a low-grade fever last week, with a peak temperature of 100.9 degrees. She continues to feel nauseated today. She has run out of Zofran , which she found helpful when she first fell ill. She reports no urinary symptoms such as burning during urination, increased frequency, or blood in the urine.  She is experiencing abdominal pain that worsens with eating. She reports no presence of blood or black tarry stools. She had upper respiratory symptoms including runny nose, stuffy nose, and sore throat, which have since improved. Her last bowel movement was the day before yesterday, which was loose but not liquid, and she did not notice any blood or black tarry substance in it. She underwent hiatal hernia repair surgery in 07/2023 and has been doing well  since then until the onset of her current symptoms last week. She has history of surgery for an ectopic pregnancy.  PAST SURGICAL HISTORY: - Hiatal hernia repair in 07/2023 - Surgery for ectopic pregnancy (date not specified)     Behavioral Health Screening  Patient Health Questionnaire-2 Score: 0 (11/19/2023  4:03 PM)      Patient's Depression screening is Negative   Depression Plan: Normal/Negative Screening  Objective Blood pressure 128/77, pulse 84, temperature 97.9 F (36.6 C), SpO2 99%. Physical Exam   Physical Exam Constitutional:      General: She is not in acute distress.    Appearance: Normal appearance. She is not ill-appearing, toxic-appearing or diaphoretic.  HENT:     Head: Normocephalic and atraumatic.     Right Ear: External ear normal.     Left Ear: External ear normal.     Nose: Nose normal.   Eyes:     Extraocular Movements: Extraocular movements intact.    Cardiovascular:     Rate and Rhythm: Normal rate and regular rhythm.     Heart sounds: Normal heart sounds. No murmur heard.    No friction rub. No gallop.  Pulmonary:     Effort: Pulmonary effort is normal. No respiratory distress.     Breath sounds: Normal breath sounds. No stridor. No wheezing, rhonchi or rales.  Abdominal:     General: Abdomen is flat. There is no distension.     Palpations: Abdomen is soft. There is no mass.     Tenderness: There is abdominal tenderness. There is no guarding or rebound.  Hernia: No hernia is present.     Comments: Exam limited due to patient mobility and performed full exam in wheelchair; diffuse abdominal tenderness to palpation, predominantly in RUQ, LUQ, and LLQ.  Unable to elicit CVA tenderness due to positioning of patient.   Musculoskeletal:     Cervical back: Normal range of motion and neck supple.   Skin:    General: Skin is warm and dry.     Capillary Refill: Capillary refill takes less than 2 seconds.   Neurological:     General: No  focal deficit present.     Mental Status: She is alert and oriented to person, place, and time. Mental status is at baseline.   Psychiatric:        Mood and Affect: Mood normal.        Behavior: Behavior normal.        Thought Content: Thought content normal.        Judgment: Judgment normal.     Medical History: Medical History[1]  Patient Active Problem List   Diagnosis Date Noted  . History of CVA (cerebrovascular accident) 02/21/2023  . CKD stage 3a, GFR 45-59 ml/min (CMD) 02/21/2023  . Controlled type 2 diabetes mellitus with chronic kidney disease    (CMD) 11/21/2022  . Dizziness 12/09/2020  . Closed fracture of odontoid process of axis (HCC) 09/07/2020  . Stenosis of right vertebral artery 06/09/2020  . Type 2 diabetes mellitus without complication, without long-term current use of insulin  (HCC) 06/09/2020  . Elevated liver enzymes 06/09/2020  . Gastroesophageal reflux disease with esophagitis without hemorrhage 06/09/2020  . Anxiety and depression 06/09/2020  . Essential hypertension 06/09/2020  . Dyslipidemia 06/09/2020  . Other chronic pain 06/09/2020  . Mild intermittent asthma without complication 06/09/2020   Current Medications:  Medications Ordered Prior to Encounter[2] Current Medications[3]  Allergies: Allergies[4]   Immunizations:  Immunization History  Administered Date(s) Administered  . Influenza whole 02/21/2006  . Influenza, Injectable, Quadrivalent, Preservative Free 04/12/2015, 01/18/2016  . Influenza, Injectable, Quadrivalent, With Preservative 02/05/2018  . Influenza, Unspecified 02/05/2018, 02/07/2019, 02/06/2020  . Influenza, split virus, trivalent, preservative 01/17/2012  . PPD Test 01/12/2012, 04/24/2014, 04/12/2015, 04/18/2016  . Pneumococcal Conjugate 13-Valent 11/25/2014  . Pneumococcal Polysaccharide Vaccine, 23 Valent (PNEUMOVAX-23) 2Y+ 08/21/2005, 01/18/2016  . TD vaccine (ADULT)PF(TENIVAC) 7Y+ 05/08/1998  . TDAP VACCINE  (BOOSTRIX,ADACEL) 7Y+ 05/13/2010    Surgical History- Surgical History[5]  Family history- Family History[6] Social history- Social History[7]   Labs: No results found for this or any previous visit (from the past week).    Assessment & Plan 1. Nausea (Primary) - Experiencing nausea and vomiting intermittently for the past week, with the last episode of vomiting occurring 3 days ago. - Reports no recent travel, suspicious food or drink consumption, or antibiotic use. Had about four episodes of vomiting over two days and has been unable to keep liquids down initially but is now able to keep some liquids down. - Discussed the importance of keeping hydrated with electrolytes and the use of Zofran  (ondansetron ) which previously helped. Advised against use of sodas or artificially-sweetened beverages and focus on electrolytes and water . - Prescription for Zofran  (ondansetron ) will be sent to the pharmacy. Advised to seek immediate medical attention if condition deteriorates significantly, characterized by severe pain or inability to retain liquids due to vomiting. - ondansetron  (ZOFRAN -ODT) 4 mg disintegrating tablet; Dissolve 1 tablet (4 mg total) on tongue every 8 (eight) hours as needed for nausea.  Dispense: 30 tablet; Refill: 0 -  CBC with Differential - Comprehensive Metabolic Panel - Lipase - CT Abdomen Pelvis WO Contrast; Future - CT Abdomen Pelvis WO Contrast  2. Generalized abdominal pain - Reports generalized abdominal pain that sometimes worsens with eating. Pain has been present since last week. Diffusely tender on exam. -Ordering STAT CT of the abdomen and pelvis WO today to further investigate the cause of the pain. Blood work will also be conducted today. - Patient prefers call to determine first available CT scan appointment rather than waiting in-office for first available. She prefers [facility]. If no available appointments tomorrow, will then call [facility].  Reviewed return/emergency precautions with patient, and she expresses understanding.  -ADDENDUM: Unable to contact [facility]. Scheduled for Zelda Salmon for patient to go NOW for STAT CT - CBC with Differential - Comprehensive Metabolic Panel - Lipase - CT Abdomen Pelvis WO Contrast; Future - CT Abdomen Pelvis WO Contrast     Problem List Items Addressed This Visit   None Visit Diagnoses       Nausea    -  Primary   Relevant Medications   ondansetron  (ZOFRAN -ODT) 4 mg disintegrating tablet   Other Relevant Orders   CBC with Differential   Comprehensive Metabolic Panel   Lipase   CT Abdomen Pelvis WO Contrast     Generalized abdominal pain       Relevant Orders   CBC with Differential   Comprehensive Metabolic Panel   Lipase   CT Abdomen Pelvis WO Contrast       Monitor: The problem is newly identified Evaluation: Labs/tests ordered, see encounter summary Assessment/Treatment PRN  Please contact my office for worsening conditions or problems, and seek emergency medical treatment and/or call 911 if you or your family deems either necessary.  Patient Instructions   Treatment Plan: Prescription for Zofran  (ondansetron ) to help with nausea and vomiting. Blood work to be conducted today. CT scan of the abdomen and pelvis to investigate the cause of abdominal pain. I have ordered this STAT for [facility] in [place]. If they can't perform this tomorrow, I will have Jimmea call [facility]. We will call you tonight with the first available scan so we can get this performed ASAP.   Follow-Up Instructions: Keep hydrated with electrolytes such as Gatorade or Powerade. I would avoid drinks high in sugar or artificial sweeteners while sick. Focus on water  and electrolyte drinks. Seek immediate medical attention if the condition deteriorates significantly, characterized by severe pain or inability to retain liquids due to vomiting. Keep phone on ring for a call regarding the  scheduling of the CT scan.  Return if symptoms worsen or fail to improve.  Portions of this note were created using DAX Copilot software. Although proofread, errors may be present.  I have personally spent 30 minutes involved in face-to-face and non-face-to-face activities for this patient on the day of the visit.  Professional time spent includes the following activities, in addition to those noted in the documentation:  - preparing to see the patient (e.g., review of recent and/or remote lab/imaging/study results, provider notes, and patient messages/phone calls available in current EMR, CareEverywhere, and scanned records) -obtaining and/or reviewing separately obtained history either through past provider notes, patient phone calls, and/or patient's family member(s)/caregiver(s) -performing a medically appropriate examination and/or evaluation -counseling and educating the patient/family/caregiver -ordering medications, tests, or procedures -documenting clinical information in the electronic or other health record -reviewing most up to date studies or expert consensus guidelines for screening/diagnosing/treating pertinent  conditions/symptoms -independently interpreting results (not separately reported) and communicating results to the patient/family/caregiver -care coordination (not separately reported) -referring and communicating with other health care professionals (when not separately reported)  Laneta Tanda Agent, PA-C 4:38 PM       [1] Past Medical History: Diagnosis Date  . Asthma (CMD)   . Cerebrovascular accident (CVA) (HCC) 06/09/2020  . Diabetes mellitus    (CMD)   . GERD (gastroesophageal reflux disease)   [2] Current Outpatient Medications on File Prior to Visit  Medication Sig Dispense Refill  . albuterol  2.5 mg /3 mL (0.083 %) nebulizer solution Take 3 mL (2.5 mg total) by nebulization every 6 (six) hours as needed for wheezing. 180 mL 0  . albuterol  HFA  (PROVENTIL  HFA;VENTOLIN  HFA;PROAIR  HFA) 90 mcg/actuation inhaler Inhale 2 puffs every 6 (six) hours as needed for wheezing or shortness of breath. 1 each 3  . ALPRAZolam  (XANAX ) 1 mg tablet TAKE 1 TABLET (1 MG TOTAL) BY MOUTH TWICE A DAY AS NEEDED FOR ANXIETY 60 tablet 1  . ARIPiprazole  (ABILIFY ) 15 mg tablet Take 15 mg by mouth Once Daily. 30 tablet 2  . aspirin 81 mg EC tablet Take 1 tablet (81 mg total) by mouth daily.    . carvedilol  (COREG  CR) 40 mg 24 hr capsule Take 40 mg by mouth daily.    SABRA denosumab (PROLIA) 60 mg/mL syrg syringe Inject 1 mL (60 mg total) under the skin every 6 (six) months. 1 mL 1  . esomeprazole  (NexIUM ) 40 mg DR capsule TAKE 1 CAPSULE BY MOUTH EVERY DAY BEFORE BREAKFAST 90 capsule 0  . estradioL  (VIVELLE -DOT) 0.05 mg/24 hr PLACE 1 PATCH ONTO THE SKIN TWICE A WEEK FOR 30 DAYS. 8 patch 0  . FLUoxetine  (PROzac ) 40 mg capsule TAKE 2 CAPSULES BY MOUTH EVERY DAY 180 capsule 3  . fluticasone  propionate (FLONASE ) 50 mcg/spray nasal spray SPRAY 1 SPRAY INTO EACH NOSTRIL EVERY DAY 48 mL 1  . garlic 400 mg tab Take 400 mg by mouth.    SABRA glipiZIDE (GLUCOTROL XL) 5 mg 24 hr tablet Take 1 tablet (5 mg total) by mouth daily.    SABRA glucose blood (OneTouch Ultra Test) test strip 1 each by miscellaneous route Once Daily. 100 strip 2  . glucose monitoring kit kit Check blood sugar once daily. Dx E11.9 1 each 0  . lancets 30 gauge misc 1 each by miscellaneous route Once Daily. 100 each 2  . linaCLOtide  (LINZESS ) 145 mcg cap capsule Take 145 mcg by mouth.    . magnesium  oxide 400 mg (241 mg magnesium ) tab Take 400 mg by mouth Once Daily.    . meclizine  (ANTIVERT ) 25 mg tablet TAKE 1 TABLET BY MOUTH 3 TIMES DAILY AS NEEDED FOR DIZZINESS OR NAUSEA. 90 tablet 0  . medroxyPROGESTERone  (PROVERA ) 2.5 mg tablet TAKE 1 TABLET (2.5 MG TOTAL) BY MOUTH DAILY INDICATIONS: POST-MENOPAUSAL SYMPTOMS. 90 tablet 0  . olmesartan  (BENICAR ) 40 mg tablet TAKE 1 TABLET DAILY 90 tablet 0  .  oxyCODONE -acetaminophen  (PERCOCET) 10-325 mg per tablet Take 1 tablet by mouth every 6 (six) hours as needed.    . Repatha  SureClick 140 mg/mL pnij INJECT 1 ML (140 MG TOTAL) UNDER THE SKIN EVERY 14 DAYS. 6 each 1  . [DISCONTINUED] ondansetron  (ZOFRAN -ODT) 4 mg disintegrating tablet TAKE 1 TABLET BY MOUTH EVERY 8 HOURS AS NEEDED FOR NAUSEA 60 tablet 0  . amLODIPine (NORVASC) 10 mg tablet Take 1 tablet (10 mg total) by mouth daily. 30 tablet 1  No current facility-administered medications on file prior to visit.  [3]  Current Outpatient Medications:  .  albuterol  2.5 mg /3 mL (0.083 %) nebulizer solution, Take 3 mL (2.5 mg total) by nebulization every 6 (six) hours as needed for wheezing., Disp: 180 mL, Rfl: 0 .  albuterol  HFA (PROVENTIL  HFA;VENTOLIN  HFA;PROAIR  HFA) 90 mcg/actuation inhaler, Inhale 2 puffs every 6 (six) hours as needed for wheezing or shortness of breath., Disp: 1 each, Rfl: 3 .  ALPRAZolam  (XANAX ) 1 mg tablet, TAKE 1 TABLET (1 MG TOTAL) BY MOUTH TWICE A DAY AS NEEDED FOR ANXIETY, Disp: 60 tablet, Rfl: 1 .  ARIPiprazole  (ABILIFY ) 15 mg tablet, Take 15 mg by mouth Once Daily., Disp: 30 tablet, Rfl: 2 .  aspirin 81 mg EC tablet, Take 1 tablet (81 mg total) by mouth daily., Disp: , Rfl:  .  carvedilol  (COREG  CR) 40 mg 24 hr capsule, Take 40 mg by mouth daily., Disp: , Rfl:  .  denosumab (PROLIA) 60 mg/mL syrg syringe, Inject 1 mL (60 mg total) under the skin every 6 (six) months., Disp: 1 mL, Rfl: 1 .  esomeprazole  (NexIUM ) 40 mg DR capsule, TAKE 1 CAPSULE BY MOUTH EVERY DAY BEFORE BREAKFAST, Disp: 90 capsule, Rfl: 0 .  estradioL  (VIVELLE -DOT) 0.05 mg/24 hr, PLACE 1 PATCH ONTO THE SKIN TWICE A WEEK FOR 30 DAYS., Disp: 8 patch, Rfl: 0 .  FLUoxetine  (PROzac ) 40 mg capsule, TAKE 2 CAPSULES BY MOUTH EVERY DAY, Disp: 180 capsule, Rfl: 3 .  fluticasone  propionate (FLONASE ) 50 mcg/spray nasal spray, SPRAY 1 SPRAY INTO EACH NOSTRIL EVERY DAY, Disp: 48 mL, Rfl: 1 .  garlic 400 mg tab, Take  400 mg by mouth., Disp: , Rfl:  .  glipiZIDE (GLUCOTROL XL) 5 mg 24 hr tablet, Take 1 tablet (5 mg total) by mouth daily., Disp: , Rfl:  .  glucose blood (OneTouch Ultra Test) test strip, 1 each by miscellaneous route Once Daily., Disp: 100 strip, Rfl: 2 .  glucose monitoring kit kit, Check blood sugar once daily. Dx E11.9, Disp: 1 each, Rfl: 0 .  lancets 30 gauge misc, 1 each by miscellaneous route Once Daily., Disp: 100 each, Rfl: 2 .  linaCLOtide  (LINZESS ) 145 mcg cap capsule, Take 145 mcg by mouth., Disp: , Rfl:  .  magnesium  oxide 400 mg (241 mg magnesium ) tab, Take 400 mg by mouth Once Daily., Disp: , Rfl:  .  meclizine  (ANTIVERT ) 25 mg tablet, TAKE 1 TABLET BY MOUTH 3 TIMES DAILY AS NEEDED FOR DIZZINESS OR NAUSEA., Disp: 90 tablet, Rfl: 0 .  medroxyPROGESTERone  (PROVERA ) 2.5 mg tablet, TAKE 1 TABLET (2.5 MG TOTAL) BY MOUTH DAILY INDICATIONS: POST-MENOPAUSAL SYMPTOMS., Disp: 90 tablet, Rfl: 0 .  olmesartan  (BENICAR ) 40 mg tablet, TAKE 1 TABLET DAILY, Disp: 90 tablet, Rfl: 0 .  oxyCODONE -acetaminophen  (PERCOCET) 10-325 mg per tablet, Take 1 tablet by mouth every 6 (six) hours as needed., Disp: , Rfl:  .  Repatha  SureClick 140 mg/mL pnij, INJECT 1 ML (140 MG TOTAL) UNDER THE SKIN EVERY 14 DAYS., Disp: 6 each, Rfl: 1 .  amLODIPine (NORVASC) 10 mg tablet, Take 1 tablet (10 mg total) by mouth daily., Disp: 30 tablet, Rfl: 1 .  ondansetron  (ZOFRAN -ODT) 4 mg disintegrating tablet, Dissolve 1 tablet (4 mg total) on tongue every 8 (eight) hours as needed for nausea., Disp: 30 tablet, Rfl: 0 [4] Allergies Allergen Reactions  . Pregabalin Other (See Comments)  . Statins-Hmg-Coa Reductase Inhibitors Other (See Comments)    REACTION: intolerance  .  Diazepam  Other (See Comments)    Has reverse effect on patient   . Losartan -Hydrochlorothiazide Other (See Comments)    Pt is unsure   . Metoprolol Succinate Other (See Comments)    Makes her feel weak  . Plavix [Clopidogrel]   . Semaglutide GI  Intolerance  . Valsartan Other (See Comments)    Joint pain  . Latex Flushing and Rash  [5] Past Surgical History: Procedure Laterality Date  . APPENDECTOMY     Procedure: APPENDECTOMY  . BREAST BIOPSY     Procedure: BREAST BIOPSY  . CATARACT EXTRACTION Bilateral 01/03/2022   Procedure: CATARACT EXTRACTION  . CHOLECYSTECTOMY     Procedure: CHOLECYSTECTOMY  . TUBAL LIGATION     Procedure: TUBAL LIGATION  . WISDOM TOOTH EXTRACTION     Procedure: WISDOM TOOTH EXTRACTION  [6] Family History Problem Relation Name Age of Onset  . Hearing loss Mother    . Allergies Mother    . Hypertension Mother    . Diabetes Maternal Grandmother    [7] Social History Socioeconomic History  . Marital status: Divorced  Tobacco Use  . Smoking status: Never    Passive exposure: Past  . Smokeless tobacco: Never  Substance and Sexual Activity  . Alcohol  use: Never   Social Drivers of Health   Food Insecurity: Patient Declined (07/17/2023)   [facility]   Food vital sign   . Within the past 12 months, you worried that your food would run out before you got money to buy more: Patient declined   . Within the past 12 months, the food you bought just didn't last and you didn't have money to get more: Patient declined  Transportation Needs: Patient Declined (07/17/2023)   Received from [facility] - Transportation   . Lack of Transportation (Medical): Patient declined   . Lack of Transportation (Non-Medical): Patient declined  Safety: Patient Declined (07/17/2023)   Received from [facility]   Safety   . Within the last year, have you been afraid of your partner or ex-partner?: Patient declined   . Within the last year, have you been humiliated or emotionally abused in other ways by your partner or ex-partner?: Patient declined   . Within the last year, have you been kicked, hit, slapped, or otherwise physically hurt by your partner or ex-partner?: Patient declined   .  Within the last year, have you been raped or forced to have any kind of sexual activity by your partner or ex-partner?: Patient declined  Living Situation: Patient Declined (07/17/2023)   Received from [facility] Situation   . In the last 12 months, was there a time when you were not able to pay the mortgage or rent on time?: Patient declined   . At any time in the past 12 months, were you homeless or living in a shelter (including now)?: Patient declined

## 2023-11-19 NOTE — Patient Instructions (Addendum)
  Treatment Plan: Prescription for Zofran  (ondansetron ) to help with nausea and vomiting. Blood work to be conducted today. CT scan of the abdomen and pelvis to investigate the cause of abdominal pain. I have ordered this STAT for [facility] in [place]. If they can't perform this tomorrow, I will have Jimmea call [facility]. We will call you tonight with the first available scan so we can get this performed ASAP.   Follow-Up Instructions: Keep hydrated with electrolytes such as Gatorade or Powerade. I would avoid drinks high in sugar or artificial sweeteners while sick. Focus on water  and electrolyte drinks. Seek immediate medical attention if the condition deteriorates significantly, characterized by severe pain or inability to retain liquids due to vomiting. Keep phone on ring for a call regarding the scheduling of the CT scan.

## 2023-11-25 ENCOUNTER — Ambulatory Visit (HOSPITAL_COMMUNITY)
Admission: RE | Admit: 2023-11-25 | Discharge: 2023-11-25 | Disposition: A | Discharge status: Home / Self Care | Source: Ambulatory Visit | Attending: Family Medicine | Admitting: Family Medicine

## 2023-11-25 DIAGNOSIS — R110 Nausea: Secondary | ICD-10-CM | POA: Insufficient documentation

## 2023-11-25 DIAGNOSIS — R1084 Generalized abdominal pain: Secondary | ICD-10-CM | POA: Diagnosis present

## 2023-11-29 ENCOUNTER — Encounter (HOSPITAL_COMMUNITY): Payer: Self-pay | Admitting: *Deleted

## 2023-11-29 ENCOUNTER — Other Ambulatory Visit: Payer: Self-pay

## 2023-11-29 ENCOUNTER — Inpatient Hospital Stay (HOSPITAL_COMMUNITY)
Admission: EM | Admit: 2023-11-29 | Discharge: 2023-12-01 | DRG: 392 | Disposition: A | Discharge status: Home / Self Care | Attending: Family Medicine | Admitting: Family Medicine

## 2023-11-29 ENCOUNTER — Emergency Department (HOSPITAL_COMMUNITY)

## 2023-11-29 DIAGNOSIS — R1013 Epigastric pain: Secondary | ICD-10-CM | POA: Diagnosis not present

## 2023-11-29 DIAGNOSIS — Z8249 Family history of ischemic heart disease and other diseases of the circulatory system: Secondary | ICD-10-CM

## 2023-11-29 DIAGNOSIS — E1122 Type 2 diabetes mellitus with diabetic chronic kidney disease: Secondary | ICD-10-CM | POA: Diagnosis present

## 2023-11-29 DIAGNOSIS — Z818 Family history of other mental and behavioral disorders: Secondary | ICD-10-CM

## 2023-11-29 DIAGNOSIS — R112 Nausea with vomiting, unspecified: Principal | ICD-10-CM | POA: Diagnosis present

## 2023-11-29 DIAGNOSIS — E872 Acidosis, unspecified: Secondary | ICD-10-CM | POA: Diagnosis present

## 2023-11-29 DIAGNOSIS — R1084 Generalized abdominal pain: Secondary | ICD-10-CM

## 2023-11-29 DIAGNOSIS — M109 Gout, unspecified: Secondary | ICD-10-CM | POA: Diagnosis present

## 2023-11-29 DIAGNOSIS — E876 Hypokalemia: Secondary | ICD-10-CM | POA: Diagnosis present

## 2023-11-29 DIAGNOSIS — M5030 Other cervical disc degeneration, unspecified cervical region: Secondary | ICD-10-CM | POA: Diagnosis present

## 2023-11-29 DIAGNOSIS — Z833 Family history of diabetes mellitus: Secondary | ICD-10-CM

## 2023-11-29 DIAGNOSIS — Z800 Family history of malignant neoplasm of digestive organs: Secondary | ICD-10-CM

## 2023-11-29 DIAGNOSIS — K219 Gastro-esophageal reflux disease without esophagitis: Secondary | ICD-10-CM | POA: Diagnosis present

## 2023-11-29 DIAGNOSIS — Z91040 Latex allergy status: Secondary | ICD-10-CM

## 2023-11-29 DIAGNOSIS — Z803 Family history of malignant neoplasm of breast: Secondary | ICD-10-CM

## 2023-11-29 DIAGNOSIS — Z79899 Other long term (current) drug therapy: Secondary | ICD-10-CM

## 2023-11-29 DIAGNOSIS — R7989 Other specified abnormal findings of blood chemistry: Secondary | ICD-10-CM

## 2023-11-29 DIAGNOSIS — F411 Generalized anxiety disorder: Secondary | ICD-10-CM | POA: Diagnosis present

## 2023-11-29 DIAGNOSIS — I129 Hypertensive chronic kidney disease with stage 1 through stage 4 chronic kidney disease, or unspecified chronic kidney disease: Secondary | ICD-10-CM | POA: Diagnosis present

## 2023-11-29 DIAGNOSIS — K769 Liver disease, unspecified: Secondary | ICD-10-CM | POA: Diagnosis not present

## 2023-11-29 DIAGNOSIS — Z8673 Personal history of transient ischemic attack (TIA), and cerebral infarction without residual deficits: Secondary | ICD-10-CM

## 2023-11-29 DIAGNOSIS — Z7951 Long term (current) use of inhaled steroids: Secondary | ICD-10-CM

## 2023-11-29 DIAGNOSIS — Z823 Family history of stroke: Secondary | ICD-10-CM

## 2023-11-29 DIAGNOSIS — R1110 Vomiting, unspecified: Principal | ICD-10-CM | POA: Diagnosis present

## 2023-11-29 DIAGNOSIS — E119 Type 2 diabetes mellitus without complications: Secondary | ICD-10-CM

## 2023-11-29 DIAGNOSIS — Z885 Allergy status to narcotic agent status: Secondary | ICD-10-CM

## 2023-11-29 DIAGNOSIS — G8929 Other chronic pain: Secondary | ICD-10-CM | POA: Diagnosis present

## 2023-11-29 DIAGNOSIS — I10 Essential (primary) hypertension: Secondary | ICD-10-CM | POA: Diagnosis present

## 2023-11-29 DIAGNOSIS — Z9049 Acquired absence of other specified parts of digestive tract: Secondary | ICD-10-CM

## 2023-11-29 DIAGNOSIS — D631 Anemia in chronic kidney disease: Secondary | ICD-10-CM | POA: Diagnosis present

## 2023-11-29 DIAGNOSIS — R1115 Cyclical vomiting syndrome unrelated to migraine: Secondary | ICD-10-CM | POA: Diagnosis present

## 2023-11-29 DIAGNOSIS — J45909 Unspecified asthma, uncomplicated: Secondary | ICD-10-CM | POA: Diagnosis present

## 2023-11-29 DIAGNOSIS — N1831 Chronic kidney disease, stage 3a: Secondary | ICD-10-CM | POA: Diagnosis present

## 2023-11-29 DIAGNOSIS — Z886 Allergy status to analgesic agent status: Secondary | ICD-10-CM

## 2023-11-29 DIAGNOSIS — Z888 Allergy status to other drugs, medicaments and biological substances status: Secondary | ICD-10-CM

## 2023-11-29 DIAGNOSIS — E860 Dehydration: Secondary | ICD-10-CM | POA: Diagnosis present

## 2023-11-29 DIAGNOSIS — G894 Chronic pain syndrome: Secondary | ICD-10-CM | POA: Diagnosis present

## 2023-11-29 LAB — CBC
HCT: 40.6 % (ref 36.0–46.0)
Hemoglobin: 13.6 g/dL (ref 12.0–15.0)
MCH: 28.3 pg (ref 26.0–34.0)
MCHC: 33.5 g/dL (ref 30.0–36.0)
MCV: 84.4 fL (ref 80.0–100.0)
Platelets: 251 K/uL (ref 150–400)
RBC: 4.81 MIL/uL (ref 3.87–5.11)
RDW: 15.3 % (ref 11.5–15.5)
WBC: 8.8 K/uL (ref 4.0–10.5)
nRBC: 0 % (ref 0.0–0.2)

## 2023-11-29 LAB — RAPID URINE DRUG SCREEN, HOSP PERFORMED
Amphetamines: NOT DETECTED
Barbiturates: NOT DETECTED
Benzodiazepines: POSITIVE — AB
Cocaine: NOT DETECTED
Opiates: NOT DETECTED
Tetrahydrocannabinol: NOT DETECTED

## 2023-11-29 LAB — LACTIC ACID, PLASMA
Lactic Acid, Venous: 3 mmol/L (ref 0.5–1.9)
Lactic Acid, Venous: 2.2 mmol/L (ref 0.5–1.9)

## 2023-11-29 LAB — URINALYSIS, ROUTINE W REFLEX MICROSCOPIC
Bilirubin Urine: NEGATIVE
Glucose, UA: 50 mg/dL — AB
Hgb urine dipstick: NEGATIVE
Ketones, ur: NEGATIVE mg/dL
Leukocytes,Ua: NEGATIVE
Nitrite: NEGATIVE
Protein, ur: NEGATIVE mg/dL
Specific Gravity, Urine: 1.012 (ref 1.005–1.030)
pH: 7 (ref 5.0–8.0)

## 2023-11-29 LAB — COMPREHENSIVE METABOLIC PANEL WITH GFR
ALT: 26 U/L (ref 0–44)
AST: 87 U/L — ABNORMAL HIGH (ref 15–41)
Albumin: 3.4 g/dL — ABNORMAL LOW (ref 3.5–5.0)
Alkaline Phosphatase: 203 U/L — ABNORMAL HIGH (ref 38–126)
Anion gap: 17 — ABNORMAL HIGH (ref 5–15)
BUN: 10 mg/dL (ref 8–23)
CO2: 17 mmol/L — ABNORMAL LOW (ref 22–32)
Calcium: 8.8 mg/dL — ABNORMAL LOW (ref 8.9–10.3)
Chloride: 101 mmol/L (ref 98–111)
Creatinine, Ser: 1.23 mg/dL — ABNORMAL HIGH (ref 0.44–1.00)
GFR, Estimated: 46 mL/min — ABNORMAL LOW (ref >60)
Glucose, Bld: 172 mg/dL — ABNORMAL HIGH (ref 70–99)
Potassium: 3.3 mmol/L — ABNORMAL LOW (ref 3.5–5.1)
Sodium: 135 mmol/L (ref 135–145)
Total Bilirubin: 1.5 mg/dL — ABNORMAL HIGH (ref 0.0–1.2)
Total Protein: 6.8 g/dL (ref 6.5–8.1)

## 2023-11-29 LAB — LIPASE, BLOOD: Lipase: 55 U/L — ABNORMAL HIGH (ref 11–51)

## 2023-11-29 LAB — CBG MONITORING, ED: Glucose-Capillary: 145 mg/dL — ABNORMAL HIGH (ref 70–99)

## 2023-11-29 MED ORDER — PANTOPRAZOLE SODIUM 40 MG IV SOLR
40.0000 mg | INTRAVENOUS | Status: DC
  Administered 2023-11-29 – 2023-11-30 (×2): 40 mg via INTRAVENOUS
  Filled 2023-11-29 (×2): qty 10

## 2023-11-29 MED ORDER — FLUTICASONE FUROATE-VILANTEROL 200-25 MCG/ACT IN AEPB
1.0000 | INHALATION_SPRAY | Freq: Every day | RESPIRATORY_TRACT | Status: DC
  Administered 2023-11-30 – 2023-12-01 (×2): 1 via RESPIRATORY_TRACT
  Filled 2023-11-29 (×2): qty 28

## 2023-11-29 MED ORDER — SODIUM CHLORIDE 0.9 % IV BOLUS
1000.0000 mL | Freq: Once | INTRAVENOUS | Status: AC
  Administered 2023-11-29: 1000 mL via INTRAVENOUS

## 2023-11-29 MED ORDER — ENOXAPARIN SODIUM 40 MG/0.4ML IJ SOSY
40.0000 mg | PREFILLED_SYRINGE | INTRAMUSCULAR | Status: DC
  Administered 2023-12-01: 40 mg via SUBCUTANEOUS
  Filled 2023-11-29 (×2): qty 0.4

## 2023-11-29 MED ORDER — ONDANSETRON HCL 4 MG PO TABS
4.0000 mg | ORAL_TABLET | Freq: Four times a day (QID) | ORAL | Status: DC | PRN
  Administered 2023-11-30: 4 mg via ORAL
  Filled 2023-11-29 (×2): qty 1

## 2023-11-29 MED ORDER — ONDANSETRON HCL 4 MG/2ML IJ SOLN
4.0000 mg | Freq: Once | INTRAMUSCULAR | Status: AC | PRN
  Administered 2023-11-29: 4 mg via INTRAVENOUS
  Filled 2023-11-29: qty 2

## 2023-11-29 MED ORDER — HYDROMORPHONE HCL 1 MG/ML IJ SOLN
0.5000 mg | INTRAMUSCULAR | Status: DC | PRN
  Administered 2023-11-29 – 2023-12-01 (×5): 0.5 mg via INTRAVENOUS
  Filled 2023-11-29 (×5): qty 0.5

## 2023-11-29 MED ORDER — ACETAMINOPHEN 650 MG RE SUPP: 650.0000 mg | Freq: Four times a day (QID) | RECTAL | Status: DC | PRN

## 2023-11-29 MED ORDER — ALPRAZOLAM 0.5 MG PO TABS
1.0000 mg | ORAL_TABLET | Freq: Two times a day (BID) | ORAL | Status: DC | PRN
  Administered 2023-11-29: 1 mg via ORAL
  Filled 2023-11-29: qty 2

## 2023-11-29 MED ORDER — CHLORHEXIDINE GLUCONATE CLOTH 2 % EX PADS
6.0000 | MEDICATED_PAD | Freq: Every day | CUTANEOUS | Status: DC
  Administered 2023-11-30 – 2023-12-01 (×3): 6 via TOPICAL

## 2023-11-29 MED ORDER — FLUOXETINE HCL 20 MG PO CAPS
80.0000 mg | ORAL_CAPSULE | Freq: Every day | ORAL | Status: DC
  Administered 2023-11-29 – 2023-11-30 (×2): 80 mg via ORAL
  Filled 2023-11-29 (×2): qty 4

## 2023-11-29 MED ORDER — ONDANSETRON HCL 4 MG/2ML IJ SOLN
4.0000 mg | Freq: Four times a day (QID) | INTRAMUSCULAR | Status: DC | PRN
  Administered 2023-11-29 – 2023-12-01 (×3): 4 mg via INTRAVENOUS
  Filled 2023-11-29 (×3): qty 2

## 2023-11-29 MED ORDER — HYDROMORPHONE HCL 1 MG/ML IJ SOLN
1.0000 mg | Freq: Once | INTRAMUSCULAR | Status: AC
  Administered 2023-11-29: 1 mg via INTRAVENOUS
  Filled 2023-11-29: qty 1

## 2023-11-29 MED ORDER — IOHEXOL 350 MG/ML SOLN
80.0000 mL | Freq: Once | INTRAVENOUS | Status: AC | PRN
  Administered 2023-11-29: 80 mL via INTRAVENOUS

## 2023-11-29 MED ORDER — CARVEDILOL PHOSPHATE ER 10 MG PO CP24
40.0000 mg | ORAL_CAPSULE | Freq: Every day | ORAL | Status: DC
  Administered 2023-12-01: 40 mg via ORAL
  Filled 2023-11-29 (×6): qty 4

## 2023-11-29 MED ORDER — ALBUTEROL SULFATE (2.5 MG/3ML) 0.083% IN NEBU: 2.5000 mg | INHALATION_SOLUTION | RESPIRATORY_TRACT | Status: DC | PRN

## 2023-11-29 MED ORDER — POTASSIUM CHLORIDE IN NACL 40-0.9 MEQ/L-% IV SOLN
INTRAVENOUS | Status: DC
  Filled 2023-11-29 (×2): qty 1000

## 2023-11-29 MED ORDER — LABETALOL HCL 5 MG/ML IV SOLN
10.0000 mg | INTRAVENOUS | Status: DC | PRN
  Administered 2023-11-29 – 2023-11-30 (×3): 10 mg via INTRAVENOUS
  Filled 2023-11-29 (×3): qty 4

## 2023-11-29 MED ORDER — ONDANSETRON HCL 4 MG/2ML IJ SOLN
4.0000 mg | Freq: Once | INTRAMUSCULAR | Status: AC
  Administered 2023-11-29: 4 mg via INTRAVENOUS
  Filled 2023-11-29: qty 2

## 2023-11-29 MED ORDER — INSULIN ASPART 100 UNIT/ML IJ SOLN
0.0000 [IU] | Freq: Four times a day (QID) | INTRAMUSCULAR | Status: DC
  Filled 2023-11-29: qty 1

## 2023-11-29 MED ORDER — LACTATED RINGERS IV BOLUS
1000.0000 mL | Freq: Once | INTRAVENOUS | Status: AC
  Administered 2023-11-29: 1000 mL via INTRAVENOUS

## 2023-11-29 MED ORDER — POLYETHYLENE GLYCOL 3350 17 G PO PACK: 17.0000 g | PACK | Freq: Every day | ORAL | Status: DC | PRN

## 2023-11-29 MED ORDER — ACETAMINOPHEN 325 MG PO TABS
650.0000 mg | ORAL_TABLET | Freq: Four times a day (QID) | ORAL | Status: DC | PRN
  Filled 2023-11-29: qty 2

## 2023-11-29 NOTE — H&P (Signed)
 History and Physical    LASHANTI CHAMBLESS FMW:[id number] DOB: 12/01/1950 DOA: 11/29/2023  PCP: Lazoff, Shawn P, DO   Patient coming from: Home  I have personally briefly reviewed patient's old medical records in [facility]  Chief Complaint: Vomiting, abdominal pain  HPI: PERL FOLMAR is a 73 y.o. female with medical history significant for hypertension, diabetes mellitus, CKD, stroke, gout. Patient presented to the ED with complaints of generalized abdominal pain and vomiting.  She reports intermittent episodes over the past 3 weeks.  She reports for the past few days she has not been able to keep anything down.  Reports initial diarrhea, but none in the past 2 days.  She denies use of marijuana.  ED Course: 298.  Heart rate 78-84.  Respiratory 12-16.  Blood pressure systolic 178-195.  O2 sats greater than 99% on room air. WBC 8.8.  Lactic acid 3.  Potassium 3.3. CTA AP W C-  A 3 x 2.2 cm centrally hypodense left upper quadrant lesion that abuts the left hepatic lobe and may be part of the liver-new from 2023.  Follow-up MRI liver protocol recommended preferably as outpatient. 1 L bolus given.  Zofran  given x 2. With persistent vomiting, and lactic acidosis, hospitalist was called to admit.  Review of Systems: As per HPI all other systems reviewed and negative.  Past Medical History:  Diagnosis Date   Anxiety    Asthma    Caregiver stress 07/04/2011   Chronic pain syndrome    Degenerative disc disease, cervical    Depression    Dyspnea    due to hiatal hernia   Essential hypertension 1998   GERD (gastroesophageal reflux disease)    Gout    Hiatal hernia    History of pancreatitis 06/08/2005   History of stroke    September 2020   N&V (nausea and vomiting) 04/13/2021   Pelvic mass    Pneumonia    x 2   PONV (postoperative nausea and vomiting)    Renal insufficiency    Seasonal allergies    Sinusitis, chronic    Stroke (HCC)    Transfusion history    Type 2  diabetes mellitus (HCC) 1998    Past Surgical History:  Procedure Laterality Date   APPENDECTOMY     ARM DEBRIDEMENT Left    debridement of axilla spider bite   BALLOON DILATION N/A 06/30/2021   Procedure: BALLOON DILATION;  Surgeon: Cindie Carlin POUR, DO;  [facility];  Service: Endoscopy;  Laterality: N/A;   BALLOON DILATION N/A 01/18/2022   Procedure: BALLOON DILATION;  Surgeon: Cindie Carlin POUR, DO;  [facility];  Service: Endoscopy;  Laterality: N/A;   BALLOON DILATION N/A 11/13/2022   Procedure: BALLOON DILATION;  Surgeon: Cindie Carlin POUR, DO;  [facility];  Service: Endoscopy;  Laterality: N/A;   BIOPSY  06/30/2021   Procedure: BIOPSY;  Surgeon: Cindie Carlin POUR, DO;  [facility];  Service: Endoscopy;;   BIOPSY  01/18/2022   Procedure: BIOPSY;  Surgeon: Cindie Carlin POUR, DO;  [facility];  Service: Endoscopy;;   BIOPSY  11/13/2022   Procedure: BIOPSY;  Surgeon: Cindie Carlin POUR, DO;  [facility];  Service: Endoscopy;;   BREAST SURGERY     breast cyst x2 -last '12   CATARACT EXTRACTION W/PHACO Left 11/25/2021   Procedure: CATARACT EXTRACTION PHACO AND INTRAOCULAR LENS PLACEMENT (IOC);  Surgeon: Harrie Agent, MD;  [facility]: [facility];  Service:  Ophthalmology;  Laterality: Left;  CDE 4.11   CATARACT EXTRACTION W/PHACO Right 01/06/2022   Procedure: CATARACT EXTRACTION PHACO AND INTRAOCULAR LENS PLACEMENT (IOC);  Surgeon: Harrie Agent, MD;  Location: [facility];  Service: Ophthalmology;  Laterality: Right;  CDE 4.97   CHOLECYSTECTOMY N/A 08/11/2015   Procedure: LAPAROSCOPIC CHOLECYSTECTOMY;  Surgeon: Herlene Righter Kinsinger, MD;  Location: [facility];  Service: General;  Laterality: N/A;   COLONOSCOPY WITH PROPOFOL  N/A 11/13/2022   Procedure: COLONOSCOPY WITH PROPOFOL ;  Surgeon: Cindie Carlin POUR, DO;  [facility];  Service: Endoscopy;  Laterality: N/A;  8:30 am, asa 3   ESOPHAGOGASTRODUODENOSCOPY N/A 07/17/2023    Procedure: EGD (ESOPHAGOGASTRODUODENOSCOPY);  Surgeon: Stevie, Herlene Righter, MD;  [facility];  Service: General;  Laterality: N/A;   ESOPHAGOGASTRODUODENOSCOPY (EGD) WITH PROPOFOL  N/A 06/30/2021   Surgeon: Cindie Carlin POUR, DO;   Moderate size hiatal hernia, moderate Schatzki's ring dilated, gastritis biopsied, normal examined duodenum.  Pathology with gastric antral and oxyntic mucosa with nonspecific reactive gastropathy, negative for H. pylori.   ESOPHAGOGASTRODUODENOSCOPY (EGD) WITH PROPOFOL  N/A 01/18/2022   Procedure: ESOPHAGOGASTRODUODENOSCOPY (EGD) WITH PROPOFOL ;  Surgeon: Cindie Carlin POUR, DO;  [facility];  Service: Endoscopy;  Laterality: N/A;  1:15 pm   ESOPHAGOGASTRODUODENOSCOPY (EGD) WITH PROPOFOL  N/A 11/13/2022   Procedure: ESOPHAGOGASTRODUODENOSCOPY (EGD) WITH PROPOFOL ;  Surgeon: Cindie Carlin POUR, DO;  [facility];  Service: Endoscopy;  Laterality: N/A;   ESOPHAGOGASTRODUODENOSCOPY (EGD) WITH PROPOFOL  N/A 03/13/2023   Procedure: ESOPHAGOGASTRODUODENOSCOPY (EGD) WITH PROPOFOL ;  Surgeon: Eartha Angelia Sieving, MD;  [facility];  Service: Gastroenterology;  Laterality: N/A;  945am, asa 3   LAPAROSCOPIC INSERTION GASTROSTOMY TUBE N/A 07/17/2023   Procedure: INSERTION, GASTROSTOMY TUBE, PERCUTANEOUS;  Surgeon: Kinsinger, Herlene Righter, MD;  [facility];  Service: General;  Laterality: N/A;   POLYPECTOMY  11/13/2022   Procedure: POLYPECTOMY;  Surgeon: Cindie Carlin POUR, DO;  [facility];  Service: Endoscopy;;   SAVORY DILATION N/A 03/13/2023   Procedure: HARLEY DILATION;  Surgeon: Eartha Angelia Sieving, MD;  [facility];  Service: Gastroenterology;  Laterality: N/A;   TONSILLECTOMY     TONSILLECTOMY AND ADENOIDECTOMY     TUBAL LIGATION     post partum   XI ROBOTIC ASSISTED HIATAL HERNIA REPAIR N/A 07/17/2023   Procedure: REPAIR, HERNIA, HIATAL, ROBOT-ASSISTED;  Surgeon: Kinsinger, Herlene Righter, MD;  Location: [facility];   Service: General;  Laterality: N/A;     reports that she has never smoked. She has never been exposed to tobacco smoke. She has never used smokeless tobacco. She reports current alcohol  use. She reports that she does not use drugs.  Allergies  Allergen Reactions   Pregabalin Other (See Comments)    Makes pt out of it   Amlodipine Besylate Other (See Comments)    Reaction not cited   Atorvastatin Other (See Comments)    Muscle/joint pain especially hip pain   Clopidogrel Other (See Comments)    Reaction not recalled   Ezetimibe -Simvastatin Other (See Comments)    Muscle/joint pain especially hip pain   Losartan  Potassium-Hctz Nausea Only and Other (See Comments)    Felt fatigued too   Losartan  Potassium-Hctz Other (See Comments)    Made the hips hurt   Morphine Other (See Comments)    Migraine    Niacin Other (See Comments)    Flushing    Rosuvastatin Other (See Comments)    Muscle/joint pain especially hip pain   Semaglutide Nausea Only  Statins     Hip pain   Toprol Xl [Metoprolol Succinate] Other (See Comments)    Makes her feel weak; fatigued   Valsartan Other (See Comments)    Reaction not cited   Aspirin Hives   Diazepam  Anxiety and Other (See Comments)    Had reverse effect on patient    Latex Rash    Family History  Problem Relation Age of Onset   Hypertension Mother    Depression Mother    Stomach cancer Mother 9   Hypertension Brother    Pancreatic cancer Brother    Diabetes Maternal Grandmother    Hypertension Maternal Grandmother    Breast cancer Maternal Grandmother    Heart attack Maternal Grandmother    Diabetes Maternal Aunt    Breast cancer Maternal Aunt    Diabetes Maternal Uncle    Hypertension Maternal Uncle    Hypertension Maternal Uncle    Stroke Other    Colon cancer Neg Hx     Acceptable: Family history reviewed and not pertinent (If you reviewed it)  Prior to Admission medications   Medication Sig Start Date End Date  Taking? Authorizing Provider  ADVAIR HFA 115-21 MCG/ACT inhaler INHALE 2 PUFFS INTO LUNGS TWICE DAILY Patient taking differently: Inhale 2 puffs into the lungs 2 (two) times daily as needed (for shortness of breath or wheezing- flares). 04/05/17   Smith, Kristi M, MD  albuterol  (PROVENTIL ) (2.5 MG/3ML) 0.083% nebulizer solution USE ONE VIAL IN NEBULIZER EVERY 6 HOURS AS NEEDED FOR WHEEZING OR SHORTNESS OF BREATH 09/25/15   Claudene Rayfield HERO, MD  albuterol  (VENTOLIN  HFA) 108 (90 Base) MCG/ACT inhaler INHALE ONE TO TWO PUFFS BY MOUTH EVERY 6 HOURS AS NEEDED FOR WHEEZING OR SHORTNESS OF BREATH (OR PERSISTENT COUGHING) 01/18/16   Claudene Rayfield HERO, MD  Alcohol  Swabs  PADS Use to check blood sugar daily. E11.9 04/07/15   Claudene Rayfield HERO, MD  ALPRAZolam  (XANAX ) 1 MG tablet Take 1 mg by mouth in the morning and at bedtime. 02/28/21   [provider]  ARIPiprazole  (ABILIFY ) 15 MG tablet Take 15 mg by mouth at bedtime.    [provider]  Ascorbic Acid (VITAMIN C PO) Take 1,000 mg by mouth in the morning.    [provider]  blood glucose meter kit and supplies KIT Dispense based on patient and insurance preference. Use once a day.  DX: E11.9. 12/31/14   Claudene Rayfield HERO, MD  blood glucose meter kit and supplies Dispense based on patient and insurance preference. Use up to four times daily as directed. (FOR ICD-10 E11.9). 03/31/15   Claudene Rayfield HERO, MD  Blood Glucose Monitoring Suppl (FREESTYLE LITE) DEVI  01/12/15   [provider]  carvedilol  (COREG  CR) 40 MG 24 hr capsule Take 1 capsule (40 mg total) by mouth daily. Patient not taking: Reported on 07/17/2023 07/04/21   Mona Vinie BROCKS, MD  Cholecalciferol (VITAMIN D3) 50 MCG (2000 UT) TABS Take 2,000 Units by mouth daily.    [provider]  cyanocobalamin (VITAMIN B12) 1000 MCG tablet Take 1,000 mcg by mouth daily.    [provider]  diphenhydrAMINE  (BENADRYL ) 50 MG capsule Take 50 mg by mouth at bedtime.     [provider]  EPINEPHrine  (EPIPEN  2-PAK) 0.3 mg/0.3 mL IJ SOAJ injection Inject 0.3 mLs (0.3 mg total) into the muscle once. Patient taking differently: Inject 0.3 mg into the muscle once as needed for anaphylaxis. 01/07/15   Smith, Kristi M, MD  estradiol  (VIVELLE -DOT)  0.05 MG/24HR patch Place 1 patch onto the skin 2 (two) times a week. 06/03/21   [provider]  FLUoxetine  (PROZAC ) 40 MG capsule Take 80 mg by mouth at bedtime.    [provider]  fluticasone  (FLONASE ) 50 MCG/ACT nasal spray Place 2 sprays into both nostrils daily as needed for allergies or rhinitis.    [provider]  glucose blood test strip Test blood sugar daily.  DX: E11.9 04/06/15   Claudene Rayfield HERO, MD  glucose monitoring kit (FREESTYLE) monitoring kit Test blood sugar daily. DX: E11.9 01/12/15   Claudene Rayfield HERO, MD  Lancet Devices (LANCING DEVICE) MISC Use to test blood sugar daily. E11.9 04/07/15   Claudene Rayfield HERO, MD  Lancets MISC Test blood sugar daily. DX: E11.9 04/06/15   Claudene Rayfield HERO, MD  magnesium  oxide (MAG-OX) 400 MG tablet Take 400 mg by mouth See admin instructions. Take 400 mg by mouth at 10 AM    [provider]  meclizine  (ANTIVERT ) 25 MG tablet Take 25 mg by mouth 3 (three) times daily as needed for dizziness or nausea. 06/28/20   [provider]  medroxyPROGESTERone  (PROVERA ) 2.5 MG tablet TAKE ONE TABLET BY MOUTH ONCE DAILY Patient taking differently: Take 2.5 mg by mouth at bedtime. 10/14/15   Claudene Rayfield HERO, MD  Menthol, Topical Analgesic, (BIOFREEZE) 4 % GEL Apply 1 application  topically as needed (pain- affected areas).    [provider]  Multiple Vitamin (MULTIVITAMIN WITH MINERALS) TABS tablet Take 1 tablet by mouth daily after breakfast.    [provider]  naloxone (NARCAN) nasal spray 4 mg/0.1 mL Place 1 spray into the nose See admin instructions. Instill 1 spray into the nose every 3 minutes until awake of EMS arrives- for an  accidental opioid overdose 07/13/23   [provider]  olmesartan  (BENICAR ) 40 MG tablet Take 1 tablet (40 mg total) by mouth daily. 07/18/23   Kinsinger, Herlene Righter, MD  ondansetron  (ZOFRAN -ODT) 4 MG disintegrating tablet Take 4 mg by mouth every 8 (eight) hours as needed for vomiting or nausea (dissolve orally).    [provider]  oxyCODONE  (OXY IR/ROXICODONE ) 5 MG immediate release tablet Take 1 tablet (5 mg total) by mouth every 6 (six) hours as needed for severe pain (pain score 7-10). 07/18/23   Kinsinger, Herlene Righter, MD  oxyCODONE -acetaminophen  (PERCOCET) 10-325 MG tablet Take 1 tablet by mouth See admin instructions. Take 1 tablet by mouth in the morning, at noontime, and at bedtime- may take 1 additional tablet once a day as needed for pain 03/29/22   [provider]  pantoprazole  (PROTONIX ) 40 MG tablet TAKE 1 TABLET (40 MG TOTAL) BY MOUTH TWICE A DAY BEFORE MEALS 01/26/23   Rudy Josette RAMAN, PA-C  Propylene Glycol (SYSTANE BALANCE) 0.6 % SOLN Place 1 drop into both eyes in the morning, at noon, and at bedtime.    [provider]  ZYRTEC  ALLERGY 10 MG tablet Take 10 mg by mouth at bedtime.    [provider]    Physical Exam: Vitals:   11/29/23 1638 11/29/23 1639 11/29/23 1700 11/29/23 1730  BP:  (!) 180/111 (!) 185/100 (!) 178/78  Pulse:  84 80 78  Resp:  16 14 12   Temp:  98 F (36.7 C)    TempSrc:  Oral    SpO2:  100% 100% 99%  Weight: 63.5 kg     Height: 5' 4 (1.626 m)       Constitutional: NAD,  calm, comfortable Vitals:   11/29/23 1638 11/29/23 1639 11/29/23 1700 11/29/23 1730  BP:  (!) 180/111 (!) 185/100 (!) 178/78  Pulse:  84 80 78  Resp:  16 14 12   Temp:  98 F (36.7 C)    TempSrc:  Oral    SpO2:  100% 100% 99%  Weight: 63.5 kg     Height: 5' 4 (1.626 m)      Eyes: PERRL, lids and conjunctivae normal ENMT: Mucous membranes are mildly dry. Neck: normal, supple, no masses, no thyromegaly Respiratory: clear to  auscultation bilaterally, no wheezing, no crackles. Normal respiratory effort. No accessory muscle use.  Cardiovascular: Regular rate and rhythm, no murmurs / rubs / gallops. No extremity edema.   Abdomen: no tenderness, no masses palpated. No hepatosplenomegaly. Bowel sounds positive.  Musculoskeletal: no clubbing / cyanosis. No joint deformity upper and lower extremities.   Skin: no rashes, lesions, ulcers. No induration Neurologic: No facial asymmetry, moving extremities spontaneously, speech fluent. Psychiatric: Normal judgment and insight. Alert and oriented x 3. Normal mood.   Labs on Admission: I have personally reviewed following labs and imaging studies  CBC: Recent Labs  Lab 11/29/23 1700  WBC 8.8  HGB 13.6  HCT 40.6  MCV 84.4  PLT 251   Basic Metabolic Panel: Recent Labs  Lab 11/29/23 1700  NA 135  K 3.3*  CL 101  CO2 17*  GLUCOSE 172*  BUN 10  CREATININE 1.23*  CALCIUM 8.8*   GFR: Estimated Creatinine Clearance: 34.7 mL/min (A) (by C-G formula based on SCr of 1.23 mg/dL (H)). Liver Function Tests: Recent Labs  Lab 11/29/23 1700  AST 87*  ALT 26  ALKPHOS 203*  BILITOT 1.5*  PROT 6.8  ALBUMIN 3.4*   Recent Labs  Lab 11/29/23 1700  LIPASE 55*   Urine analysis:    Component Value Date/Time   COLORURINE STRAW (A) 01/17/2023 1217   APPEARANCEUR CLEAR 01/17/2023 1217   LABSPEC 1.010 01/17/2023 1217   PHURINE 8.0 01/17/2023 1217   GLUCOSEU NEGATIVE 01/17/2023 1217   HGBUR NEGATIVE 01/17/2023 1217   HGBUR negative 07/26/2007 0900   BILIRUBINUR NEGATIVE 01/17/2023 1217   BILIRUBINUR negative 04/18/2016 1319   BILIRUBINUR neg 04/24/2014 1314   KETONESUR 5 (A) 01/17/2023 1217   PROTEINUR 100 (A) 01/17/2023 1217   UROBILINOGEN 0.2 04/18/2016 1319   UROBILINOGEN 0.2 08/21/2008 1321   NITRITE NEGATIVE 01/17/2023 1217   LEUKOCYTESUR NEGATIVE 01/17/2023 1217    Radiological Exams on Admission: CT Angio Abd/Pel W and/or Wo Contrast Result Date:  11/29/2023 CLINICAL DATA:  Mesenteric ischemia, acute Pt with abd pain for awhile, pt states worse over the last month. + N/V, diarrhea yesterday and today. EXAM: CTA ABDOMEN AND PELVIS WITHOUT AND WITH CONTRAST TECHNIQUE: Multidetector CT imaging of the abdomen and pelvis was performed using the standard protocol during bolus administration of intravenous contrast. Multiplanar reconstructed images and MIPs were obtained and reviewed to evaluate the vascular anatomy. RADIATION DOSE REDUCTION: This exam was performed according to the departmental dose-optimization program which includes automated exposure control, adjustment of the mA and/or kV according to patient size and/or use of iterative reconstruction technique. CONTRAST:  80mL OMNIPAQUE  IOHEXOL  350 MG/ML SOLN COMPARISON:  CT abdomen pelvis 11/25/2023, CT abdomen pelvis 11/10/2021, CT abdomen pelvis 01/16/2020 FINDINGS: VASCULAR Aorta: Mild atherosclerotic plaque. Normal caliber aorta without aneurysm, dissection, vasculitis or significant stenosis. Celiac: Patent without evidence of aneurysm, dissection, vasculitis or significant stenosis. SMA: Patent without evidence of aneurysm, dissection, vasculitis or significant stenosis. Renals:  Mild atherosclerotic plaque. Both renal arteries are patent without evidence of aneurysm, dissection, vasculitis, fibromuscular dysplasia or significant stenosis. IMA: Patent without evidence of aneurysm, dissection, vasculitis or significant stenosis. Inflow: Patent without evidence of aneurysm, dissection, vasculitis or significant stenosis. Proximal Outflow: Bilateral common femoral and visualized portions of the superficial and profunda femoral arteries are patent without evidence of aneurysm, dissection, vasculitis or significant stenosis. Veins: No portal mesenteric venous gas. The main portal, splenic, superior mesenteric veins are patent. Review of the MIP images confirms the above findings. NON-VASCULAR Lower chest:  No acute abnormality. Hepatobiliary: There is a 3 x 2.2 cm centrally hypodense left upper quadrant lesion that abuts the left hepatic lobe and may be part of the liver-new from 2023 (9:80, 4:42). No gallstones, gallbladder wall thickening, or pericholecystic fluid. No biliary dilatation. Pancreas: Diffusely atrophic. No focal lesion. Otherwise normal pancreatic contour. No surrounding inflammatory changes. No main pancreatic ductal dilatation. Spleen: Normal in size without focal abnormality. Adrenals/Urinary Tract: No adrenal nodule bilaterally. Bilateral kidneys enhance symmetrically. No hydronephrosis. No hydroureter. The urinary bladder is unremarkable. Stomach/Bowel: Stomach is within normal limits. No evidence of bowel wall thickening or dilatation. A The appendix is not definitely identified with no inflammatory changes in the right lower quadrant to suggest acute appendicitis. Lymphatic: No lymphadenopathy. Reproductive: Uterus and bilateral adnexa are unremarkable. Other: No intraperitoneal free fluid. No intraperitoneal free gas. No organized fluid collection. Musculoskeletal: Tiny fat containing left inguinal hernia. No suspicious lytic or blastic osseous lesions. No acute displaced fracture. Thoracolumbar dextroscoliosis. IMPRESSION: VASCULAR 1. Aortic Atherosclerosis (ICD10-I70.0)-mild. NON-VASCULAR 1. A 3 x 2.2 cm centrally hypodense left upper quadrant lesion that abuts the left hepatic lobe and may be part of the liver-new from 2023. When the patient is clinically stable and able to follow directions and hold their breath (preferably as an outpatient) further evaluation with dedicated MRI liver protocol should be considered. 2. Tiny fat containing left inguinal hernia. Electronically Signed   By: Morgane  Naveau M.D.   On: 11/29/2023 19:46   EKG: None   Assessment/Plan Principal Problem:   Intractable vomiting Active Problems:   Abdominal pain, chronic, epigastric   Liver lesion   DM II  (diabetes mellitus, type II), controlled (HCC)   Anxiety state   Essential hypertension   Asthma  Assessment and Plan: * Intractable vomiting Intractable vomiting with abdominal pain.  Elevated ALP 203, mildly elevated AST 87.  Denies marijuana use.  Afebrile.  WBC 8.8.  Lactic acidosis of 3- ? Dehydration.  Bicarb at 17, currently elevated anion gap of 17. - CTA A/P-no explanation for symptoms, but shows a liver lesion.  No gallstones.   -  Last endoscopy 03/2023 for dysphagia -esophagus was dilated, 7 cm hiatal hernia, nonbleeding duodenal diverticulum -Zofran  as needed -IV Dilaudid  0.5 every 4 hourly as needed -UDS -If persistent symptoms may need GI consult - 2 l bolus, cont N/s + 40cl 75cc/hr - Trend lactic acid - Clear liq diet - Patient reports she has an appointment here tomorrow for procedure. The closest is a radiology appt- 7/29 at [facility].  Liver lesion Finding on CTA abdomen pelvis-  A 3 x 2.2 cm centrally hypodense left upper quadrant lesion that abuts the left hepatic lobe and may be part of the liver-new from 2023. When the patient is clinically stable and able to follow directions and hold their breath (preferably as an outpatient) further evaluation with dedicated MRI liver protocol should be considered.  Essential hypertension Elevated  - resume carvedilol  -  Hold telmisartan for contrast exposure -As needed labetalol  10 mg for systolic > 180  Anxiety state Resume fluoxetine , she is also on Xanax  twice daily- resume  DM II (diabetes mellitus, type II), controlled (HCC) Not on medication. - HgbA1c - SSI- S.  Hypokalemia, potassium 3.3.  Replete.  DVT prophylaxis: Lovenox  Code Status:  FULL Family Communication: None at bedside presently. Disposition Plan: ~ 2 days Consults called: None Admission status:  Obs Tele    Author: Tully FORBES Carwin, MD 11/29/2023 9:41 PM  For on call review [email or web address].

## 2023-11-29 NOTE — ED Provider Notes (Signed)
 [place] [facility] AT [facility] Provider Note   CSN: [id number] Arrival date & time: 11/29/23  1613     Patient presents with: Abdominal Pain   Dana Romero is a 73 y.o. female.  She is here with a complaint of ongoing abdominal pain for weeks to months.  It is associated with decreased appetite.  She endorses vomiting daily and has had a few weeks of diarrhea.  She has had hiatal hernia repair after having longstanding dysphagia.  She said that seemed to help for a bit but now things are worsening.  History of cholecystectomy.  She tells me she supposed to be here for procedure tomorrow but she does not know what it is for.  She cannot tell me who her GI doctor is.  {Add pertinent medical, surgical, social history, OB history to [id number]} The history is provided by the patient and the spouse.  Abdominal Pain Pain location:  Generalized Pain quality: aching   Pain severity:  Severe Onset quality:  Gradual Duration:  5 weeks Timing:  Constant Progression:  Unchanged Relieved by:  Nothing Associated symptoms: diarrhea, nausea and vomiting   Associated symptoms: no constipation, no cough, no dysuria, no fever, no hematemesis and no hematochezia        Prior to Admission medications   Medication Sig Start Date End Date Taking? Authorizing Provider  ADVAIR HFA 115-21 MCG/ACT inhaler INHALE 2 PUFFS INTO LUNGS TWICE DAILY Patient taking differently: Inhale 2 puffs into the lungs 2 (two) times daily as needed (for shortness of breath or wheezing- flares). 04/05/17   Smith, Kristi M, MD  albuterol  (PROVENTIL ) (2.5 MG/3ML) 0.083% nebulizer solution USE ONE VIAL IN NEBULIZER EVERY 6 HOURS AS NEEDED FOR WHEEZING OR SHORTNESS OF BREATH 09/25/15   Claudene Rayfield HERO, MD  albuterol  (VENTOLIN  HFA) 108 (90 Base) MCG/ACT inhaler INHALE ONE TO TWO PUFFS BY MOUTH EVERY 6 HOURS AS NEEDED FOR WHEEZING OR SHORTNESS OF BREATH (OR PERSISTENT COUGHING) 01/18/16   Claudene Rayfield HERO, MD   Alcohol  Swabs  PADS Use to check blood sugar daily. E11.9 04/07/15   Claudene Rayfield HERO, MD  ALPRAZolam  (XANAX ) 1 MG tablet Take 1 mg by mouth in the morning and at bedtime. 02/28/21   [provider]  ARIPiprazole  (ABILIFY ) 15 MG tablet Take 15 mg by mouth at bedtime.    [provider]  Ascorbic Acid (VITAMIN C PO) Take 1,000 mg by mouth in the morning.    [provider]  blood glucose meter kit and supplies KIT Dispense based on patient and insurance preference. Use once a day.  DX: E11.9. 12/31/14   Claudene Rayfield HERO, MD  blood glucose meter kit and supplies Dispense based on patient and insurance preference. Use up to four times daily as directed. (FOR ICD-10 E11.9). 03/31/15   Claudene Rayfield HERO, MD  Blood Glucose Monitoring Suppl (FREESTYLE LITE) DEVI  01/12/15   [provider]  carvedilol  (COREG  CR) 40 MG 24 hr capsule Take 1 capsule (40 mg total) by mouth daily. Patient not taking: Reported on 07/17/2023 07/04/21   Mona Vinie BROCKS, MD  Cholecalciferol (VITAMIN D3) 50 MCG (2000 UT) TABS Take 2,000 Units by mouth daily.    [provider]  cyanocobalamin (VITAMIN B12) 1000 MCG tablet Take 1,000 mcg by mouth daily.    [provider]  diphenhydrAMINE  (BENADRYL ) 50 MG capsule Take 50 mg by mouth at bedtime.    [provider]  EPINEPHrine  (EPIPEN  2-PAK) 0.3 mg/0.3 mL  IJ SOAJ injection Inject 0.3 mLs (0.3 mg total) into the muscle once. Patient taking differently: Inject 0.3 mg into the muscle once as needed for anaphylaxis. 01/07/15   Claudene Rayfield HERO, MD  estradiol  (VIVELLE -DOT) 0.05 MG/24HR patch Place 1 patch onto the skin 2 (two) times a week. 06/03/21   [provider]  FLUoxetine  (PROZAC ) 40 MG capsule Take 80 mg by mouth at bedtime.    [provider]  fluticasone  (FLONASE ) 50 MCG/ACT nasal spray Place 2 sprays into both nostrils daily as needed for allergies or rhinitis.    [provider]  glucose blood test  strip Test blood sugar daily.  DX: E11.9 04/06/15   Claudene Rayfield HERO, MD  glucose monitoring kit (FREESTYLE) monitoring kit Test blood sugar daily. DX: E11.9 01/12/15   Claudene Rayfield HERO, MD  Lancet Devices (LANCING DEVICE) MISC Use to test blood sugar daily. E11.9 04/07/15   Claudene Rayfield HERO, MD  Lancets MISC Test blood sugar daily. DX: E11.9 04/06/15   Claudene Rayfield HERO, MD  magnesium  oxide (MAG-OX) 400 MG tablet Take 400 mg by mouth See admin instructions. Take 400 mg by mouth at 10 AM    [provider]  meclizine  (ANTIVERT ) 25 MG tablet Take 25 mg by mouth 3 (three) times daily as needed for dizziness or nausea. 06/28/20   [provider]  medroxyPROGESTERone  (PROVERA ) 2.5 MG tablet TAKE ONE TABLET BY MOUTH ONCE DAILY Patient taking differently: Take 2.5 mg by mouth at bedtime. 10/14/15   Claudene Rayfield HERO, MD  Menthol, Topical Analgesic, (BIOFREEZE) 4 % GEL Apply 1 application  topically as needed (pain- affected areas).    [provider]  Multiple Vitamin (MULTIVITAMIN WITH MINERALS) TABS tablet Take 1 tablet by mouth daily after breakfast.    [provider]  naloxone (NARCAN) nasal spray 4 mg/0.1 mL Place 1 spray into the nose See admin instructions. Instill 1 spray into the nose every 3 minutes until awake of EMS arrives- for an accidental opioid overdose 07/13/23   [provider]  olmesartan  (BENICAR ) 40 MG tablet Take 1 tablet (40 mg total) by mouth daily. 07/18/23   Kinsinger, Herlene Righter, MD  ondansetron  (ZOFRAN -ODT) 4 MG disintegrating tablet Take 4 mg by mouth every 8 (eight) hours as needed for vomiting or nausea (dissolve orally).    [provider]  oxyCODONE  (OXY IR/ROXICODONE ) 5 MG immediate release tablet Take 1 tablet (5 mg total) by mouth every 6 (six) hours as needed for severe pain (pain score 7-10). 07/18/23   Kinsinger, Herlene Righter, MD  oxyCODONE -acetaminophen  (PERCOCET) 10-325 MG tablet Take 1 tablet by mouth See admin instructions. Take  1 tablet by mouth in the morning, at noontime, and at bedtime- may take 1 additional tablet once a day as needed for pain 03/29/22   [provider]  pantoprazole  (PROTONIX ) 40 MG tablet TAKE 1 TABLET (40 MG TOTAL) BY MOUTH TWICE A DAY BEFORE MEALS 01/26/23   Rudy Josette RAMAN, PA-C  Propylene Glycol (SYSTANE BALANCE) 0.6 % SOLN Place 1 drop into both eyes in the morning, at noon, and at bedtime.    [provider]  ZYRTEC  ALLERGY 10 MG tablet Take 10 mg by mouth at bedtime.    [provider]    Allergies: Pregabalin, Amlodipine besylate, Atorvastatin, Clopidogrel, Ezetimibe -simvastatin, Losartan  potassium-hctz, Losartan  potassium-hctz, Morphine, Niacin, Rosuvastatin, Semaglutide, Statins, Toprol xl [metoprolol succinate], Valsartan, Aspirin, Diazepam , and Latex    Review of Systems  Constitutional:  Negative for fever.  Respiratory:  Negative for cough.   Gastrointestinal:  Positive for abdominal pain, diarrhea, nausea and vomiting. Negative for constipation, hematemesis and hematochezia.  Genitourinary:  Negative for dysuria.    Updated Vital Signs BP (!) 185/100   Pulse 80   Temp 98 F (36.7 C) (Oral)   Resp 14   Ht 5' 4 (1.626 m)   Wt 63.5 kg   LMP 03/15/2014   SpO2 100%   BMI 24.03 kg/m   Physical Exam Vitals and nursing note reviewed.  Constitutional:      General: She is not in acute distress.    Appearance: Normal appearance. She is well-developed.  HENT:     Head: Normocephalic and atraumatic.  Eyes:     Conjunctiva/sclera: Conjunctivae normal.  Cardiovascular:     Rate and Rhythm: Normal rate and regular rhythm.     Heart sounds: No murmur heard. Pulmonary:     Effort: Pulmonary effort is normal. No respiratory distress.     Breath sounds: Normal breath sounds. No stridor. No wheezing.  Abdominal:     Palpations: Abdomen is soft.     Tenderness: There is generalized abdominal tenderness. There is no guarding or rebound.   Musculoskeletal:        General: No tenderness or deformity. Normal range of motion.     Cervical back: Neck supple.  Skin:    General: Skin is warm and dry.  Neurological:     General: No focal deficit present.     Mental Status: She is alert.     GCS: GCS eye subscore is 4. GCS verbal subscore is 5. GCS motor subscore is 6.     (all labs ordered are listed, but only abnormal results are displayed) Labs Reviewed  CBC  LIPASE, BLOOD  COMPREHENSIVE METABOLIC PANEL WITH GFR  URINALYSIS, ROUTINE W REFLEX MICROSCOPIC    EKG: None  Radiology: No results found.  {Document cardiac monitor, telemetry assessment procedure when appropriate:32947} Procedures   Medications Ordered in the ED  ondansetron  (ZOFRAN ) injection 4 mg (4 mg Intravenous Given 11/29/23 1709)      {Click here for ABCD2, HEART and other calculators REFRESH Note before signing:1}                              Medical Decision Making Amount and/or Complexity of Data Reviewed Labs: ordered.  Risk Prescription drug management.   This patient complains of ***; this involves an extensive number of treatment Options and is a complaint that carries with it a high risk of complications and morbidity. The differential includes ***  I ordered, reviewed and interpreted labs, which included *** I ordered medication *** and reviewed PMP when indicated. I ordered imaging studies which included *** and I independently    visualized and interpreted imaging which showed *** Additional history obtained from *** Previous records obtained and reviewed *** I consulted *** and discussed lab and imaging findings and discussed disposition.  Cardiac monitoring reviewed, *** Social determinants considered, *** Critical Interventions: ***  After the interventions stated above, I reevaluated the patient and found *** Admission and further testing considered, ***   {Document critical care time when appropriate  Document  review of labs and clinical decision tools ie CHADS2VASC2, etc  Document your independent review of radiology images and any outside records  Document your discussion with family members, caretakers and with consultants  Document social determinants of health affecting pt's care  Document  your decision making why or why not admission, treatments were needed:32947:::1}   Final diagnoses:  None    ED Discharge Orders     None

## 2023-11-29 NOTE — Assessment & Plan Note (Addendum)
 Intractable vomiting with abdominal pain.  Elevated ALP 203, mildly elevated AST 87.  Denies marijuana use.  Afebrile.  WBC 8.8.  Lactic acidosis of 3- ? Dehydration.  Bicarb at 17, currently elevated anion gap of 17. - CTA A/P-no explanation for symptoms, but shows a liver lesion.  No gallstones.   -  Last endoscopy 03/2023 for dysphagia -esophagus was dilated, 7 cm hiatal hernia, nonbleeding duodenal diverticulum -Zofran  as needed -IV Dilaudid  0.5 every 4 hourly as needed -UDS -If persistent symptoms may need GI consult - 2 l bolus, cont N/s + 40cl 75cc/hr - Trend lactic acid - Clear liq diet - Patient reports she has an appointment here tomorrow for procedure. The closest is a radiology appt- 7/29 at [facility].

## 2023-11-29 NOTE — ED Triage Notes (Signed)
 Pt with abd pain for awhile, pt states worse over the last month. + N/V, diarrhea yesterday and today.

## 2023-11-29 NOTE — ED Notes (Signed)
Per EDP ok to give water

## 2023-11-29 NOTE — Assessment & Plan Note (Signed)
 Finding on CTA abdomen pelvis-  A 3 x 2.2 cm centrally hypodense left upper quadrant lesion that abuts the left hepatic lobe and may be part of the liver-new from 2023. When the patient is clinically stable and able to follow directions and hold their breath (preferably as an outpatient) further evaluation with dedicated MRI liver protocol should be considered.

## 2023-11-29 NOTE — ED Notes (Signed)
Husband left

## 2023-11-29 NOTE — ED Notes (Signed)
 Hospitalist at bedside

## 2023-11-29 NOTE — ED Notes (Signed)
Called ac for ivf

## 2023-11-29 NOTE — Assessment & Plan Note (Signed)
 Not on medication. - HgbA1c - SSI- S.

## 2023-11-29 NOTE — ED Notes (Signed)
Ambulatory to restroom with standby assist.

## 2023-11-29 NOTE — Assessment & Plan Note (Addendum)
 Resume fluoxetine , she is also on Xanax  twice daily- resume

## 2023-11-29 NOTE — Assessment & Plan Note (Signed)
 Elevated  - resume carvedilol  -Hold telmisartan for contrast exposure -As needed labetalol  10 mg for systolic > 180

## 2023-11-29 NOTE — ED Notes (Signed)
 Patient transported to CT

## 2023-11-30 ENCOUNTER — Inpatient Hospital Stay (HOSPITAL_COMMUNITY)

## 2023-11-30 DIAGNOSIS — Z833 Family history of diabetes mellitus: Secondary | ICD-10-CM | POA: Diagnosis not present

## 2023-11-30 DIAGNOSIS — F411 Generalized anxiety disorder: Secondary | ICD-10-CM | POA: Diagnosis present

## 2023-11-30 DIAGNOSIS — I129 Hypertensive chronic kidney disease with stage 1 through stage 4 chronic kidney disease, or unspecified chronic kidney disease: Secondary | ICD-10-CM | POA: Diagnosis present

## 2023-11-30 DIAGNOSIS — E1122 Type 2 diabetes mellitus with diabetic chronic kidney disease: Secondary | ICD-10-CM | POA: Diagnosis present

## 2023-11-30 DIAGNOSIS — G894 Chronic pain syndrome: Secondary | ICD-10-CM | POA: Diagnosis present

## 2023-11-30 DIAGNOSIS — Z823 Family history of stroke: Secondary | ICD-10-CM | POA: Diagnosis not present

## 2023-11-30 DIAGNOSIS — D631 Anemia in chronic kidney disease: Secondary | ICD-10-CM | POA: Diagnosis present

## 2023-11-30 DIAGNOSIS — E876 Hypokalemia: Secondary | ICD-10-CM | POA: Diagnosis present

## 2023-11-30 DIAGNOSIS — E872 Acidosis, unspecified: Secondary | ICD-10-CM | POA: Diagnosis present

## 2023-11-30 DIAGNOSIS — E860 Dehydration: Secondary | ICD-10-CM | POA: Diagnosis present

## 2023-11-30 DIAGNOSIS — Z818 Family history of other mental and behavioral disorders: Secondary | ICD-10-CM | POA: Diagnosis not present

## 2023-11-30 DIAGNOSIS — J45909 Unspecified asthma, uncomplicated: Secondary | ICD-10-CM | POA: Diagnosis present

## 2023-11-30 DIAGNOSIS — R1110 Vomiting, unspecified: Secondary | ICD-10-CM | POA: Diagnosis not present

## 2023-11-30 DIAGNOSIS — Z803 Family history of malignant neoplasm of breast: Secondary | ICD-10-CM | POA: Diagnosis not present

## 2023-11-30 DIAGNOSIS — R1084 Generalized abdominal pain: Secondary | ICD-10-CM | POA: Diagnosis present

## 2023-11-30 DIAGNOSIS — K769 Liver disease, unspecified: Secondary | ICD-10-CM | POA: Diagnosis present

## 2023-11-30 DIAGNOSIS — K219 Gastro-esophageal reflux disease without esophagitis: Secondary | ICD-10-CM | POA: Diagnosis present

## 2023-11-30 DIAGNOSIS — R112 Nausea with vomiting, unspecified: Secondary | ICD-10-CM | POA: Diagnosis present

## 2023-11-30 DIAGNOSIS — Z800 Family history of malignant neoplasm of digestive organs: Secondary | ICD-10-CM | POA: Diagnosis not present

## 2023-11-30 DIAGNOSIS — Z8673 Personal history of transient ischemic attack (TIA), and cerebral infarction without residual deficits: Secondary | ICD-10-CM | POA: Diagnosis not present

## 2023-11-30 DIAGNOSIS — N1831 Chronic kidney disease, stage 3a: Secondary | ICD-10-CM | POA: Diagnosis present

## 2023-11-30 DIAGNOSIS — R1115 Cyclical vomiting syndrome unrelated to migraine: Secondary | ICD-10-CM | POA: Diagnosis present

## 2023-11-30 DIAGNOSIS — Z7951 Long term (current) use of inhaled steroids: Secondary | ICD-10-CM | POA: Diagnosis not present

## 2023-11-30 DIAGNOSIS — Z9049 Acquired absence of other specified parts of digestive tract: Secondary | ICD-10-CM | POA: Diagnosis not present

## 2023-11-30 DIAGNOSIS — M5030 Other cervical disc degeneration, unspecified cervical region: Secondary | ICD-10-CM | POA: Diagnosis present

## 2023-11-30 DIAGNOSIS — M109 Gout, unspecified: Secondary | ICD-10-CM | POA: Diagnosis present

## 2023-11-30 DIAGNOSIS — Z8249 Family history of ischemic heart disease and other diseases of the circulatory system: Secondary | ICD-10-CM | POA: Diagnosis not present

## 2023-11-30 LAB — CBC
HCT: 34.6 % — ABNORMAL LOW (ref 36.0–46.0)
Hemoglobin: 11.2 g/dL — ABNORMAL LOW (ref 12.0–15.0)
MCH: 28.1 pg (ref 26.0–34.0)
MCHC: 32.4 g/dL (ref 30.0–36.0)
MCV: 86.7 fL (ref 80.0–100.0)
Platelets: 208 K/uL (ref 150–400)
RBC: 3.99 MIL/uL (ref 3.87–5.11)
RDW: 15.4 % (ref 11.5–15.5)
WBC: 9.2 K/uL (ref 4.0–10.5)
nRBC: 0 % (ref 0.0–0.2)

## 2023-11-30 LAB — HEMOGLOBIN A1C
Hgb A1c MFr Bld: 6.7 % — ABNORMAL HIGH (ref 4.8–5.6)
Mean Plasma Glucose: 145.59 mg/dL

## 2023-11-30 LAB — GLUCOSE, CAPILLARY
Glucose-Capillary: 142 mg/dL — ABNORMAL HIGH (ref 70–99)
Glucose-Capillary: 170 mg/dL — ABNORMAL HIGH (ref 70–99)
Glucose-Capillary: 201 mg/dL — ABNORMAL HIGH (ref 70–99)
Glucose-Capillary: 176 mg/dL — ABNORMAL HIGH (ref 70–99)

## 2023-11-30 LAB — BASIC METABOLIC PANEL WITH GFR
Anion gap: 9 (ref 5–15)
BUN: 8 mg/dL (ref 8–23)
CO2: 19 mmol/L — ABNORMAL LOW (ref 22–32)
Calcium: 7.7 mg/dL — ABNORMAL LOW (ref 8.9–10.3)
Chloride: 107 mmol/L (ref 98–111)
Creatinine, Ser: 1.2 mg/dL — ABNORMAL HIGH (ref 0.44–1.00)
GFR, Estimated: 48 mL/min — ABNORMAL LOW (ref >60)
Glucose, Bld: 197 mg/dL — ABNORMAL HIGH (ref 70–99)
Potassium: 3.2 mmol/L — ABNORMAL LOW (ref 3.5–5.1)
Sodium: 135 mmol/L (ref 135–145)

## 2023-11-30 LAB — MRSA NEXT GEN BY PCR, NASAL: MRSA by PCR Next Gen: DETECTED — AB

## 2023-11-30 MED ORDER — MUPIROCIN 2 % EX OINT
1.0000 | TOPICAL_OINTMENT | Freq: Two times a day (BID) | CUTANEOUS | Status: DC
  Administered 2023-11-30 – 2023-12-01 (×2): 1 via NASAL
  Filled 2023-11-30 (×2): qty 22

## 2023-11-30 MED ORDER — INSULIN ASPART 100 UNIT/ML IJ SOLN
0.0000 [IU] | Freq: Three times a day (TID) | INTRAMUSCULAR | Status: DC
  Administered 2023-12-01: 3 [IU] via SUBCUTANEOUS

## 2023-11-30 MED ORDER — POTASSIUM CHLORIDE 10 MEQ/100ML IV SOLN
10.0000 meq | INTRAVENOUS | Status: AC
  Administered 2023-11-30 (×3): 10 meq via INTRAVENOUS
  Filled 2023-11-30: qty 100

## 2023-11-30 MED ORDER — LORAZEPAM 2 MG/ML IJ SOLN
1.0000 mg | Freq: Two times a day (BID) | INTRAMUSCULAR | Status: DC | PRN
  Administered 2023-11-30: 1 mg via INTRAVENOUS
  Filled 2023-11-30: qty 1

## 2023-11-30 MED ORDER — BOOST / RESOURCE BREEZE PO LIQD CUSTOM
1.0000 | Freq: Three times a day (TID) | ORAL | Status: DC
  Administered 2023-11-30 – 2023-12-01 (×3): 1 via ORAL

## 2023-11-30 MED ORDER — POTASSIUM CHLORIDE IN NACL 40-0.9 MEQ/L-% IV SOLN: INTRAVENOUS | Status: AC

## 2023-11-30 MED ORDER — POTASSIUM CHLORIDE CRYS ER 20 MEQ PO TBCR
40.0000 meq | EXTENDED_RELEASE_TABLET | ORAL | Status: AC
  Administered 2023-11-30: 40 meq via ORAL
  Filled 2023-11-30: qty 2

## 2023-11-30 MED ORDER — ORAL CARE MOUTH RINSE: 15.0000 mL | OROMUCOSAL | Status: DC | PRN

## 2023-11-30 MED ORDER — GADOBUTROL 1 MMOL/ML IV SOLN
6.0000 mL | Freq: Once | INTRAVENOUS | Status: AC | PRN
  Administered 2023-11-30: 6 mL via INTRAVENOUS

## 2023-11-30 NOTE — Progress Notes (Signed)
 Patient had an extremely large type 7 BM. BM is yellow and odorous. Endorses abdominal pain and nausea. Verbalizes that she is unable to control her bowl movements and continues to have diarrhea. Patient given PRN Zofran  due to vomiting as well. Vitals signs are within normal limits. Dr. Pearlean has been made aware. See new orders.

## 2023-11-30 NOTE — Progress Notes (Signed)
 PROGRESS NOTE   Dana Romero, is a 73 y.o. female, DOB - 01-27-1951, FMW:[id number]  Admit date - 11/29/2023   Admitting Physician Domanic Matusek Pearlean, MD  Outpatient Primary MD for the patient is Lazoff, Shawn P, DO  LOS - 0  Chief Complaint  Patient presents with   Abdominal Pain       Brief Narrative:  73 y.o. female with medical history significant for hypertension, diabetes mellitus, CKD, stroke, gout-admitted on 11/29/2023 with intractable emesis and diarrhea as well as abdominal pain and abnormality on CT abdomen and pelvis    -Assessment and Plan: 1)Intractable Vomiting and diarrhea--POA -Symptoms persist -Please see nursing documentation from RN Edmonia Gosling at 11:52 AM - CT abdomen pelvis and MRCP without acute findings to explain patient's symptoms - Continue as needed antiemetics - Continue IV fluids until tolerating oral intake - Send stool for C. difficile and GI pathogen/stool culture  2)Liver Lesion Finding on CTA abdomen pelvis-  A 3 x 2.2 cm centrally hypodense left upper quadrant lesion that abuts the left hepatic lobe and may be part of the liver-new from 2023. W - MRCP without acute findings --Per radiologist--CT abnormality represents an area of encapsulated left upper quadrant fat, likely related to fat necrosis from interval but nonacute insult such as splenic flexure diverticulitis. No suspicious liver lesion or acute finding.  3)HTN - resume carvedilol  -Hold telmisartan for contrast exposure - Okay to continue as needed IV labetalol   Anxiety state Resume fluoxetine , she is also on Xanax  twice daily- resume  DM II (diabetes mellitus, type II), controlled (HCC) -A1c 6.7 reflecting good diabetic control PTA Use Novolog /Humalog Sliding scale insulin  with Accu-Cheks/Fingersticks as ordered   Status is: Inpatient   Disposition: The patient is from: Home              Anticipated d/c is to: Home              Anticipated d/c date is: 1 day               Patient currently is not medically stable to d/c. Barriers: Not Clinically Stable-   Code Status :  -  Code Status: Full Code   Family Communication:    NA (patient is alert, awake and coherent)   DVT Prophylaxis  :   - SCDs   enoxaparin  (LOVENOX ) injection 40 mg Start: 11/30/23 1000   Lab Results  Component Value Date   PLT 208 11/30/2023    Inpatient Medications  Scheduled Meds:  carvedilol   40 mg Oral Daily   Chlorhexidine  Gluconate Cloth  6 each Topical Daily   enoxaparin  (LOVENOX ) injection  40 mg Subcutaneous Q24H   feeding supplement  1 Container Oral TID BM   FLUoxetine   80 mg Oral QHS   fluticasone  furoate-vilanterol  1 puff Inhalation Daily   insulin  aspart  0-9 Units Subcutaneous TID WC & HS   mupirocin ointment  1 Application Nasal BID   pantoprazole  (PROTONIX ) IV  40 mg Intravenous Q24H   Continuous Infusions:  0.9 % NaCl with KCl 40 mEq / L 75 mL/hr at 11/30/23 1353   PRN Meds:.acetaminophen  **OR** acetaminophen , albuterol , HYDROmorphone  (DILAUDID ) injection, labetalol , LORazepam , ondansetron  **OR** ondansetron  (ZOFRAN ) IV, mouth rinse, polyethylene glycol   Anti-infectives (From admission, onward)    None       Subjective: Dana Romero today has no fevers,  ,  No chest pain,   - Vomiting and diarrhea persist -Abdominal pain persist   Objective: Vitals:   11/30/23  1417 11/30/23 1500 11/30/23 1559 11/30/23 2027  BP: (!) 185/84 (!) 167/85 128/77 (!) 163/98  Pulse: 79 72 75 76  Resp: 17 14 16 20   Temp:   98.6 F (37 C) 97.8 F (36.6 C)  TempSrc:   Oral Oral  SpO2: 98% 97% 99% 100%  Weight:      Height:        Intake/Output Summary (Last 24 hours) at 11/30/2023 2056 Last data filed at 11/30/2023 1742 Gross per 24 hour  Intake 723.12 ml  Output 300 ml  Net 423.12 ml   Filed Weights   11/29/23 1638 11/30/23 0054  Weight: 63.5 kg 64.2 kg    Physical Exam Gen:- Awake Alert,  in no apparent distress  HEENT:- [place].AT, No sclera  icterus Neck-Supple Neck,No JVD,.  Lungs-  CTAB , fair symmetrical air movement CV- S1, S2 normal, regular  Abd-  +ve B.Sounds, Abd Soft, epigastric and periumbilical area discomfort without rebound or guarding, no CVA area tenderness Extremity/Skin:- No  edema, pedal pulses present  Psych-affect is appropriate, oriented x3 Neuro-no new focal deficits, no tremors  Data Reviewed: I have personally reviewed following labs and imaging studies  CBC: Recent Labs  Lab 11/29/23 1700 11/30/23 0412  WBC 8.8 9.2  HGB 13.6 11.2*  HCT 40.6 34.6*  MCV 84.4 86.7  PLT 251 208   Basic Metabolic Panel: Recent Labs  Lab 11/29/23 1700 11/30/23 0412  NA 135 135  K 3.3* 3.2*  CL 101 107  CO2 17* 19*  GLUCOSE 172* 197*  BUN 10 8  CREATININE 1.23* 1.20*  CALCIUM 8.8* 7.7*   GFR: Estimated Creatinine Clearance: 35.5 mL/min (A) (by C-G formula based on SCr of 1.2 mg/dL (H)). Liver Function Tests: Recent Labs  Lab 11/29/23 1700  AST 87*  ALT 26  ALKPHOS 203*  BILITOT 1.5*  PROT 6.8  ALBUMIN 3.4*   HbA1C: Recent Labs    11/29/23 1700  HGBA1C 6.7*   Recent Results (from the past 240 hours)  MRSA Next Gen by PCR, Nasal     Status: Abnormal   Collection Time: 11/30/23 12:52 AM   Specimen: Nasal Mucosa; Nasal Swab  Result Value Ref Range Status   MRSA by PCR Next Gen DETECTED (A) NOT DETECTED Final    Comment: RESULT CALLED TO, READ BACK BY AND VERIFIED WITH: NAMIRA RONE [phone], VIRAY,J (NOTE) The GeneXpert MRSA Assay (FDA approved for NASAL specimens only), is one component of a comprehensive MRSA colonization surveillance program. It is not intended to diagnose MRSA infection nor to guide or monitor treatment for MRSA infections. Test performance is not FDA approved in patients less than 9 years old. Performed at [facility], [address] [place], [place] [address]     Radiology Studies: MR ABDOMEN MRCP W WO CONTAST Result Date: 11/30/2023 CLINICAL DATA:   Abdominal pain. Nausea and vomiting. diarrhea. CT demonstrating a left upper quadrant lesion. EXAM: MRI ABDOMEN WITHOUT AND WITH CONTRAST (INCLUDING MRCP) TECHNIQUE: Multiplanar multisequence MR imaging of the abdomen was performed both before and after the administration of intravenous contrast. Heavily T2-weighted images of the biliary and pancreatic ducts were obtained, and three-dimensional MRCP images were rendered by post processing. CONTRAST:  6mL GADAVIST  GADOBUTROL  1 MMOL/ML IV SOLN COMPARISON:  11/29/2023 CTA FINDINGS: The majority of the precontrast series are of relatively high diagnostic quality. However, the pre contrast in and out of phase series and the pre and postcontrast dynamic series are moderate to markedly motion degraded, nearly nondiagnostic.  MRCP images are also degraded by motion. Lower chest: Mild cardiomegaly without pericardial or pleural effusion. Tiny hiatal hernia. Hepatobiliary: The CT abnormality represents an area of encapsulated left upper quadrant fat which is separate from the liver, measuring on the order of 1.3 x 2.3 cm on 11/04 and 18/3. No suspicious liver lesion. Cholecystectomy without biliary duct dilatation or choledocholithiasis. Pancreas:  Normal, without mass or ductal dilatation. Spleen:  Normal in size, without focal abnormality. Adrenals/Urinary Tract: Normal adrenal glands. Upper pole left renal subcentimeter cyst does not warrant specific imaging follow-up. Normal right kidney, without hydronephrosis. Stomach/Bowel: Normal remainder of the stomach and abdominal bowel loops. Vascular/Lymphatic: Aortic atherosclerosis. No retroperitoneal or retrocrural adenopathy. Other:  No ascites. Musculoskeletal: Moderate convex right thoracolumbar spine curvature. IMPRESSION: 1. Moderate motion degradation involving primarily the pre and postcontrast dynamic series. 2. The CT abnormality represents an area of encapsulated left upper quadrant fat, likely related to fat  necrosis from interval but nonacute insult such as splenic flexure diverticulitis. No suspicious liver lesion or acute finding. 3. Incidental findings, including: Aortic Atherosclerosis (ICD10-I70.0). Tiny hiatal hernia. Electronically Signed   By: Rockey Kilts M.D.   On: 11/30/2023 15:03   MR 3D Recon At Scanner Result Date: 11/30/2023 CLINICAL DATA:  Abdominal pain. Nausea and vomiting. diarrhea. CT demonstrating a left upper quadrant lesion. EXAM: MRI ABDOMEN WITHOUT AND WITH CONTRAST (INCLUDING MRCP) TECHNIQUE: Multiplanar multisequence MR imaging of the abdomen was performed both before and after the administration of intravenous contrast. Heavily T2-weighted images of the biliary and pancreatic ducts were obtained, and three-dimensional MRCP images were rendered by post processing. CONTRAST:  6mL GADAVIST  GADOBUTROL  1 MMOL/ML IV SOLN COMPARISON:  11/29/2023 CTA FINDINGS: The majority of the precontrast series are of relatively high diagnostic quality. However, the pre contrast in and out of phase series and the pre and postcontrast dynamic series are moderate to markedly motion degraded, nearly nondiagnostic. MRCP images are also degraded by motion. Lower chest: Mild cardiomegaly without pericardial or pleural effusion. Tiny hiatal hernia. Hepatobiliary: The CT abnormality represents an area of encapsulated left upper quadrant fat which is separate from the liver, measuring on the order of 1.3 x 2.3 cm on 11/04 and 18/3. No suspicious liver lesion. Cholecystectomy without biliary duct dilatation or choledocholithiasis. Pancreas:  Normal, without mass or ductal dilatation. Spleen:  Normal in size, without focal abnormality. Adrenals/Urinary Tract: Normal adrenal glands. Upper pole left renal subcentimeter cyst does not warrant specific imaging follow-up. Normal right kidney, without hydronephrosis. Stomach/Bowel: Normal remainder of the stomach and abdominal bowel loops. Vascular/Lymphatic: Aortic  atherosclerosis. No retroperitoneal or retrocrural adenopathy. Other:  No ascites. Musculoskeletal: Moderate convex right thoracolumbar spine curvature. IMPRESSION: 1. Moderate motion degradation involving primarily the pre and postcontrast dynamic series. 2. The CT abnormality represents an area of encapsulated left upper quadrant fat, likely related to fat necrosis from interval but nonacute insult such as splenic flexure diverticulitis. No suspicious liver lesion or acute finding. 3. Incidental findings, including: Aortic Atherosclerosis (ICD10-I70.0). Tiny hiatal hernia. Electronically Signed   By: Rockey Kilts M.D.   On: 11/30/2023 15:03   CT Angio Abd/Pel W and/or Wo Contrast Result Date: 11/29/2023 CLINICAL DATA:  Mesenteric ischemia, acute Pt with abd pain for awhile, pt states worse over the last month. + N/V, diarrhea yesterday and today. EXAM: CTA ABDOMEN AND PELVIS WITHOUT AND WITH CONTRAST TECHNIQUE: Multidetector CT imaging of the abdomen and pelvis was performed using the standard protocol during bolus administration of intravenous contrast. Multiplanar reconstructed images and  MIPs were obtained and reviewed to evaluate the vascular anatomy. RADIATION DOSE REDUCTION: This exam was performed according to the departmental dose-optimization program which includes automated exposure control, adjustment of the mA and/or kV according to patient size and/or use of iterative reconstruction technique. CONTRAST:  80mL OMNIPAQUE  IOHEXOL  350 MG/ML SOLN COMPARISON:  CT abdomen pelvis 11/25/2023, CT abdomen pelvis 11/10/2021, CT abdomen pelvis 01/16/2020 FINDINGS: VASCULAR Aorta: Mild atherosclerotic plaque. Normal caliber aorta without aneurysm, dissection, vasculitis or significant stenosis. Celiac: Patent without evidence of aneurysm, dissection, vasculitis or significant stenosis. SMA: Patent without evidence of aneurysm, dissection, vasculitis or significant stenosis. Renals: Mild atherosclerotic plaque.  Both renal arteries are patent without evidence of aneurysm, dissection, vasculitis, fibromuscular dysplasia or significant stenosis. IMA: Patent without evidence of aneurysm, dissection, vasculitis or significant stenosis. Inflow: Patent without evidence of aneurysm, dissection, vasculitis or significant stenosis. Proximal Outflow: Bilateral common femoral and visualized portions of the superficial and profunda femoral arteries are patent without evidence of aneurysm, dissection, vasculitis or significant stenosis. Veins: No portal mesenteric venous gas. The main portal, splenic, superior mesenteric veins are patent. Review of the MIP images confirms the above findings. NON-VASCULAR Lower chest: No acute abnormality. Hepatobiliary: There is a 3 x 2.2 cm centrally hypodense left upper quadrant lesion that abuts the left hepatic lobe and may be part of the liver-new from 2023 (9:80, 4:42). No gallstones, gallbladder wall thickening, or pericholecystic fluid. No biliary dilatation. Pancreas: Diffusely atrophic. No focal lesion. Otherwise normal pancreatic contour. No surrounding inflammatory changes. No main pancreatic ductal dilatation. Spleen: Normal in size without focal abnormality. Adrenals/Urinary Tract: No adrenal nodule bilaterally. Bilateral kidneys enhance symmetrically. No hydronephrosis. No hydroureter. The urinary bladder is unremarkable. Stomach/Bowel: Stomach is within normal limits. No evidence of bowel wall thickening or dilatation. A The appendix is not definitely identified with no inflammatory changes in the right lower quadrant to suggest acute appendicitis. Lymphatic: No lymphadenopathy. Reproductive: Uterus and bilateral adnexa are unremarkable. Other: No intraperitoneal free fluid. No intraperitoneal free gas. No organized fluid collection. Musculoskeletal: Tiny fat containing left inguinal hernia. No suspicious lytic or blastic osseous lesions. No acute displaced fracture. Thoracolumbar  dextroscoliosis. IMPRESSION: VASCULAR 1. Aortic Atherosclerosis (ICD10-I70.0)-mild. NON-VASCULAR 1. A 3 x 2.2 cm centrally hypodense left upper quadrant lesion that abuts the left hepatic lobe and may be part of the liver-new from 2023. When the patient is clinically stable and able to follow directions and hold their breath (preferably as an outpatient) further evaluation with dedicated MRI liver protocol should be considered. 2. Tiny fat containing left inguinal hernia. Electronically Signed   By: Morgane  Naveau M.D.   On: 11/29/2023 19:46   Scheduled Meds:  carvedilol   40 mg Oral Daily   Chlorhexidine  Gluconate Cloth  6 each Topical Daily   enoxaparin  (LOVENOX ) injection  40 mg Subcutaneous Q24H   feeding supplement  1 Container Oral TID BM   FLUoxetine   80 mg Oral QHS   fluticasone  furoate-vilanterol  1 puff Inhalation Daily   insulin  aspart  0-9 Units Subcutaneous TID WC & HS   mupirocin ointment  1 Application Nasal BID   pantoprazole  (PROTONIX ) IV  40 mg Intravenous Q24H   Continuous Infusions:  0.9 % NaCl with KCl 40 mEq / L 75 mL/hr at 11/30/23 1353     LOS: 0 days   Rendall Carwin M.D on 11/30/2023 at 8:56 PM  Go to [email or web address] - for contact info  [facility] - Office  [phone]  If 7PM-7AM, please contact night-coverage [email or web address] 11/30/2023,  8:56 PM

## 2023-11-30 NOTE — TOC CM/SW Note (Signed)
 Transition of Care [facility]) - Inpatient Brief Assessment   Patient Details  Name: Dana Romero MRN: [id number] Date of Birth: Jun 07, 1950  Transition of Care [facility]) CM/SW Contact:    Lucie Lunger, LCSWA Phone Number: 11/30/2023, 8:50 AM   Clinical Narrative: Transition of Care Department [facility]) has reviewed patient and no TOC needs have been identified at this time. We will continue to monitor patient advancement through interdiciplinary progression rounds. If new patient transition needs arise, please place a TOC consult.  Transition of Care Asessment: Insurance and Status: Insurance coverage has been reviewed Patient has primary care physician: Yes Home environment has been reviewed: From home Prior level of function:: Independent Prior/Current Home Services: No current home services Social Drivers of Health Review: SDOH reviewed no interventions necessary Readmission risk has been reviewed: Yes Transition of care needs: no transition of care needs at this time

## 2023-12-01 DIAGNOSIS — R1110 Vomiting, unspecified: Secondary | ICD-10-CM | POA: Diagnosis not present

## 2023-12-01 LAB — RENAL FUNCTION PANEL
Albumin: 2.9 g/dL — ABNORMAL LOW (ref 3.5–5.0)
Anion gap: 11 (ref 5–15)
BUN: 7 mg/dL — ABNORMAL LOW (ref 8–23)
CO2: 18 mmol/L — ABNORMAL LOW (ref 22–32)
Calcium: 8.4 mg/dL — ABNORMAL LOW (ref 8.9–10.3)
Chloride: 109 mmol/L (ref 98–111)
Creatinine, Ser: 1.2 mg/dL — ABNORMAL HIGH (ref 0.44–1.00)
GFR, Estimated: 48 mL/min — ABNORMAL LOW (ref >60)
Glucose, Bld: 149 mg/dL — ABNORMAL HIGH (ref 70–99)
Phosphorus: 2.5 mg/dL (ref 2.5–4.6)
Potassium: 4.2 mmol/L (ref 3.5–5.1)
Sodium: 138 mmol/L (ref 135–145)

## 2023-12-01 LAB — GLUCOSE, CAPILLARY
Glucose-Capillary: 205 mg/dL — ABNORMAL HIGH (ref 70–99)
Glucose-Capillary: 92 mg/dL (ref 70–99)

## 2023-12-01 MED ORDER — ALBUTEROL SULFATE HFA 108 (90 BASE) MCG/ACT IN AERS: INHALATION_SPRAY | RESPIRATORY_TRACT | 2 refills | Status: AC

## 2023-12-01 MED ORDER — OLMESARTAN MEDOXOMIL 40 MG PO TABS: 40.0000 mg | ORAL_TABLET | Freq: Every day | ORAL | 5 refills | Status: AC

## 2023-12-01 MED ORDER — PROCHLORPERAZINE 25 MG RE SUPP
25.0000 mg | Freq: Once | RECTAL | Status: AC
  Administered 2023-12-01: 25 mg via RECTAL
  Filled 2023-12-01: qty 1

## 2023-12-01 MED ORDER — ALBUTEROL SULFATE (2.5 MG/3ML) 0.083% IN NEBU: 2.5000 mg | INHALATION_SOLUTION | Freq: Four times a day (QID) | RESPIRATORY_TRACT | 12 refills | Status: AC | PRN

## 2023-12-01 MED ORDER — CARVEDILOL PHOSPHATE ER 40 MG PO CP24: 40.0000 mg | ORAL_CAPSULE | Freq: Every day | ORAL | 3 refills | Status: AC

## 2023-12-01 MED ORDER — ONDANSETRON 4 MG PO TBDP: 4.0000 mg | ORAL_TABLET | Freq: Three times a day (TID) | ORAL | 0 refills | Status: DC | PRN

## 2023-12-01 NOTE — Plan of Care (Signed)

## 2023-12-01 NOTE — Discharge Instructions (Signed)
 1)The 'BRAT' diet is suggested, then progress to diet as tolerated as symptoms abate.  -- BRAT (bananas, rice, apples, toast) -you may also consume other mild foods that ease the GI tract such as saltines, oatmeal, or boiled potatoes. Call if bloody stools, persistent diarrhea, vomiting, fever or abdominal pain.  2)Follow-up Gastroenterologist Dr. Carlin Hasty with [facility]--- in 1 to 2 weeks for evaluation  -address: [address], [place], [place] [address], Phone: [phone]  3) follow-up with primary care physician in 3 to 5 days for blood pressure recheck  4)Avoid ibuprofen/Advil/Aleve /Motrin/Goody Powders/Naproxen /BC powders/Meloxicam/Diclofenac/Indomethacin and other Nonsteroidal anti-inflammatory medications as these will make you more likely to bleed and can cause stomach ulcers, can also cause Kidney problems.

## 2023-12-01 NOTE — Discharge Summary (Signed)
 Dana Romero, is a 73 y.o. female  DOB [birth year]  MRN [id number].  Admission date:  11/29/2023  Admitting Physician  Rendall Carwin, MD  Discharge Date:  12/01/2023   Primary MD  Macarthur Elouise SQUIBB, DO  Recommendations for primary care physician for things to follow:  1)The 'BRAT' diet is suggested, then progress to diet as tolerated as symptoms abate.  -- BRAT (bananas, rice, apples, toast) -you may also consume other mild foods that ease the GI tract such as saltines, oatmeal, or boiled potatoes. Call if bloody stools, persistent diarrhea, vomiting, fever or abdominal pain.  2)Follow-up Gastroenterologist Dr. Carlin Hasty with [facility]--- in 1 to 2 weeks for evaluation  -address: [address] [place], [place] [address], Phone: [phone]  3) follow-up with primary care physician in 3 to 5 days for blood pressure recheck  4)Avoid ibuprofen/Advil/Aleve /Motrin/Goody Powders/Naproxen /BC powders/Meloxicam/Diclofenac/Indomethacin and other Nonsteroidal anti-inflammatory medications as these will make you more likely to bleed and can cause stomach ulcers, can also cause Kidney problems.   Admission Diagnosis  Intractable vomiting [R11.10] Emesis, persistent [R11.15]   Discharge Diagnosis  Intractable vomiting [R11.10] Emesis, persistent [R11.15]    Principal Problem:   Intractable vomiting Active Problems:   Abdominal pain, chronic, epigastric   Liver lesion   DM II (diabetes mellitus, type II), controlled (HCC)   Anxiety state   Essential hypertension   Asthma   Emesis, persistent      Past Medical History:  Diagnosis Date   Anxiety    Asthma    Caregiver stress 07/04/2011   Chronic pain syndrome    Degenerative disc disease, cervical    Depression    Dyspnea    due to hiatal hernia   Essential hypertension 1998   GERD (gastroesophageal reflux disease)    Gout     Hiatal hernia    History of pancreatitis 06/08/2005   History of stroke    September 2020   N&V (nausea and vomiting) 04/13/2021   Pelvic mass    Pneumonia    x 2   PONV (postoperative nausea and vomiting)    Renal insufficiency    Seasonal allergies    Sinusitis, chronic    Stroke (HCC)    Transfusion history    Type 2 diabetes mellitus (HCC) 1998    Past Surgical History:  Procedure Laterality Date   APPENDECTOMY     ARM DEBRIDEMENT Left    debridement of axilla spider bite   BALLOON DILATION N/A 06/30/2021   Procedure: BALLOON DILATION;  Surgeon: Hasty Carlin POUR, DO;  [facility];  Service: Endoscopy;  Laterality: N/A;   BALLOON DILATION N/A 01/18/2022   Procedure: BALLOON DILATION;  Surgeon: Hasty Carlin POUR, DO;  [facility];  Service: Endoscopy;  Laterality: N/A;   BALLOON DILATION N/A 11/13/2022   Procedure: BALLOON DILATION;  Surgeon: Hasty Carlin POUR, DO;  [facility];  Service: Endoscopy;  Laterality: N/A;   BIOPSY  06/30/2021   Procedure: BIOPSY;  Surgeon: Hasty Carlin POUR, DO;  [facility];  Service: Endoscopy;;   BIOPSY  01/18/2022   Procedure: BIOPSY;  Surgeon: Cindie Carlin POUR, DO;  [facility];  Service: Endoscopy;;   BIOPSY  11/13/2022   Procedure: BIOPSY;  Surgeon: Cindie Carlin POUR, DO;  [facility];  Service: Endoscopy;;   BREAST SURGERY     breast cyst x2 -last '12   CATARACT EXTRACTION W/PHACO Left 11/25/2021   Procedure: CATARACT EXTRACTION PHACO AND INTRAOCULAR LENS PLACEMENT (IOC);  Surgeon: Harrie Agent, MD;  Location: [facility];  Service: Ophthalmology;  Laterality: Left;  CDE 4.11   CATARACT EXTRACTION W/PHACO Right 01/06/2022   Procedure: CATARACT EXTRACTION PHACO AND INTRAOCULAR LENS PLACEMENT (IOC);  Surgeon: Harrie Agent, MD;  Location: [facility];  Service: Ophthalmology;  Laterality: Right;  CDE 4.97   CHOLECYSTECTOMY N/A 08/11/2015   Procedure: LAPAROSCOPIC  CHOLECYSTECTOMY;  Surgeon: Herlene Righter Kinsinger, MD;  Location: [facility];  Service: General;  Laterality: N/A;   COLONOSCOPY WITH PROPOFOL  N/A 11/13/2022   Procedure: COLONOSCOPY WITH PROPOFOL ;  Surgeon: Cindie Carlin POUR, DO;  [facility];  Service: Endoscopy;  Laterality: N/A;  8:30 am, asa 3   ESOPHAGOGASTRODUODENOSCOPY N/A 07/17/2023   Procedure: EGD (ESOPHAGOGASTRODUODENOSCOPY);  Surgeon: Stevie, Herlene Righter, MD;  [facility];  Service: General;  Laterality: N/A;   ESOPHAGOGASTRODUODENOSCOPY (EGD) WITH PROPOFOL  N/A 06/30/2021   Surgeon: Cindie Carlin POUR, DO;   Moderate size hiatal hernia, moderate Schatzki's ring dilated, gastritis biopsied, normal examined duodenum.  Pathology with gastric antral and oxyntic mucosa with nonspecific reactive gastropathy, negative for H. pylori.   ESOPHAGOGASTRODUODENOSCOPY (EGD) WITH PROPOFOL  N/A 01/18/2022   Procedure: ESOPHAGOGASTRODUODENOSCOPY (EGD) WITH PROPOFOL ;  Surgeon: Cindie Carlin POUR, DO;  [facility];  Service: Endoscopy;  Laterality: N/A;  1:15 pm   ESOPHAGOGASTRODUODENOSCOPY (EGD) WITH PROPOFOL  N/A 11/13/2022   Procedure: ESOPHAGOGASTRODUODENOSCOPY (EGD) WITH PROPOFOL ;  Surgeon: Cindie Carlin POUR, DO;  [facility];  Service: Endoscopy;  Laterality: N/A;   ESOPHAGOGASTRODUODENOSCOPY (EGD) WITH PROPOFOL  N/A 03/13/2023   Procedure: ESOPHAGOGASTRODUODENOSCOPY (EGD) WITH PROPOFOL ;  Surgeon: Eartha Angelia Sieving, MD;  [facility];  Service: Gastroenterology;  Laterality: N/A;  945am, asa 3   LAPAROSCOPIC INSERTION GASTROSTOMY TUBE N/A 07/17/2023   Procedure: INSERTION, GASTROSTOMY TUBE, PERCUTANEOUS;  Surgeon: Kinsinger, Herlene Righter, MD;  [facility];  Service: General;  Laterality: N/A;   POLYPECTOMY  11/13/2022   Procedure: POLYPECTOMY;  Surgeon: Cindie Carlin POUR, DO;  [facility];  Service: Endoscopy;;   SAVORY DILATION N/A 03/13/2023   Procedure: HARLEY DILATION;  Surgeon: Eartha Angelia Sieving, MD;  [facility];  Service: Gastroenterology;  Laterality: N/A;   TONSILLECTOMY     TONSILLECTOMY AND ADENOIDECTOMY     TUBAL LIGATION     post partum   XI ROBOTIC ASSISTED HIATAL HERNIA REPAIR N/A 07/17/2023   Procedure: REPAIR, HERNIA, HIATAL, ROBOT-ASSISTED;  Surgeon: Kinsinger, Herlene Righter, MD;  Location: [facility];  Service: General;  Laterality: N/A;     HPI  from the history and physical done on the day of admission:   Chief Complaint: Vomiting, abdominal pain   HPI: Dana Romero is a 73 y.o. female with medical history significant for hypertension, diabetes mellitus, CKD, stroke, gout. Patient presented to the ED with complaints of generalized abdominal pain and vomiting.  She reports intermittent episodes over the past 3 weeks.  She reports for the past few days she has not been able to keep anything down.  Reports initial diarrhea, but none in the past 2 days.  She denies use of marijuana.   ED Course: 298.  Heart rate 78-84.  Respiratory 12-16.  Blood pressure systolic 178-195.  O2 sats greater than 99% on room air. WBC 8.8.  Lactic acid 3.  Potassium 3.3. CTA AP W C-  A 3 x 2.2 cm centrally hypodense left upper quadrant lesion that abuts the left hepatic lobe and may be part of the liver-new from 2023.  Follow-up MRI liver protocol recommended preferably as outpatient. 1 L bolus given.  Zofran  given x 2. With persistent vomiting, and lactic acidosis, hospitalist was called to admit.   Review of Systems: As per HPI all other systems reviewed and negative.    Hospital Course:     Brief Narrative:  73 y.o. female with medical history significant for hypertension, diabetes mellitus, CKD, stroke, gout-admitted on 11/29/2023 with intractable emesis and diarrhea as well as abdominal pain and abnormality on CT abdomen and pelvis     -Assessment and Plan: 1)Intractable Vomiting and diarrhea--POA - CT abdomen pelvis and MRCP without acute findings to  explain patient's symptoms - No further diarrhea unable to collect stool samples -Eating and drinking okay -Antiemetics appear to be effective no further emesis -No fevers or chills   2)Liver Lesion Finding on CTA abdomen pelvis-  A 3 x 2.2 cm centrally hypodense left upper quadrant lesion that abuts the left hepatic lobe and may be part of the liver-new from 2023. W - MRCP without acute findings --Per radiologist--CT abnormality represents an area of encapsulated left upper quadrant fat, likely related to fat necrosis from interval but nonacute insult such as splenic flexure diverticulitis. No suspicious liver lesion or acute finding.   3)HTN -BP running high because we are holding some of her home BP meds due to contrast exposure - resume carvedilol  and telmisartan   4)CKD stage -3A - Renal function currently stable, around baseline - renally adjust medications, avoid nephrotoxic agents / dehydration  / hypotension   5) acute mild anemia--- suspect hemodilution related -Hgb down from baseline usually around 12 to 11.2  - On admission Hgb was 13.6 she was probably hemoconcentrated due to diarrhea and vomiting   Anxiety state - Okay to continue PTA fluoxetine  and Xanax    DM II (diabetes mellitus, type II), controlled (HCC) -A1c 6.7 reflecting good diabetic control PTA -Continue PTA regimen   Disposition: The patient is from: Home              Anticipated d/c is to: Home   Discharge Condition: Stable,  Follow UP   Follow-up Information     Lazoff, Shawn P, DO. Schedule an appointment as soon as possible for a visit in 1 week(s).   Specialty: Family Medicine Contact information: [address] [place] [address] [phone]         Cindie Carlin POUR, DO. Schedule an appointment as soon as possible for a visit.   Specialty: Gastroenterology Why: If symptoms worsen Contact information: [address] [place] [address] [phone]                  Diet and Activity recommendation:  As advised  Discharge Instructions    Discharge Instructions     Call MD for:  difficulty breathing, headache or visual disturbances   Complete by: As directed    Call MD for:  persistant dizziness or light-headedness   Complete by: As directed    Call MD for:  persistant nausea and vomiting   Complete by: As directed    Call MD for:  temperature >100.4   Complete by: As directed    Diet - low sodium heart healthy   Complete by: As directed    Discharge instructions   Complete by: As directed    1)The 'BRAT' diet is suggested, then progress to diet as tolerated as symptoms abate.  -- BRAT (bananas, rice, apples, toast) -you may also consume other mild foods that ease the GI tract such as saltines, oatmeal, or boiled potatoes. Call if bloody stools, persistent diarrhea, vomiting, fever or abdominal pain.  2)Follow-up Gastroenterologist Dr. Carlin Hasty with [facility]--- in 1 to 2 weeks for evaluation  -address: [address] [place], [place] [address], Phone: [phone]  3) follow-up with primary care physician in 3 to 5 days for blood pressure recheck  4)Avoid ibuprofen/Advil/Aleve /Motrin/Goody Powders/Naproxen /BC powders/Meloxicam/Diclofenac/Indomethacin and other Nonsteroidal anti-inflammatory medications as these will make you more likely to bleed and can cause stomach ulcers, can also cause Kidney problems.   Increase activity slowly   Complete by: As directed          Discharge Medications     Allergies as of 12/01/2023       Reactions   Pregabalin Other (See Comments)   Makes pt out of it   Amlodipine Besylate Other (See Comments)   Reaction not cited   Atorvastatin Other (See Comments)   Muscle/joint pain especially hip pain   Clopidogrel Other (See Comments)   Reaction not recalled   Ezetimibe -simvastatin Other (See Comments)   Muscle/joint pain especially hip pain   Losartan   Potassium-hctz Nausea Only, Other (See Comments)   Felt fatigued too   Losartan  Potassium-hctz Other (See Comments)   Made the hips hurt   Morphine Other (See Comments)   Migraine   Niacin Other (See Comments)   Flushing    Rosuvastatin Other (See Comments)   Muscle/joint pain especially hip pain   Semaglutide Nausea Only   Statins    Hip pain   Toprol Xl [metoprolol Succinate] Other (See Comments)   Makes her feel weak; fatigued   Valsartan Other (See Comments)   Reaction not cited   Aspirin Hives   Diazepam  Anxiety, Other (See Comments)   Had reverse effect on patient   Latex Rash        Medication List     TAKE these medications    Advair HFA 115-21 MCG/ACT inhaler Generic drug: fluticasone -salmeterol INHALE 2 PUFFS INTO LUNGS TWICE DAILY What changed: See the new instructions.   albuterol  108 (90 Base) MCG/ACT inhaler Commonly known as: Ventolin  HFA INHALE ONE TO TWO PUFFS BY MOUTH EVERY 6 HOURS AS NEEDED FOR WHEEZING OR SHORTNESS OF BREATH (OR PERSISTENT COUGHING) What changed: Another medication with the same name was changed. Make sure you understand how and when to take each.   albuterol  (2.5 MG/3ML) 0.083% nebulizer solution Commonly known as: PROVENTIL  Take 3 mLs (2.5 mg total) by nebulization every 6 (six) hours as needed for wheezing or shortness of breath. What changed: See the new instructions.   ALPRAZolam  1 MG tablet Commonly known as: XANAX  Take 1 mg by mouth in the morning and at bedtime.   ARIPiprazole  15 MG tablet Commonly known as: ABILIFY  Take 15 mg by mouth at bedtime.   Biofreeze 4 % Gel Generic drug: Menthol (Topical Analgesic) Apply 1 application  topically as needed (pain- affected areas).   carvedilol  40 MG 24 hr capsule Commonly known  as: COREG  CR Take 1 capsule (40 mg total) by mouth daily.   cyanocobalamin 1000 MCG tablet Commonly known as: VITAMIN B12 Take 1,000 mcg by mouth daily.   diphenhydrAMINE  50 MG  capsule Commonly known as: BENADRYL  Take 50 mg by mouth at bedtime.   EPINEPHrine  0.3 mg/0.3 mL Soaj injection Commonly known as: EpiPen  2-Pak Inject 0.3 mLs (0.3 mg total) into the muscle once. What changed:  when to take this reasons to take this   estradiol  0.05 MG/24HR patch Commonly known as: VIVELLE -DOT Place 1 patch onto the skin 2 (two) times a week.   FLUoxetine  40 MG capsule Commonly known as: PROZAC  Take 80 mg by mouth at bedtime.   fluticasone  50 MCG/ACT nasal spray Commonly known as: FLONASE  Place 2 sprays into both nostrils daily as needed for allergies or rhinitis.   glucose blood test strip Test blood sugar daily.  DX: E11.9   magnesium  oxide 400 MG tablet Commonly known as: MAG-OX Take 400 mg by mouth See admin instructions. Take 400 mg by mouth at 10 AM   meclizine  25 MG tablet Commonly known as: ANTIVERT  Take 25 mg by mouth 3 (three) times daily as needed for dizziness or nausea.   medroxyPROGESTERone  2.5 MG tablet Commonly known as: PROVERA  TAKE ONE TABLET BY MOUTH ONCE DAILY What changed: when to take this   multivitamin with minerals Tabs tablet Take 1 tablet by mouth daily after breakfast.   Narcan 4 MG/0.1ML Liqd nasal spray kit Generic drug: naloxone Place 1 spray into the nose See admin instructions. Instill 1 spray into the nose every 3 minutes until awake of EMS arrives- for an accidental opioid overdose   olmesartan  40 MG tablet Commonly known as: BENICAR  Take 1 tablet (40 mg total) by mouth daily.   ondansetron  4 MG disintegrating tablet Commonly known as: ZOFRAN -ODT Take 1 tablet (4 mg total) by mouth every 8 (eight) hours as needed for vomiting or nausea (dissolve orally).   oxyCODONE  5 MG immediate release tablet Commonly known as: Oxy IR/ROXICODONE  Take 1 tablet (5 mg total) by mouth every 6 (six) hours as needed for severe pain (pain score 7-10).   oxyCODONE -acetaminophen  10-325 MG tablet Commonly known as: PERCOCET Take 1  tablet by mouth See admin instructions. Take 1 tablet by mouth in the morning, at noontime, and at bedtime- may take 1 additional tablet once a day as needed for pain   pantoprazole  40 MG tablet Commonly known as: PROTONIX  TAKE 1 TABLET (40 MG TOTAL) BY MOUTH TWICE A DAY BEFORE MEALS What changed: See the new instructions.   Systane Balance 0.6 % Soln Generic drug: Propylene Glycol Place 1 drop into both eyes in the morning, at noon, and at bedtime.   VITAMIN C PO Take 1,000 mg by mouth in the morning.   Vitamin D3 50 MCG (2000 UT) Tabs Take 2,000 Units by mouth daily.   ZyrTEC  Allergy 10 MG tablet Generic drug: cetirizine  Take 10 mg by mouth at bedtime.        Major procedures and Radiology Reports - PLEASE review detailed and final reports for all details, in brief -   MR ABDOMEN MRCP W WO CONTAST Result Date: 11/30/2023 CLINICAL DATA:  Abdominal pain. Nausea and vomiting. diarrhea. CT demonstrating a left upper quadrant lesion. EXAM: MRI ABDOMEN WITHOUT AND WITH CONTRAST (INCLUDING MRCP) TECHNIQUE: Multiplanar multisequence MR imaging of the abdomen was performed both before and after the administration of intravenous contrast. Heavily T2-weighted images of the biliary and pancreatic  ducts were obtained, and three-dimensional MRCP images were rendered by post processing. CONTRAST:  6mL GADAVIST  GADOBUTROL  1 MMOL/ML IV SOLN COMPARISON:  11/29/2023 CTA FINDINGS: The majority of the precontrast series are of relatively high diagnostic quality. However, the pre contrast in and out of phase series and the pre and postcontrast dynamic series are moderate to markedly motion degraded, nearly nondiagnostic. MRCP images are also degraded by motion. Lower chest: Mild cardiomegaly without pericardial or pleural effusion. Tiny hiatal hernia. Hepatobiliary: The CT abnormality represents an area of encapsulated left upper quadrant fat which is separate from the liver, measuring on the order of 1.3 x  2.3 cm on 11/04 and 18/3. No suspicious liver lesion. Cholecystectomy without biliary duct dilatation or choledocholithiasis. Pancreas:  Normal, without mass or ductal dilatation. Spleen:  Normal in size, without focal abnormality. Adrenals/Urinary Tract: Normal adrenal glands. Upper pole left renal subcentimeter cyst does not warrant specific imaging follow-up. Normal right kidney, without hydronephrosis. Stomach/Bowel: Normal remainder of the stomach and abdominal bowel loops. Vascular/Lymphatic: Aortic atherosclerosis. No retroperitoneal or retrocrural adenopathy. Other:  No ascites. Musculoskeletal: Moderate convex right thoracolumbar spine curvature. IMPRESSION: 1. Moderate motion degradation involving primarily the pre and postcontrast dynamic series. 2. The CT abnormality represents an area of encapsulated left upper quadrant fat, likely related to fat necrosis from interval but nonacute insult such as splenic flexure diverticulitis. No suspicious liver lesion or acute finding. 3. Incidental findings, including: Aortic Atherosclerosis (ICD10-I70.0). Tiny hiatal hernia. Electronically Signed   By: Rockey Kilts M.D.   On: 11/30/2023 15:03   MR 3D Recon At Scanner Result Date: 11/30/2023 CLINICAL DATA:  Abdominal pain. Nausea and vomiting. diarrhea. CT demonstrating a left upper quadrant lesion. EXAM: MRI ABDOMEN WITHOUT AND WITH CONTRAST (INCLUDING MRCP) TECHNIQUE: Multiplanar multisequence MR imaging of the abdomen was performed both before and after the administration of intravenous contrast. Heavily T2-weighted images of the biliary and pancreatic ducts were obtained, and three-dimensional MRCP images were rendered by post processing. CONTRAST:  6mL GADAVIST  GADOBUTROL  1 MMOL/ML IV SOLN COMPARISON:  11/29/2023 CTA FINDINGS: The majority of the precontrast series are of relatively high diagnostic quality. However, the pre contrast in and out of phase series and the pre and postcontrast dynamic series are  moderate to markedly motion degraded, nearly nondiagnostic. MRCP images are also degraded by motion. Lower chest: Mild cardiomegaly without pericardial or pleural effusion. Tiny hiatal hernia. Hepatobiliary: The CT abnormality represents an area of encapsulated left upper quadrant fat which is separate from the liver, measuring on the order of 1.3 x 2.3 cm on 11/04 and 18/3. No suspicious liver lesion. Cholecystectomy without biliary duct dilatation or choledocholithiasis. Pancreas:  Normal, without mass or ductal dilatation. Spleen:  Normal in size, without focal abnormality. Adrenals/Urinary Tract: Normal adrenal glands. Upper pole left renal subcentimeter cyst does not warrant specific imaging follow-up. Normal right kidney, without hydronephrosis. Stomach/Bowel: Normal remainder of the stomach and abdominal bowel loops. Vascular/Lymphatic: Aortic atherosclerosis. No retroperitoneal or retrocrural adenopathy. Other:  No ascites. Musculoskeletal: Moderate convex right thoracolumbar spine curvature. IMPRESSION: 1. Moderate motion degradation involving primarily the pre and postcontrast dynamic series. 2. The CT abnormality represents an area of encapsulated left upper quadrant fat, likely related to fat necrosis from interval but nonacute insult such as splenic flexure diverticulitis. No suspicious liver lesion or acute finding. 3. Incidental findings, including: Aortic Atherosclerosis (ICD10-I70.0). Tiny hiatal hernia. Electronically Signed   By: Rockey Kilts M.D.   On: 11/30/2023 15:03   CT Angio Abd/Pel W and/or Wo  Contrast Result Date: 11/29/2023 CLINICAL DATA:  Mesenteric ischemia, acute Pt with abd pain for awhile, pt states worse over the last month. + N/V, diarrhea yesterday and today. EXAM: CTA ABDOMEN AND PELVIS WITHOUT AND WITH CONTRAST TECHNIQUE: Multidetector CT imaging of the abdomen and pelvis was performed using the standard protocol during bolus administration of intravenous contrast.  Multiplanar reconstructed images and MIPs were obtained and reviewed to evaluate the vascular anatomy. RADIATION DOSE REDUCTION: This exam was performed according to the departmental dose-optimization program which includes automated exposure control, adjustment of the mA and/or kV according to patient size and/or use of iterative reconstruction technique. CONTRAST:  80mL OMNIPAQUE  IOHEXOL  350 MG/ML SOLN COMPARISON:  CT abdomen pelvis 11/25/2023, CT abdomen pelvis 11/10/2021, CT abdomen pelvis 01/16/2020 FINDINGS: VASCULAR Aorta: Mild atherosclerotic plaque. Normal caliber aorta without aneurysm, dissection, vasculitis or significant stenosis. Celiac: Patent without evidence of aneurysm, dissection, vasculitis or significant stenosis. SMA: Patent without evidence of aneurysm, dissection, vasculitis or significant stenosis. Renals: Mild atherosclerotic plaque. Both renal arteries are patent without evidence of aneurysm, dissection, vasculitis, fibromuscular dysplasia or significant stenosis. IMA: Patent without evidence of aneurysm, dissection, vasculitis or significant stenosis. Inflow: Patent without evidence of aneurysm, dissection, vasculitis or significant stenosis. Proximal Outflow: Bilateral common femoral and visualized portions of the superficial and profunda femoral arteries are patent without evidence of aneurysm, dissection, vasculitis or significant stenosis. Veins: No portal mesenteric venous gas. The main portal, splenic, superior mesenteric veins are patent. Review of the MIP images confirms the above findings. NON-VASCULAR Lower chest: No acute abnormality. Hepatobiliary: There is a 3 x 2.2 cm centrally hypodense left upper quadrant lesion that abuts the left hepatic lobe and may be part of the liver-new from 2023 (9:80, 4:42). No gallstones, gallbladder wall thickening, or pericholecystic fluid. No biliary dilatation. Pancreas: Diffusely atrophic. No focal lesion. Otherwise normal pancreatic  contour. No surrounding inflammatory changes. No main pancreatic ductal dilatation. Spleen: Normal in size without focal abnormality. Adrenals/Urinary Tract: No adrenal nodule bilaterally. Bilateral kidneys enhance symmetrically. No hydronephrosis. No hydroureter. The urinary bladder is unremarkable. Stomach/Bowel: Stomach is within normal limits. No evidence of bowel wall thickening or dilatation. A The appendix is not definitely identified with no inflammatory changes in the right lower quadrant to suggest acute appendicitis. Lymphatic: No lymphadenopathy. Reproductive: Uterus and bilateral adnexa are unremarkable. Other: No intraperitoneal free fluid. No intraperitoneal free gas. No organized fluid collection. Musculoskeletal: Tiny fat containing left inguinal hernia. No suspicious lytic or blastic osseous lesions. No acute displaced fracture. Thoracolumbar dextroscoliosis. IMPRESSION: VASCULAR 1. Aortic Atherosclerosis (ICD10-I70.0)-mild. NON-VASCULAR 1. A 3 x 2.2 cm centrally hypodense left upper quadrant lesion that abuts the left hepatic lobe and may be part of the liver-new from 2023. When the patient is clinically stable and able to follow directions and hold their breath (preferably as an outpatient) further evaluation with dedicated MRI liver protocol should be considered. 2. Tiny fat containing left inguinal hernia. Electronically Signed   By: Morgane  Naveau M.D.   On: 11/29/2023 19:46   CT ABDOMEN PELVIS WO CONTRAST Result Date: 11/25/2023 CLINICAL DATA:  Nausea and abdominal pain. History of hiatal hernia repair EXAM: CT ABDOMEN AND PELVIS WITHOUT CONTRAST TECHNIQUE: Multidetector CT imaging of the abdomen and pelvis was performed following the standard protocol without IV contrast. RADIATION DOSE REDUCTION: This exam was performed according to the departmental dose-optimization program which includes automated exposure control, adjustment of the mA and/or kV according to patient size and/or use of  iterative reconstruction technique. COMPARISON:  CT  11/11/2018. FINDINGS: Lower chest: Breathing motion artifact. Small sub pleural nodular opacity in the left lower lobe, series 4, image 24 likely represents scarring and was present on prior exam. Hepatobiliary: No focal liver abnormality on this unenhanced exam. Cholecystectomy. No biliary dilatation. Pancreas: Parenchymal atrophy. No ductal dilatation or inflammation. Spleen: Calcified granuloma in the spleen.  Normal in size. Adrenals/Urinary Tract: No adrenal nodule. Left renal atrophy. No hydronephrosis. No renal calculi. No evidence of focal renal abnormality on this unenhanced exam. Unremarkable urinary bladder. Stomach/Bowel: Repair of prior hiatal hernia. There is mild wall thickening of the distal esophagus. No recurrent hernia. The duodenum is mildly dilated and fluid-filled. 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 Remaining small bowel is normal in caliber. No small bowel inflammation. Low lying cecum in the mid pelvis. Appendectomy per history. Small to moderate volume of colonic stool. Wall thickening at the junction of the descending and sigmoid colon versus nondistention, series 2, image 62. Vascular/Lymphatic: Aortic atherosclerosis. No aortic aneurysm there is narrowing of the SMA aortic distance at 5 mm. 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 Please note that detailed vascular evaluation is limited in the absence of IV contrast. No enlarged lymph nodes in the abdomen or pelvis. Reproductive: Uterus and bilateral adnexa are unremarkable. Other: No free air, free fluid, or intra-abdominal fluid collection. Small fat containing left inguinal hernia. Musculoskeletal: Scoliosis and degenerative change in the spine. There are no acute or suspicious osseous abnormalities. IMPRESSION: 1. Repair of prior hiatal hernia. There is mild wall thickening of the distal esophagus, no recurrent hernia. 2. 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 3. Wall thickening at the junction of the descending and sigmoid colon versus nondistention. Up-to-date colonoscopy is recommended. 4. Mild left renal atrophy. Aortic Atherosclerosis (ICD10-I70.0). Electronically Signed   By: Andrea Gasman M.D.   On: 11/25/2023 18:56    Micro Results   Recent Results (from the past 240 hours)  MRSA Next Gen by PCR, Nasal     Status: Abnormal   Collection Time: 11/30/23 12:52 AM   Specimen: Nasal Mucosa; Nasal Swab  Result Value Ref Range Status   MRSA by PCR Next Gen DETECTED (A) NOT DETECTED Final    Comment: RESULT CALLED TO, READ BACK BY AND VERIFIED WITH: NAMIRA RONE [phone] [id number], VIRAY,J (NOTE) The GeneXpert MRSA Assay (FDA approved for NASAL specimens only), is one component of a comprehensive MRSA colonization surveillance program. It is not intended to diagnose MRSA infection nor to guide or monitor treatment for MRSA infections. Test performance is not FDA approved in patients less than 27 years old. Performed at [facility], [address], [place], [place] [address]     Today   Subjective    Dana Romero today has no new complaints No fever  Or chills  - No further diarrhea - No further emesis        - Eating and drinking well - Husband at bedside, questions answered   Patient has been seen and examined prior to discharge   Objective   Blood pressure (!) 179/74, pulse 78, temperature 97.9 F (36.6 C), temperature source Oral, resp. rate 14, height 5' 4 (1.626 m), weight 64.2 kg, last menstrual period 03/15/2014, SpO2 99%.   Intake/Output Summary (Last 24 hours) at 12/01/2023 1416 Last data filed at  12/01/2023 1248 Gross per 24 hour  Intake 960 ml  Output --  Net 960 ml    Exam Gen:- Awake Alert, no acute distress  HEENT:- [place].AT, No sclera  icterus Neck-Supple Neck,No JVD,.  Lungs-  CTAB , good air movement bilaterally CV- S1, S2 normal, regular Abd-  +ve B.Sounds, Abd Soft, No tenderness,    Extremity/Skin:- No  edema,   good pulses Psych-affect is appropriate, oriented x3 Neuro-no new focal deficits, no tremors    Data Review   CBC w Diff:  Lab Results  Component Value Date   WBC 9.2 11/30/2023   HGB 11.2 (L) 11/30/2023   HGB 13.1 04/18/2016   HCT 34.6 (L) 11/30/2023   HCT 41.1 04/18/2016   PLT 208 11/30/2023   PLT 333 04/18/2016   LYMPHOPCT 26 07/11/2023   BANDSPCT 1 02/02/2023   MONOPCT 7 07/11/2023   EOSPCT 4 07/11/2023   BASOPCT 1 07/11/2023    CMP:  Lab Results  Component Value Date   NA 138 12/01/2023   NA 138 04/18/2016   K 4.2 12/01/2023   CL 109 12/01/2023   CO2 18 (L) 12/01/2023   BUN 7 (L) 12/01/2023   BUN 26 04/18/2016   CREATININE 1.20 (H) 12/01/2023   CREATININE 1.46 (H) 07/19/2022   PROT 6.8 11/29/2023   PROT 7.0 04/18/2016   ALBUMIN 2.9 (L) 12/01/2023   ALBUMIN 4.3 04/18/2016   BILITOT 1.5 (H) 11/29/2023   BILITOT <0.2 04/18/2016   ALKPHOS 203 (H) 11/29/2023   AST 87 (H) 11/29/2023   ALT 26 11/29/2023  .  Total Discharge time is about 33 minutes  Rendall Carwin M.D on 12/01/2023 at 2:16 PM  Go to [email or web address] -  for contact info  [facility] - Office  [phone]

## 2023-12-27 ENCOUNTER — Encounter (HOSPITAL_COMMUNITY): Payer: Self-pay | Admitting: Emergency Medicine

## 2023-12-27 ENCOUNTER — Other Ambulatory Visit: Payer: Self-pay

## 2023-12-27 ENCOUNTER — Emergency Department (HOSPITAL_COMMUNITY)
Admission: EM | Admit: 2023-12-27 | Discharge: 2023-12-27 | Disposition: A | Discharge status: Home / Self Care | Source: Other Acute Inpatient Hospital | Attending: Emergency Medicine | Admitting: Emergency Medicine

## 2023-12-27 ENCOUNTER — Emergency Department (HOSPITAL_COMMUNITY)

## 2023-12-27 DIAGNOSIS — W1809XA Striking against other object with subsequent fall, initial encounter: Secondary | ICD-10-CM | POA: Insufficient documentation

## 2023-12-27 DIAGNOSIS — S0990XA Unspecified injury of head, initial encounter: Secondary | ICD-10-CM | POA: Diagnosis not present

## 2023-12-27 DIAGNOSIS — Z91040 Latex allergy status: Secondary | ICD-10-CM | POA: Diagnosis not present

## 2023-12-27 DIAGNOSIS — W19XXXA Unspecified fall, initial encounter: Secondary | ICD-10-CM

## 2023-12-27 DIAGNOSIS — S40011A Contusion of right shoulder, initial encounter: Secondary | ICD-10-CM | POA: Diagnosis not present

## 2023-12-27 DIAGNOSIS — S60211A Contusion of right wrist, initial encounter: Secondary | ICD-10-CM | POA: Diagnosis not present

## 2023-12-27 DIAGNOSIS — S4991XA Unspecified injury of right shoulder and upper arm, initial encounter: Secondary | ICD-10-CM | POA: Diagnosis present

## 2023-12-27 LAB — CBC
HCT: 39.2 % (ref 36.0–46.0)
Hemoglobin: 12.9 g/dL (ref 12.0–15.0)
MCH: 28.5 pg (ref 26.0–34.0)
MCHC: 32.9 g/dL (ref 30.0–36.0)
MCV: 86.7 fL (ref 80.0–100.0)
Platelets: 271 K/uL (ref 150–400)
RBC: 4.52 MIL/uL (ref 3.87–5.11)
RDW: 15.2 % (ref 11.5–15.5)
WBC: 12.4 K/uL — ABNORMAL HIGH (ref 4.0–10.5)
nRBC: 0 % (ref 0.0–0.2)

## 2023-12-27 LAB — COMPREHENSIVE METABOLIC PANEL WITH GFR
ALT: 26 U/L (ref 0–44)
AST: 61 U/L — ABNORMAL HIGH (ref 15–41)
Albumin: 3.1 g/dL — ABNORMAL LOW (ref 3.5–5.0)
Alkaline Phosphatase: 115 U/L (ref 38–126)
Anion gap: 15 (ref 5–15)
BUN: 12 mg/dL (ref 8–23)
CO2: 19 mmol/L — ABNORMAL LOW (ref 22–32)
Calcium: 8.7 mg/dL — ABNORMAL LOW (ref 8.9–10.3)
Chloride: 102 mmol/L (ref 98–111)
Creatinine, Ser: 1.55 mg/dL — ABNORMAL HIGH (ref 0.44–1.00)
GFR, Estimated: 35 mL/min — ABNORMAL LOW (ref >60)
Glucose, Bld: 263 mg/dL — ABNORMAL HIGH (ref 70–99)
Potassium: 3.9 mmol/L (ref 3.5–5.1)
Sodium: 136 mmol/L (ref 135–145)
Total Bilirubin: 1.2 mg/dL (ref 0.0–1.2)
Total Protein: 6.7 g/dL (ref 6.5–8.1)

## 2023-12-27 LAB — LIPASE, BLOOD: Lipase: 52 U/L — ABNORMAL HIGH (ref 11–51)

## 2023-12-27 MED ORDER — ONDANSETRON 4 MG PO TBDP
ORAL_TABLET | ORAL | Status: DC
Start: 2023-12-27 — End: 2023-12-28
  Filled 2023-12-27: qty 1

## 2023-12-27 MED ORDER — HYDROCODONE-ACETAMINOPHEN 5-325 MG PO TABS: 1.0000 | ORAL_TABLET | Freq: Four times a day (QID) | ORAL | 0 refills | Status: AC | PRN

## 2023-12-27 MED ORDER — ONDANSETRON 4 MG PO TBDP
ORAL_TABLET | ORAL | 0 refills | Status: AC
Start: 2023-12-27 — End: ?

## 2023-12-27 MED ORDER — ONDANSETRON 4 MG PO TBDP
4.0000 mg | ORAL_TABLET | Freq: Once | ORAL | Status: AC
  Administered 2023-12-27: 4 mg via ORAL
  Filled 2023-12-27: qty 1

## 2023-12-27 MED ORDER — OXYCODONE-ACETAMINOPHEN 5-325 MG PO TABS
1.0000 | ORAL_TABLET | Freq: Once | ORAL | Status: AC
  Administered 2023-12-27: 1 via ORAL
  Filled 2023-12-27: qty 1

## 2023-12-27 NOTE — ED Notes (Signed)
 Patient states she having abdominal pain with diarrhea and nausea x 5-6 weeks.

## 2023-12-27 NOTE — ED Notes (Signed)
 Pt was given some ice.

## 2023-12-27 NOTE — Discharge Instructions (Addendum)
 Follow-up with your family doctor next week

## 2023-12-27 NOTE — ED Notes (Signed)
 Pt ambulate to the restroom with assistant

## 2023-12-27 NOTE — ED Triage Notes (Signed)
 Pt arrived by [facility] c/o a fall, denies LOC, denies use of blood thinners, endorses nausea

## 2023-12-28 NOTE — ED Provider Notes (Signed)
 [place] EMERGENCY DEPARTMENT AT [facility] Provider Note   CSN: [id number] Arrival date & time: 12/27/23  1733     Patient presents with: Dana Romero is a 73 y.o. female.   Patient fell and complains of right shoulder and right wrist pain.  She also hit her head.  No loss of consciousness  The history is provided by the patient and medical records. No language interpreter was used.  Fall This is a new problem. The current episode started 3 to 5 hours ago. The problem occurs rarely. The problem has been resolved. Pertinent negatives include no chest pain, no abdominal pain and no headaches. The symptoms are aggravated by walking. She has tried nothing for the symptoms.       Prior to Admission medications   Medication Sig Start Date End Date Taking? Authorizing Provider  HYDROcodone -acetaminophen  (NORCO/VICODIN) 5-325 MG tablet Take 1 tablet by mouth every 6 (six) hours as needed. 12/27/23  Yes Kairah Leoni, MD  ondansetron  (ZOFRAN -ODT) 4 MG disintegrating tablet 4mg  ODT q4 hours prn nausea/vomit 12/27/23  Yes Americo Vallery, MD  ADVAIR HFA 115-21 MCG/ACT inhaler INHALE 2 PUFFS INTO LUNGS TWICE DAILY Patient taking differently: Inhale 2 puffs into the lungs 2 (two) times daily as needed (for shortness of breath or wheezing- flares). 04/05/17   Claudene Rayfield HERO, MD  albuterol  (PROVENTIL ) (2.5 MG/3ML) 0.083% nebulizer solution Take 3 mLs (2.5 mg total) by nebulization every 6 (six) hours as needed for wheezing or shortness of breath. 12/01/23   Emokpae, Courage, MD  albuterol  (VENTOLIN  HFA) 108 (90 Base) MCG/ACT inhaler INHALE ONE TO TWO PUFFS BY MOUTH EVERY 6 HOURS AS NEEDED FOR WHEEZING OR SHORTNESS OF BREATH (OR PERSISTENT COUGHING) 12/01/23   Emokpae, Courage, MD  ALPRAZolam  (XANAX ) 1 MG tablet Take 1 mg by mouth in the morning and at bedtime. 02/28/21   [provider]  ARIPiprazole  (ABILIFY ) 15 MG tablet Take 15 mg by mouth at bedtime.    [provider]  Ascorbic Acid (VITAMIN C PO) Take 1,000 mg by mouth in the morning.    [provider]  carvedilol  (COREG  CR) 40 MG 24 hr capsule Take 1 capsule (40 mg total) by mouth daily. 12/01/23   Pearlean Manus, MD  Cholecalciferol (VITAMIN D3) 50 MCG (2000 UT) TABS Take 2,000 Units by mouth daily.    [provider]  cyanocobalamin (VITAMIN B12) 1000 MCG tablet Take 1,000 mcg by mouth daily.    [provider]  diphenhydrAMINE  (BENADRYL ) 50 MG capsule Take 50 mg by mouth at bedtime.    [provider]  EPINEPHrine  (EPIPEN  2-PAK) 0.3 mg/0.3 mL IJ SOAJ injection Inject 0.3 mLs (0.3 mg total) into the muscle once. Patient taking differently: Inject 0.3 mg into the muscle once as needed for anaphylaxis. 01/07/15   Smith, Kristi M, MD  estradiol  (VIVELLE -DOT) 0.05 MG/24HR patch Place 1 patch onto the skin 2 (two) times a week. 06/03/21   [provider]  FLUoxetine  (PROZAC ) 40 MG capsule Take 80 mg by mouth at bedtime.    [provider]  fluticasone  (FLONASE ) 50 MCG/ACT nasal spray Place 2 sprays into both nostrils daily as needed for allergies or rhinitis.    [provider]  glucose blood test strip Test blood sugar daily.  DX: E11.9 04/06/15   Claudene Rayfield HERO, MD  magnesium  oxide (MAG-OX) 400 MG tablet Take 400 mg by mouth See admin instructions. Take 400 mg by mouth at 10  AM    [provider]  meclizine  (ANTIVERT ) 25 MG tablet Take 25 mg by mouth 3 (three) times daily as needed for dizziness or nausea. 06/28/20   [provider]  medroxyPROGESTERone  (PROVERA ) 2.5 MG tablet TAKE ONE TABLET BY MOUTH ONCE DAILY Patient taking differently: Take 2.5 mg by mouth at bedtime. 10/14/15   Claudene Rayfield HERO, MD  Menthol, Topical Analgesic, (BIOFREEZE) 4 % GEL Apply 1 application  topically as needed (pain- affected areas).    [provider]  Multiple Vitamin (MULTIVITAMIN WITH MINERALS) TABS tablet Take 1 tablet by  mouth daily after breakfast.    [provider]  naloxone (NARCAN) nasal spray 4 mg/0.1 mL Place 1 spray into the nose See admin instructions. Instill 1 spray into the nose every 3 minutes until awake of EMS arrives- for an accidental opioid overdose 07/13/23   [provider]  olmesartan  (BENICAR ) 40 MG tablet Take 1 tablet (40 mg total) by mouth daily. 12/01/23   Pearlean Manus, MD  oxyCODONE  (OXY IR/ROXICODONE ) 5 MG immediate release tablet Take 1 tablet (5 mg total) by mouth every 6 (six) hours as needed for severe pain (pain score 7-10). 07/18/23   Kinsinger, Herlene Righter, MD  oxyCODONE -acetaminophen  (PERCOCET) 10-325 MG tablet Take 1 tablet by mouth See admin instructions. Take 1 tablet by mouth in the morning, at noontime, and at bedtime- may take 1 additional tablet once a day as needed for pain 03/29/22   [provider]  pantoprazole  (PROTONIX ) 40 MG tablet TAKE 1 TABLET (40 MG TOTAL) BY MOUTH TWICE A DAY BEFORE MEALS Patient taking differently: Take 40 mg by mouth 2 (two) times daily before a meal. 01/26/23   Rudy, Josette RAMAN, PA-C  Propylene Glycol (SYSTANE BALANCE) 0.6 % SOLN Place 1 drop into both eyes in the morning, at noon, and at bedtime.    [provider]  ZYRTEC  ALLERGY 10 MG tablet Take 10 mg by mouth at bedtime.    [provider]    Allergies: Pregabalin, Amlodipine besylate, Atorvastatin, Clopidogrel, Ezetimibe -simvastatin, Losartan  potassium-hctz, Losartan  potassium-hctz, Morphine, Niacin, Rosuvastatin, Semaglutide, Statins, Toprol xl [metoprolol succinate], Valsartan, Aspirin, Diazepam , and Latex    Review of Systems  Constitutional:  Negative for appetite change and fatigue.  HENT:  Negative for congestion, ear discharge and sinus pressure.   Eyes:  Negative for discharge.  Respiratory:  Negative for cough.   Cardiovascular:  Negative for chest pain.  Gastrointestinal:  Negative for abdominal pain and diarrhea.  Genitourinary:   Negative for frequency and hematuria.  Musculoskeletal:  Negative for back pain.       Shoulder pain  Skin:  Negative for rash.  Neurological:  Negative for seizures and headaches.  Psychiatric/Behavioral:  Negative for hallucinations.     Updated Vital Signs BP (!) 149/66   Pulse 91   Temp 98.4 F (36.9 C) (Oral)   Resp 12   Ht 5' 4 (1.626 m)   Wt 63.5 kg   LMP 03/15/2014   SpO2 98%   BMI 24.03 kg/m   Physical Exam Vitals and nursing note reviewed.  Constitutional:      Appearance: She is well-developed.  HENT:     Head: Normocephalic.     Nose: Nose normal.  Eyes:     General: No scleral icterus.    Conjunctiva/sclera: Conjunctivae normal.  Neck:     Thyroid : No thyromegaly.  Cardiovascular:     Rate and Rhythm: Normal rate and regular rhythm.  Heart sounds: No murmur heard.    No friction rub. No gallop.  Pulmonary:     Breath sounds: No stridor. No wheezing or rales.  Chest:     Chest wall: No tenderness.  Abdominal:     General: There is no distension.     Tenderness: There is no abdominal tenderness. There is no rebound.  Musculoskeletal:     Cervical back: Neck supple.     Comments: Tender right shoulder at wrist with decreased range of motion  Lymphadenopathy:     Cervical: No cervical adenopathy.  Skin:    Findings: No erythema or rash.  Neurological:     Mental Status: She is alert and oriented to person, place, and time.     Motor: No abnormal muscle tone.     Coordination: Coordination normal.  Psychiatric:        Behavior: Behavior normal.     (all labs ordered are listed, but only abnormal results are displayed) Labs Reviewed  LIPASE, BLOOD - Abnormal; Notable for the following components:      Result Value   Lipase 52 (*)    All other components within normal limits  COMPREHENSIVE METABOLIC PANEL WITH GFR - Abnormal; Notable for the following components:   CO2 19 (*)    Glucose, Bld 263 (*)    Creatinine, Ser 1.55 (*)    Calcium  8.7 (*)    Albumin 3.1 (*)    AST 61 (*)    GFR, Estimated 35 (*)    All other components within normal limits  CBC - Abnormal; Notable for the following components:   WBC 12.4 (*)    All other components within normal limits    EKG: None  Radiology: DG Shoulder Right Result Date: 12/27/2023 CLINICAL DATA:  Fall EXAM: RIGHT SHOULDER - 2+ VIEW COMPARISON:  01/17/2023 FINDINGS: No fracture or malalignment.  Imaged right lung is clear. IMPRESSION: No acute osseous abnormality. Electronically Signed   By: Luke Bun M.D.   On: 12/27/2023 19:42   DG Wrist Complete Right Result Date: 12/27/2023 CLINICAL DATA:  Fall EXAM: RIGHT WRIST - COMPLETE 3+ VIEW COMPARISON:  None Available. FINDINGS: No fracture or malalignment. Mild degenerative change at the first [facility] joint and STT interval. IMPRESSION: No acute osseous abnormality. Electronically Signed   By: Luke Bun M.D.   On: 12/27/2023 19:40   CT Cervical Spine Wo Contrast Result Date: 12/27/2023 CLINICAL DATA:  Neck trauma (Age >= 65y) Fall. EXAM: CT CERVICAL SPINE WITHOUT CONTRAST TECHNIQUE: Multidetector CT imaging of the cervical spine was performed without intravenous contrast. Multiplanar CT image reconstructions were also generated. RADIATION DOSE REDUCTION: This exam was performed according to the departmental dose-optimization program which includes automated exposure control, adjustment of the mA and/or kV according to patient size and/or use of iterative reconstruction technique. COMPARISON:  01/17/2023 FINDINGS: Alignment: No traumatic subluxation. Trace retrolisthesis of C4 on C5, unchanged from prior. Skull base and vertebrae: No acute fracture. The previous C1 fracture is not seen on the current exam. Vertebral body heights are maintained. The dens and skull base are intact. Stable benign lucency in the dens. Soft tissues and spinal canal: No prevertebral fluid or swelling. No visible canal hematoma. Disc levels: Stable degenerative  disc disease and facet hypertrophy. No high-grade canal stenosis. Upper chest: Biapical pleuroparenchymal scarring. No acute findings. Other: None. IMPRESSION: 1. No acute fracture or traumatic subluxation of the cervical spine. 2. Stable degenerative disc disease and facet hypertrophy. Electronically Signed  By: Andrea Gasman M.D.   On: 12/27/2023 19:17   CT Head Wo Contrast Result Date: 12/27/2023 CLINICAL DATA:  Head trauma, minor (Age >= 65y) Fall. EXAM: CT HEAD WITHOUT CONTRAST TECHNIQUE: Contiguous axial images were obtained from the base of the skull through the vertex without intravenous contrast. RADIATION DOSE REDUCTION: This exam was performed according to the departmental dose-optimization program which includes automated exposure control, adjustment of the mA and/or kV according to patient size and/or use of iterative reconstruction technique. COMPARISON:  CT 01/17/2023 FINDINGS: Brain: No intracranial hemorrhage, mass effect, or midline shift. Age related atrophy. No hydrocephalus. The basilar cisterns are patent. Mild to moderate periventricular and deep white matter hypodensity typical of chronic small vessel ischemia. No evidence of territorial infarct or acute ischemia. No extra-axial or intracranial fluid collection. Vascular: No hyperdense vessel or unexpected calcification. Skull: No fracture or focal lesion. Sinuses/Orbits: Paranasal sinuses and mastoid air cells are clear. The visualized orbits are unremarkable. Bilateral cataract resection. Other: Minimal right parietal scalp hematoma. IMPRESSION: 1. No acute intracranial abnormality. No skull fracture. 2. Age related atrophy and chronic small vessel ischemia. Electronically Signed   By: Andrea Gasman M.D.   On: 12/27/2023 19:13     Procedures   Medications Ordered in the ED  oxyCODONE -acetaminophen  (PERCOCET/ROXICET) 5-325 MG per tablet 1 tablet (1 tablet Oral Given 12/27/23 2033)  ondansetron  (ZOFRAN -ODT) disintegrating  tablet 4 mg (4 mg Oral Given 12/27/23 2115)                                    Medical Decision Making Amount and/or Complexity of Data Reviewed Labs: ordered. Radiology: ordered.  Risk Prescription drug management.   Patient with a fall with contusion to shoulder on the right and right wrist.  Also minor head injury.  She will follow-up with her PCP and is given some pain medicine and nausea medicine     Final diagnoses:  Fall, initial encounter    ED Discharge Orders          Ordered    ondansetron  (ZOFRAN -ODT) 4 MG disintegrating tablet        12/27/23 2009    HYDROcodone -acetaminophen  (NORCO/VICODIN) 5-325 MG tablet  Every 6 hours PRN        12/27/23 2009               Jontez Redfield, MD 12/28/23 1200

## 2024-02-18 NOTE — ED Provider Notes (Signed)
 1850 hrs. Saw and examined patient as attending physician briefly patient is a 73 year old with complaints of fever increasing delirium CAT scan showing colitis due to fever without clear source will go ahead bring the patient in treatment general supportive care for fever and colitis workup is negative patient resting comfortably normal lives at home with family

## 2024-02-18 NOTE — ED Provider Notes (Signed)
 ------------------------------------------------------------------------------- Attestation signed by Cherie Ardeen Hanger, MD at 02/19/24 1512 I was the attending physician on duty, and was available in Emergency Department for any consultations. I did not personally care for this patient. The patient was managed by the mid-level provider. I am co-signing the chart as the attending physician.   Signed by Ardeen SHAUNNA Cherie, MD February 19, 2024 at 3:12 PM   -------------------------------------------------------------------------------                                                                                     Emergency Department Provider Note    ED Clinical Impression   Final diagnoses:  Fever, unspecified fever cause  Nausea and vomiting, unspecified vomiting type  Ileus    (CMS-HCC) (Primary)    ED Assessment/Plan    Condition: Stable Disposition: Admit  This chart has been completed using Dragon Medical Dictation software, and while attempts have been made to ensure accuracy, certain words and phrases may not be transcribed as intended.   History   Chief Complaint  Patient presents with  . Weakness   HPI  Dana Romero is a 73 y.o. female  who presents today to the  emergency department complaining of symptoms ongoing several days, she states I'm here for the same thing I'm always here for.  She complains of nausea with vomiting, all over body pain and weakness.  She was seen here about 1 week ago after a fall, and she states she has had symptoms since then.  She denies any fever, chills, abdominal pain or change in bowel/bladder habits. She does not appear to be in distress during exam.      Allergies: is allergic to pregabalin, statins-hmg-coa reductase inhibitors, amlodipine besylate, clopidogrel, diazepam , ezetimibe -simvastatin, losartan -hydrochlorothiazide, metoprolol succinate, morphine, niacin, other, rosuvastatin, valsartan, aspirin, latex, and  semaglutide. Medications: has a current medication list which includes the following long-term medication(s): accu-chek guide me glucose mtr, albuterol , carvedilol , carvedilol , fluoxetine , olmesartan , epinephrine , famotidine, fluticasone  propion-salmeterol, and fluticasone  propionate. PMHx:  has a past medical history of Arthritis, Asthma (HHS-HCC), CAP (community acquired pneumonia) (01/18/2023), Diabetes mellitus (CMS-HCC), and Hypertension. PSHx:  has a past surgical history that includes Breast biopsy (Left, 11/03/2020); Appendectomy; Breast cyst excision (Bilateral); and exploratory stomach surgery. SocHx:  reports that she has never smoked. She has never used smokeless tobacco. She reports current alcohol  use. She reports current drug use. Drug: Oxycodone . Allergies, Medications, Medical, Surgical, and Social History were reviewed as documented above.   Social Drivers of Health with Concerns   Food Insecurity: Food Insecurity Present (02/18/2024)   Hunger Vital Sign   . Worried About Programme Researcher, Broadcasting/film/video in the Last Year: Sometimes true   . Ran Out of Food in the Last Year: Never true  Transportation Needs: Unmet Transportation Needs (02/18/2024)   PRAPARE - Transportation   . Lack of Transportation (Medical): Yes   . Lack of Transportation (Non-Medical): Yes  Physical Activity: Inactive (02/18/2024)   Exercise Vital Sign   . Days of Exercise per Week: 0 days   . Minutes of Exercise per Session: 0 min  Utilities: High Risk (02/18/2024)   Utilities   . Within the past  12 months, have you been unable to get utilities (heat, electricity) when it was really needed?: Yes  Stress: Stress Concern Present (02/18/2024)   [employer] of Occupational Health - Occupational Stress Questionnaire   . Feeling of Stress: Very much  Social Connections: Moderately Isolated (02/18/2024)   Social Connection and Isolation Panel   . Frequency of Communication with Friends and Family: Three times a  week   . Frequency of Social Gatherings with Friends and Family: Once a week   . Attends Religious Services: Never   . Active Member of Clubs or Organizations: No   . Attends Banker Meetings: Never   . Marital Status: Living with partner  Financial Resource Strain: Medium Risk (02/18/2024)   Overall Financial Resource Strain (CARDIA)   . Difficulty of Paying Living Expenses: Somewhat hard  Health Literacy: Medium Risk (02/18/2024)   Health Literacy   . : Rarely     Review Of Systems  Review of Systems  Constitutional:  Negative for chills and fever.  Respiratory:  Negative for shortness of breath.   Cardiovascular:  Negative for chest pain.  Gastrointestinal:  Positive for nausea and vomiting. Negative for abdominal pain and diarrhea.  Genitourinary:  Negative for dysuria.  Musculoskeletal:  Positive for arthralgias and back pain.  Neurological:  Positive for weakness. Negative for dizziness and headaches.    Physical Exam   BP 113/56   Pulse 80   Temp 36.9 C (98.4 F) (Oral)   Resp 15   Ht 162.6 cm (5' 4)   Wt 59 kg (130 lb)   SpO2 93%   BMI 22.31 kg/m   Physical Exam Vitals and nursing note reviewed.  Constitutional:      Appearance: Normal appearance. She is normal weight.  HENT:     Mouth/Throat:     Mouth: Mucous membranes are moist.  Cardiovascular:     Rate and Rhythm: Normal rate and regular rhythm.     Heart sounds: Normal heart sounds.  Pulmonary:     Effort: Pulmonary effort is normal.     Breath sounds: Normal breath sounds.  Abdominal:     General: Bowel sounds are normal.     Palpations: Abdomen is soft.     Tenderness: There is no abdominal tenderness.  Skin:    General: Skin is warm and dry.     Capillary Refill: Capillary refill takes 2 to 3 seconds.  Neurological:     General: No focal deficit present.     Mental Status: She is alert and oriented to person, place, and time.  Psychiatric:        Mood and Affect: Mood  normal.        Behavior: Behavior normal.     ED Course  Medical Decision Making Amount and/or Complexity of Data Reviewed Labs: ordered. Decision-making details documented in ED Course. Radiology: ordered. Decision-making details documented in ED Course. ECG/medicine tests: ordered. Decision-making details documented in ED Course.  Risk OTC drugs. Prescription drug management. Decision regarding hospitalization.    ED Course as of 02/18/24 2140  Mon Feb 18, 2024  1606 XR Abdomen 2 Views and Chest 1 View Images and report reviewed:  IMPRESSION: The supine views of the abdomen are limited by underpenetration. Gas-distended loops of bowel in the left abdomen are indeterminate for colon versus dilated small bowel loops. In the setting of nausea, consider further evaluation with CT. No pneumoperitoneum. Clear lungs.   1651 CBC w/ Differential(!):   WBC 13.8(!)  RBC 4.16  HGB 11.6  HCT 34.5  MCV 82.9  MCH 27.9  MCHC 33.6  RDW 14.2  MPV 10.2  Platelet 299  Neutrophils % 77.7  Lymphocytes % 8.7  Monocytes % 12.4  Eosinophils % 0.0  Basophils % 0.4  Absolute Neutrophils 10.7(!)  Absolute Lymphocytes 1.2  Absolute Monocytes  1.7(!)  Absolute Eosinophils 0.0  Absolute Basophils  0.1  1706 Influenza/ RSV/COVID PCR:   SARS-CoV-2 PCR Negative  Influenza A Negative  Influenza B Negative  RSV Negative  1715 ECG 12 Lead NSR 90 BPM< LAD, no acute changes  1719 hsTroponin I (serial 0-2-6H w/ delta):   hsTroponin I 10  1720 Urinalysis with Microscopy with Culture Reflex (Clean Catch)(!):   Color, UA Yellow  Clarity, UA Clear  Spec Grav, UA 1.016  pH, UA 6.5  Leukocyte Esterase, UA Negative  Nitrite, UA Negative  Protein, UA Trace(!)  Glucose, UA 100 mg/dL(!)  Ketones, UA Trace(!)  Urobilinogen, UA <2.0 mg/dL  Bilirubin, UA Negative  Blood, UA Negative  RBC, UA 1  WBC, UA 1  Squam Epithel, UA <1  Bacteria, UA None Seen  WBC Clumps None Seen  Hyphal Yeast None Seen   Yeast, UA None Seen  1817 hsTroponin I - 2 Hour:   hsTroponin I 10  delta hsTroponin I 0 Low risk  1831 CT Abdomen Pelvis W IV Contrast Images and report reviewed:  IMPRESSION: 1.    Multiple gas-filled loops of small bowel with a few scattered air-fluid levels. This is nonspecific but may indicate mild enteritis or ileus.   2.    Moderate fecal material throughout the colon.   3.    Wall thickening distal esophagus, likely reflux esophagitis.   4.    Cholecystectomy. Prominence of the intrahepatic and extra hepatic biliary tree, unchanged compared with the prior study.   1831 Contacting hospitalist for admission     Procedures   Encounter Date: 02/18/24  ECG 12 Lead  Result Value   EKG Systolic BP    EKG Diastolic BP    EKG Ventricular Rate 90   EKG Atrial Rate 90   EKG P-R Interval 138   EKG QRS Duration 84   EKG Q-T Interval 414   EKG QTC Calculation 506   EKG Calculated P Axis 61   EKG Calculated R Axis -31   EKG Calculated T Axis 20   QTC Fredericia 474     ED Results Results for orders placed or performed during the hospital encounter of 02/18/24  Influenza/ RSV/COVID PCR   Specimen: Nasopharyngeal Swab  Result Value Ref Range   SARS-CoV-2 PCR Negative Negative   Influenza A Negative Negative   Influenza B Negative Negative   RSV Negative Negative  Comprehensive Metabolic Panel  Result Value Ref Range   Sodium 138 135 - 145 mmol/L   Potassium 3.8 3.5 - 5.0 mmol/L   Chloride 104 98 - 107 mmol/L   CO2 25.9 21.0 - 32.0 mmol/L   Anion Gap 8 3 - 11 mmol/L   BUN 13 8 - 20 mg/dL   Creatinine 8.83 (H) 9.39 - 1.10 mg/dL   BUN/Creatinine Ratio 11    eGFR CKD-EPI (2021) Female 50 (L) >=60 mL/min/1.71m2   Glucose 139 70 - 179 mg/dL   Calcium 8.2 (L) 8.5 - 10.1 mg/dL   Albumin 2.7 (L) 3.5 - 5.0 g/dL   Total Protein 6.4 6.0 - 8.0 g/dL   Total Bilirubin 0.9 0.3 - 1.2 mg/dL   AST  50 (H) 15 - 40 U/L   ALT 23 12 - 78 U/L   Alkaline Phosphatase 107 46 - 116 U/L   Lipase  Result Value Ref Range   Lipase 18 16 - 77 U/L  hsTroponin I (serial 0-2-6H w/ delta)  Result Value Ref Range   hsTroponin I 10 <=34 ng/L  hsTroponin I - 2 Hour  Result Value Ref Range   hsTroponin I 10 <=34 ng/L   delta hsTroponin I 0 <=7 ng/L  ECG 12 Lead  Result Value Ref Range   EKG Systolic BP  mmHg   EKG Diastolic BP  mmHg   EKG Ventricular Rate 90 BPM   EKG Atrial Rate 90 BPM   EKG P-R Interval 138 ms   EKG QRS Duration 84 ms   EKG Q-T Interval 414 ms   EKG QTC Calculation 506 ms   EKG Calculated P Axis 61 degrees   EKG Calculated R Axis -31 degrees   EKG Calculated T Axis 20 degrees   QTC Fredericia 474 ms  POCT Glucose  Result Value Ref Range   Glucose, POC 115 (H) 70 - 105 mg/dL  CBC w/ Differential  Result Value Ref Range   WBC 13.8 (H) 4.0 - 10.5 10*9/L   RBC 4.16 3.80 - 5.10 10*12/L   HGB 11.6 11.5 - 15.0 g/dL   HCT 65.4 65.9 - 55.9 %   MCV 82.9 80.0 - 98.0 fL   MCH 27.9 27.0 - 34.0 pg   MCHC 33.6 32.0 - 36.0 g/dL   RDW 85.7 88.4 - 85.4 %   MPV 10.2 7.4 - 10.4 fL   Platelet 299 140 - 415 10*9/L   Neutrophils % 77.7 %   Lymphocytes % 8.7 %   Monocytes % 12.4 %   Eosinophils % 0.0 %   Basophils % 0.4 %   Absolute Neutrophils 10.7 (H) 1.8 - 7.8 10*9/L   Absolute Lymphocytes 1.2 0.7 - 4.5 10*9/L   Absolute Monocytes 1.7 (H) 0.1 - 1.0 10*9/L   Absolute Eosinophils 0.0 0.0 - 0.4 10*9/L   Absolute Basophils 0.1 0.0 - 0.2 10*9/L  Urinalysis with Microscopy with Culture Reflex  Result Value Ref Range   Color, UA Yellow    Clarity, UA Clear Clear   Specific Gravity, UA 1.016 1.010 - 1.025   pH, UA 6.5 5.0 - 8.0   Leukocyte Esterase, UA Negative Negative   Nitrite, UA Negative Negative   Protein, UA Trace (A) Negative   Glucose, UA 100 mg/dL (A) Negative, Trace   Ketones, UA Trace (A) Negative   Urobilinogen, UA <2.0 mg/dL <7.9 mg/dL   Bilirubin, UA Negative Negative   Blood, UA Negative Negative   RBC, UA 1 0 - 3 /HPF   WBC, UA 1 0 - 3 /HPF    Squam Epithel, UA <1 0 - 10 /HPF   Bacteria, UA None Seen None Seen /HPF   WBC Clumps None Seen None Seen /HPF   Hyphal Yeast None Seen None Seen /HPF   Yeast, UA None Seen None Seen /HPF   CT Abdomen Pelvis W IV Contrast Result Date: 02/18/2024 Exam: CT of the Abdomen and Pelvis with Contrast  History: Abdominal pain.  Technique: After administration of intravenous contrast, contiguous axial images were obtained through the abdomen and pelvis during the portal venous phase. Multiplanar reformats were obtained. AEC (automated exposure control) and/or manual techniques such as size-specific kV and mAs are employed where appropriate to reduce radiation exposure for all CT  exams.  Comparison: Radiographs 02/18/2024    Findings: LOWER CHEST: Bibasilar atelectasis. Cardiomegaly. Wall thickening distal esophagus which may indicate reflux esophagitis.  LIVER: Normal size. Smooth contour. No focal lesion. Patent hepatic vasculature.  BILIARY: Cholecystectomy. Prominence of the intrahepatic and extra hepatic biliary tree, unchanged compared with the prior study. No visualized stones within the common bile duct.  PANCREAS: Atrophic pancreas.  SPLEEN: Normal size.  ADRENALS: Normal morphology.  KIDNEYS: No hydroureteronephrosis. No renal lesion.  GASTROINTESTINAL: Unremarkable stomach. No abnormally dilated loops of small or large bowel. There is moderate fecal material throughout the colon. There are multiple gas-filled loops of small bowel with a few scattered air-fluid levels. This is nonspecific but may indicate mild enteritis or ileus. There is no wall thickening.  PELVIC ORGANS: Unremarkable.  Normal contour of urinary bladder and uterus.  PERITONEUM: No ascites.  LYMPH NODES: No lymphadenopathy.  VASCULATURE: Normal caliber. No significant atherosclerosis.  BONES: No aggressive lesion. No acute abnormality.  SOFT TISSUES: Normal.    1.    Multiple gas-filled loops of small bowel with a few scattered  air-fluid levels. This is nonspecific but may indicate mild enteritis or ileus.  2.    Moderate fecal material throughout the colon.  3.    Wall thickening distal esophagus, likely reflux esophagitis.  4.    Cholecystectomy. Prominence of the intrahepatic and extra hepatic biliary tree, unchanged compared with the prior study.  Signed (Electronic Signature): 02/18/2024 6:23 PM Signed By: Abby Blossom, MD  XR Abdomen 2 Views and Chest 1 View Result Date: 02/18/2024 Exam: Abdomen 2 views, chest one view  History:  Nausea  Technique:  2 views of the abdomen and frontal view of the chest  Comparison:  Chest radiograph 02/10/2024 and earlier dating to 01/18/2023  Findings:  The supine views of the abdomen are limited by underpenetration. There are gas distended loops of bowel in the left abdomen which are indeterminate for colon versus dilated small bowel loops. No evidence of pneumoperitoneum. No abnormal calcifications are visualized. Dextrocurvature of the thoracolumbar spine.  Normal lung volumes. No focal consolidation or pulmonary edema. No demonstrable pleural effusion or pneumothorax. The cardiac silhouette is within normal limits. Unchanged tortuosity of the ascending aorta.    The supine views of the abdomen are limited by underpenetration. Gas-distended loops of bowel in the left abdomen are indeterminate for colon versus dilated small bowel loops. In the setting of nausea, consider further evaluation with CT. No pneumoperitoneum. Clear lungs.    Signed (Electronic Signature): 02/18/2024 3:58 PM Signed By: Hassell Moles, MD   Medications Administered:  Medications  sodium chloride  (NS) 0.9 % infusion (125 mL/hr Intravenous Rate/Dose Verify 02/18/24 2122)  ondansetron  (ZOFRAN ) injection 4 mg (4 mg Intravenous Given 02/18/24 1654)  ketorolac (TORADOL) injection 15 mg (15 mg Intravenous Given 02/18/24 1655)  famotidine (PF) (PEPCID) injection 20 mg (20 mg Intravenous Given 02/18/24 1656)   acetaminophen  (TYLENOL ) tablet 650 mg (650 mg Oral Given 02/18/24 1714)  iohexol  (OMNIPAQUE ) 240 mg iodine /mL oral solution 50 mL (50 mL Oral Given 02/18/24 1615)  iohexol  (OMNIPAQUE ) 300 mg iodine /mL solution 125 mL (125 mL Intravenous Given 02/18/24 1759)  piperacillin-tazobactam (ZOSYN) 3.375 g in sodium chloride  (NS) 0.9 % 100 mL IVPB-VIALMATE (0 g Intravenous Stopped 02/18/24 1938)    Discharge Medications (Medications Prescribed during this  ED visit and Patient's Home Medications) :    Your Medication List     ASK your doctor about these medications    ACCU-CHEK GUIDE ME GLUCOSE  MTR Misc Generic drug: blood-glucose meter   ADVAIR HFA 115-21 mcg/actuation inhaler Generic drug: fluticasone  propion-salmeterol INHALE 2 PUFFS INTO LUNGS TWICE DAILY   albuterol  90 mcg/actuation inhaler Commonly known as: PROVENTIL  HFA;VENTOLIN  HFA Inhale 2 puffs every six (6) hours as needed. Used 5 days ago   amitriptyline  25 MG tablet Commonly known as: ELAVIL  Take 1 tablet (25 mg total) by mouth nightly.   aripiprazole  15 MG tablet Commonly known as: ABILIFY  Take 1 tablet (15 mg total) by mouth daily.   aspirin 81 MG tablet Commonly known as: ECOTRIN Take 1 tablet (81 mg total) by mouth daily.   BIOFREEZE (MENTHOL) 4 % Gel Generic drug: menthol Apply topically.   carvedilol  25 MG tablet Commonly known as: COREG  Take 1 tablet (25 mg total) by mouth two (2) times a day.   carvedilol  40 MG 24 hr capsule Commonly known as: COREG  CR Take 1 capsule (40 mg total) by mouth daily. TAKE 1 CAPSULE (40 MG TOTAL) BY MOUTH DAILY.   cholecalciferol (vitamin D3-50 mcg (2,000 unit)) 50 mcg (2,000 unit) tablet Take 1 tablet (50 mcg total) by mouth daily.   cyanocobalamin (vitamin B-12) 1000 MCG tablet Take 1 tablet (1,000 mcg total) by mouth.   EPINEPHrine  0.3 mg/0.3 mL injection Commonly known as: EPIPEN  Inject 0.3 mL (0.3 mg total) into the muscle.   esomeprazole  40 MG  capsule Commonly known as: NEXIUM  Take 1 capsule (40 mg total) by mouth.   estradiol  0.075 mg/24 hr Commonly known as: VIVELLE -DOT APPLY 1 PATCH TOPICALLY TWICE A WEEK   ezetimibe  10 mg tablet Commonly known as: ZETIA  Take 1 tablet (10 mg total) by mouth nightly.   famotidine 20 MG tablet Commonly known as: PEPCID Take 1 tablet (20 mg total) by mouth two (2) times a day for 15 days.   FLUoxetine  40 MG capsule Commonly known as: PROZAC  Take 2 capsules (80 mg total) by mouth nightly.   fluticasone  propionate 50 mcg/actuation nasal spray Commonly known as: FLONASE  2 sprays into each nostril daily.   garlic 400 mg Tab Take 400 mg by mouth.   magnesium  oxide 400 mg (241.3 mg elemental) tablet Commonly known as: MAG-OX Take 1 tablet (400 mg total) by mouth daily.   meclizine  25 mg tablet Commonly known as: ANTIVERT  Take 1 tablet (25 mg total) by mouth.   olmesartan  40 MG tablet Commonly known as: BENICAR  Take 1 tablet (40 mg total) by mouth daily. TAKE 1 TABLET (40 MG TOTAL) BY MOUTH DAILY.   ondansetron  4 MG disintegrating tablet Commonly known as: ZOFRAN -ODT DISSOLVE 1 TAB UNDER TONGUE EVERY 4 HOURS AS NEEDED FOR NAUSEA/VOMIT   ONETOUCH ULTRA TEST Strp Generic drug: blood sugar diagnostic 1 each.   pantoprazole  40 MG tablet Commonly known as: Protonix  Take 1 tablet (40 mg total) by mouth daily before breakfast.   REPATHA  SURECLICK 140 mg/mL Pnij Generic drug: evolocumab  INJECT 1 ML (140 MG) SUBCUTANEOUSLY EVERY 14 DAYS   ZyrTEC  10 MG tablet Generic drug: cetirizine  Take 1 tablet (10 mg total) by mouth at bedtime.          Gerome Rosaline Ruth, [place] 02/18/24 2140

## 2024-02-18 NOTE — ED Triage Notes (Signed)
 Patient called for EMS due to weakness and nausea. Patient also complains of back and bilateral shoulder pain that started last night.

## 2024-02-22 NOTE — Discharge Summary (Signed)
 DISCHARGE SUMMARY [facility]   Discharge date:   February 22, 2024 Length of stay:    LOS: 4 days    Discharge Service:   [facility] Hospitalists Discharge Attending Physician: Chiquita Earnie Richards, MD Discharge to:    To Skilled Nursing Facility Condition at Discharge:  good Code status:                         DNR and [facility]   Hospital Course: No notes on file  ______________________________________  HPI from admission  Dana Romero  is a 73 y.o. female with a PMH significant for DM, CKD, HTN, HLD, GERD, asthma, TIA, OA, HFpEF,  who presented with nausea, vomiting, generalized weakness.  Patient has had a difficult recent course, having been hospitalized from syncope, then discharged to [facility] rehab.  She has been at home since 9/30.  Today son reported confusion, weakness.  Patient tells me she is nauseous but denies other complaints such as abdominal pain, vomiting or diarrhea.     In the ED the patient received APAP, famotidine, ketorolac, ondansetron , Zosyn.   Medical admission was requested for further workup and management of ileus/enteritis.  Hospital Course  She was admitted and treated with IV fluids and antiemetics with improvement in her nausea vomiting.  CT imaging showed multiple gas-filled loops of small bowel with few scattered air-fluid levels consistent with mild enteritis or ileus, moderate fecal material throughout the colon.  She continued having normal bowel movements.  Her diet was advanced and she was able to tolerate soft diet.  She was evaluated by physical therapy who recommended short-term rehab at [facility].  She is stable for discharge to SNF and should follow-up with her gastroenterologist and general surgeon.  Assessment and Plan  73 y.o. female with a PMH significant for DM, CKD, HTN, HLD, GERD, asthma, TIA, OA, HFpEF,  now admitted with:   # PO Intolerance, n/v, improving # Ileus / enteritis # Hiatal hernia post repair Mar 2025 # History of Crohns  per patient Intolerance to oral nutrition severe enough to cause confusion, lasting 2 days prior to admission. Confusion resolved, leukocytosis resolved.  CT scan shows multiple gas-filled loops of small bowel with scattered air-fluid levels.  Having bowel movements, not obstructed.  Patient reports her symptoms feel like a typical Crohn's flare.  She also has history of significant hiatal hernia requiring hiatal hernia repair and placement of (internal?) G-tube in March of this year. - received IV fluids - Received Zosyn in the ED, not continued on admission - follow up stool cultures, pending at discharge - pantoprazole  - home reglan  - advance diet to soft - follows with Therisa Stager at [facility], needs outpatient appointment - follows with Dr. Herlene Bureau general surgery, needs outpatient appointment   # CKD stage 3 Correction, previously documented as AKI, appears near baseline CR 1.0-1.3. - Avoid nephrotoxic meds   # HFpEF, chronic - Continue coreg  - hold olmesartan    # History of DM2 Hba1c 7.0 in August - diet controlled  # Asthma - inhalers  # Normocytic anemia, chronic, stable - Outpatient age-appropriate cancer screening   # Mood disorder - resume home alprazolam  and fluoxetine    DVT ppx - lovenox    Dispo: - Recently discharged home from rehab, but PT recommending return to SNF for rehab - Accepted at [facility], authorization approved, [place]  ______________________________________  Mental Status On day of Discharge:  The patient is Alert and oriented to PERSON  The patient is Alert And oriented to TIME The patient is Alert and oriented to LOCATION  CODE STATUS :                    DNR and DNI   An advanced care planning discussion was not  had with patient and/or patient's decisions maker (documented separately).  Patient discharged on Home O2? - no Patient discharged on home anticoagulant? -  no  Foley Catheter status: None Central Line Status: NONE  Time  Spent on Discharge I spent greater than 30 minutes counseling and coordinating care for the discharge of this patient. The patient and I discussed the importance of outpatient follow-up as well as concerning signs and symptoms that would require immediate evaluation by a medical professional. The aforementioned conversation participants understand  and does show insight. The above participant/s is aware that not following the discussed plan, recommendations, and follow up can lead to severe negative effects on the patient's health, up to and including death.  Discharge Medications     Your Medication List     STOP taking these medications    esomeprazole  40 MG capsule Commonly known as: NEXIUM    estradiol  0.075 mg/24 hr Commonly known as: VIVELLE -DOT   famotidine 20 MG tablet Commonly known as: PEPCID   fluticasone  propionate 50 mcg/actuation nasal spray Commonly known as: FLONASE    garlic 400 mg Tab       START taking these medications    EPINEPHrine  0.3 mg/0.3 mL injection Commonly known as: EPIPEN  Inject 0.3 mL (0.3 mg total) into the muscle.   magnesium  oxide 400 mg (241.3 mg elemental) tablet Commonly known as: MAG-OX Take 1 tablet (400 mg total) by mouth daily.   metoclopramide  5 MG tablet Commonly known as: REGLAN  Take 1 tablet (5 mg total) by mouth Three (3) times a day before meals.       CONTINUE taking these medications    ACCU-CHEK GUIDE ME GLUCOSE MTR Misc Generic drug: blood-glucose meter   ADVAIR HFA 115-21 mcg/actuation inhaler Generic drug: fluticasone  propion-salmeterol   albuterol  90 mcg/actuation inhaler Commonly known as: PROVENTIL  HFA;VENTOLIN  HFA Inhale 2 puffs every six (6) hours as needed. Used 5 days ago   BIOFREEZE (MENTHOL) 4 % Gel Generic drug: menthol Apply topically.   carvedilol  25 MG tablet Commonly known as: COREG  Take 1 tablet (25 mg total) by mouth two (2) times a day.   cholecalciferol (vitamin D3-50 mcg (2,000  unit)) 50 mcg (2,000 unit) tablet Take 1 tablet (50 mcg total) by mouth daily.   cyanocobalamin (vitamin B-12) 1000 MCG tablet Take 1 tablet (1,000 mcg total) by mouth.   ezetimibe  10 mg tablet Commonly known as: ZETIA  Take 1 tablet (10 mg total) by mouth nightly.   FLUoxetine  40 MG capsule Commonly known as: PROZAC  Take 2 capsules (80 mg total) by mouth nightly.   ondansetron  4 MG disintegrating tablet Commonly known as: ZOFRAN -ODT DISSOLVE 1 TAB UNDER TONGUE EVERY 4 HOURS AS NEEDED FOR NAUSEA/VOMIT   ONETOUCH ULTRA TEST Strp Generic drug: blood sugar diagnostic 1 each.   pantoprazole  40 MG tablet Commonly known as: Protonix  Take 1 tablet (40 mg total) by mouth daily before breakfast.   REPATHA  SURECLICK 140 mg/mL Pnij Generic drug: evolocumab  INJECT 1 ML (140 MG) SUBCUTANEOUSLY EVERY 14 DAYS   ZyrTEC  10 MG tablet Generic drug: cetirizine  Take 1 tablet (10 mg total) by mouth at bedtime.       _____________________________________  Nutrition:  ___________________________________________  Discharge Instructions   Nutrition:                                   Activity:                                    Appointments:                         Appointments which have been scheduled for you    Feb 29, 2024 2:20 PM (Arrive by 1:50 PM) NEW CARDIOLOGY with [facility] PROVIDER [facility] C[facility]OLOGY AT [facility] [place]) [address] [place] [address] [phone]         Follow Up:                                  Allergies  Allergen Reactions  . Pregabalin Other (See Comments)  . Statins-Hmg-Coa Reductase Inhibitors Other (See Comments)    REACTION: intolerance REACTION: intolerance   . Amlodipine Besylate Other (See Comments)    REACTION: intolerance REACTION: intolerance   . Clopidogrel Other (See Comments)    Reaction not recalled  . Diazepam  Other (See Comments)     Has reverse effect on patient  Has reverse effect on patient    . Ezetimibe -Simvastatin     REACTION: intolerance  . Losartan -Hydrochlorothiazide Other (See Comments)    Pt is unsure  Pt is unsure    . Metoprolol Succinate Other (See Comments)    Makes her feel weak Makes her feel weak   . Morphine Other (See Comments)    MIGRAINE  . Niacin     REACTION: intolerance  . Other Other (See Comments)    MIGRAINE  . Rosuvastatin     REACTION: intolerance  . Valsartan Other (See Comments)    Joint pain  Reaction not cited  . Aspirin Hives  . Latex Other (See Comments) and Rash  . Semaglutide Other (See Comments), Nausea And Vomiting and Nausea Only     Past Medical History[1]  Past Surgical History[2]   Family History[3]   Current Medications[4]  Imaging  CT Abdomen Pelvis W IV Contrast Result Date: 02/18/2024 Exam: CT of the Abdomen and Pelvis with Contrast  History: Abdominal pain.  Technique: After administration of intravenous contrast, contiguous axial images were obtained through the abdomen and pelvis during the portal venous phase. Multiplanar reformats were obtained. AEC (automated exposure control) and/or manual techniques such as size-specific kV and mAs are employed where appropriate to reduce radiation exposure for all CT exams.  Comparison: Radiographs 02/18/2024    Findings: LOWER CHEST: Bibasilar atelectasis. Cardiomegaly. Wall thickening distal esophagus which may indicate reflux esophagitis.  LIVER: Normal size. Smooth contour. No focal lesion. Patent hepatic vasculature.  BILIARY: Cholecystectomy. Prominence of the intrahepatic and extra hepatic biliary tree, unchanged compared with the prior study. No visualized stones within the common bile duct.  PANCREAS: Atrophic pancreas.  SPLEEN: Normal size.  ADRENALS: Normal morphology.  KIDNEYS: No hydroureteronephrosis. No renal lesion.  GASTROINTESTINAL: Unremarkable stomach. No abnormally dilated loops of small or  large bowel. There is moderate fecal material throughout the colon. There are multiple gas-filled loops of small bowel with a few scattered air-fluid levels. This is nonspecific  but may indicate mild enteritis or ileus. There is no wall thickening.  PELVIC ORGANS: Unremarkable.  Normal contour of urinary bladder and uterus.  PERITONEUM: No ascites.  LYMPH NODES: No lymphadenopathy.  VASCULATURE: Normal caliber. No significant atherosclerosis.  BONES: No aggressive lesion. No acute abnormality.  SOFT TISSUES: Normal.    1.    Multiple gas-filled loops of small bowel with a few scattered air-fluid levels. This is nonspecific but may indicate mild enteritis or ileus.  2.    Moderate fecal material throughout the colon.  3.    Wall thickening distal esophagus, likely reflux esophagitis.  4.    Cholecystectomy. Prominence of the intrahepatic and extra hepatic biliary tree, unchanged compared with the prior study.  Signed (Electronic Signature): 02/18/2024 6:23 PM Signed By: Abby Blossom, MD  XR Abdomen 2 Views and Chest 1 View Result Date: 02/18/2024 Exam: Abdomen 2 views, chest one view  History:  Nausea  Technique:  2 views of the abdomen and frontal view of the chest  Comparison:  Chest radiograph 02/10/2024 and earlier dating to 01/18/2023  Findings:  The supine views of the abdomen are limited by underpenetration. There are gas distended loops of bowel in the left abdomen which are indeterminate for colon versus dilated small bowel loops. No evidence of pneumoperitoneum. No abnormal calcifications are visualized. Dextrocurvature of the thoracolumbar spine.  Normal lung volumes. No focal consolidation or pulmonary edema. No demonstrable pleural effusion or pneumothorax. The cardiac silhouette is within normal limits. Unchanged tortuosity of the ascending aorta.    The supine views of the abdomen are limited by underpenetration. Gas-distended loops of bowel in the left abdomen are indeterminate for colon versus  dilated small bowel loops. In the setting of nausea, consider further evaluation with CT. No pneumoperitoneum. Clear lungs.    Signed (Electronic Signature): 02/18/2024 3:58 PM Signed By: Hassell Moles, MD   Lab Results   Recent Labs    02/22/24 0539  WBC 10.5  HGB 9.9*  HCT 30.5*  PLT 296   Recent Labs    02/20/24 0620 02/21/24 0434 02/22/24 0539  NA 139   < > 136  K 3.5   < > 4.1  CL 105   < > 104  CO2 24.6   < > 26.8  BUN 13   < > 14  CREATININE 1.33*   < > 1.21*  GLU 141   < > 150  CALCIUM 8.0*   < > 8.2*  MG 1.8  --   --    < > = values in this interval not displayed.   No results for input(s): CKTOTAL, CKMB, PCTCKMB, TROPONINI, EDTPNI, BNP, INR, LABPROT, APTT, DDIMER in the last 72 hours. No results for input(s): WBCUA, NITRITE, LEUKOCYTESUR, BACTERIA, RBCUA, BLOODU, GLUCOSEU, PROTEINUA, KETONESU, KETUR in the last 72 hours. No results for input(s): OPIAU, BENZU, TRICYCLIC, PCPU, AMPHU, COCAU, CANNAU, BARBU, ETOH, ACETAMIN, SALICYLATE in the last 72 hours. No results for input(s): PREGTESTUR, PREGPOC in the last 72 hours. No results for input(s): OCCULTBLD, RAPSCRN, CDIFRPCR, CDIFFNAP1, A1C, CHOL, LDL, HDL, TRIG in the last 72 hours. No results for input(s): O2SOUR, FIO2ART, PHART, PCO2ART, PO2ART, HCO3ART, O2SATART, BEART in the last 72 hours.   Home Medications   Prior to Admission medications  Medication Dose, Route, Frequency  ACCU-CHEK GUIDE ME GLUCOSE MTR Misc   albuterol  HFA 90 mcg/actuation inhaler 2 puffs, Every 6 hours PRN  blood sugar diagnostic (ONETOUCH ULTRA TEST) Strp 1 each  carvedilol  (COREG ) 25 MG tablet  25 mg, Oral, 2 times a day (standard)  cetirizine  (ZYRTEC ) 10 MG tablet 10 mg, At bedtime  cholecalciferol, vitamin D3-50 mcg, 2,000 unit,, 50 mcg (2,000 unit) tablet 50 mcg, Daily (standard)  cyanocobalamin, vitamin B-12, 1000 MCG tablet 1,000 mcg   esomeprazole  (NEXIUM ) 40 MG capsule 40 mg  ezetimibe  (ZETIA ) 10 mg tablet 10 mg, Oral, Nightly  FLUoxetine  (PROZAC ) 40 MG capsule 80 mg, Nightly  fluticasone  propion-salmeterol (ADVAIR HFA) 115-21 mcg/actuation inhaler   menthol (BIOFREEZE, MENTHOL,) 4 % Gel Apply topically.  ondansetron  (ZOFRAN -ODT) 4 MG disintegrating tablet DISSOLVE 1 TAB UNDER TONGUE EVERY 4 HOURS AS NEEDED FOR NAUSEA/VOMIT  pantoprazole  (PROTONIX ) 40 MG tablet 40 mg, Daily before breakfast  REPATHA  SURECLICK 140 mg/mL PnIj INJECT 1 ML (140 MG) SUBCUTANEOUSLY EVERY 14 DAYS  EPINEPHrine  (EPIPEN ) 0.3 mg/0.3 mL injection 0.3 mg, Intramuscular  estradiol  (VIVELLE -DOT) 0.075 mg/24 hr APPLY 1 PATCH TOPICALLY TWICE A WEEK Patient not taking: Reported on 02/18/2024  famotidine (PEPCID) 20 MG tablet 20 mg, Oral, 2 times a day (standard)  fluticasone  propionate (FLONASE ) 50 mcg/actuation nasal spray 2 sprays, Daily (standard) Patient not taking: Reported on 02/18/2024  garlic 400 mg Tab 400 mg Patient not taking: Reported on 02/18/2024  magnesium  oxide (MAG-OX) 400 mg (241.3 mg elemental magnesium ) tablet 400 mg, Oral, Daily (standard)  metoclopramide  (REGLAN ) 5 MG tablet 5 mg, Oral, 3 times a day [facility])   Chiquita CHRISTELLA Richards, MD Hospitalist, [facility] 02/22/24, 9:24 AM      [1] Past Medical History: Diagnosis Date  . Arthritis   . Asthma (HHS-HCC)   . CAP (community acquired pneumonia) 01/18/2023  . Diabetes mellitus (CMS-HCC)   . Hypertension   [2] Past Surgical History: Procedure Laterality Date  . APPENDECTOMY     40 years ago  . BREAST BIOPSY Left 11/03/2020   ultrasound bx lymph node  . BREAST CYST EXCISION Bilateral   . exploratory stomach surgery     Pt states it was 50 years ago; md thought she had a tubal pregnancy  [3] Family History Problem Relation Age of Onset  . Breast cancer Maternal Grandmother   . Breast cancer Maternal Aunt   . Breast cancer Maternal Aunt   [4]  Current Facility-Administered  Medications:  .  acetaminophen  (TYLENOL ) tablet 650 mg, 650 mg, Oral, Q4H PRN, Hugelmeyer, Alexis, DO .  albuterol  2.5 mg /3 mL (0.083 %) nebulizer solution 2.5 mg, 2.5 mg, Nebulization, Q6H PRN, Hugelmeyer, Alexis, DO .  ALPRAZolam  (XANAX ) tablet 1 mg, 1 mg, Oral, BID PRN, Richards Chiquita Jansky, MD, 1 mg at 02/20/24 1516 .  aluminum-magnesium  hydroxide-simethicone  (MAALOX MAX) 80-80-8 mg/mL oral suspension, 30 mL, Oral, Q4H PRN, Hugelmeyer, Alexis, DO, 30 mL at 02/19/24 0508 .  bisacodyl (DULCOLAX) EC tablet 10 mg, 10 mg, Oral, Daily PRN, Hugelmeyer, Alexis, DO .  carvedilol  (COREG ) tablet 25 mg, 25 mg, Oral, BID, Hugelmeyer, Alexis, DO, 25 mg at 02/21/24 2054 .  dextrose  (GLUTOSE) 40 % gel 15 g of dextrose , 15 g of dextrose , Oral, Q10 Min PRN, Hugelmeyer, Alexis, DO .  dextrose  50 % in water  (D50W) 50 % solution 12.5 g, 12.5 g, Intravenous, Q15 Min PRN, Hugelmeyer, Alexis, DO .  enoxaparin  (LOVENOX ) syringe 40 mg, 40 mg, Subcutaneous, Q24H, Hugelmeyer, Alexis, DO, 40 mg at 02/21/24 2054 .  FLUoxetine  (PROZAC ) capsule 80 mg, 80 mg, Oral, Nightly, Richards Chiquita Jansky, MD, 80 mg at 02/21/24 2054 .  glucagon injection 1 mg, 1 mg, Intramuscular, Once PRN, Hugelmeyer, Alexis, DO .  guaiFENesin  (ROBITUSSIN)  oral syrup, 200 mg, Oral, Q4H PRN, Hugelmeyer, Alexis, DO .  insulin  lispro (HumaLOG) injection CORRECTIONAL 0-20 Units, 0-20 Units, Subcutaneous, ACHS, Hugelmeyer, Alexis, DO .  magnesium  oxide (MAG-OX) tablet 400 mg, 400 mg, Oral, Daily, Heath Chiquita Jansky, MD, 400 mg at 02/21/24 0851 .  melatonin tablet 3 mg, 3 mg, Oral, Nightly PRN, Hugelmeyer, Alexis, DO, 3 mg at 02/21/24 2054 .  metoclopramide  (REGLAN ) tablet 5 mg, 5 mg, Oral, TID AC, Heath Chiquita Jansky, MD, 5 mg at 02/21/24 1547 .  morphine 4 mg/mL injection 1 mg, 1 mg, Intravenous, Q6H PRN, Hugelmeyer, Alexis, DO .  naloxone (NARCAN) injection 0.1 mg, 0.1 mg, Intravenous, Q5 Min PRN, Hugelmeyer, Alexis, DO .  ondansetron  (ZOFRAN )  injection 4 mg, 4 mg, Intravenous, Q8H PRN **OR** ondansetron  (ZOFRAN ) injection 8 mg, 8 mg, Intravenous, Q8H PRN, Hugelmeyer, Alexis, DO .  pantoprazole  (Protonix ) EC tablet 40 mg, 40 mg, Oral, Daily before breakfast, Heath Chiquita Jansky, MD .  polyethylene glycol (MIRALAX ) packet 17 g, 17 g, Oral, Daily PRN, Hugelmeyer, Alexis, DO .  traMADol  (ULTRAM ) tablet 50 mg, 50 mg, Oral, Q4H PRN, Hugelmeyer, Alexis, DO, 50 mg at 02/22/24 [phone]

## 2024-03-11 ENCOUNTER — Encounter: Payer: Self-pay | Admitting: Family

## 2024-03-11 ENCOUNTER — Ambulatory Visit: Admitting: Family

## 2024-03-11 ENCOUNTER — Other Ambulatory Visit

## 2024-03-11 DIAGNOSIS — G8929 Other chronic pain: Secondary | ICD-10-CM

## 2024-03-11 DIAGNOSIS — M25512 Pain in left shoulder: Secondary | ICD-10-CM

## 2024-03-11 NOTE — Progress Notes (Unsigned)
 Office Visit Note   Patient: Dana Romero           Date of Birth: 17-Dec-1950           MRN: [id number] Visit Date: 03/11/2024              Requested by: Feliz Margart ORN, DO [address] [place],  [place] [address] PCP: Feliz Margart ORN, DO  Chief Complaint  Patient presents with  . Left Shoulder - Pain      HPI: The patient is a 73 year old woman who presents today for evaluation of left clavicle injury.  She currently resides at [facility] in [place].  History of mobile x-ray which showed clavicle fracture.  Discussed with skilled nursing staff this morning which reports this is a old injury.  They have placed her in a sling  Difficult to obtain a history from the patient due to memory issues she has no concerns of pain  Assessment & Plan: Visit Diagnoses:  1. Chronic left shoulder pain     Plan: Discontinue sling.  May use the left upper extremity freely as tolerated  Follow-Up Instructions: No follow-ups on file.   Ortho Exam  Patient is alert, oriented, no adenopathy, well-dressed, normal affect, normal respiratory effort.     Imaging: No results found. No images are attached to the encounter.  Labs: Lab Results  Component Value Date   HGBA1C 6.7 (H) 11/29/2023   HGBA1C 8.0 (H) 07/11/2023   HGBA1C 5.7 (H) 05/26/2020   ESRSEDRATE 11 04/24/2014   LABURIC 4.4 05/26/2020   REPTSTATUS 01/19/2020 FINAL 01/16/2020   GRAMSTAIN  08/25/2008    ABUNDANT WBC PRESENT, PREDOMINANTLY PMN NO SQUAMOUS EPITHELIAL CELLS SEEN RARE GRAM POSITIVE COCCI IN PAIRS   GRAMSTAIN  08/25/2008    ABUNDANT WBC PRESENT, PREDOMINANTLY PMN NO SQUAMOUS EPITHELIAL CELLS SEEN RARE GRAM POSITIVE COCCI IN PAIRS   CULT >=100,000 COLONIES/mL ESCHERICHIA COLI (A) 01/16/2020   LABORGA ESCHERICHIA COLI (A) 01/16/2020     Lab Results  Component Value Date   ALBUMIN 3.1 (L) 12/27/2023   ALBUMIN 2.9 (L) 12/01/2023   ALBUMIN 3.4 (L) 11/29/2023    Lab Results  Component Value Date    MG 1.6 (L) 02/02/2023   MG 1.4 (L) 05/09/2012   MG 1.5 01/30/2011   Lab Results  Component Value Date   VD25OH 54 05/09/2012    No results found for: PREALBUMIN    Latest Ref Rng & Units 12/27/2023    6:35 PM 11/30/2023    4:12 AM 11/29/2023    5:00 PM  CBC EXTENDED  WBC 4.0 - 10.5 K/uL 12.4  9.2  8.8   RBC 3.87 - 5.11 MIL/uL 4.52  3.99  4.81   Hemoglobin 12.0 - 15.0 g/dL 87.0  88.7  86.3   HCT 36.0 - 46.0 % 39.2  34.6  40.6   Platelets 150 - 400 K/uL 271  208  251      There is no height or weight on file to calculate BMI.  Orders:  Orders Placed This Encounter  Procedures  . XR Shoulder Left   No orders of the defined types were placed in this encounter.    Procedures: No procedures performed  Clinical Data: No additional findings.  ROS:  All other systems negative, except as noted in the HPI. Review of Systems  Objective: Vital Signs: LMP 03/15/2014   Specialty Comments:  No specialty comments available.  PMFS History: Patient Active Problem List   Diagnosis  Date Noted  . Emesis, persistent 11/30/2023  . Intractable vomiting 11/29/2023  . Liver lesion 11/29/2023  . Hiatal hernia 07/17/2023  . Large hiatal hernia 05/17/2023  . Generalized abdominal pain 11/23/2021  . N&V (nausea and vomiting) 04/13/2021  . Dysphagia 04/13/2021  . Constipation 04/13/2021  . Abdominal pain, chronic, epigastric 04/13/2021  . Gout 05/18/2020  . Gastroesophageal reflux disease 05/18/2020  . Asthma 05/18/2020  . Nausea 05/18/2020  . Menopausal and female climacteric states 05/18/2020  . Pure hypercholesterolemia 04/18/2016  . Allergic rhinitis 05/09/2012  . Essential hypertension 07/26/2007  . DM II (diabetes mellitus, type II), controlled (HCC) 11/20/2006    Class: History of  . Anxiety state 11/20/2006  . Chronic kidney disease 11/20/2006  . Osteoarthritis 11/20/2006  . DIABETIC PERIPHERAL NEUROPATHY 02/21/2006   Past Medical History:  Diagnosis Date  .  Anxiety   . Asthma   . Caregiver stress 07/04/2011  . Chronic pain syndrome   . Degenerative disc disease, cervical   . Depression   . Dyspnea    due to hiatal hernia  . Essential hypertension 1998  . GERD (gastroesophageal reflux disease)   . Gout   . Hiatal hernia   . History of pancreatitis 06/08/2005  . History of stroke    September 2020  . N&V (nausea and vomiting) 04/13/2021  . Pelvic mass   . Pneumonia    x 2  . PONV (postoperative nausea and vomiting)   . Renal insufficiency   . Seasonal allergies   . Sinusitis, chronic   . Stroke (HCC)   . Transfusion history   . Type 2 diabetes mellitus (HCC) 1998    Family History  Problem Relation Age of Onset  . Hypertension Mother   . Depression Mother   . Stomach cancer Mother 80  . Hypertension Brother   . Pancreatic cancer Brother   . Diabetes Maternal Grandmother   . Hypertension Maternal Grandmother   . Breast cancer Maternal Grandmother   . Heart attack Maternal Grandmother   . Diabetes Maternal Aunt   . Breast cancer Maternal Aunt   . Diabetes Maternal Uncle   . Hypertension Maternal Uncle   . Hypertension Maternal Uncle   . Stroke Other   . Colon cancer Neg Hx     Past Surgical History:  Procedure Laterality Date  . APPENDECTOMY    . ARM DEBRIDEMENT Left    debridement of axilla spider bite  . BALLOON DILATION N/A 06/30/2021   Procedure: BALLOON DILATION;  Surgeon: Cindie Carlin POUR, DO;  [facility];  Service: Endoscopy;  Laterality: N/A;  . BALLOON DILATION N/A 01/18/2022   Procedure: BALLOON DILATION;  Surgeon: Cindie Carlin POUR, DO;  [facility];  Service: Endoscopy;  Laterality: N/A;  . BALLOON DILATION N/A 11/13/2022   Procedure: BALLOON DILATION;  Surgeon: Cindie Carlin POUR, DO;  [facility];  Service: Endoscopy;  Laterality: N/A;  . BIOPSY  06/30/2021   Procedure: BIOPSY;  Surgeon: Cindie Carlin POUR, DO;  [facility];  Service: Endoscopy;;  .  BIOPSY  01/18/2022   Procedure: BIOPSY;  Surgeon: Cindie Carlin POUR, DO;  [facility];  Service: Endoscopy;;  . BIOPSY  11/13/2022   Procedure: BIOPSY;  Surgeon: Cindie Carlin POUR, DO;  [facility];  Service: Endoscopy;;  . BREAST SURGERY     breast cyst x2 -last '12  . CATARACT EXTRACTION W/PHACO Left 11/25/2021   Procedure: CATARACT EXTRACTION PHACO AND INTRAOCULAR LENS  PLACEMENT (IOC);  Surgeon: Harrie Agent, MD;  Location: [facility];  Service: Ophthalmology;  Laterality: Left;  CDE 4.11  . CATARACT EXTRACTION W/PHACO Right 01/06/2022   Procedure: CATARACT EXTRACTION PHACO AND INTRAOCULAR LENS PLACEMENT (IOC);  Surgeon: Harrie Agent, MD;  Location: [facility];  Service: Ophthalmology;  Laterality: Right;  CDE 4.97  . CHOLECYSTECTOMY N/A 08/11/2015   Procedure: LAPAROSCOPIC CHOLECYSTECTOMY;  Surgeon: Herlene Righter Kinsinger, MD;  Location: [facility];  Service: General;  Laterality: N/A;  . COLONOSCOPY WITH PROPOFOL  N/A 11/13/2022   Procedure: COLONOSCOPY WITH PROPOFOL ;  Surgeon: Cindie Carlin POUR, DO;  [facility];  Service: Endoscopy;  Laterality: N/A;  8:30 am, asa 3  . ESOPHAGOGASTRODUODENOSCOPY N/A 07/17/2023   Procedure: EGD (ESOPHAGOGASTRODUODENOSCOPY);  Surgeon: Stevie, Herlene Righter, MD;  Location: [facility];  Service: General;  Laterality: N/A;  . ESOPHAGOGASTRODUODENOSCOPY (EGD) WITH PROPOFOL  N/A 06/30/2021   Surgeon: Cindie Carlin POUR, DO;   Moderate size hiatal hernia, moderate Schatzki's ring dilated, gastritis biopsied, normal examined duodenum.  Pathology with gastric antral and oxyntic mucosa with nonspecific reactive gastropathy, negative for H. pylori.  . ESOPHAGOGASTRODUODENOSCOPY (EGD) WITH PROPOFOL  N/A 01/18/2022   Procedure: ESOPHAGOGASTRODUODENOSCOPY (EGD) WITH PROPOFOL ;  Surgeon: Cindie Carlin POUR, DO;  [facility];  Service: Endoscopy;  Laterality: N/A;  1:15 pm  . ESOPHAGOGASTRODUODENOSCOPY (EGD) WITH PROPOFOL  N/A 11/13/2022   Procedure:  ESOPHAGOGASTRODUODENOSCOPY (EGD) WITH PROPOFOL ;  Surgeon: Cindie Carlin POUR, DO;  [facility];  Service: Endoscopy;  Laterality: N/A;  . ESOPHAGOGASTRODUODENOSCOPY (EGD) WITH PROPOFOL  N/A 03/13/2023   Procedure: ESOPHAGOGASTRODUODENOSCOPY (EGD) WITH PROPOFOL ;  Surgeon: Eartha Angelia Sieving, MD;  [facility];  Service: Gastroenterology;  Laterality: N/A;  945am, asa 3  . LAPAROSCOPIC INSERTION GASTROSTOMY TUBE N/A 07/17/2023   Procedure: INSERTION, GASTROSTOMY TUBE, PERCUTANEOUS;  Surgeon: Kinsinger, Herlene Righter, MD;  Location: [facility];  Service: General;  Laterality: N/A;  . POLYPECTOMY  11/13/2022   Procedure: POLYPECTOMY;  Surgeon: Cindie Carlin POUR, DO;  [facility];  Service: Endoscopy;;  . HARLEY DILATION N/A 03/13/2023   Procedure: HARLEY DILATION;  Surgeon: Eartha Angelia Sieving, MD;  [facility];  Service: Gastroenterology;  Laterality: N/A;  . TONSILLECTOMY    . TONSILLECTOMY AND ADENOIDECTOMY    . TUBAL LIGATION     post partum  . XI ROBOTIC ASSISTED HIATAL HERNIA REPAIR N/A 07/17/2023   Procedure: REPAIR, HERNIA, HIATAL, ROBOT-ASSISTED;  Surgeon: Kinsinger, Herlene Righter, MD;  Location: [facility];  Service: General;  Laterality: N/A;   Social History   Occupational History  . Occupation: Retired  Tobacco Use  . Smoking status: Never    Passive exposure: Never  . Smokeless tobacco: Never  Vaping Use  . Vaping status: Never Used  Substance and Sexual Activity  . Alcohol  use: Yes    Comment: occas  . Drug use: No  . Sexual activity: Yes    Birth control/protection: Post-menopausal

## 2024-03-13 ENCOUNTER — Encounter (INDEPENDENT_AMBULATORY_CARE_PROVIDER_SITE_OTHER): Payer: Self-pay | Admitting: Gastroenterology

## 2024-04-11 NOTE — Discharge Summary (Signed)
 ------------------------------------------------------------------------------- Attestation signed by Feliz Margart Fallow, DO at 04/14/24 1652 I was the supervising physician during time of service.  Case was discussed with APP and I agree with management as outlined below. -------------------------------------------------------------------------------   DISCHARGE SUMMARY [facility]   Discharge date:   April 11, 2024 Length of stay:    LOS: 5 days    Discharge Service:   [facility] Hospitalists Discharge Attending Physician: Brutus Franco Shank, FNP Discharge to:    To Hospice Facility Condition at Discharge:  poor Code status:                         DNR and [facility]   Hospital Course: 04/06/24 Admitted after fall out of wheelchair, right rib fracture with right superior and inferior pubic rami fracture.  Admitted for ortho consult, PT/OT evaluation.  Also nutritional consult, pt is cachectic and appears malnourished.    04/07/24 Moderate protein calorie malnutrition assessment by dietician.    04/08/24 Speech therapy saw pt due to dysphagia, recommended puree diet due to mental status.  PT/OT recommends SNF for short term rehab.    04/09/24 Speech recommends soft and bite sized diet.    04/11/24 Speech with concerns about coughing with PO intake (even water ) and aspiration; CXR confirms signs of aspiration.  PO intake minimal.  Hospice consult, deemed appropriate for inpatient Hospice, will discharge there today.   ______________________________________  Admission HPI    Patient admitted on: 04/06/2024  2:47 PM  Patient admitted by: Elspeth Blane Kay, MD    CHIEF COMPLAINT: Right hip pain   Day of admission HPI:  Dana Romero  is a 73 y.o. female with a PMH significant for hypertension, arthritis, asthma, (no longer has diabetes per patient), history of pneumonia in 2024 who presented with right hip pain secondary to fall out of stationary wheelchair 1 hour prior to  arrival   Patient states that she was in a wheelchair that was locked and her domestic partner, Lamar, told her to wait in the wheelchair so he can help her to the left seat.  By the time he finished making that statement, she had already gotten up and tried to walk to the left seat and fell onto her right hip.  No other injuries.  Some nausea but no vomiting.  No vision problems.  No head injury.   Patient was recently seen in our hospital in October and transferred to [facility].  She was released 2 weeks ago and was doing well.   Today, Robert and patient went to see a friend at a nursing home at [facility] and had a nice 1 hour visit with them.  Upon return back to patient's home, this event happened.   Workup in the ED reveals x-rays that show possible right rib fracture versus artifact and no pneumothorax.  X-ray of the hip reveals right superior and inferior pubic rami fracture.  From March 11, 2024 => x-ray of the shoulders reveals remote clavicle fracture.   Patient will be admitted to hospitalist service as inpatient for pain management and evaluation by orthopedist.  Although Dr. Heyward is not on-call today, ER physician contacted him by phone and the orthopedist will see the patient tomorrow.   On exam, patient appears somewhat cachectic and we will put in a nutritional consult for moderate protein calorie malnutrition.  BMI is 22, height 5 foot 4 inches, weight is 132.  Her face has bony prominence as  well as her clavicle.    Patient admitted on Home O2? - no Patient on home anticoagulant? -  no Patient admitted with Chronic home foley catheter? - no Foley catheter placed or replaced by another service prior to admission? - no Central Line Status: NONE   Mental Status on Admission: The patient is Alert and oriented to PERSON The patient is Alert And oriented to TIME The patient is Alert and oriented to LOCATION   Today; Pt sitting up in bed asleep.   Awakens easily, disoriented and confused.  States she doesn't feel well.  Problem List, Assessment & Plan     ASSESSMENT & PLAN (In order of descending acuity)    Dysphagia, aspiration, aspiration PNA Speech has been following the pt, concerns for aspiration due to CXR findings from 12/5, coughing with even water  intake Goals of care discussion with the pt's significant other Lamar Offer to call pt's daughter, Lamar states she has had nothing to do with her mom in years, didn't even call her on Thanksgiving, he states he is clinical research associate Hospice consult for inpatient hospice as PO intake is poor   Closed fracture of multiple pubic rami, right, initial encounter    (CMS-HCC)//fall from non-moving wheelchair We appreciate ortho consult from Dr. Heyward; WBAT to bilateral lower extremities, follow up in 2 weeks with ortho to repeat films PT/OT saw the pt Chances of pt walking again and being strong enough to benefit from short term rehab are minimal Will go to inpatient Hospice facility today      Moderate protein-calorie malnutrition (HHS-HCC) Patient appears cachectic and her facial bones are prominent as well as her clavicle Nutritional consult for evaluation and treatment of moderate protein calorie malnutrition Non-severe (Moderate) Protein-Calorie Malnutrition in the context of chronic illness (04/07/24 1315) Energy Intake: < 75% of estimated energy requirement for > or equal to 1 month Interpretation of Wt. Loss: > or equal to 7.5% x 3 month Fat Loss: Moderate Muscle Loss: Moderate Malnutrition Score: 4     HTN BP normal to high, 168/71 this morning     GERD Continued Protonix      Diabetes A1C 6.8 QID accuchecks, diabetic diet, SSI   Anticoagulation; Lovenox  Oxygen ; none IVF; stopped Code status; DNR/DNI    Incidental Findings for outpatient Follow-Up: No significant incidental findings present    Pt will discharge to [facility] facility  today.  Discharge plan discussed with the pt's significant other Lamar and with Dr. Feliz attending.     ADDITIONAL NON-ACUTE FINDINGS, OBSERVATIONS, FAMILY DISCUSSIONS, ETC. (When present):   Physical exam General; ill appearing, cachectic 73 year old female Cardiovascular; regular rhythm, rate 70s Pulmonary; lungs clear bilaterally, normal breathing effort, saturating 90s on RA Abdomen; soft, nontender, nondistended Extremities; moves all extremities, no edema Neuro; alert, oriented X 1 only (to person), no focal deficit ______________________________________  Mental Status On day of Discharge:  The patient is Alert and oriented to PERSON The patient is not Alert And oriented to TIME The patient is not Alert and oriented to LOCATION  CODE STATUS :                    DNR and DNI   An advanced care planning discussion was  had with patient and/or patient's decisions maker (documented separately).  Patient discharged on Home O2? - no Patient discharged on home anticoagulant? -  no  Foley Catheter status: None Central Line Status: NONE  Time Spent on Discharge  I spent greater than 30 minutes counseling and coordinating care for the discharge of this patient. The pt's significant other Lamar and I discussed the importance of outpatient follow-up as well as concerning signs and symptoms that would require immediate evaluation by a medical professional. The aforementioned conversation participants understand  and did show insight. I did use teachback to ensure understanding. The above participant/s is aware that not following the discussed plan, recommendations, and follow up can lead to severe negative effects on the patient's health, up to and including death.  Discharge Medications     Your Medication List     STOP taking these medications    ACCU-CHEK GUIDE ME GLUCOSE MTR Misc Generic drug: blood-glucose meter   ADVAIR HFA 115-21 mcg/actuation inhaler Generic drug:  fluticasone  propion-salmeterol   carvedilol  25 MG tablet Commonly known as: COREG    ezetimibe  10 mg tablet Commonly known as: ZETIA    magnesium  oxide 400 mg (241.3 mg elemental) tablet Commonly known as: MAG-OX   metoclopramide  5 MG tablet Commonly known as: REGLAN    ONETOUCH ULTRA TEST Strp Generic drug: blood sugar diagnostic   pantoprazole  40 MG tablet Commonly known as: Protonix    silver sulfADIAZINE 1 % cream Commonly known as: SILVADENE, SSD       START taking these medications    melatonin 3 mg Tab Take 1 tablet (3 mg total) by mouth nightly as needed.       CONTINUE taking these medications    albuterol  90 mcg/actuation inhaler Commonly known as: PROVENTIL  HFA;VENTOLIN  HFA Inhale 2 puffs every six (6) hours as needed. Used 5 days ago   FLUoxetine  40 MG capsule Commonly known as: PROZAC  Take 2 capsules (80 mg total) by mouth nightly.   HYDROcodone -acetaminophen  10-325 mg per tablet Commonly known as: NORCO 10-325 TAKE 1 TABLET BY MOUTH 4 TIMES DAILY AS NEEDED   ondansetron  4 MG disintegrating tablet Commonly known as: ZOFRAN -ODT DISSOLVE 1 TAB UNDER TONGUE EVERY 4 HOURS AS NEEDED FOR NAUSEA/VOMIT       _____________________________________  Nutrition:                                  Diet Instructions     Discharge diet (specify)     Discharge Nutrition Therapy: Regular   May eat for comfort          Non-severe (Moderate) Protein-Calorie Malnutrition in the context of chronic illness (04/07/24 1315)     Energy Intake: < 75% of estimated energy requirement for > or equal to 1 month Interpretation of Wt. Loss: > or equal to 7.5% x 3 month Fat Loss: Moderate Muscle Loss: Moderate       ___________________________________________  Discharge Instructions   Nutrition:                                  Diet Instructions     Discharge diet (specify)     Discharge Nutrition Therapy: Regular   May eat for comfort           Activity:                                   Activity Instructions     Activity as tolerated         Appointments:  Appointments which have been scheduled for you    Apr 28, 2024 1:00 PM (Arrive by 12:30 PM) NEW CARDIOLOGY with [facility] PROVIDER [facility] [place]) [address] [place] [address] [phone]         Follow Up:                              Follow Up instructions and Outpatient Referrals    Discharge instructions         Allergies  Allergen Reactions  . Pregabalin Other (See Comments)  . Statins-Hmg-Coa Reductase Inhibitors Other (See Comments)    REACTION: intolerance REACTION: intolerance   . Amlodipine Besylate Other (See Comments)    REACTION: intolerance REACTION: intolerance   . Clopidogrel Other (See Comments)    Reaction not recalled  . Diazepam  Other (See Comments)    Has reverse effect on patient  Has reverse effect on patient    . Ezetimibe -Simvastatin     REACTION: intolerance  . Losartan -Hydrochlorothiazide Other (See Comments)    Pt is unsure  Pt is unsure    . Metoprolol Succinate Other (See Comments)    Makes her feel weak Makes her feel weak   . Morphine Other (See Comments)    MIGRAINE  . Niacin     REACTION: intolerance  . Other Other (See Comments)    MIGRAINE  . Rosuvastatin     REACTION: intolerance  . Valsartan Other (See Comments)    Joint pain  Reaction not cited  . Aspirin Hives  . Latex Other (See Comments) and Rash  . Semaglutide Other (See Comments), Nausea And Vomiting and Nausea Only     Past Medical History[1]  Past Surgical History[2]   Family History[3]   Current Medications[4]  Imaging  XR Chest 2 views Result Date: 04/11/2024 Exam:  Chest Two Views  History:  73 year old with cough.  Technique:  2 views  Comparison:  04/06/2024 and 02/18/2024  Findings:  There is slightly low inspiratory lung volume. There  is confluent opacity in the right upper lobe centrally. This has increased compared prior exam. Minimal perihilar opacity is also noted throughout the right lung and minimal opacity at the left infrahilar region. The heart and mediastinum are stable. No active CHF. No pneumothorax or pleural fluid. Bony structures are unremarkable.    1.    There is interval increase in the confluent opacity in the right upper lobe centrally. There is perihilar opacity noted bilaterally, right side greater than left. This is also increased compared with prior exam. This is suggestive of multifocal pneumonia. Continued follow-up suggested to ensure resolution.  Signed (Electronic Signature): 04/11/2024 10:17 AM Signed By: Ozell JONETTA Quale, MD  MRI Brain Wo Contrast Result Date: 04/08/2024 Exam:  MRI Brain without Contrast  History:  73 year old. Stroke like symptoms.  Technique:  Complete brain MRI without IV contrast.  Comparison:  Head CT February 10, 2024. Brain MRI April 12, 2022.  Findings: Diffusion-weighted imaging reveals no evidence of acute nor of subacute intracranial ischemic injury. No mass. No pathologic extra-axial fluid accumulation. Moderate central and peripheral volume loss. Normal flow voids in the major arteries at the brain base. Dural venous sinuses are patent.  Remote small cortical infarct is present in the mid left cerebellum. Both confluent and focal regions of FLAIR and T2 hyperintense signal change are present in the periventricular, central, and peripheral white matter of  both hemispheres. This has progressed since the prior MRI. Moderate involvement of the pons.  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 Remote lacunar infarct mid left corona radiata, also unchanged.    1. No acute intracranial abnormality. 2. Small remote infarcts. Left cerebellum, both thalami, and left corona radiata. 3. Moderate white matter disease, likely ischemic and due to microvascular disease.  Signed  (Electronic Signature): 04/08/2024 5:14 PM Signed By: Fairy KATHEE Shipper, MD  XR Shoulder 3 Or More Views Right Result Date: 04/08/2024 Exam:  Right Shoulder  History:  Trauma.  Technique:  3 views  Comparison:  02/10/2024.  Findings:  No evidence of acute fracture or dislocation.  Mild osteoarthritis of the acromioclavicular joint. Glenohumeral joint maintained. Remodeling of the greater tuberosity, suggestive of chronic rotator cuff pathology.  No soft tissue swelling or mineralization.  Limited view of the lungs unremarkable.    No evidence of acute fracture.  Signed (Electronic Signature): 04/08/2024 10:18 AM Signed By: Emelia Bame, MD  CT Pelvis Wo Contrast Result Date: 04/07/2024 Exam:  CT of the Pelvis without Contrast  History:  Right hip pain status post fall. Evaluate fracture.  Technique:  Routine CT the pelvis without IV contrast. AEC (automated exposure control) and/or manual techniques such as size-specific kV and mAs are employed where appropriate to reduce radiation exposure for all CT exams.  Comparison:  Right hip radiograph same day. CT abdomen pelvis 02/18/2024.  Pelvis CT Findings:  BONES/SOFT TISSUES:  Acute, and comminuted displaced fracture right superior pubic ramus and body. There is up to 1 cm medial and 8mm displacement of the superior pubic ramus with respect to the pubic symphysis. Acute, minimally displaced fracture right inferior pubic ramus. Associated small hematoma superior to the right pubic body and anterior to the right urinary bladder.  Diffuse bone demineralization. Patchy sclerosis bilateral femoral heads, likely avascular necrosis. Mild to moderate bilateral hip osteoarthrosis. Lumbar disc degeneration and left greater than right facet arthrosis predominantly at L4-L5 and L5-S1.  PELVIC ORGANS:  Slight leftward displacement of the urinary bladder. Uterus and adnexa unremarkable. Partially imaged cholelithiasis.  VASCULAR:  Mild atherosclerosis.  LYMPH NODES:  No  adenopathy.  BOWEL/MESENTERY:  Visualized bowel and mesentery are unremarkable.     1.    Acute, comminuted and displaced fracture right superior pubic ramus and body as described. Acute, minimally displaced right inferior pubic ramus fracture. 2.    Small hematoma superior to the right pubic body and anterior to the urinary bladder. 3.    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 4.    Additional chronic findings as above.  A preliminary report was rendered by the on-call radiologist at the time of the exam.  Signed (Electronic Signature): 04/07/2024 8:09 AM Signed By: Bernardino Drones, MD  ECG 12 Lead Result Date: 04/06/2024 Normal sinus rhythm Septal infarct (cited on or before 18-Feb-2024) Abnormal ECG When compared with ECG of 18-Feb-2024 22:17, Borderline criteria for Lateral infarct are no longer present Nonspecific T wave abnormality no longer evident in Anterior leads QT has shortened No acute ST - T changes noted Confirmed by Rosamond Codding [phone]) on 04/06/2024 6:23:20 PM  XR Chest Portable Result Date: 04/06/2024 Exam:  Portable Chest  History:  Injury trunk  Technique:  Single frontal view.  Comparison:  Chest radiograph 02/18/2024 and earlier  Findings:   Kyphotic positioning. No focal consolidation. No demonstrable pleural effusion or pneumothorax. The cardiac silhouette is within normal limits. Stable prominence of the right  mediastinal contour. Possible nondisplaced right rib fracture of the approximately 6th rib versus artifact.    Possible nondisplaced right rib fracture versus artifact. No pneumothorax.  Stable prominence of the right mediastinal contour, possibly secondary to tortuous or dilated ascending aorta or accentuated by kyphotic positioning.    Signed (Electronic Signature): 04/06/2024 4:30 PM Signed By: Hassell Moles, MD  XR Hip 2 Views Right Result Date: 04/06/2024 Exam:  Right Hip   History:  Pain status post fall.   Technique:  2 views  Comparison:  None.  Findings:  There are fractures of the right superior and inferior pubic rami. The superior pubic ramus fracture extends into the pubic body. The right hip is intact. There is joint space narrowing in both hips.     Fractures of the right superior and inferior pubic rami.  Signed (Electronic Signature): 04/06/2024 4:29 PM Signed By: Toribio Salt, MD   Lab Results   Recent Labs    04/11/24 0441  WBC 11.5*  HGB 9.3*  HCT 28.3*  PLT 255   Recent Labs    04/11/24 0441  NA 143  K 3.3*  CL 109*  CO2 24.0  BUN 16  CREATININE 0.92  GLU 160  CALCIUM 8.3*  MG 1.4*   No results for input(s): CKTOTAL, CKMB, PCTCKMB, TROPONINI, EDTPNI, BNP, INR, LABPROT, APTT, DDIMER in the last 72 hours. No results for input(s): WBCUA, NITRITE, LEUKOCYTESUR, BACTERIA, RBCUA, BLOODU, GLUCOSEU, PROTEINUA, KETONESU, KETUR in the last 72 hours. No results for input(s): OPIAU, BENZU, TRICYCLIC, PCPU, AMPHU, COCAU, CANNAU, BARBU, ETOH, ACETAMIN, SALICYLATE in the last 72 hours. No results for input(s): PREGTESTUR, PREGPOC in the last 72 hours. Recent Labs    04/10/24 0441  A1C 6.8*   No results for input(s): O2SOUR, FIO2ART, PHART, PCO2ART, PO2ART, HCO3ART, O2SATART, BEART in the last 72 hours. Pending Labs     Order Current Status   Blood Culture #1 Preliminary result       Home Medications   Prior to Admission medications  Medication Dose, Route, Frequency  ACCU-CHEK GUIDE ME GLUCOSE MTR Misc   blood sugar diagnostic (ONETOUCH ULTRA TEST) Strp 1 each  ezetimibe  (ZETIA ) 10 mg tablet 10 mg, Oral, Nightly  FLUoxetine  (PROZAC ) 40 MG capsule 80 mg, Nightly  fluticasone  propion-salmeterol (ADVAIR HFA) 115-21 mcg/actuation inhaler   HYDROcodone -acetaminophen  (NORCO 10-325) 10-325 mg per tablet TAKE 1 TABLET BY MOUTH 4 TIMES DAILY AS NEEDED  magnesium  oxide (MAG-OX) 400 mg (241.3  mg elemental magnesium ) tablet 400 mg, Daily (standard)  metoclopramide  (REGLAN ) 5 MG tablet 5 mg, Oral, 3 times a day (AC)  ondansetron  (ZOFRAN -ODT) 4 MG disintegrating tablet DISSOLVE 1 TAB UNDER TONGUE EVERY 4 HOURS AS NEEDED FOR NAUSEA/VOMIT  pantoprazole  (PROTONIX ) 40 MG tablet 40 mg, Daily before breakfast  silver sulfADIAZINE (SILVADENE, SSD) 1 % cream Apply topically.  albuterol  HFA 90 mcg/actuation inhaler 2 puffs, Every 6 hours PRN  carvedilol  (COREG ) 25 MG tablet 25 mg, Oral, 2 times a day (standard)  melatonin 3 mg Tab 3 mg, Oral, Nightly PRN   Hagan E Pace, FNP Hospitalist, [facility] 04/11/24, 1:20 PM      [1] Past Medical History: Diagnosis Date  . Arthritis   . Asthma (HHS-HCC)   . CAP (community acquired pneumonia) 01/18/2023  . Diabetes mellitus (CMS-HCC)   . Hypertension   [2] Past Surgical History: Procedure Laterality Date  . APPENDECTOMY     40 years ago  . BREAST BIOPSY Left 11/03/2020   ultrasound bx lymph node  .  BREAST CYST EXCISION Bilateral   . exploratory stomach surgery     Pt states it was 50 years ago; md thought she had a tubal pregnancy  [3] Family History Problem Relation Age of Onset  . Breast cancer Maternal Grandmother   . Breast cancer Maternal Aunt   . Breast cancer Maternal Aunt   [4]  Current Facility-Administered Medications:  .  acetaminophen  (TYLENOL ) tablet 650 mg, 650 mg, Oral, Q4H PRN, Julieann Elspeth Crawley, MD .  carvedilol  (COREG ) tablet 31.25 mg, 31.25 mg, Oral, BID, Pace, Hagan Eggleston, FNP, 31.25 mg at 04/11/24 0920 .  dextrose  (GLUTOSE) 40 % gel 15 g of dextrose , 15 g of dextrose , Oral, Q10 Min PRN, Pace, Hagan Eggleston, FNP .  dextrose  50 % in water  (D50W) 50 % solution 12.5 g, 12.5 g, Intravenous, Q15 Min PRN, Pace, Hagan Eggleston, FNP .  enoxaparin  (LOVENOX ) syringe 40 mg, 40 mg, Subcutaneous, Q24H, Panwala, Naitik Dhansukh, PA, 40 mg at 04/10/24 2108 .  FLUoxetine  (PROZAC ) capsule 40 mg, 40 mg, Oral, Nightly,  Julieann Elspeth Crawley, MD, 40 mg at 04/10/24 2109 .  fluticasone  furoate-vilanterol (BREO ELLIPTA ) 100-25 mcg/dose inhaler 1 puff, 1 puff, Inhalation, Daily (RT), Julieann Elspeth Crawley, MD, 1 puff at 04/11/24 0813 .  glucagon injection 1 mg, 1 mg, Intramuscular, Once PRN, Pace, Hagan Eggleston, FNP .  hydrALAZINE  (APRESOLINE ) injection 10 mg, 10 mg, Intravenous, Q6H PRN, Panwala, Naitik Dhansukh, PA, 10 mg at 04/08/24 0844 .  insulin  lispro (HumaLOG) injection CORRECTIONAL 0-20 Units, 0-20 Units, Subcutaneous, ACHS, Elisabeth Brutus Bryant, FNP, 1 Units at 04/11/24 [phone] .  melatonin tablet 3 mg, 3 mg, Oral, Nightly PRN, Julieann Elspeth Crawley, MD .  ondansetron  (ZOFRAN ) injection 4 mg, 4 mg, Intravenous, Q8H PRN, 4 mg at 04/09/24 1301 **OR** ondansetron  (ZOFRAN ) injection 8 mg, 8 mg, Intravenous, Q8H PRN, Julieann Elspeth Crawley, MD, 8 mg at 04/09/24 1452 .  oxyCODONE  (ROXICODONE ) immediate release tablet 7.5 mg, 7.5 mg, Oral, Q8H PRN, Panwala, Naitik Dhansukh, PA, 7.5 mg at 04/11/24 9074 .  pantoprazole  (Protonix ) EC tablet 40 mg, 40 mg, Oral, Daily before breakfast, Julieann Elspeth Crawley, MD, 40 mg at 04/11/24 0920 .  polyethylene glycol (MIRALAX ) packet 17 g, 17 g, Oral, Daily PRN, Julieann Elspeth Crawley, MD .  potassium chloride  ER tablet 40 mEq, 40 mEq, Oral, Daily, Pace, Hagan Eggleston, FNP, 40 mEq at 04/11/24 [phone]

## 2024-04-11 NOTE — Consults (Signed)
 Speech Language Pathology Cognitive Linguistic Evaluation Evaluation (04/11/24 0920)   Patient Name:  Dana Romero      Medical Record Number: [id number]  Date of Birth: January 04, 1951 Sex: Female          SLP Treatment Diagnosis: Dysphagia- Oropharyngeal, Cognitive linguistic deficits, Dysphagia- Oral  Assessment  Patient presents with severe cognitive linguistic deficits during an admission of closed fracture of multiple pubic rami, right, initial counter. Patient in the past has received SLP services at [facility] to treat these deficits. To participate in cognitive linguistic evaluation, patient required frequent cues to regain attention. SLP administered the SLUMS, which the patient received a score of 9/30. This is indicative of dementia like deficits. Patient's relative strengths are in visuospatial concepts and orientation to location. Patient's most notable deficits are in immediate recall, short term memory, problem solving, and generative naming.   Based in this evaluation, SLP recommending SNF for further implementation of compensatory strategies for orientation, attention, and overall communication. SLP to continue following up to promote orientation and recall of information related to hospital stay. See updated goals below   Prognosis: Poor Barriers to Discharge: Cognitive deficits, Decreased caregiver support, Decreased safety awareness, Endurance deficits, Inability to safely perform ADLS, Severity of deficits, Motivation  Recommended Compensatory Techniques: Decreased rate of speech (speaker), Extra processing time, Orientation aids (clocks/calendars etc.), Provide extra time  Plan of Care SLP Daily Frequency: 1x per day, 3-4 days per week Planned Treatment Duration : 04/22/24  Treatment Goals: LTG 1: The patient will participate in swallowing and memory treatments to promote participate in health care outcomes Time Frame: 2 weeks STG 1.1: The patient will  tolerate thin/0 with no s/sx of aspiration   Date Established : 04/11/24 Time Frame : 1 week STG 1.2: The patient will complete oral care activities with minimal assistance  Date Established : 04/11/24 Time Frame : 1 week STG 1.3: The patient will independently recall 3 of 4 orientation categories (person, time, place, situation) Date Established : 04/11/24 Time Frame : 1 week   STG 1.4: The patient will recall one piece of information regarding her care (e.g., doctor, nurse, room number) with the use of visual reminders Date Established:  04/11/24 Time Frame: 1 week  Subjective Patient was lethargic but cooperative for today's evaluation Prior Function: Required physical assistance from others, Required assistance to manage finances, Required assistance to manage medications Lives With: Spouse Other Prior Function Comments: Husband is main caregiver Educational Status: Chartered Certified Accountant Preference: Verbal Pain: pain present, notified physician   Allergies: Pregabalin, Statins-hmg-coa reductase inhibitors, Amlodipine besylate, Clopidogrel, Diazepam , Ezetimibe -simvastatin, Losartan -hydrochlorothiazide, Metoprolol succinate, Morphine, Niacin, Other, Rosuvastatin, Valsartan, Aspirin, Latex, and Semaglutide Current Medications[1]  Patient Active Problem List   Diagnosis Date Noted  . Fall from non-moving wheelchair 04/06/2024  . Closed fracture of multiple pubic rami, right, initial encounter    (CMS-HCC) 04/06/2024  . Moderate protein-calorie malnutrition (HHS-HCC) 04/06/2024  . Muscle wasting and atrophy, not elsewhere classified, left upper arm 01/22/2024  . Muscle wasting and atrophy, not elsewhere classified, right upper arm 01/22/2024  . Other abnormalities of gait and mobility 01/22/2024  . Muscle weakness (generalized) 01/22/2024  . Unsteadiness on feet 01/22/2024  . Symptomatic menopausal or female climacteric states 01/21/2024  . Allergy 01/21/2024  . Nausea with vomiting  01/21/2024  . Cognitive communication deficit 01/21/2024  . Itching 01/21/2024  . Syncope and collapse 01/18/2024  . (HFpEF) heart failure with preserved ejection fraction (CMS-HCC) 01/18/2024  . Diabetic hypoglycemia (CMS-HCC) 01/18/2024  .  Nausea & vomiting 01/18/2024  . Frequent falls 01/15/2024  . Age-related physical debility 01/15/2024  . Anemia 01/15/2024  . High anion gap metabolic acidosis 01/15/2024  . Hypomagnesemia 12/12/2023  . Gastroparesis due to DM (CMS-HCC) 12/12/2023  . AKI (acute kidney injury) 12/08/2023  . Emesis, persistent 11/30/2023  . Intractable vomiting 11/29/2023  . Liver lesion 11/29/2023  . CKD stage 3a, GFR 45-59 ml/min (CMS-HCC) 02/21/2023  . History of CVA (cerebrovascular accident) 02/21/2023  . COVID 01/18/2023  . CAP (community acquired pneumonia) 01/18/2023  . Myofascial pain 11/30/2022  . Pain in joint of right shoulder 07/31/2022  . Therapeutic opioid induced constipation 07/31/2022  . TIA (transient ischemic attack) 04/12/2022  . Generalized abdominal pain 11/23/2021  . Abdominal pain, chronic, epigastric 04/13/2021  . Dysphagia 04/13/2021  . Dizziness 12/09/2020  . Chronic renal failure in pediatric patient, stage 3 (moderate) (CMS-HCC) 06/09/2020  . Dyslipidemia 06/09/2020  . Mild intermittent asthma without complication (HHS-HCC) 06/09/2020  . Anxiety and depression 06/09/2020  . Gastroesophageal reflux disease with esophagitis without hemorrhage 06/09/2020  . Elevated liver enzymes 06/09/2020  . Stenosis of right vertebral artery 06/09/2020  . Gastroesophageal reflux disease 05/18/2020  . Asthma (HHS-HCC) 05/18/2020  . Gout 05/18/2020  . Menopausal and female climacteric states 05/18/2020  . Long-term current use of opiate analgesic 07/05/2017  . Other chronic pain 07/05/2017  . DDD (degenerative disc disease), cervical 05/14/2017  . Pure hypercholesterolemia 04/18/2016  . Essential hypertension 07/26/2007  . Type 2 diabetes  mellitus without complication, without long-term current use of insulin  (CMS-HCC) 11/20/2006  . Osteoarthritis 11/20/2006  . Diabetic peripheral neuropathy (CMS-HCC) 02/21/2006   Past Medical History[2]   Past Surgical History[3] Social History   Tobacco Use  . Smoking status: Never    Passive exposure: Never  . Smokeless tobacco: Never  Substance Use Topics  . Alcohol  use: Not Currently   Family History[4]  General:  Medical Tests / Procedures Comments: MRI 04/08/2024: 1. No acute intracranial abnormality 2. Small remote infarcts. Left cerebellum, both thalami, and left corona radiata 3. Moderate white matter disease, likely ischemic and due to microvascular disease. Equipment/Environment: Vascular access (PIV, TLC, Port-a-cath, PICC, Foley Services patient receives prior to admission: PT, OT, SLP Precautions / Restrictions Precautions: Falls precautions Weight Bearing Status: Right lower extremity, Left lower extremity Right lower extremity: WBAT - Weight bearing as tolerated Left lower extremity: WBAT - Weight bearing as tolerated Required Braces or Orthoses: Non-applicable  Objective Swallow Status: Oropharyngeal Dysphagia Motor Speech: WNL  Oral Mechanism : Generalized weakness  Orientation: A&Ox2 (person and place) Attention: Sustained attention is limited, Complex attention is limited Memory: Memory impairment disrupts daily routines, Memory for new information is limited Safety/Judgement: Safety/Judgement is impaired for routine tasks Insight/Mental Flexibility/Initiation/Processing: Initiation is impaired, Reduced flexibility of thought is noted, Moderately disorganized, Processing speed is delayed, Perseveration is not present Verbal Expression: Repetition is impaired, Generative naming is impaired, Responsive naming is impaired Verbal Expression Comments: Very limited verbal expression Auditory Comprehension: Basic Questions Within Functional Limits, Complex  Questions impaired, Yes/No Questions Within Functional Limits, Multi-step commands impaired, Basic Conversation Comprehension is within functional limits, Complex Conversation Comprehension is impaired Pragmatics/Prosody: Topic initiation is impaired, Topic Maintenance is impaired, Prosody is impaired, Affect is impaired, Eye contact is impaired, Turn Taking is within functional limits Written Expression Comments: Did not assess Reading Comprehension: Reading ability is within functional limits Other Reading Comments: Able to read white board providing the date and who her providers are   Activity Tolerance: Patient  limited by fatigue  Speech Therapy Session Duration: 10 minutes  SLP Individual [mins]: 10 I attest that I have reviewed the above information. Signed: Con [place], SLP Filed 04/11/2024       [1] Current Facility-Administered Medications  Medication Dose Route Frequency Provider Last Rate Last Admin  . acetaminophen  (TYLENOL ) tablet 650 mg  650 mg Oral Q4H PRN Julieann Elspeth Crawley, MD      . carvedilol  (COREG ) tablet 31.25 mg  31.25 mg Oral BID Pace, Hagan Eggleston, FNP   31.25 mg at 04/11/24 0920  . dextrose  (GLUTOSE) 40 % gel 15 g of dextrose   15 g of dextrose  Oral Q10 Min PRN Pace, Hagan Eggleston, FNP      . dextrose  50 % in water  (D50W) 50 % solution 12.5 g  12.5 g Intravenous Q15 Min PRN Pace, Hagan Eggleston, FNP      . enoxaparin  (LOVENOX ) syringe 40 mg  40 mg Subcutaneous Q24H Panwala, Naitik Dhansukh, PA   40 mg at 04/10/24 2108  . FLUoxetine  (PROZAC ) capsule 40 mg  40 mg Oral Nightly Julieann Elspeth Crawley, MD   40 mg at 04/10/24 2109  . fluticasone  furoate-vilanterol (BREO ELLIPTA ) 100-25 mcg/dose inhaler 1 puff  1 puff Inhalation Daily (RT) Julieann Elspeth Crawley, MD   1 puff at 04/11/24 0813  . glucagon injection 1 mg  1 mg Intramuscular Once PRN Pace, Hagan Eggleston, FNP      . hydrALAZINE  (APRESOLINE ) injection 10 mg  10 mg Intravenous Q6H PRN Panwala, Naitik  Dhansukh, PA   10 mg at 04/08/24 0844  . insulin  lispro (HumaLOG) injection CORRECTIONAL 0-20 Units  0-20 Units Subcutaneous ACHS Pace, Hagan Eggleston, FNP   1 Units at 04/11/24 9247  . magnesium  sulfate in D5W 1 gram/100 mL infusion 1 g  1 g Intravenous Q1H Pace, Hagan Eggleston, FNP   1 g at 04/11/24 0919  . melatonin tablet 3 mg  3 mg Oral Nightly PRN Julieann Elspeth Crawley, MD      . ondansetron  (ZOFRAN ) injection 4 mg  4 mg Intravenous Q8H PRN Julieann Elspeth Crawley, MD   4 mg at 04/09/24 1301   Or  . ondansetron  (ZOFRAN ) injection 8 mg  8 mg Intravenous Q8H PRN Julieann Elspeth Crawley, MD   8 mg at 04/09/24 1452  . oxyCODONE  (ROXICODONE ) immediate release tablet 7.5 mg  7.5 mg Oral Q8H PRN Panwala, Naitik Dhansukh, PA   7.5 mg at 04/11/24 9074  . pantoprazole  (Protonix ) EC tablet 40 mg  40 mg Oral Daily before breakfast Julieann Elspeth Crawley, MD   40 mg at 04/11/24 0920  . polyethylene glycol (MIRALAX ) packet 17 g  17 g Oral Daily PRN Julieann Elspeth Crawley, MD      . potassium chloride  ER tablet 40 mEq  40 mEq Oral Daily Pace, Hagan Eggleston, FNP   40 mEq at 04/11/24 0919  [2] Past Medical History: Diagnosis Date  . Arthritis   . Asthma (HHS-HCC)   . CAP (community acquired pneumonia) 01/18/2023  . Diabetes mellitus (CMS-HCC)   . Hypertension   [3] Past Surgical History: Procedure Laterality Date  . APPENDECTOMY     40 years ago  . BREAST BIOPSY Left 11/03/2020   ultrasound bx lymph node  . BREAST CYST EXCISION Bilateral   . exploratory stomach surgery     Pt states it was 50 years ago; md thought she had a tubal pregnancy  [4] Family History Problem Relation Age of Onset  . Breast cancer Maternal Grandmother   .  Breast cancer Maternal Aunt   . Breast cancer Maternal Aunt

## 2024-04-16 ENCOUNTER — Encounter: Payer: Self-pay | Admitting: Gastroenterology

## 2024-04-16 ENCOUNTER — Ambulatory Visit: Admitting: Gastroenterology

## 2024-05-08 DEATH — deceased
